# Patient Record
Sex: Female | Born: 1935 | Race: White | Hispanic: No | Marital: Married | Smoking: Never smoker
Health system: Southern US, Community
[De-identification: ages and names within clinical notes are randomized; demographics above are authoritative.]

## PROBLEM LIST (undated history)

## (undated) DIAGNOSIS — D561 Beta thalassemia: Secondary | ICD-10-CM

## (undated) DIAGNOSIS — C189 Malignant neoplasm of colon, unspecified: Secondary | ICD-10-CM

## (undated) DIAGNOSIS — I00 Rheumatic fever without heart involvement: Secondary | ICD-10-CM

## (undated) DIAGNOSIS — C4490 Unspecified malignant neoplasm of skin, unspecified: Secondary | ICD-10-CM

## (undated) DIAGNOSIS — J189 Pneumonia, unspecified organism: Secondary | ICD-10-CM

## (undated) DIAGNOSIS — F419 Anxiety disorder, unspecified: Secondary | ICD-10-CM

## (undated) DIAGNOSIS — I219 Acute myocardial infarction, unspecified: Secondary | ICD-10-CM

## (undated) DIAGNOSIS — I700 Atherosclerosis of aorta: Secondary | ICD-10-CM

## (undated) DIAGNOSIS — E785 Hyperlipidemia, unspecified: Secondary | ICD-10-CM

## (undated) DIAGNOSIS — R001 Bradycardia, unspecified: Secondary | ICD-10-CM

## (undated) DIAGNOSIS — Z87442 Personal history of urinary calculi: Secondary | ICD-10-CM

## (undated) DIAGNOSIS — M1990 Unspecified osteoarthritis, unspecified site: Secondary | ICD-10-CM

## (undated) DIAGNOSIS — I10 Essential (primary) hypertension: Secondary | ICD-10-CM

## (undated) DIAGNOSIS — N189 Chronic kidney disease, unspecified: Secondary | ICD-10-CM

## (undated) DIAGNOSIS — C801 Malignant (primary) neoplasm, unspecified: Secondary | ICD-10-CM

## (undated) DIAGNOSIS — N7092 Oophoritis, unspecified: Secondary | ICD-10-CM

## (undated) DIAGNOSIS — M810 Age-related osteoporosis without current pathological fracture: Secondary | ICD-10-CM

## (undated) DIAGNOSIS — D649 Anemia, unspecified: Secondary | ICD-10-CM

## (undated) DIAGNOSIS — I2510 Atherosclerotic heart disease of native coronary artery without angina pectoris: Secondary | ICD-10-CM

## (undated) HISTORY — DX: Age-related osteoporosis without current pathological fracture: M81.0

## (undated) HISTORY — PX: CORONARY ARTERY BYPASS GRAFT: SHX141

## (undated) HISTORY — PX: VAGINAL HYSTERECTOMY: SUR661

## (undated) HISTORY — DX: Anxiety disorder, unspecified: F41.9

## (undated) HISTORY — DX: Hyperlipidemia, unspecified: E78.5

## (undated) HISTORY — DX: Essential (primary) hypertension: I10

## (undated) HISTORY — PX: FRACTURE SURGERY: SHX138

## (undated) HISTORY — DX: Malignant neoplasm of colon, unspecified: C18.9

---

## 2003-04-05 DIAGNOSIS — C569 Malignant neoplasm of unspecified ovary: Secondary | ICD-10-CM

## 2003-04-05 HISTORY — DX: Malignant neoplasm of unspecified ovary: C56.9

## 2004-01-03 ENCOUNTER — Ambulatory Visit: Payer: Self-pay | Admitting: Oncology

## 2004-02-03 ENCOUNTER — Ambulatory Visit: Payer: Self-pay | Admitting: Oncology

## 2004-03-04 ENCOUNTER — Ambulatory Visit: Payer: Self-pay | Admitting: Oncology

## 2004-04-04 ENCOUNTER — Ambulatory Visit: Payer: Self-pay | Admitting: Oncology

## 2004-05-05 ENCOUNTER — Ambulatory Visit: Payer: Self-pay | Admitting: Oncology

## 2004-06-02 ENCOUNTER — Ambulatory Visit: Payer: Self-pay | Admitting: Oncology

## 2004-07-28 ENCOUNTER — Ambulatory Visit: Payer: Self-pay | Admitting: Oncology

## 2004-09-01 ENCOUNTER — Ambulatory Visit: Payer: Self-pay | Admitting: Unknown Physician Specialty

## 2004-09-20 ENCOUNTER — Ambulatory Visit: Payer: Self-pay | Admitting: Oncology

## 2004-11-17 ENCOUNTER — Ambulatory Visit: Payer: Self-pay | Admitting: Oncology

## 2004-12-03 ENCOUNTER — Ambulatory Visit: Payer: Self-pay | Admitting: Oncology

## 2005-01-06 ENCOUNTER — Ambulatory Visit: Payer: Self-pay | Admitting: Family Medicine

## 2005-01-17 ENCOUNTER — Ambulatory Visit: Payer: Self-pay | Admitting: Oncology

## 2005-02-02 ENCOUNTER — Ambulatory Visit: Payer: Self-pay | Admitting: Oncology

## 2005-03-04 ENCOUNTER — Ambulatory Visit: Payer: Self-pay | Admitting: Oncology

## 2005-05-11 ENCOUNTER — Ambulatory Visit: Payer: Self-pay | Admitting: Oncology

## 2005-06-02 ENCOUNTER — Ambulatory Visit: Payer: Self-pay | Admitting: Oncology

## 2005-08-08 ENCOUNTER — Ambulatory Visit: Payer: Self-pay | Admitting: Oncology

## 2005-08-10 ENCOUNTER — Ambulatory Visit: Payer: Self-pay | Admitting: Oncology

## 2005-09-02 ENCOUNTER — Ambulatory Visit: Payer: Self-pay | Admitting: Oncology

## 2005-11-07 ENCOUNTER — Ambulatory Visit: Payer: Self-pay | Admitting: Oncology

## 2005-12-03 ENCOUNTER — Ambulatory Visit: Payer: Self-pay | Admitting: Oncology

## 2006-01-26 ENCOUNTER — Ambulatory Visit: Payer: Self-pay | Admitting: Family Medicine

## 2006-02-06 ENCOUNTER — Ambulatory Visit: Payer: Self-pay | Admitting: Oncology

## 2006-02-08 ENCOUNTER — Ambulatory Visit: Payer: Self-pay | Admitting: Oncology

## 2006-03-04 ENCOUNTER — Ambulatory Visit: Payer: Self-pay | Admitting: Oncology

## 2006-04-04 DIAGNOSIS — Z951 Presence of aortocoronary bypass graft: Secondary | ICD-10-CM

## 2006-04-04 DIAGNOSIS — I219 Acute myocardial infarction, unspecified: Secondary | ICD-10-CM

## 2006-04-04 HISTORY — DX: Presence of aortocoronary bypass graft: Z95.1

## 2006-04-04 HISTORY — DX: Acute myocardial infarction, unspecified: I21.9

## 2006-04-04 HISTORY — PX: CORONARY ARTERY BYPASS GRAFT: SHX141

## 2006-04-19 ENCOUNTER — Ambulatory Visit: Payer: Self-pay | Admitting: Oncology

## 2006-05-13 ENCOUNTER — Other Ambulatory Visit: Payer: Self-pay

## 2006-05-13 ENCOUNTER — Emergency Department: Payer: Self-pay | Admitting: Emergency Medicine

## 2006-06-12 ENCOUNTER — Ambulatory Visit: Payer: Self-pay | Admitting: Oncology

## 2006-07-04 ENCOUNTER — Ambulatory Visit: Payer: Self-pay | Admitting: Oncology

## 2006-08-14 ENCOUNTER — Other Ambulatory Visit: Payer: Self-pay

## 2006-08-14 ENCOUNTER — Inpatient Hospital Stay: Payer: Self-pay | Admitting: Internal Medicine

## 2006-08-14 DIAGNOSIS — I2510 Atherosclerotic heart disease of native coronary artery without angina pectoris: Secondary | ICD-10-CM

## 2006-08-14 DIAGNOSIS — I213 ST elevation (STEMI) myocardial infarction of unspecified site: Secondary | ICD-10-CM

## 2006-08-14 HISTORY — PX: LEFT HEART CATH AND CORONARY ANGIOGRAPHY: CATH118249

## 2006-08-14 HISTORY — PX: CORONARY ARTERY BYPASS GRAFT: SHX141

## 2006-08-14 HISTORY — DX: Atherosclerotic heart disease of native coronary artery without angina pectoris: I25.10

## 2006-08-14 HISTORY — DX: ST elevation (STEMI) myocardial infarction of unspecified site: I21.3

## 2006-09-20 ENCOUNTER — Encounter: Payer: Self-pay | Admitting: Internal Medicine

## 2006-10-03 ENCOUNTER — Encounter: Payer: Self-pay | Admitting: Internal Medicine

## 2006-10-17 ENCOUNTER — Ambulatory Visit: Payer: Self-pay | Admitting: Oncology

## 2006-11-03 ENCOUNTER — Encounter: Payer: Self-pay | Admitting: Internal Medicine

## 2006-12-04 ENCOUNTER — Encounter: Payer: Self-pay | Admitting: Internal Medicine

## 2006-12-04 ENCOUNTER — Ambulatory Visit: Payer: Self-pay | Admitting: Oncology

## 2006-12-15 ENCOUNTER — Ambulatory Visit: Payer: Self-pay | Admitting: Oncology

## 2007-01-03 ENCOUNTER — Encounter: Payer: Self-pay | Admitting: Internal Medicine

## 2007-01-03 ENCOUNTER — Ambulatory Visit: Payer: Self-pay | Admitting: Oncology

## 2007-01-29 ENCOUNTER — Ambulatory Visit: Payer: Self-pay | Admitting: Oncology

## 2007-01-30 ENCOUNTER — Ambulatory Visit: Payer: Self-pay | Admitting: Oncology

## 2007-02-03 ENCOUNTER — Ambulatory Visit: Payer: Self-pay | Admitting: Oncology

## 2007-04-17 ENCOUNTER — Ambulatory Visit: Payer: Self-pay | Admitting: Oncology

## 2007-05-06 ENCOUNTER — Ambulatory Visit: Payer: Self-pay | Admitting: Oncology

## 2007-06-03 ENCOUNTER — Ambulatory Visit: Payer: Self-pay | Admitting: Oncology

## 2007-06-04 ENCOUNTER — Ambulatory Visit: Payer: Self-pay | Admitting: Oncology

## 2007-07-04 ENCOUNTER — Ambulatory Visit: Payer: Self-pay | Admitting: Oncology

## 2007-08-03 ENCOUNTER — Ambulatory Visit: Payer: Self-pay | Admitting: Oncology

## 2007-08-07 ENCOUNTER — Ambulatory Visit: Payer: Self-pay | Admitting: Nurse Practitioner

## 2007-09-03 ENCOUNTER — Ambulatory Visit: Payer: Self-pay | Admitting: Oncology

## 2007-10-03 ENCOUNTER — Ambulatory Visit: Payer: Self-pay | Admitting: Oncology

## 2007-10-09 ENCOUNTER — Ambulatory Visit: Payer: Self-pay | Admitting: Oncology

## 2007-11-03 ENCOUNTER — Ambulatory Visit: Payer: Self-pay | Admitting: Oncology

## 2007-12-31 ENCOUNTER — Ambulatory Visit: Payer: Self-pay | Admitting: Oncology

## 2008-01-03 ENCOUNTER — Ambulatory Visit: Payer: Self-pay | Admitting: Oncology

## 2008-02-03 ENCOUNTER — Ambulatory Visit: Payer: Self-pay | Admitting: Oncology

## 2008-04-08 ENCOUNTER — Ambulatory Visit: Payer: Self-pay | Admitting: Oncology

## 2008-05-05 ENCOUNTER — Ambulatory Visit: Payer: Self-pay | Admitting: Oncology

## 2008-05-05 ENCOUNTER — Ambulatory Visit: Payer: Self-pay | Admitting: Gynecologic Oncology

## 2008-09-02 ENCOUNTER — Ambulatory Visit: Payer: Self-pay | Admitting: Oncology

## 2008-09-23 ENCOUNTER — Ambulatory Visit: Payer: Self-pay | Admitting: Oncology

## 2008-10-02 ENCOUNTER — Ambulatory Visit: Payer: Self-pay | Admitting: Oncology

## 2008-12-03 ENCOUNTER — Ambulatory Visit: Payer: Self-pay | Admitting: Gynecologic Oncology

## 2008-12-22 ENCOUNTER — Ambulatory Visit: Payer: Self-pay | Admitting: Gynecologic Oncology

## 2009-01-02 ENCOUNTER — Ambulatory Visit: Payer: Self-pay | Admitting: Gynecologic Oncology

## 2009-02-05 ENCOUNTER — Ambulatory Visit: Payer: Self-pay | Admitting: Family Medicine

## 2009-02-05 ENCOUNTER — Ambulatory Visit: Payer: Self-pay | Admitting: Oncology

## 2009-03-04 ENCOUNTER — Ambulatory Visit: Payer: Self-pay | Admitting: Oncology

## 2009-04-04 ENCOUNTER — Ambulatory Visit: Payer: Self-pay | Admitting: Oncology

## 2009-04-04 HISTORY — PX: COLONOSCOPY: SHX174

## 2009-05-20 ENCOUNTER — Ambulatory Visit: Payer: Self-pay | Admitting: Ophthalmology

## 2009-06-02 ENCOUNTER — Ambulatory Visit: Payer: Self-pay | Admitting: Ophthalmology

## 2009-07-22 ENCOUNTER — Ambulatory Visit: Payer: Self-pay | Admitting: Ophthalmology

## 2009-08-04 ENCOUNTER — Ambulatory Visit: Payer: Self-pay | Admitting: Ophthalmology

## 2009-09-08 ENCOUNTER — Ambulatory Visit: Payer: Self-pay | Admitting: Oncology

## 2009-10-02 ENCOUNTER — Ambulatory Visit: Payer: Self-pay | Admitting: Oncology

## 2009-12-03 ENCOUNTER — Ambulatory Visit: Payer: Self-pay | Admitting: Gynecologic Oncology

## 2009-12-14 ENCOUNTER — Emergency Department: Payer: Self-pay | Admitting: Unknown Physician Specialty

## 2009-12-22 ENCOUNTER — Ambulatory Visit: Payer: Self-pay | Admitting: Gynecologic Oncology

## 2010-01-02 ENCOUNTER — Ambulatory Visit: Payer: Self-pay | Admitting: Gynecologic Oncology

## 2010-03-22 ENCOUNTER — Ambulatory Visit: Payer: Self-pay | Admitting: Oncology

## 2010-03-23 ENCOUNTER — Ambulatory Visit: Payer: Self-pay | Admitting: Gynecologic Oncology

## 2010-04-04 ENCOUNTER — Ambulatory Visit: Payer: Self-pay | Admitting: Gynecologic Oncology

## 2010-09-08 ENCOUNTER — Ambulatory Visit: Payer: Self-pay | Admitting: Oncology

## 2010-09-09 LAB — CA 125: CA 125: 27.7 U/mL (ref 0.0–34.0)

## 2010-10-03 ENCOUNTER — Ambulatory Visit: Payer: Self-pay | Admitting: Oncology

## 2010-12-14 ENCOUNTER — Ambulatory Visit: Payer: Self-pay | Admitting: Family Medicine

## 2011-03-22 ENCOUNTER — Ambulatory Visit: Payer: Self-pay | Admitting: Oncology

## 2011-03-24 LAB — CA 125: CA 125: 25.2 U/mL (ref 0.0–34.0)

## 2011-04-05 ENCOUNTER — Ambulatory Visit: Payer: Self-pay | Admitting: Oncology

## 2011-09-16 ENCOUNTER — Ambulatory Visit: Payer: Self-pay | Admitting: Oncology

## 2011-09-23 LAB — COMPREHENSIVE METABOLIC PANEL
Albumin: 3.6 g/dL (ref 3.4–5.0)
Anion Gap: 7 (ref 7–16)
Calcium, Total: 9.4 mg/dL (ref 8.5–10.1)
Chloride: 106 mmol/L (ref 98–107)
Co2: 28 mmol/L (ref 21–32)
Creatinine: 1.04 mg/dL (ref 0.60–1.30)
EGFR (African American): >60
EGFR (Non-African Amer.): 53 — ABNORMAL LOW
Glucose: 101 mg/dL — ABNORMAL HIGH (ref 65–99)
Osmolality: 284 (ref 275–301)
Potassium: 4.5 mmol/L (ref 3.5–5.1)
SGOT(AST): 17 U/L (ref 15–37)
SGPT (ALT): 24 U/L
Sodium: 141 mmol/L (ref 136–145)
Total Protein: 7.6 g/dL (ref 6.4–8.2)

## 2011-09-23 LAB — CBC CANCER CENTER
Basophil %: 0.8 %
Eosinophil #: 0.2 x10 3/mm (ref 0.0–0.7)
Eosinophil %: 2.5 %
HGB: 11.5 g/dL — ABNORMAL LOW (ref 12.0–16.0)
Lymphocyte %: 25.2 %
MCHC: 30.7 g/dL — ABNORMAL LOW (ref 32.0–36.0)
Monocyte #: 1 x10 3/mm — ABNORMAL HIGH (ref 0.2–0.9)
Neutrophil #: 5.9 x10 3/mm (ref 1.4–6.5)
Neutrophil %: 61.6 %
Platelet: 220 x10 3/mm (ref 150–440)
RBC: 5.53 10*6/uL — ABNORMAL HIGH (ref 3.80–5.20)
WBC: 9.6 x10 3/mm (ref 3.6–11.0)

## 2011-09-24 LAB — CA 125: CA 125: 24.6 U/mL (ref 0.0–34.0)

## 2011-10-03 ENCOUNTER — Ambulatory Visit: Payer: Self-pay | Admitting: Oncology

## 2012-01-12 ENCOUNTER — Ambulatory Visit: Payer: Self-pay | Admitting: Family Medicine

## 2012-03-20 ENCOUNTER — Ambulatory Visit: Payer: Self-pay | Admitting: Oncology

## 2012-03-20 LAB — CBC CANCER CENTER
Basophil #: 0.1 x10 3/mm (ref 0.0–0.1)
Eosinophil #: 0.2 x10 3/mm (ref 0.0–0.7)
HCT: 37.1 % (ref 35.0–47.0)
HGB: 11.7 g/dL — ABNORMAL LOW (ref 12.0–16.0)
Lymphocyte #: 2.5 x10 3/mm (ref 1.0–3.6)
Lymphocyte %: 26.4 %
MCH: 21.5 pg — ABNORMAL LOW (ref 26.0–34.0)
MCHC: 31.6 g/dL — ABNORMAL LOW (ref 32.0–36.0)
MCV: 68 fL — ABNORMAL LOW (ref 80–100)
Monocyte %: 10.6 %
Platelet: 223 x10 3/mm (ref 150–440)
RDW: 16.3 % — ABNORMAL HIGH (ref 11.5–14.5)

## 2012-03-20 LAB — COMPREHENSIVE METABOLIC PANEL
Anion Gap: 9 (ref 7–16)
Bilirubin,Total: 0.6 mg/dL (ref 0.2–1.0)
Calcium, Total: 9 mg/dL (ref 8.5–10.1)
Chloride: 106 mmol/L (ref 98–107)
Co2: 29 mmol/L (ref 21–32)
Creatinine: 1.01 mg/dL (ref 0.60–1.30)
EGFR (African American): >60
EGFR (Non-African Amer.): 55 — ABNORMAL LOW
SGPT (ALT): 30 U/L (ref 12–78)
Sodium: 144 mmol/L (ref 136–145)

## 2012-03-22 LAB — CA 125: CA 125: 24.8 U/mL (ref 0.0–34.0)

## 2012-04-04 ENCOUNTER — Ambulatory Visit: Payer: Self-pay | Admitting: Oncology

## 2012-06-20 ENCOUNTER — Ambulatory Visit: Payer: Self-pay

## 2012-06-22 LAB — CBC CANCER CENTER
Basophil %: 0.8 %
HCT: 38.2 % (ref 35.0–47.0)
HGB: 11.5 g/dL — ABNORMAL LOW (ref 12.0–16.0)
Lymphocyte %: 27.7 %
MCHC: 30.2 g/dL — ABNORMAL LOW (ref 32.0–36.0)
MCV: 66 fL — ABNORMAL LOW (ref 80–100)
Monocyte #: 0.6 x10 3/mm (ref 0.2–0.9)
Monocyte %: 6.6 %
RBC: 5.79 10*6/uL — ABNORMAL HIGH (ref 3.80–5.20)
RDW: 16.3 % — ABNORMAL HIGH (ref 11.5–14.5)

## 2012-06-22 LAB — COMPREHENSIVE METABOLIC PANEL
Albumin: 3.4 g/dL (ref 3.4–5.0)
Alkaline Phosphatase: 98 U/L (ref 50–136)
Anion Gap: 10 (ref 7–16)
BUN: 16 mg/dL (ref 7–18)
Calcium, Total: 9.2 mg/dL (ref 8.5–10.1)
Chloride: 103 mmol/L (ref 98–107)
Co2: 30 mmol/L (ref 21–32)
Creatinine: 1.19 mg/dL (ref 0.60–1.30)
EGFR (Non-African Amer.): 45 — ABNORMAL LOW
Potassium: 4.4 mmol/L (ref 3.5–5.1)
Sodium: 143 mmol/L (ref 136–145)
Total Protein: 7.4 g/dL (ref 6.4–8.2)

## 2012-06-23 LAB — CA 125: CA 125: 28.5 U/mL (ref 0.0–34.0)

## 2012-07-03 ENCOUNTER — Ambulatory Visit: Payer: Self-pay | Admitting: Oncology

## 2012-07-03 ENCOUNTER — Ambulatory Visit: Payer: Self-pay

## 2012-11-01 ENCOUNTER — Ambulatory Visit: Payer: Self-pay | Admitting: Family Medicine

## 2013-03-26 ENCOUNTER — Ambulatory Visit: Payer: Self-pay | Admitting: Oncology

## 2013-04-04 ENCOUNTER — Ambulatory Visit: Payer: Self-pay | Admitting: Oncology

## 2014-02-19 ENCOUNTER — Ambulatory Visit: Payer: Self-pay | Admitting: Family Medicine

## 2014-05-07 ENCOUNTER — Ambulatory Visit: Payer: Self-pay | Admitting: Oncology

## 2014-06-03 ENCOUNTER — Ambulatory Visit
Admit: 2014-06-03 | Disposition: A | Discharge status: Home / Self Care | Payer: Self-pay | Attending: Oncology | Admitting: Oncology

## 2014-08-11 ENCOUNTER — Encounter: Payer: Self-pay | Admitting: Emergency Medicine

## 2014-08-11 ENCOUNTER — Emergency Department: Payer: Medicare Other

## 2014-08-11 ENCOUNTER — Observation Stay
Admission: EM | Admit: 2014-08-11 | Discharge: 2014-08-12 | Disposition: A | Discharge status: Home / Self Care | Payer: Medicare Other | Attending: Specialist | Admitting: Specialist

## 2014-08-11 DIAGNOSIS — Z951 Presence of aortocoronary bypass graft: Secondary | ICD-10-CM | POA: Diagnosis not present

## 2014-08-11 DIAGNOSIS — I25810 Atherosclerosis of coronary artery bypass graft(s) without angina pectoris: Secondary | ICD-10-CM | POA: Diagnosis not present

## 2014-08-11 DIAGNOSIS — R9431 Abnormal electrocardiogram [ECG] [EKG]: Secondary | ICD-10-CM | POA: Diagnosis present

## 2014-08-11 DIAGNOSIS — Z8543 Personal history of malignant neoplasm of ovary: Secondary | ICD-10-CM | POA: Diagnosis not present

## 2014-08-11 DIAGNOSIS — Z79899 Other long term (current) drug therapy: Secondary | ICD-10-CM | POA: Diagnosis not present

## 2014-08-11 DIAGNOSIS — I10 Essential (primary) hypertension: Secondary | ICD-10-CM | POA: Diagnosis not present

## 2014-08-11 DIAGNOSIS — E860 Dehydration: Secondary | ICD-10-CM | POA: Diagnosis not present

## 2014-08-11 DIAGNOSIS — R079 Chest pain, unspecified: Secondary | ICD-10-CM | POA: Diagnosis present

## 2014-08-11 DIAGNOSIS — I252 Old myocardial infarction: Secondary | ICD-10-CM | POA: Insufficient documentation

## 2014-08-11 DIAGNOSIS — Z7982 Long term (current) use of aspirin: Secondary | ICD-10-CM | POA: Diagnosis not present

## 2014-08-11 DIAGNOSIS — Z66 Do not resuscitate: Secondary | ICD-10-CM | POA: Diagnosis not present

## 2014-08-11 HISTORY — DX: Acute myocardial infarction, unspecified: I21.9

## 2014-08-11 HISTORY — DX: Rheumatic fever without heart involvement: I00

## 2014-08-11 HISTORY — DX: Malignant (primary) neoplasm, unspecified: C80.1

## 2014-08-11 LAB — CBC
HCT: 38.5 % (ref 35.0–47.0)
HEMOGLOBIN: 11.9 g/dL — AB (ref 12.0–16.0)
MCH: 21.3 pg — ABNORMAL LOW (ref 26.0–34.0)
MCHC: 30.9 g/dL — AB (ref 32.0–36.0)
MCV: 68.7 fL — ABNORMAL LOW (ref 80.0–100.0)
PLATELETS: 253 10*3/uL (ref 150–440)
RBC: 5.6 MIL/uL — ABNORMAL HIGH (ref 3.80–5.20)
RDW: 16.1 % — ABNORMAL HIGH (ref 11.5–14.5)
WBC: 11.4 10*3/uL — ABNORMAL HIGH (ref 3.6–11.0)

## 2014-08-11 LAB — BASIC METABOLIC PANEL
ANION GAP: 9 (ref 5–15)
BUN: 21 mg/dL — AB (ref 6–20)
CO2: 28 mmol/L (ref 22–32)
CREATININE: 0.97 mg/dL (ref 0.44–1.00)
Calcium: 9.7 mg/dL (ref 8.9–10.3)
Chloride: 103 mmol/L (ref 101–111)
GFR, EST NON AFRICAN AMERICAN: 55 mL/min — AB (ref >60)
Glucose, Bld: 165 mg/dL — ABNORMAL HIGH (ref 65–99)
POTASSIUM: 4.5 mmol/L (ref 3.5–5.1)
Sodium: 140 mmol/L (ref 135–145)

## 2014-08-11 LAB — CREATININE, SERUM
Creatinine, Ser: 0.87 mg/dL (ref 0.44–1.00)
GFR calc Af Amer: >60 mL/min (ref >60)
GFR calc non Af Amer: >60 mL/min (ref >60)

## 2014-08-11 LAB — TROPONIN I
Troponin I: <0.03 ng/mL (ref <0.031)
Troponin I: <0.03 ng/mL (ref <0.031)

## 2014-08-11 MED ORDER — ACETAMINOPHEN 650 MG RE SUPP: 650.0000 mg | Freq: Four times a day (QID) | RECTAL | Status: DC | PRN

## 2014-08-11 MED ORDER — METOPROLOL TARTRATE 25 MG PO TABS
ORAL_TABLET | ORAL | Status: AC
  Filled 2014-08-11: qty 1

## 2014-08-11 MED ORDER — LISINOPRIL 10 MG PO TABS
10.0000 mg | ORAL_TABLET | Freq: Every day | ORAL | Status: DC
  Administered 2014-08-11: 10 mg via ORAL

## 2014-08-11 MED ORDER — ONDANSETRON HCL 4 MG PO TABS: 4.0000 mg | ORAL_TABLET | Freq: Four times a day (QID) | ORAL | Status: DC | PRN

## 2014-08-11 MED ORDER — ATORVASTATIN CALCIUM 20 MG PO TABS
80.0000 mg | ORAL_TABLET | Freq: Every day | ORAL | Status: DC
  Administered 2014-08-12: 80 mg via ORAL
  Filled 2014-08-11: qty 4

## 2014-08-11 MED ORDER — METOPROLOL TARTRATE 25 MG PO TABS
25.0000 mg | ORAL_TABLET | Freq: Two times a day (BID) | ORAL | Status: DC
  Administered 2014-08-11: 25 mg via ORAL

## 2014-08-11 MED ORDER — ASPIRIN 81 MG PO CHEW
324.0000 mg | CHEWABLE_TABLET | Freq: Once | ORAL | Status: AC
  Administered 2014-08-11: 324 mg via ORAL

## 2014-08-11 MED ORDER — ONDANSETRON HCL 4 MG/2ML IJ SOLN: 4.0000 mg | Freq: Four times a day (QID) | INTRAMUSCULAR | Status: DC | PRN

## 2014-08-11 MED ORDER — LISINOPRIL 20 MG PO TABS
10.0000 mg | ORAL_TABLET | Freq: Once | ORAL | Status: AC
  Administered 2014-08-11: 10 mg via ORAL

## 2014-08-11 MED ORDER — LISINOPRIL 20 MG PO TABS
ORAL_TABLET | ORAL | Status: AC
  Administered 2014-08-11: 10 mg via ORAL
  Filled 2014-08-11: qty 1

## 2014-08-11 MED ORDER — ASPIRIN 81 MG PO CHEW
CHEWABLE_TABLET | ORAL | Status: AC
  Administered 2014-08-11: 324 mg via ORAL
  Filled 2014-08-11: qty 4

## 2014-08-11 MED ORDER — ASPIRIN 325 MG PO TABS
325.0000 mg | ORAL_TABLET | Freq: Every day | ORAL | Status: DC
  Filled 2014-08-11: qty 1

## 2014-08-11 MED ORDER — ALBUTEROL SULFATE (2.5 MG/3ML) 0.083% IN NEBU: 2.5000 mg | INHALATION_SOLUTION | RESPIRATORY_TRACT | Status: DC | PRN

## 2014-08-11 MED ORDER — ASPIRIN EC 81 MG PO TBEC: 81.0000 mg | DELAYED_RELEASE_TABLET | Freq: Every day | ORAL | Status: DC

## 2014-08-11 MED ORDER — HEPARIN SODIUM (PORCINE) 5000 UNIT/ML IJ SOLN
5000.0000 [IU] | Freq: Three times a day (TID) | INTRAMUSCULAR | Status: DC
  Administered 2014-08-11 – 2014-08-12 (×2): 5000 [IU] via SUBCUTANEOUS
  Filled 2014-08-11 (×2): qty 1

## 2014-08-11 MED ORDER — ACETAMINOPHEN 325 MG PO TABS: 650.0000 mg | ORAL_TABLET | Freq: Four times a day (QID) | ORAL | Status: DC | PRN

## 2014-08-11 MED ORDER — HYDRALAZINE HCL 20 MG/ML IJ SOLN
10.0000 mg | INTRAMUSCULAR | Status: DC | PRN
  Filled 2014-08-11: qty 1

## 2014-08-11 MED ORDER — LISINOPRIL 10 MG PO TABS
10.0000 mg | ORAL_TABLET | Freq: Every day | ORAL | Status: DC
  Administered 2014-08-12: 10 mg via ORAL
  Filled 2014-08-11: qty 1

## 2014-08-11 MED ORDER — LISINOPRIL 20 MG PO TABS
ORAL_TABLET | ORAL | Status: AC
  Filled 2014-08-11: qty 1

## 2014-08-11 MED ORDER — SIMVASTATIN 40 MG PO TABS: 40.0000 mg | ORAL_TABLET | Freq: Every day | ORAL | Status: DC

## 2014-08-11 NOTE — H&P (Signed)
[facility] [place] NAME: Phyllis Ross    MR#:  [id number]  DATE OF BIRTH:  October 18, 1935  DATE OF ADMISSION:  08/11/2014  PRIMARY CARE PHYSICIAN: Otilio Miu, MD   REQUESTING/REFERRING PHYSICIAN: Dr. Jacqualine Code.  CHIEF COMPLAINT:   Chief Complaint  Patient presents with  . Chest Pain    chest pain began 11am while playing pickle ball, continues since    HISTORY OF PRESENT ILLNESS:  Phyllis Ross  is a 79 y.o. female with a known history of hypertension, CAD status post CABG, ovarian cancer. Patient was sent to the ED due to chest pain today. Patient started to have chest pain will playing picckle ball this morning. The chest pain was aching without radiation and lasted about 20 minutes. The patient denies any headache, nausea, vomiting or diaphoresis. The patient has high blood pressure more than 200, was treated with the aspirin 324 mg 1 dose and lisinopril 10 mg 1 dose in the ED. The patient denies any other symptoms.  PAST MEDICAL HISTORY:   Past Medical History  Diagnosis Date  . Cancer   . MI (myocardial infarction)   . Rheumatic fever   ovarian cancer.   PAST SURGICAL HISTORY:   Past Surgical History  Procedure Laterality Date  . Coronary artery bypass graft     ovarian cancer surgery.  SOCIAL HISTORY:   History  Substance Use Topics  . Smoking status: Never Smoker   . Smokeless tobacco: Not on file  . Alcohol Use: No    FAMILY HISTORY:  No family history on file.  The patient and mother and sister has a heart disease, her mother had a heart attack.  DRUG ALLERGIES:  No Known Allergies  REVIEW OF SYSTEMS:  CONSTITUTIONAL: No fever, fatigue or weakness.  EYES: No blurred or double vision.  EARS, NOSE, AND THROAT: No tinnitus or ear pain.  RESPIRATORY: No cough, shortness of breath, wheezing or hemoptysis.  CARDIOVASCULAR:  positive for chest pain, orthopnea, edema.  GASTROINTESTINAL: No nausea,  vomiting, diarrhea or abdominal pain.  GENITOURINARY: No dysuria, hematuria.  ENDOCRINE: No polyuria, nocturia,  HEMATOLOGY: No anemia, easy bruising or bleeding SKIN: No rash or lesion. MUSCULOSKELETAL: No joint pain or arthritis.   NEUROLOGIC: No tingling, numbness, weakness.  PSYCHIATRY: No anxiety or depression.   MEDICATIONS AT HOME:   Prior to Admission medications   Medication Sig Start Date End Date Taking? Authorizing Provider  aspirin EC 81 MG tablet Take 81 mg by mouth daily.   Yes Historical Provider, MD  atorvastatin (LIPITOR) 80 MG tablet Take 80 mg by mouth daily.   Yes Historical Provider, MD  lisinopril (PRINIVIL,ZESTRIL) 10 MG tablet Take 10 mg by mouth daily.   Yes Historical Provider, MD  metoprolol tartrate (LOPRESSOR) 25 MG tablet Take 25 mg by mouth 2 (two) times daily.   Yes Historical Provider, MD      VITAL SIGNS:  Blood pressure 214/82, pulse 56, temperature 97.4 F (36.3 C), resp. rate 18, height 5\' 6"  (1.676 m), weight 61.236 kg (135 lb), SpO2 95 %.  PHYSICAL EXAMINATION:  GENERAL:  79 y.o.-year-old patient lying in the bed with no acute distress.  EYES: Pupils equal, round, reactive to light and accommodation. No scleral icterus. Extraocular muscles intact.  HEENT: Head atraumatic, normocephalic. Oropharynx and nasopharynx clear.  NECK:  Supple, no jugular venous distention. No thyroid enlargement, no tenderness.  LUNGS: Normal breath sounds bilaterally, no wheezing, rales,rhonchi or crepitation. No use of  accessory muscles of respiration.  CARDIOVASCULAR: S1, S2 normal. No murmurs, rubs, or gallops.  ABDOMEN: Soft, nontender, nondistended. Bowel sounds present. No organomegaly or mass.  EXTREMITIES: No pedal edema, cyanosis, or clubbing.  NEUROLOGIC: Cranial nerves II through XII are intact. Muscle strength 5/5 in all extremities. Sensation intact. Gait not checked.  PSYCHIATRIC: The patient is alert and oriented x 3.  SKIN: No obvious rash, lesion,  or ulcer.   LABORATORY PANEL:   CBC No results for input(s): WBC, HGB, HCT, PLT in the last 168 hours. ------------------------------------------------------------------------------------------------------------------  Chemistries   Recent Labs Lab 08/11/14 1430  NA 140  K 4.5  CL 103  CO2 28  GLUCOSE 165*  BUN 21*  CREATININE 0.97  CALCIUM 9.7   ------------------------------------------------------------------------------------------------------------------  Cardiac Enzymes  Recent Labs Lab 08/11/14 1430  TROPONINI <0.03   ------------------------------------------------------------------------------------------------------------------  RADIOLOGY:  No results found.  EKG:   Orders placed or performed during the hospital encounter of 08/11/14  . ED EKG (<29mins upon arrival to the ED)  . ED EKG (<28mins upon arrival to the ED)    IMPRESSION AND PLAN:   Malignant hypertension. The patient will be placed for observation. I will start IV hydralazine in when necessary, continue lisinopril and Lopressor, and adjust the dose depending on patient's blood pressure.  Chest pain and a history of CAD. The chest pain is possibly due to malignant hypertension. Patient first troponin level is negative, I will check a second troponin level and continue aspirin and atorvastatin. Mild dehydration. Encouraged patient to take oral fluids intake.     All the records are reviewed and case discussed with ED provider. Management plans discussed with the patient, family and they are in agreement.  CODE STATUS: Full code.  TOTAL TIME TAKING CARE OF THIS PATIENT: 56 minutes.    Demetrios Loll M.D on 08/11/2014 at 7:18 PM  Between 7am to 6pm - Pager - [phone]  After 6pm go to [email or web address] - password [email or web address] [email or web address]  [facility]  Office  [phone]  CC: Primary care physician; Otilio Miu, MD

## 2014-08-11 NOTE — ED Notes (Signed)
Pt states she was playing pickle ball earlier today and began to feel pain in left chest that radiated toward her arm and back. States she has hx of MI in 2008 and this pain is different. Pt states she is not experiencing pain at this time and that pain is intermittent.

## 2014-08-11 NOTE — ED Notes (Signed)
Chest pain began at 11am while playing pickle ball, continued to play 2 games and when pain did not subside stopped by fire department, they evaluated her and sent her by private car

## 2014-08-11 NOTE — ED Provider Notes (Signed)
[facility] Emergency Department Provider Note  ____________________________________________  Time seen: Approximately 5:43 PM  I have reviewed the triage vital signs and the nursing notes.   HISTORY  Chief Complaint Chest Pain    HPI Phyllis Ross is a 79 y.o. female who developed chest pain after playing one hour of pickle ball today. She developed an achy, pressure in her left chest. It was moderate in intensity and nonradiating, it improved after approximately 20 minutes of rest. She currently states she has a very mild sensation of pressure in the left chest, but it is much improved. Sudden onset. No recent surgeries. Patient has had a history of coronary bypass several years ago.  There is no associated nausea, vomiting, diaphoresis, or shortness of breath.  She has not taken her lisinopril yet today, and has not taken any aspirin.  Past Medical History  Diagnosis Date  . Cancer   . MI (myocardial infarction)   . Rheumatic fever     There are no active problems to display for this patient.   Past Surgical History  Procedure Laterality Date  . Coronary artery bypass graft      No current outpatient prescriptions on file.  Allergies Review of patient's allergies indicates no known allergies.  No family history on file.  Social History History  Substance Use Topics  . Smoking status: Never Smoker   . Smokeless tobacco: Not on file  . Alcohol Use: No    Review of Systems Constitutional: No fever/chills Eyes: No visual changes. ENT: No sore throat. Cardiovascular: See history of present illness Respiratory: Denies shortness of breath. Gastrointestinal: No abdominal pain.  No nausea, no vomiting.  No diarrhea.  No constipation. Genitourinary: Negative for dysuria. Musculoskeletal: Negative for back pain. Skin: Negative for rash. Neurological: Negative for headaches, focal weakness or numbness.  10-point ROS otherwise  negative.  ____________________________________________   PHYSICAL EXAM:  VITAL SIGNS: ED Triage Vitals  Enc Vitals Group     BP 08/11/14 1425 206/73 mmHg     Pulse Rate 08/11/14 1425 56     Resp 08/11/14 1425 18     Temp 08/11/14 1425 97.4 F (36.3 C)     Temp src --      SpO2 08/11/14 1425 95 %     Weight 08/11/14 1425 135 lb (61.236 kg)     Height 08/11/14 1425 5\' 6"  (1.676 m)     Head Cir --      Peak Flow --      Pain Score 08/11/14 1426 5     Pain Loc --      Pain Edu? --      Excl. in [place]? --     Constitutional: Alert and oriented. Well appearing and in no acute distress. Eyes: Conjunctivae are normal. PERRL. EOMI. Head: Atraumatic. Nose: No congestion/rhinnorhea. Mouth/Throat: Mucous membranes are moist.  Oropharynx non-erythematous. Neck: No stridor.   Cardiovascular: Normal rate, regular rhythm. Grossly normal heart sounds.  Good peripheral circulation. Respiratory: Normal respiratory effort.  No retractions. Lungs CTAB. Gastrointestinal: Soft and nontender. No distention. No abdominal bruits. No CVA tenderness. Musculoskeletal: No lower extremity tenderness nor edema.  No joint effusions. Neurologic:  Normal speech and language. No gross focal neurologic deficits are appreciated. Speech is normal. No gait instability. Skin:  Skin is warm, dry and intact. No rash noted. There are multiple "sun spots", I discussed this with the patient and she sees dermatology for regular follow-up. Psychiatric: Mood and affect are normal.  Speech and behavior are normal.  ____________________________________________   LABS (all labs ordered are listed, but only abnormal results are displayed)  Labs Reviewed  BASIC METABOLIC PANEL - Abnormal; Notable for the following:    Glucose, Bld 165 (*)    BUN 21 (*)    GFR calc non Af Amer 55 (*)    All other components within normal limits  TROPONIN I   ____________________________________________  EKG  Sinus bradycardia rate  of 54 there is T-wave depression notably an inferolateral distribution. This is concerning for ischemia, but does not indicate ST elevation MI.  There is no old EKG for comparison. PR interval 180 QRS 88 QTc 440.  Abnormal EKG. ____________________________________________  RADIOLOGY  Pending ____________________________________________   PROCEDURES  Procedure(s) performed: None  Critical Care performed: No  ____________________________________________   INITIAL IMPRESSION / ASSESSMENT AND PLAN / ED COURSE  Pertinent labs & imaging results that were available during my care of the patient were reviewed by me and considered in my medical decision making (see chart for details).  Chest pain, concerning for potential acute coronary etiology. EKG is abnormal, first troponin is normal, patient's pain is much improved. I discussed with Dr. Josefa Half who recommends admission, no recommendation for anticoagulation at present. Will give aspirin, antihypertensive, and admitted to the hospital for further evaluation of chest pain and provocative testing. ____________________________________________   FINAL CLINICAL IMPRESSION(S) / ED DIAGNOSES  Final diagnoses:  None   Chest pain, initial, acute with known coronary artery disease and risk factors.   Delman Kitten, MD 08/11/14 [phone]

## 2014-08-11 NOTE — ED Notes (Signed)
Family at bedside. 

## 2014-08-12 LAB — LIPID PANEL
Cholesterol: 177 mg/dL (ref 0–200)
HDL: 38 mg/dL — ABNORMAL LOW (ref >40)
LDL Cholesterol: 111 mg/dL — ABNORMAL HIGH (ref 0–99)
Total CHOL/HDL Ratio: 4.7 RATIO
Triglycerides: 138 mg/dL (ref <150)
VLDL: 28 mg/dL (ref 0–40)

## 2014-08-12 MED ORDER — SODIUM CHLORIDE 0.9 % IJ SOLN
3.0000 mL | Freq: Two times a day (BID) | INTRAMUSCULAR | Status: DC
  Administered 2014-08-12 (×2): 3 mL via INTRAVENOUS

## 2014-08-12 MED ORDER — SODIUM CHLORIDE 0.9 % IJ SOLN: 3.0000 mL | INTRAMUSCULAR | Status: DC | PRN

## 2014-08-12 NOTE — Discharge Instructions (Signed)
°  DIET:  °Cardiac diet ° °DISCHARGE CONDITION:  °Good ° °ACTIVITY:  °Activity as tolerated ° °OXYGEN:  °Home Oxygen: No. °  °Oxygen Delivery: room air ° °DISCHARGE LOCATION:  °home  ° °If you experience worsening of your admission symptoms, develop shortness of breath, life threatening emergency, suicidal or homicidal thoughts you must seek medical attention immediately by calling 911 or calling your MD immediately  if symptoms less severe. ° °You Must read complete instructions/literature along with all the possible adverse reactions/side effects for all the Medicines you take and that have been prescribed to you. Take any new Medicines after you have completely understood and accpet all the possible adverse reactions/side effects.  ° °Please note ° °You were cared for by a hospitalist during your hospital stay. If you have any questions about your discharge medications or the care you received while you were in the hospital after you are discharged, you can call the unit and asked to speak with the hospitalist on call if the hospitalist that took care of you is not available. Once you are discharged, your primary care physician will handle any further medical issues. Please note that NO REFILLS for any discharge medications will be authorized once you are discharged, as it is imperative that you return to your primary care physician (or establish a relationship with a primary care physician if you do not have one) for your aftercare needs so that they can reassess your need for medications and monitor your lab values. ° ° ° °

## 2014-08-12 NOTE — Progress Notes (Signed)
Pt discharged to home with husband and daughter.  No verbal complaints of pain or discomfort.  Discharge instructions given to the family and verbalized understanding.  Return appt made and given to the pt.  IV d/c, tele monitor d/c.

## 2014-08-12 NOTE — Discharge Summary (Signed)
[place] [facility] [place] NAME: Phyllis Ross    MR#:  [id number]  DATE OF BIRTH:  1936-02-14  DATE OF ADMISSION:  08/11/2014 ADMITTING PHYSICIAN: Demetrios Loll, MD  DATE OF DISCHARGE: 08/12/2014 12:45 PM  PRIMARY CARE PHYSICIAN: Otilio Miu, MD    ADMISSION DIAGNOSIS:  EKG abnormalities [R94.31] Chest pain on exertion [R07.9]  DISCHARGE DIAGNOSIS:  Active Problems:   HTN (hypertension), malignant   Chest pain   CAD (coronary artery disease) of artery bypass graft   SECONDARY DIAGNOSIS:   Past Medical History  Diagnosis Date  . Cancer   . MI (myocardial infarction)   . Rheumatic fever     HOSPITAL COURSE:   79 -year-old female with past medical history of hypertension coronary disease status post bypass ovarian cancer who presented to the hospital with chest pain  #1 chest pain patient was observed overnight on telemetry had 3 sets of cardiac markers checked which were negative pain was more in the shoulder side that was likely musculoskeletal in nature she is currently stable therefore she is being discharged home  #2 bradycardia this was sinus bradycardia likely secondary to beta blockers patient's heart rate was noted to be as low in the 40s although she remained hemodynamically stable she was on metoprolol twice daily which was discontinued she will continue follow-up with a cardiologist Dr. Nehemiah Massed as an outpatient  #3 hypertension patient remained hemostatic stable with no evidence of orthostasis she will continue her lisinopril  #4 ovarian cancer this is currently in remission  DISCHARGE CONDITIONS:   Stable  CONSULTS OBTAINED:     DRUG ALLERGIES:  No Known Allergies  DISCHARGE MEDICATIONS:   Discharge Medication List as of 08/12/2014 12:00 PM    CONTINUE these medications which have NOT CHANGED   Details  aspirin EC 81 MG tablet Take 81 mg by mouth daily., Until Discontinued, Historical Med     atorvastatin (LIPITOR) 80 MG tablet Take 80 mg by mouth daily., Until Discontinued, Historical Med    lisinopril (PRINIVIL,ZESTRIL) 10 MG tablet Take 10 mg by mouth daily., Until Discontinued, Historical Med      STOP taking these medications     metoprolol tartrate (LOPRESSOR) 25 MG tablet          DISCHARGE INSTRUCTIONS:   DIET:  Cardiac diet  DISCHARGE CONDITION:  Good  ACTIVITY:  Activity as tolerated  OXYGEN:  Home Oxygen: No.   Oxygen Delivery: room air  DISCHARGE LOCATION:  home   If you experience worsening of your admission symptoms, develop shortness of breath, life threatening emergency, suicidal or homicidal thoughts you must seek medical attention immediately by calling 911 or calling your MD immediately  if symptoms less severe.  You Must read complete instructions/literature along with all the possible adverse reactions/side effects for all the Medicines you take and that have been prescribed to you. Take any new Medicines after you have completely understood and accpet all the possible adverse reactions/side effects.   Please note  You were cared for by a hospitalist during your hospital stay. If you have any questions about your discharge medications or the care you received while you were in the hospital after you are discharged, you can call the unit and asked to speak with the hospitalist on call if the hospitalist that took care of you is not available. Once you are discharged, your primary care physician will handle any further medical issues. Please note that NO REFILLS for any  discharge medications will be authorized once you are discharged, as it is imperative that you return to your primary care physician (or establish a relationship with a primary care physician if you do not have one) for your aftercare needs so that they can reassess your need for medications and monitor your lab values.     Today    VITAL SIGNS:  Blood pressure 161/63,  pulse 50, temperature 98 F (36.7 C), temperature source Oral, resp. rate 22, height 5\' 6"  (1.676 m), weight 62.143 kg (137 lb), SpO2 99 %.  I/O:   Intake/Output Summary (Last 24 hours) at 08/12/14 1557 Last data filed at 08/12/14 1059  Gross per 24 hour  Intake    120 ml  Output    750 ml  Net   -630 ml    PHYSICAL EXAMINATION:  GENERAL:  79 y.o.-year-old patient lying in the bed with no acute distress.  EYES: Pupils equal, round, reactive to light and accommodation. No scleral icterus. Extraocular muscles intact.  HEENT: Head atraumatic, normocephalic. Oropharynx and nasopharynx clear.  NECK:  Supple, no jugular venous distention. No thyroid enlargement, no tenderness.  LUNGS: Normal breath sounds bilaterally, no wheezing, rales,rhonchi or crepitation. No use of accessory muscles of respiration.  CARDIOVASCULAR: S1, S2 normal. No murmurs, rubs, or gallops.  ABDOMEN: Soft, non-tender, non-distended. Bowel sounds present. No organomegaly or mass.  EXTREMITIES: No pedal edema, cyanosis, or clubbing.  NEUROLOGIC: Cranial nerves II through XII are intact. No focal motor or sensory defecits b/l.  PSYCHIATRIC: The patient is alert and oriented x 3.  SKIN: No obvious rash, lesion, or ulcer.   DATA REVIEW:   CBC  Recent Labs Lab 08/11/14 1949  WBC 11.4*  HGB 11.9*  HCT 38.5  PLT 253    Chemistries   Recent Labs Lab 08/11/14 1430 08/11/14 1949  NA 140  --   K 4.5  --   CL 103  --   CO2 28  --   GLUCOSE 165*  --   BUN 21*  --   CREATININE 0.97 0.87  CALCIUM 9.7  --     Cardiac Enzymes  Recent Labs Lab 08/11/14 2037  TROPONINI <0.03    Microbiology Results  No results found for this or any previous visit.  RADIOLOGY:  Dg Chest 2 View  08/11/2014   CLINICAL DATA:  Chest pain  EXAM: CHEST  2 VIEW  COMPARISON:  01/12/2012  FINDINGS: Previous median sternotomy and CABG procedure. The heart size appears normal. No pleural effusion or edema. There is no airspace  consolidation. Lungs are hyperinflated and there are coarsened interstitial markings noted bilaterally. Extensive biapical scarring is similar to previous exam.  IMPRESSION: 1. Hyperinflation and biapical scarring. 2. No acute findings.   Electronically Signed   By: Kerby Moors M.D.   On: 08/11/2014 19:18      Management plans discussed with the patient, family and they are in agreement.  CODE STATUS:     Code Status Orders        Start     Ordered   08/11/14 1859  Full code   Continuous     08/11/14 1900    Advance Directive Documentation        Most Recent Value   Type of Advance Directive  Living will   Pre-existing out of facility DNR order (yellow form or pink MOST form)     "MOST" Form in Place?        TOTAL TIME TAKING CARE  OF THIS PATIENT: 35 minutes.    Henreitta Leber M.D on 08/12/2014 at 3:57 PM  Between 7am to 6pm - Pager - [phone]  After 6pm go to [email or web address] - password [email or web address] [facility]  [place] [facility]  Office  [phone]  CC: Primary care physician; Otilio Miu, MD

## 2014-08-13 DIAGNOSIS — E782 Mixed hyperlipidemia: Secondary | ICD-10-CM | POA: Insufficient documentation

## 2014-08-27 DIAGNOSIS — I10 Essential (primary) hypertension: Secondary | ICD-10-CM | POA: Insufficient documentation

## 2014-08-27 DIAGNOSIS — I25118 Atherosclerotic heart disease of native coronary artery with other forms of angina pectoris: Secondary | ICD-10-CM | POA: Insufficient documentation

## 2014-08-27 DIAGNOSIS — I071 Rheumatic tricuspid insufficiency: Secondary | ICD-10-CM | POA: Insufficient documentation

## 2014-08-27 DIAGNOSIS — R001 Bradycardia, unspecified: Secondary | ICD-10-CM | POA: Insufficient documentation

## 2014-08-27 DIAGNOSIS — I340 Nonrheumatic mitral (valve) insufficiency: Secondary | ICD-10-CM | POA: Insufficient documentation

## 2014-09-09 ENCOUNTER — Other Ambulatory Visit: Payer: Self-pay | Admitting: Family Medicine

## 2014-09-09 DIAGNOSIS — E785 Hyperlipidemia, unspecified: Secondary | ICD-10-CM

## 2014-10-13 ENCOUNTER — Ambulatory Visit
Admission: RE | Admit: 2014-10-13 | Discharge: 2014-10-13 | Disposition: A | Discharge status: Home / Self Care | Payer: Medicare Other | Source: Ambulatory Visit | Attending: Family Medicine | Admitting: Family Medicine

## 2014-10-13 ENCOUNTER — Other Ambulatory Visit: Payer: Self-pay | Admitting: Family Medicine

## 2014-10-13 ENCOUNTER — Ambulatory Visit (INDEPENDENT_AMBULATORY_CARE_PROVIDER_SITE_OTHER): Payer: Medicare Other | Admitting: Family Medicine

## 2014-10-13 ENCOUNTER — Encounter: Payer: Self-pay | Admitting: Family Medicine

## 2014-10-13 VITALS — BP 120/70 | HR 64 | Ht 66.0 in | Wt 140.0 lb

## 2014-10-13 DIAGNOSIS — M25551 Pain in right hip: Secondary | ICD-10-CM

## 2014-10-13 DIAGNOSIS — E7849 Other hyperlipidemia: Secondary | ICD-10-CM | POA: Insufficient documentation

## 2014-10-13 DIAGNOSIS — M1611 Unilateral primary osteoarthritis, right hip: Secondary | ICD-10-CM | POA: Insufficient documentation

## 2014-10-13 DIAGNOSIS — Z0000 Encounter for general adult medical examination without abnormal findings: Secondary | ICD-10-CM | POA: Insufficient documentation

## 2014-10-13 DIAGNOSIS — Z79899 Other long term (current) drug therapy: Secondary | ICD-10-CM | POA: Insufficient documentation

## 2014-10-13 DIAGNOSIS — M4850XA Collapsed vertebra, not elsewhere classified, site unspecified, initial encounter for fracture: Secondary | ICD-10-CM | POA: Insufficient documentation

## 2014-10-13 DIAGNOSIS — F419 Anxiety disorder, unspecified: Secondary | ICD-10-CM | POA: Insufficient documentation

## 2014-10-13 NOTE — Progress Notes (Signed)
Name: Phyllis Ross   MRN: [id number]    DOB: 1935/05/09   Date:10/13/2014       Progress Note  Subjective  Chief Complaint  Chief Complaint  Patient presents with  . Hip Pain    had R) hip pain while playing pickle ball that radiated down R) leg    Hip Pain  The incident occurred 3 to 5 days ago. The incident occurred at the gym. The injury mechanism was a twisting injury. The pain is present in the right hip. The quality of the pain is described as aching. The pain is at a severity of 6/10. The pain is moderate. The pain has been improving since onset. Pertinent negatives include no inability to bear weight, loss of motion, loss of sensation, muscle weakness, numbness or tingling. The symptoms are aggravated by movement. She has tried heat for the symptoms. The treatment provided moderate relief.    No problem-specific assessment & plan notes found for this encounter.   Past Medical History  Diagnosis Date  . Cancer   . MI (myocardial infarction)   . Rheumatic fever     Past Surgical History  Procedure Laterality Date  . Coronary artery bypass graft    . Colonoscopy  2011    normal- Dr Vira Agar    No family history on file.  History   Social History  . Marital Status: Married    Spouse Name: N/A  . Number of Children: N/A  . Years of Education: N/A   Occupational History  . Not on file.   Social History Main Topics  . Smoking status: Never Smoker   . Smokeless tobacco: Not on file  . Alcohol Use: No  . Drug Use: Not on file  . Sexual Activity: Not on file   Other Topics Concern  . Not on file   Social History Narrative    No Known Allergies   Review of Systems  Neurological: Negative for tingling and numbness.     Objective  Filed Vitals:   10/13/14 1403  BP: 120/70  Pulse: 64  Height: 5\' 6"  (1.676 m)  Weight: 140 lb (63.504 kg)    Physical Exam    Assessment & Plan  Problem List Items Addressed This Visit    None    Visit  Diagnoses    Hip pain, acute, right    -  Primary    Relevant Orders    DG HIP UNILAT WITH PELVIS 1V RIGHT         Dr. Deanna Jones [facility]  10/13/2014

## 2014-11-07 ENCOUNTER — Other Ambulatory Visit: Payer: Self-pay | Admitting: Family Medicine

## 2014-11-07 DIAGNOSIS — E785 Hyperlipidemia, unspecified: Secondary | ICD-10-CM

## 2014-12-12 ENCOUNTER — Other Ambulatory Visit: Payer: Self-pay

## 2014-12-12 ENCOUNTER — Other Ambulatory Visit: Payer: Self-pay | Admitting: Family Medicine

## 2014-12-12 DIAGNOSIS — I10 Essential (primary) hypertension: Secondary | ICD-10-CM

## 2015-01-09 ENCOUNTER — Other Ambulatory Visit: Payer: Self-pay

## 2015-01-12 ENCOUNTER — Encounter: Payer: Self-pay | Admitting: Family Medicine

## 2015-01-12 ENCOUNTER — Ambulatory Visit (INDEPENDENT_AMBULATORY_CARE_PROVIDER_SITE_OTHER): Payer: Medicare Other | Admitting: Family Medicine

## 2015-01-12 VITALS — BP 124/80 | HR 80 | Ht 66.0 in | Wt 141.0 lb

## 2015-01-12 DIAGNOSIS — E784 Other hyperlipidemia: Secondary | ICD-10-CM | POA: Diagnosis not present

## 2015-01-12 DIAGNOSIS — F419 Anxiety disorder, unspecified: Secondary | ICD-10-CM

## 2015-01-12 DIAGNOSIS — E785 Hyperlipidemia, unspecified: Secondary | ICD-10-CM

## 2015-01-12 DIAGNOSIS — I2510 Atherosclerotic heart disease of native coronary artery without angina pectoris: Secondary | ICD-10-CM | POA: Diagnosis not present

## 2015-01-12 DIAGNOSIS — I10 Essential (primary) hypertension: Secondary | ICD-10-CM

## 2015-01-12 DIAGNOSIS — M8080XD Other osteoporosis with current pathological fracture, unspecified site, subsequent encounter for fracture with routine healing: Secondary | ICD-10-CM | POA: Diagnosis not present

## 2015-01-12 DIAGNOSIS — E7849 Other hyperlipidemia: Secondary | ICD-10-CM

## 2015-01-12 MED ORDER — ATORVASTATIN CALCIUM 80 MG PO TABS: ORAL_TABLET | ORAL | Status: DC

## 2015-01-12 MED ORDER — IBANDRONATE SODIUM 150 MG PO TABS
150.0000 mg | ORAL_TABLET | ORAL | Status: DC
Start: 2015-01-12 — End: 2015-09-24

## 2015-01-12 MED ORDER — ALPRAZOLAM 0.25 MG PO TABS: 0.2500 mg | ORAL_TABLET | ORAL | Status: DC | PRN

## 2015-01-12 MED ORDER — LISINOPRIL 10 MG PO TABS: ORAL_TABLET | ORAL | Status: DC

## 2015-01-12 MED ORDER — ASPIRIN EC 81 MG PO TBEC: 81.0000 mg | DELAYED_RELEASE_TABLET | Freq: Every day | ORAL | Status: DC

## 2015-01-12 NOTE — Progress Notes (Signed)
Name: Phyllis Ross   MRN: [id number]    DOB: June 15, 1935   Date:01/12/2015       Progress Note  Subjective  Chief Complaint  Chief Complaint  Patient presents with  . Hypertension  . Hyperlipidemia  . Osteoporosis    HPI Comments: Recheck for osteoporosis and currently taking Boniva.  Hypertension This is a chronic problem. The current episode started more than 1 year ago. The problem has been gradually improving since onset. The problem is controlled. Pertinent negatives include no anxiety, blurred vision, chest pain, headaches, malaise/fatigue, neck pain, orthopnea, palpitations, peripheral edema, PND, shortness of breath or sweats. There are no associated agents to hypertension. Risk factors for coronary artery disease include dyslipidemia and post-menopausal state. Past treatments include ACE inhibitors. The current treatment provides moderate improvement. There are no compliance problems.  There is no history of angina, kidney disease, CAD/MI, CVA, heart failure, left ventricular hypertrophy, PVD or renovascular disease. There is no history of chronic renal disease.  Hyperlipidemia This is a chronic problem. The current episode started more than 1 year ago. The problem is controlled. Recent lipid tests were reviewed and are normal. She has no history of chronic renal disease, diabetes, hypothyroidism, liver disease, obesity or nephrotic syndrome. Pertinent negatives include no chest pain, focal sensory loss, focal weakness, leg pain, myalgias or shortness of breath. Current antihyperlipidemic treatment includes statins. The current treatment provides moderate improvement of lipids. There are no compliance problems.  Risk factors for coronary artery disease include hypertension, dyslipidemia and post-menopausal.  Anxiety Presents for follow-up visit. Patient reports no chest pain, compulsions, confusion, decreased concentration, depressed mood, dizziness, dry mouth, excessive worry,  feeling of choking, hyperventilation, impotence, insomnia, irritability, malaise, muscle tension, nausea, nervous/anxious behavior, obsessions, palpitations, panic, restlessness, shortness of breath or suicidal ideas. The severity of symptoms is mild. Nothing aggravates the symptoms.      No problem-specific assessment & plan notes found for this encounter.   Past Medical History  Diagnosis Date  . Cancer ([place])   . MI (myocardial infarction) ([place])   . Rheumatic fever   . Hyperlipidemia   . Hypertension   . Anxiety   . Osteoporosis     Past Surgical History  Procedure Laterality Date  . Coronary artery bypass graft    . Colonoscopy  2011    normal- Dr Vira Agar    History reviewed. No pertinent family history.  Social History   Social History  . Marital Status: Married    Spouse Name: N/A  . Number of Children: N/A  . Years of Education: N/A   Occupational History  . Not on file.   Social History Main Topics  . Smoking status: Never Smoker   . Smokeless tobacco: Not on file  . Alcohol Use: No  . Drug Use: Not on file  . Sexual Activity: Yes   Other Topics Concern  . Not on file   Social History Narrative    No Known Allergies   Review of Systems  Constitutional: Negative for fever, chills, weight loss, malaise/fatigue and irritability.  HENT: Negative for ear discharge, ear pain and sore throat.   Eyes: Negative for blurred vision.  Respiratory: Negative for cough, sputum production, shortness of breath and wheezing.   Cardiovascular: Negative for chest pain, palpitations, orthopnea, leg swelling and PND.  Gastrointestinal: Negative for heartburn, nausea, abdominal pain, diarrhea, constipation, blood in stool and melena.  Genitourinary: Negative for dysuria, urgency, frequency, hematuria and impotence.  Musculoskeletal: Negative for myalgias, back pain,  joint pain and neck pain.  Skin: Negative for rash.  Neurological: Negative for dizziness, tingling,  sensory change, focal weakness and headaches.  Endo/Heme/Allergies: Negative for environmental allergies and polydipsia. Does not bruise/bleed easily.  Psychiatric/Behavioral: Negative for depression, suicidal ideas, confusion and decreased concentration. The patient is not nervous/anxious and does not have insomnia.      Objective  Filed Vitals:   01/12/15 1342  BP: 124/80  Pulse: 80  Height: 5\' 6"  (1.676 m)  Weight: 141 lb (63.957 kg)    Physical Exam  Constitutional: She is well-developed, well-nourished, and in no distress. No distress.  HENT:  Head: Normocephalic and atraumatic.  Right Ear: External ear normal.  Left Ear: External ear normal.  Nose: Nose normal.  Mouth/Throat: Oropharynx is clear and moist.  Eyes: Conjunctivae and EOM are normal. Pupils are equal, round, and reactive to light. Right eye exhibits no discharge. Left eye exhibits no discharge.  Neck: Normal range of motion. Neck supple. No JVD present. No thyromegaly present.  Cardiovascular: Normal rate, regular rhythm, normal heart sounds and intact distal pulses.  Exam reveals no gallop and no friction rub.   No murmur heard. Pulmonary/Chest: Effort normal and breath sounds normal.  Abdominal: Soft. Bowel sounds are normal. She exhibits no mass. There is no tenderness. There is no guarding.  Musculoskeletal: Normal range of motion. She exhibits no edema.  Lymphadenopathy:    She has no cervical adenopathy.  Neurological: She is alert. She has normal reflexes.  Skin: Skin is warm and dry. She is not diaphoretic.  Psychiatric: Mood and affect normal.      Assessment & Plan  Problem List Items Addressed This Visit      Cardiovascular and Mediastinum   Benign essential HTN - Primary   Relevant Medications   aspirin EC 81 MG tablet   atorvastatin (LIPITOR) 80 MG tablet   lisinopril (PRINIVIL,ZESTRIL) 10 MG tablet   Other Relevant Orders   Renal Function Panel   Arteriosclerosis of coronary artery    Relevant Medications   aspirin EC 81 MG tablet   atorvastatin (LIPITOR) 80 MG tablet   lisinopril (PRINIVIL,ZESTRIL) 10 MG tablet     Other   Familial multiple lipoprotein-type hyperlipidemia   Relevant Medications   aspirin EC 81 MG tablet   atorvastatin (LIPITOR) 80 MG tablet   lisinopril (PRINIVIL,ZESTRIL) 10 MG tablet    Other Visit Diagnoses    Osteoporosis with fracture, with routine healing, subsequent encounter        Relevant Medications    ibandronate (BONIVA) 150 MG tablet    Acute anxiety        Relevant Medications    ALPRAZolam (XANAX) 0.25 MG tablet    Hyperlipidemia        Relevant Medications    aspirin EC 81 MG tablet    atorvastatin (LIPITOR) 80 MG tablet    lisinopril (PRINIVIL,ZESTRIL) 10 MG tablet    Other Relevant Orders    Lipid Profile    Essential hypertension        Relevant Medications    aspirin EC 81 MG tablet    atorvastatin (LIPITOR) 80 MG tablet    lisinopril (PRINIVIL,ZESTRIL) 10 MG tablet         Dr. [facility]  01/12/2015

## 2015-01-13 LAB — RENAL FUNCTION PANEL
Albumin: 4.2 g/dL (ref 3.5–4.8)
BUN/Creatinine Ratio: 21 (ref 11–26)
BUN: 19 mg/dL (ref 8–27)
CHLORIDE: 100 mmol/L (ref 97–108)
CO2: 27 mmol/L (ref 18–29)
Calcium: 9.8 mg/dL (ref 8.7–10.3)
Creatinine, Ser: 0.89 mg/dL (ref 0.57–1.00)
GFR calc non Af Amer: 63 mL/min/{1.73_m2} (ref >59)
GFR, EST AFRICAN AMERICAN: 73 mL/min/{1.73_m2} (ref >59)
Glucose: 89 mg/dL (ref 65–99)
Phosphorus: 3.7 mg/dL (ref 2.5–4.5)
Potassium: 4.8 mmol/L (ref 3.5–5.2)
Sodium: 142 mmol/L (ref 134–144)

## 2015-01-13 LAB — LIPID PANEL
CHOLESTEROL TOTAL: 165 mg/dL (ref 100–199)
Chol/HDL Ratio: 3.8 ratio units (ref 0.0–4.4)
HDL: 43 mg/dL (ref >39)
LDL Calculated: 77 mg/dL (ref 0–99)
Triglycerides: 223 mg/dL — ABNORMAL HIGH (ref 0–149)
VLDL CHOLESTEROL CAL: 45 mg/dL — AB (ref 5–40)

## 2015-05-01 ENCOUNTER — Encounter: Payer: Self-pay | Admitting: Family Medicine

## 2015-05-01 ENCOUNTER — Ambulatory Visit (INDEPENDENT_AMBULATORY_CARE_PROVIDER_SITE_OTHER): Payer: Medicare Other | Admitting: Family Medicine

## 2015-05-01 VITALS — BP 112/62 | HR 70 | Ht 66.0 in | Wt 139.0 lb

## 2015-05-01 DIAGNOSIS — Z0000 Encounter for general adult medical examination without abnormal findings: Secondary | ICD-10-CM | POA: Diagnosis not present

## 2015-05-01 NOTE — Progress Notes (Signed)
Patient: Phyllis Ross, Female    DOB: 1935-04-17, 80 y.o.   MRN: [id number] Visit Date: 05/01/2015  Today's Provider: Otilio Miu, MD   Chief Complaint  Patient presents with  . Medicare Wellness   Subjective:   Initial preventative physical exam Phyllis Ross is a 80 y.o. female who presents today for her Initial Preventative Physical Exam. She feels well. She reports exercising to extreme(pickleball). She reports she is sleeping well.  HPI Comments: Patient presents for annual medicare wellness.   Review of Systems  Constitutional: Negative for fever, chills, fatigue and unexpected weight change.  HENT: Negative for ear pain, hearing loss, nosebleeds, sneezing, sore throat and trouble swallowing.   Eyes: Negative for photophobia, pain, redness, itching and visual disturbance.  Respiratory: Negative for cough, chest tightness, shortness of breath and wheezing.   Cardiovascular: Negative for chest pain, palpitations and leg swelling.  Gastrointestinal: Negative for nausea, vomiting, abdominal pain, diarrhea, constipation, blood in stool and rectal pain.  Endocrine: Negative for cold intolerance, heat intolerance, polydipsia, polyphagia and polyuria.  Genitourinary: Negative for dysuria, hematuria, flank pain, vaginal bleeding, vaginal discharge, difficulty urinating, menstrual problem and pelvic pain.  Musculoskeletal: Negative for back pain, joint swelling, neck pain and neck stiffness.  Skin: Negative for color change and rash.  Allergic/Immunologic: Negative for immunocompromised state.  Neurological: Negative for dizziness, tremors, seizures, syncope, facial asymmetry, speech difficulty, weakness, light-headedness, numbness and headaches.  Hematological: Does not bruise/bleed easily.  Psychiatric/Behavioral: Negative for suicidal ideas, hallucinations, behavioral problems, confusion, sleep disturbance, self-injury, decreased concentration and agitation. The patient is not  nervous/anxious.     Social History   Social History  . Marital Status: Married    Spouse Name: N/A  . Number of Children: N/A  . Years of Education: N/A   Occupational History  . Not on file.   Social History Main Topics  . Smoking status: Never Smoker   . Smokeless tobacco: Not on file  . Alcohol Use: No  . Drug Use: Not on file  . Sexual Activity: Yes   Other Topics Concern  . Not on file   Social History Narrative    Patient Active Problem List   Diagnosis Date Noted  . 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  . Wedging of vertebra ([place]) 10/13/2014  . Polypharmacy 10/13/2014  . Encounter for general adult medical examination without abnormal findings 10/13/2014  . Anxiety 10/13/2014  . Benign essential HTN 08/27/2014  . Bradycardia 08/27/2014  . Arteriosclerosis of coronary artery 08/27/2014  . MI (mitral incompetence) 08/27/2014  . TI (tricuspid incompetence) 08/27/2014  . Combined fat and carbohydrate induced hyperlipemia 08/13/2014  . HTN (hypertension), malignant 08/11/2014  . Chest pain 08/11/2014  . CAD (coronary artery disease) of artery bypass graft 08/11/2014    Past Surgical History  Procedure Laterality Date  . Coronary artery bypass graft    . Colonoscopy  2011    normal- Dr Vira Agar    Her family history is not on file.    Previous Medications   ALPRAZOLAM (XANAX) 0.25 MG TABLET    Take 1 tablet (0.25 mg total) by mouth as needed.   ASPIRIN EC 81 MG TABLET    Take 1 tablet (81 mg total) by mouth daily.   ATORVASTATIN (LIPITOR) 80 MG TABLET    TAKE ONE TABLET AT BEDTIME.   IBANDRONATE (BONIVA) 150 MG TABLET    Take 1 tablet (150 mg total) by mouth every 30 (thirty) days.   LISINOPRIL (PRINIVIL,ZESTRIL) 10 MG TABLET  TAKE TWO TABLETS DAILY AS DIRECTED BY PHYSICIAN   NITROGLYCERIN (NITROSTAT) 0.4 MG SL TABLET    Place 1 tablet under the tongue as needed.    Patient Care Team: Juline Patch, MD as PCP - General  (Family Medicine)     Objective:   Vitals: BP 112/62 mmHg  Pulse 70  Ht 5\' 6"  (1.676 m)  Wt 139 lb (63.05 kg)  BMI 22.45 kg/m2  Physical Exam  Constitutional: She is oriented to person, place, and time. She appears well-developed and well-nourished.  HENT:  Head: Normocephalic.  Right Ear: External ear normal.  Left Ear: External ear normal.  Nose: Nose normal.  Eyes: Conjunctivae and EOM are normal. Pupils are equal, round, and reactive to light.  Neck: Normal range of motion. Neck supple.  Cardiovascular: Normal rate, regular rhythm, normal heart sounds and intact distal pulses.   Pulmonary/Chest: Effort normal and breath sounds normal.  Abdominal: Soft. Bowel sounds are normal.  Genitourinary: Vagina normal and uterus normal.  Musculoskeletal: Normal range of motion.  Neurological: She is alert and oriented to person, place, and time. She has normal reflexes.  Skin: Skin is warm and dry.  Psychiatric: She has a normal mood and affect. Her behavior is normal. Judgment and thought content normal.     No exam data present  Activities of Daily Living In your present state of health, do you have any difficulty performing the following activities: 05/01/2015 10/13/2014  Hearing? N N  Vision? N N  Difficulty concentrating or making decisions? N N  Walking or climbing stairs? N N  Dressing or bathing? N N  Doing errands, shopping? N N    Fall Risk Assessment Fall Risk  05/01/2015 10/13/2014  Falls in the past year? No No     Patient reports there are safety devices in place in shower at home. Shower rail  Depression Screen PHQ 2/9 Scores 05/01/2015 10/13/2014  PHQ - 2 Score 0 0    Cognitive Testing - 6-CIT   Correct? Score   What year is it? yes 0 Yes = 0    No = 4  What month is it? yes 0 Yes = 0    No = 3  Remember:     Pia [address]au, [place], [place]     What time is it? yes 0 Yes = 0    No = 3  Count backwards from 20 to 1 yes 0 Correct = 0    1 error = 2    More than 1 error = 4  Say the months of the year in reverse. yes 0 Correct = 0    1 error = 2   More than 1 error = 4  What address did I ask you to remember? yes 0 Correct = 0  1 error = 2    2 error = 4    3 error = 6    4 error = 8    All wrong = 10       TOTAL SCORE  0/28   Interpretation:  Normal  Normal (0-7) Abnormal (8-28)     Assessment & Plan:     Initial Preventative Physical Exam  Reviewed patient's Family Medical History Reviewed and updated list of patient's medical providers Assessment of cognitive impairment was done Assessed patient's functional ability Established a written schedule for health screening [place] Completed and Reviewed  Exercise Activities and Dietary recommendations Goals    None  There is no immunization history on file for this patient.  Health Maintenance  Topic Date Due  . TETANUS/TDAP  02/11/1957  . ZOSTAVAX  02/11/1998  . DEXA SCAN  02/12/2003  . PNA vac Low Risk Adult (1 of 2 - PCV13) 02/12/2003  . INFLUENZA VACCINE  11/03/2014      Discussed health benefits of physical activity, and encouraged her to engage in regular exercise appropriate for her age and condition.    ------------------------------------------------------------------------------------------------------------   Problem List Items Addressed This Visit    None    Visit Diagnoses    Medicare annual wellness visit, subsequent    -  Primary      Pt was asked about getting a mammogram- refused stating, "I might not ever get another one, they hurt me so bad."    Otilio Miu, MD [facility]  05/01/2015

## 2015-05-01 NOTE — Patient Instructions (Signed)

## 2015-05-13 ENCOUNTER — Ambulatory Visit: Payer: Self-pay

## 2015-05-20 ENCOUNTER — Inpatient Hospital Stay: Payer: Medicare Other

## 2015-05-20 ENCOUNTER — Encounter: Payer: Self-pay | Admitting: Obstetrics and Gynecology

## 2015-05-20 ENCOUNTER — Inpatient Hospital Stay: Payer: Medicare Other | Attending: Obstetrics and Gynecology | Admitting: Obstetrics and Gynecology

## 2015-05-20 VITALS — BP 176/78 | HR 69 | Temp 97.7°F | Ht 66.0 in | Wt 138.7 lb

## 2015-05-20 DIAGNOSIS — I10 Essential (primary) hypertension: Secondary | ICD-10-CM | POA: Insufficient documentation

## 2015-05-20 DIAGNOSIS — Z79899 Other long term (current) drug therapy: Secondary | ICD-10-CM | POA: Diagnosis not present

## 2015-05-20 DIAGNOSIS — Z8543 Personal history of malignant neoplasm of ovary: Secondary | ICD-10-CM | POA: Diagnosis present

## 2015-05-20 DIAGNOSIS — R001 Bradycardia, unspecified: Secondary | ICD-10-CM | POA: Insufficient documentation

## 2015-05-20 DIAGNOSIS — Z9221 Personal history of antineoplastic chemotherapy: Secondary | ICD-10-CM | POA: Insufficient documentation

## 2015-05-20 DIAGNOSIS — E782 Mixed hyperlipidemia: Secondary | ICD-10-CM | POA: Diagnosis not present

## 2015-05-20 DIAGNOSIS — Z7982 Long term (current) use of aspirin: Secondary | ICD-10-CM | POA: Insufficient documentation

## 2015-05-20 DIAGNOSIS — I340 Nonrheumatic mitral (valve) insufficiency: Secondary | ICD-10-CM | POA: Diagnosis not present

## 2015-05-20 DIAGNOSIS — I2510 Atherosclerotic heart disease of native coronary artery without angina pectoris: Secondary | ICD-10-CM | POA: Diagnosis not present

## 2015-05-20 NOTE — Progress Notes (Signed)
Gynecologic Oncology Visit    Chief Concern: Ovarian cancer surveillance  Subjective:  Phyllis Ross is a 80 y.o. female who has a history of stage IIIb ovarian adenocarcinoma s/p surgical cytoreduction in 10/2003 followed by paclitaxel and carboplatin chemotherapy completed 04/2004.     CA125 not drawn last year.   March 2014 28.06 Feb 2014 BI-RADS CATEGORY  1: Negative. Per her report she also had a mammogram last year that was negative.   Problem List: Patient Active Problem List   Diagnosis Date Noted  . History of ovarian cancer 05/20/2015  . 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  . Wedging of vertebra ([place]) 10/13/2014  . Polypharmacy 10/13/2014  . Encounter for general adult medical examination without abnormal findings 10/13/2014  . Anxiety 10/13/2014  . Benign essential HTN 08/27/2014  . Bradycardia 08/27/2014  . Arteriosclerosis of coronary artery 08/27/2014  . MI (mitral incompetence) 08/27/2014  . TI (tricuspid incompetence) 08/27/2014  . Combined fat and carbohydrate induced hyperlipemia 08/13/2014  . HTN (hypertension), malignant 08/11/2014  . Chest pain 08/11/2014  . CAD (coronary artery disease) of artery bypass graft 08/11/2014    Past Medical History: Past Medical History  Diagnosis Date  . Cancer ([place])   . MI (myocardial infarction) ([place])   . Rheumatic fever   . Hyperlipidemia   . Hypertension   . Anxiety   . Osteoporosis     Past Surgical History: Past Surgical History  Procedure Laterality Date  . Coronary artery bypass graft    . Colonoscopy  2011    normal- Dr Vira Agar  . Vaginal hysterectomy      DUB    Past Gynecologic History:  As per Oncology history  OB History:  OB History  No data available    Family History: No family history on file.  Social History: Social History   Social History  . Marital Status: Married    Spouse Name: N/A  . Number of Children: N/A  . Years of Education: N/A    Occupational History  . Not on file.   Social History Main Topics  . Smoking status: Never Smoker   . Smokeless tobacco: Not on file  . Alcohol Use: No  . Drug Use: Not on file  . Sexual Activity: Yes   Other Topics Concern  . Not on file   Social History Narrative    Allergies: No Known Allergies  Current Medications: Current Outpatient Prescriptions  Medication Sig Dispense Refill  . ALPRAZolam (XANAX) 0.25 MG tablet Take 1 tablet (0.25 mg total) by mouth as needed. 30 tablet 1  . aspirin EC 81 MG tablet Take 1 tablet (81 mg total) by mouth daily. 30 tablet 11  . atorvastatin (LIPITOR) 80 MG tablet TAKE ONE TABLET AT BEDTIME. 30 tablet 5  . ibandronate (BONIVA) 150 MG tablet Take 1 tablet (150 mg total) by mouth every 30 (thirty) days. 1 tablet 11  . lisinopril (PRINIVIL,ZESTRIL) 10 MG tablet TAKE TWO TABLETS DAILY AS DIRECTED BY PHYSICIAN 60 tablet 5  . nitroGLYCERIN (NITROSTAT) 0.4 MG SL tablet Place 1 tablet under the tongue as needed.     No current facility-administered medications for this visit.    Review of Systems General: no complaints  HEENT: no complaints  Lungs: no complaints  Cardiac: no complaints  GI: no complaints  GU: no complaints  Musculoskeletal: no complaints  Extremities: no complaints  Skin: no complaints  Neuro: no complaints  Endocrine: no complaints  Psych: no complaints  Objective:  Physical Examination:  BP 176/78 mmHg  Pulse 69  Temp(Src) 97.7 F (36.5 C) (Oral)  Ht 5\' 6"  (1.676 m)  Wt 138 lb 10.7 oz (62.9 kg)  BMI 22.39 kg/m2   ECOG Performance Status: 0 - Asymptomatic  General appearance: alert, cooperative and appears stated age HEENT:PERRLA, extra ocular movement intact and sclera clear, anicteric Lymph node survey: non-palpable, axillary, inguinal, supraclavicular Cardiovascular: regular rate and rhythm Respiratory: normal air entry, lungs clear to auscultation Abdomen: soft, non-tender, without masses or  organomegaly, no hernias and well healed incision Back: inspection of back is normal Extremities: extremities normal, atraumatic, no cyanosis or edema Skin exam - numerous actinic skin lesions present Neurological exam reveals alert, oriented, normal speech, no focal findings or movement disorder noted.  Pelvic: exam chaperoned by nurse;  Vulva: normal appearing vulva with no masses, tenderness or lesions; Vagina: normal vagina; Adnexa: no masses, surgically absent bilateral; Uterus: surgically absent, vaginal cuff well healed; Cervix: absent; Rectal: normal rectal, no masses    Lab Review Labs on site today: CA125 pending  Radiologic Imaging: None    Assessment:  Phyllis Ross is a 80 y.o. female diagnosed with history of stage IIIB   ovarian cancer, clinically NED.  Plan:   Problem List Items Addressed This Visit      Genitourinary   History of ovarian cancer - Primary     I recommended CA125 today. I discussed that I typically get this test for surveillance and she was okay with ordering the lab. If negative I suggested return to clinic in  1 year.    I recommended that she discuss screening mammograms and colonoscopy with her PCP to review current guidelines.   Gillis Ends, MD    CC:  Juline Patch, MD [address] [place], [place] [address] [phone]

## 2015-05-20 NOTE — Progress Notes (Signed)
  Oncology Nurse Navigator Documentation  Navigator Location: [facility] (05/20/15 1400) Navigator Encounter Type: Follow-up Appt (gyn onc) (05/20/15 1400)           Patient Visit Type: Follow-up (gyn onc) (05/20/15 1400)   Barriers/Navigation Needs: No barriers at this time;No Questions;No Needs (05/20/15 1400)                Acuity: Level 1 (05/20/15 1400)         Time Spent with Patient: 15 (05/20/15 1400)   Assisted with pelvic exam

## 2015-05-21 LAB — CA 125: CA 125: 22.8 U/mL (ref 0.0–38.1)

## 2015-05-27 ENCOUNTER — Ambulatory Visit: Payer: Medicare Other

## 2015-05-29 ENCOUNTER — Encounter: Payer: Self-pay | Admitting: Obstetrics and Gynecology

## 2015-08-14 ENCOUNTER — Other Ambulatory Visit: Payer: Self-pay

## 2015-09-24 ENCOUNTER — Encounter: Payer: Self-pay | Admitting: Family Medicine

## 2015-09-24 ENCOUNTER — Ambulatory Visit (INDEPENDENT_AMBULATORY_CARE_PROVIDER_SITE_OTHER): Payer: Medicare Other | Admitting: Family Medicine

## 2015-09-24 VITALS — BP 130/80 | HR 78 | Ht 66.0 in | Wt 135.0 lb

## 2015-09-24 DIAGNOSIS — M8080XD Other osteoporosis with current pathological fracture, unspecified site, subsequent encounter for fracture with routine healing: Secondary | ICD-10-CM | POA: Diagnosis not present

## 2015-09-24 DIAGNOSIS — E784 Other hyperlipidemia: Secondary | ICD-10-CM | POA: Diagnosis not present

## 2015-09-24 DIAGNOSIS — I25810 Atherosclerosis of coronary artery bypass graft(s) without angina pectoris: Secondary | ICD-10-CM

## 2015-09-24 DIAGNOSIS — I10 Essential (primary) hypertension: Secondary | ICD-10-CM

## 2015-09-24 DIAGNOSIS — F419 Anxiety disorder, unspecified: Secondary | ICD-10-CM | POA: Diagnosis not present

## 2015-09-24 DIAGNOSIS — E785 Hyperlipidemia, unspecified: Secondary | ICD-10-CM | POA: Diagnosis not present

## 2015-09-24 DIAGNOSIS — E7849 Other hyperlipidemia: Secondary | ICD-10-CM

## 2015-09-24 MED ORDER — ALPRAZOLAM 0.25 MG PO TABS: 0.2500 mg | ORAL_TABLET | ORAL | Status: DC | PRN

## 2015-09-24 MED ORDER — LISINOPRIL 10 MG PO TABS
ORAL_TABLET | ORAL | Status: DC
Start: 2015-09-24 — End: 2016-06-16

## 2015-09-24 MED ORDER — ATORVASTATIN CALCIUM 80 MG PO TABS: ORAL_TABLET | ORAL | Status: DC

## 2015-09-24 MED ORDER — IBANDRONATE SODIUM 150 MG PO TABS: 150.0000 mg | ORAL_TABLET | ORAL | Status: DC

## 2015-09-24 NOTE — Progress Notes (Signed)
Name: Phyllis Ross   MRN: [id number]    DOB: 30-Oct-1935   Date:09/24/2015       Progress Note  Subjective  Chief Complaint  Chief Complaint  Patient presents with  . Hypertension  . Hyperlipidemia  . Osteoporosis  . Anxiety    Hypertension This is a chronic problem. The current episode started more than 1 year ago. The problem has been gradually improving since onset. The problem is controlled. Associated symptoms include anxiety. Pertinent negatives include no blurred vision, chest pain, headaches, malaise/fatigue, neck pain, orthopnea, palpitations, peripheral edema, PND, shortness of breath or sweats. There are no associated agents to hypertension. There are no known risk factors for coronary artery disease. Past treatments include ACE inhibitors. The current treatment provides mild improvement. There are no compliance problems.  There is no history of angina, kidney disease, CAD/MI, CVA, heart failure, left ventricular hypertrophy, PVD, renovascular disease or retinopathy. There is no history of chronic renal disease or a hypertension causing med.  Hyperlipidemia This is a chronic problem. The current episode started more than 1 year ago. The problem is controlled. Recent lipid tests were reviewed and are normal. She has no history of chronic renal disease, diabetes, hypothyroidism, liver disease, obesity or nephrotic syndrome. There are no known factors aggravating her hyperlipidemia. Pertinent negatives include no chest pain, focal sensory loss, focal weakness, leg pain, myalgias or shortness of breath. Current antihyperlipidemic treatment includes statins. The current treatment provides mild improvement of lipids. There are no compliance problems.  Risk factors for coronary artery disease include hypertension and dyslipidemia.  Anxiety Presents for follow-up visit. Symptoms include nervous/anxious behavior. Patient reports no chest pain, depressed mood, dizziness, dry mouth, excessive  worry, insomnia, irritability, nausea, palpitations, panic, shortness of breath or suicidal ideas. Symptoms occur occasionally. The severity of symptoms is mild.      No problem-specific assessment & plan notes found for this encounter.   Past Medical History  Diagnosis Date  . Cancer ([place])   . MI (myocardial infarction) ([place])   . Rheumatic fever   . Hyperlipidemia   . Hypertension   . Anxiety   . Osteoporosis     Past Surgical History  Procedure Laterality Date  . Coronary artery bypass graft    . Colonoscopy  2011    normal- Dr Vira Agar  . Vaginal hysterectomy      DUB    History reviewed. No pertinent family history.  Social History   Social History  . Marital Status: Married    Spouse Name: N/A  . Number of Children: N/A  . Years of Education: N/A   Occupational History  . Not on file.   Social History Main Topics  . Smoking status: Never Smoker   . Smokeless tobacco: Not on file  . Alcohol Use: No  . Drug Use: Not on file  . Sexual Activity: Yes   Other Topics Concern  . Not on file   Social History Narrative    No Known Allergies   Review of Systems  Constitutional: Negative for fever, chills, weight loss, malaise/fatigue and irritability.  HENT: Negative for ear discharge, ear pain and sore throat.   Eyes: Negative for blurred vision.  Respiratory: Negative for cough, sputum production, shortness of breath and wheezing.   Cardiovascular: Negative for chest pain, palpitations, orthopnea, leg swelling and PND.  Gastrointestinal: Negative for heartburn, nausea, abdominal pain, diarrhea, constipation, blood in stool and melena.  Genitourinary: Negative for dysuria, urgency, frequency and hematuria.  Musculoskeletal: Negative  for myalgias, back pain, joint pain and neck pain.  Skin: Negative for rash.  Neurological: Negative for dizziness, tingling, sensory change, focal weakness and headaches.  Endo/Heme/Allergies: Negative for environmental  allergies and polydipsia. Does not bruise/bleed easily.  Psychiatric/Behavioral: Negative for depression and suicidal ideas. The patient is nervous/anxious. The patient does not have insomnia.      Objective  Filed Vitals:   09/24/15 1005  BP: 130/80  Pulse: 78  Height: 5\' 6"  (1.676 m)  Weight: 135 lb (61.236 kg)    Physical Exam  Constitutional: She is well-developed, well-nourished, and in no distress. No distress.  HENT:  Head: Normocephalic and atraumatic.  Right Ear: External ear normal.  Left Ear: External ear normal.  Nose: Nose normal.  Mouth/Throat: Oropharynx is clear and moist.  Eyes: Conjunctivae and EOM are normal. Pupils are equal, round, and reactive to light. Right eye exhibits no discharge. Left eye exhibits no discharge.  Neck: Normal range of motion. Neck supple. No JVD present. No thyromegaly present.  Cardiovascular: Normal rate, regular rhythm, normal heart sounds and intact distal pulses.  Exam reveals no gallop and no friction rub.   No murmur heard. Pulmonary/Chest: Effort normal and breath sounds normal.  Abdominal: Soft. Bowel sounds are normal. She exhibits no mass. There is no tenderness. There is no guarding.  Musculoskeletal: Normal range of motion. She exhibits no edema.  Lymphadenopathy:    She has no cervical adenopathy.  Neurological: She is alert. She has normal reflexes.  Skin: Skin is warm and dry. She is not diaphoretic.  Psychiatric: Mood and affect normal.  Nursing note and vitals reviewed.     Assessment & Plan  Problem List Items Addressed This Visit      Cardiovascular and Mediastinum   CAD (coronary artery disease) of artery bypass graft   Relevant Medications   lisinopril (PRINIVIL,ZESTRIL) 10 MG tablet   atorvastatin (LIPITOR) 80 MG tablet   Benign essential HTN - Primary   Relevant Medications   lisinopril (PRINIVIL,ZESTRIL) 10 MG tablet   atorvastatin (LIPITOR) 80 MG tablet   Other Relevant Orders   Renal Function  Panel     Other   Familial multiple lipoprotein-type hyperlipidemia   Relevant Medications   lisinopril (PRINIVIL,ZESTRIL) 10 MG tablet   atorvastatin (LIPITOR) 80 MG tablet   Other Relevant Orders   Lipid Profile   Anxiety   Relevant Medications   ALPRAZolam (XANAX) 0.25 MG tablet    Other Visit Diagnoses    Acute anxiety        Relevant Medications    ALPRAZolam (XANAX) 0.25 MG tablet    Essential hypertension        Relevant Medications    lisinopril (PRINIVIL,ZESTRIL) 10 MG tablet    atorvastatin (LIPITOR) 80 MG tablet    Hyperlipidemia        Relevant Medications    lisinopril (PRINIVIL,ZESTRIL) 10 MG tablet    atorvastatin (LIPITOR) 80 MG tablet    Osteoporosis with fracture, with routine healing, subsequent encounter        Relevant Medications    ibandronate (BONIVA) 150 MG tablet         Dr. Deanna Jones [facility]  09/24/2015

## 2015-09-25 LAB — RENAL FUNCTION PANEL
Albumin: 4.3 g/dL (ref 3.5–4.8)
BUN/Creatinine Ratio: 19 (ref 12–28)
BUN: 18 mg/dL (ref 8–27)
CO2: 25 mmol/L (ref 18–29)
Calcium: 9.5 mg/dL (ref 8.7–10.3)
Chloride: 104 mmol/L (ref 96–106)
Creatinine, Ser: 0.93 mg/dL (ref 0.57–1.00)
GFR calc Af Amer: 69 mL/min/{1.73_m2} (ref >59)
GFR calc non Af Amer: 59 mL/min/{1.73_m2} — ABNORMAL LOW (ref >59)
Glucose: 91 mg/dL (ref 65–99)
Phosphorus: 3.7 mg/dL (ref 2.5–4.5)
Potassium: 4.8 mmol/L (ref 3.5–5.2)
Sodium: 144 mmol/L (ref 134–144)

## 2015-09-25 LAB — LIPID PANEL
CHOL/HDL RATIO: 3.4 ratio (ref 0.0–4.4)
Cholesterol, Total: 162 mg/dL (ref 100–199)
HDL: 48 mg/dL (ref >39)
LDL CALC: 91 mg/dL (ref 0–99)
TRIGLYCERIDES: 116 mg/dL (ref 0–149)
VLDL Cholesterol Cal: 23 mg/dL (ref 5–40)

## 2016-03-14 ENCOUNTER — Ambulatory Visit (INDEPENDENT_AMBULATORY_CARE_PROVIDER_SITE_OTHER): Payer: Medicare Other | Admitting: Family Medicine

## 2016-03-14 ENCOUNTER — Encounter: Payer: Self-pay | Admitting: Family Medicine

## 2016-03-14 VITALS — BP 130/62 | HR 64 | Ht 66.0 in | Wt 135.0 lb

## 2016-03-14 DIAGNOSIS — J40 Bronchitis, not specified as acute or chronic: Secondary | ICD-10-CM

## 2016-03-14 DIAGNOSIS — J029 Acute pharyngitis, unspecified: Secondary | ICD-10-CM | POA: Diagnosis not present

## 2016-03-14 MED ORDER — GUAIFENESIN-CODEINE 100-10 MG/5ML PO SYRP: 5.0000 mL | ORAL_SOLUTION | Freq: Three times a day (TID) | ORAL | 0 refills | Status: DC | PRN

## 2016-03-14 MED ORDER — AMOXICILLIN 500 MG PO CAPS: 500.0000 mg | ORAL_CAPSULE | Freq: Three times a day (TID) | ORAL | 0 refills | Status: DC

## 2016-03-14 NOTE — Progress Notes (Signed)
Name: Phyllis Ross   MRN: [id number]    DOB: 04-16-1935   Date:03/14/2016       Progress Note  Subjective  Chief Complaint  Chief Complaint  Patient presents with  . Sinusitis    cough, cong and sore throat with drainage    Sinusitis  This is a new problem. The current episode started in the past 7 days. The problem has been waxing and waning since onset. There has been no fever. She is experiencing no pain. Associated symptoms include chills, coughing, diaphoresis and a sore throat. Pertinent negatives include no congestion, ear pain, headaches, hoarse voice, neck pain, shortness of breath, sinus pressure, sneezing or swollen glands. Past treatments include nothing.    No problem-specific Assessment & Plan notes found for this encounter.   Past Medical History:  Diagnosis Date  . Anxiety   . Cancer ([place])   . Hyperlipidemia   . Hypertension   . MI (myocardial infarction)   . Osteoporosis   . Rheumatic fever     Past Surgical History:  Procedure Laterality Date  . COLONOSCOPY  2011   normal- Dr Vira Agar  . CORONARY ARTERY BYPASS GRAFT    . VAGINAL HYSTERECTOMY     DUB    History reviewed. No pertinent family history.  Social History   Social History  . Marital status: Married    Spouse name: N/A  . Number of children: N/A  . Years of education: N/A   Occupational History  . Not on file.   Social History Main Topics  . Smoking status: Never Smoker  . Smokeless tobacco: Not on file  . Alcohol use No  . Drug use: Unknown  . Sexual activity: Yes   Other Topics Concern  . Not on file   Social History Narrative  . No narrative on file    No Known Allergies   Review of Systems  Constitutional: Positive for chills and diaphoresis. Negative for fever, malaise/fatigue and weight loss.  HENT: Positive for sore throat. Negative for congestion, ear discharge, ear pain, hoarse voice, sinus pressure and sneezing.   Eyes: Negative for blurred vision.   Respiratory: Positive for cough. Negative for sputum production, shortness of breath and wheezing.   Cardiovascular: Negative for chest pain, palpitations and leg swelling.  Gastrointestinal: Negative for abdominal pain, blood in stool, constipation, diarrhea, heartburn, melena and nausea.  Genitourinary: Negative for dysuria, frequency, hematuria and urgency.  Musculoskeletal: Negative for back pain, joint pain, myalgias and neck pain.  Skin: Negative for rash.  Neurological: Negative for dizziness, tingling, sensory change, focal weakness and headaches.  Endo/Heme/Allergies: Negative for environmental allergies and polydipsia. Does not bruise/bleed easily.  Psychiatric/Behavioral: Negative for depression and suicidal ideas. The patient is not nervous/anxious and does not have insomnia.      Objective  Vitals:   03/14/16 1514  BP: 130/62  Pulse: 64  Weight: 135 lb (61.2 kg)  Height: 5\' 6"  (1.676 m)    Physical Exam  Constitutional: She is well-developed, well-nourished, and in no distress. No distress.  HENT:  Head: Normocephalic and atraumatic.  Right Ear: External ear normal.  Left Ear: External ear normal.  Nose: Nose normal.  Mouth/Throat: Oropharynx is clear and moist.  Eyes: Conjunctivae and EOM are normal. Pupils are equal, round, and reactive to light. Right eye exhibits no discharge. Left eye exhibits no discharge.  Neck: Normal range of motion. Neck supple. No JVD present. No thyromegaly present.  Cardiovascular: Normal rate, regular rhythm, normal heart sounds  and intact distal pulses.  Exam reveals no gallop and no friction rub.   No murmur heard. Pulmonary/Chest: Effort normal and breath sounds normal. She has no wheezes. She has no rales.  Abdominal: Soft. Bowel sounds are normal. She exhibits no mass. There is no tenderness. There is no guarding.  Musculoskeletal: Normal range of motion. She exhibits no edema.  Lymphadenopathy:    She has no cervical  adenopathy.  Neurological: She is alert. She has normal reflexes.  Skin: Skin is warm and dry. She is not diaphoretic.  Psychiatric: Mood and affect normal.  Nursing note and vitals reviewed.     Assessment & Plan  Problem List Items Addressed This Visit    None    Visit Diagnoses    Pharyngitis, unspecified etiology    -  Primary   Relevant Medications   amoxicillin (AMOXIL) 500 MG capsule   Bronchitis       Relevant Medications   amoxicillin (AMOXIL) 500 MG capsule   guaiFENesin-codeine (ROBITUSSIN AC) 100-10 MG/5ML syrup        Dr. Macon [facility]  03/14/16

## 2016-03-22 ENCOUNTER — Encounter: Payer: Self-pay | Admitting: Family Medicine

## 2016-03-22 ENCOUNTER — Ambulatory Visit (INDEPENDENT_AMBULATORY_CARE_PROVIDER_SITE_OTHER): Payer: Medicare Other | Admitting: Family Medicine

## 2016-03-22 VITALS — BP 120/70 | HR 64 | Ht 66.0 in | Wt 136.0 lb

## 2016-03-22 DIAGNOSIS — J0101 Acute recurrent maxillary sinusitis: Secondary | ICD-10-CM

## 2016-03-22 MED ORDER — LEVOFLOXACIN 500 MG PO TABS: 500.0000 mg | ORAL_TABLET | Freq: Every day | ORAL | 0 refills | Status: DC

## 2016-03-22 NOTE — Progress Notes (Signed)
Name: Phyllis Ross   MRN: [id number]    DOB: 1935/07/01   Date:03/22/2016       Progress Note  Subjective  Chief Complaint  Chief Complaint  Patient presents with  . Sinusitis    follow up to sinusitis- is on day 8 of Amoxil- feels some better, but still has alot of "head congestion"    Sinusitis  This is a new problem. The current episode started 1 to 4 weeks ago. The problem has been waxing and waning since onset. The maximum temperature recorded prior to her arrival was 100.4 - 100.9 F (sunday). The pain is moderate. Associated symptoms include congestion, headaches, sinus pressure and sneezing. Pertinent negatives include no chills, coughing, diaphoresis, ear pain, hoarse voice, neck pain, shortness of breath, sore throat or swollen glands. Past treatments include antibiotics (expectorant). The treatment provided mild relief.    No problem-specific Assessment & Plan notes found for this encounter.   Past Medical History:  Diagnosis Date  . Anxiety   . Cancer ([place])   . Hyperlipidemia   . Hypertension   . MI (myocardial infarction)   . Osteoporosis   . Rheumatic fever     Past Surgical History:  Procedure Laterality Date  . COLONOSCOPY  2011   normal- Dr Vira Agar  . CORONARY ARTERY BYPASS GRAFT    . VAGINAL HYSTERECTOMY     DUB    No family history on file.  Social History   Social History  . Marital status: Married    Spouse name: N/A  . Number of children: N/A  . Years of education: N/A   Occupational History  . Not on file.   Social History Main Topics  . Smoking status: Never Smoker  . Smokeless tobacco: Not on file  . Alcohol use No  . Drug use: Unknown  . Sexual activity: Yes   Other Topics Concern  . Not on file   Social History Narrative  . No narrative on file    No Known Allergies   Review of Systems  Constitutional: Negative for chills, diaphoresis, fever, malaise/fatigue and weight loss.  HENT: Positive for congestion, sinus  pressure and sneezing. Negative for ear discharge, ear pain, hoarse voice and sore throat.   Eyes: Negative for blurred vision.  Respiratory: Negative for cough, sputum production, shortness of breath and wheezing.   Cardiovascular: Negative for chest pain, palpitations and leg swelling.  Gastrointestinal: Negative for abdominal pain, blood in stool, constipation, diarrhea, heartburn, melena and nausea.  Genitourinary: Negative for dysuria, frequency, hematuria and urgency.  Musculoskeletal: Negative for back pain, joint pain, myalgias and neck pain.  Skin: Negative for rash.  Neurological: Positive for headaches. Negative for dizziness, tingling, sensory change and focal weakness.  Endo/Heme/Allergies: Negative for environmental allergies and polydipsia. Does not bruise/bleed easily.  Psychiatric/Behavioral: Negative for depression and suicidal ideas. The patient is not nervous/anxious and does not have insomnia.      Objective  Vitals:   03/22/16 1435  BP: 120/70  Pulse: 64  Weight: 136 lb (61.7 kg)  Height: 5\' 6"  (1.676 m)    Physical Exam  Constitutional: She is well-developed, well-nourished, and in no distress. No distress.  HENT:  Head: Normocephalic and atraumatic.  Right Ear: Tympanic membrane, external ear and ear canal normal.  Left Ear: Tympanic membrane, external ear and ear canal normal.  Nose: Nose normal. Right sinus exhibits no maxillary sinus tenderness and no frontal sinus tenderness. Left sinus exhibits no maxillary sinus tenderness and no frontal  sinus tenderness.  Mouth/Throat: Oropharynx is clear and moist.  Eyes: Conjunctivae and EOM are normal. Pupils are equal, round, and reactive to light. Right eye exhibits no discharge. Left eye exhibits no discharge.  Neck: Normal range of motion. Neck supple. No JVD present. No thyromegaly present.  Cardiovascular: Normal rate, regular rhythm, normal heart sounds and intact distal pulses.  Exam reveals no gallop and no  friction rub.   No murmur heard. Pulmonary/Chest: Effort normal and breath sounds normal. No respiratory distress. She has no wheezes. She has no rales.  Abdominal: Soft. Bowel sounds are normal. She exhibits no mass. There is no tenderness. There is no guarding.  Musculoskeletal: Normal range of motion. She exhibits no edema.  Lymphadenopathy:    She has no cervical adenopathy.  Neurological: She is alert. She has normal reflexes.  Skin: Skin is warm and dry. She is not diaphoretic.  Psychiatric: Mood and affect normal.  Nursing note and vitals reviewed.     Assessment & Plan  Problem List Items Addressed This Visit    None    Visit Diagnoses    Acute recurrent maxillary sinusitis    -  Primary   Relevant Medications   levofloxacin (LEVAQUIN) 500 MG tablet        Dr. [facility]  03/22/16

## 2016-04-26 ENCOUNTER — Other Ambulatory Visit: Payer: Self-pay

## 2016-05-18 ENCOUNTER — Other Ambulatory Visit: Payer: Self-pay

## 2016-05-18 ENCOUNTER — Inpatient Hospital Stay: Payer: Medicare Other | Attending: Obstetrics and Gynecology | Admitting: Obstetrics and Gynecology

## 2016-05-18 ENCOUNTER — Inpatient Hospital Stay: Payer: Medicare Other

## 2016-05-18 VITALS — BP 109/68 | HR 63 | Temp 97.2°F | Resp 18 | Ht 66.0 in | Wt 137.3 lb

## 2016-05-18 DIAGNOSIS — I10 Essential (primary) hypertension: Secondary | ICD-10-CM

## 2016-05-18 DIAGNOSIS — E784 Other hyperlipidemia: Secondary | ICD-10-CM | POA: Diagnosis not present

## 2016-05-18 DIAGNOSIS — Z9221 Personal history of antineoplastic chemotherapy: Secondary | ICD-10-CM | POA: Diagnosis not present

## 2016-05-18 DIAGNOSIS — Z90710 Acquired absence of both cervix and uterus: Secondary | ICD-10-CM | POA: Diagnosis not present

## 2016-05-18 DIAGNOSIS — Z79899 Other long term (current) drug therapy: Secondary | ICD-10-CM

## 2016-05-18 DIAGNOSIS — I071 Rheumatic tricuspid insufficiency: Secondary | ICD-10-CM | POA: Diagnosis not present

## 2016-05-18 DIAGNOSIS — Z7982 Long term (current) use of aspirin: Secondary | ICD-10-CM

## 2016-05-18 DIAGNOSIS — I2510 Atherosclerotic heart disease of native coronary artery without angina pectoris: Secondary | ICD-10-CM

## 2016-05-18 DIAGNOSIS — Z951 Presence of aortocoronary bypass graft: Secondary | ICD-10-CM

## 2016-05-18 DIAGNOSIS — Z8543 Personal history of malignant neoplasm of ovary: Secondary | ICD-10-CM | POA: Diagnosis not present

## 2016-05-18 DIAGNOSIS — R001 Bradycardia, unspecified: Secondary | ICD-10-CM | POA: Diagnosis not present

## 2016-05-18 DIAGNOSIS — I340 Nonrheumatic mitral (valve) insufficiency: Secondary | ICD-10-CM | POA: Diagnosis not present

## 2016-05-18 DIAGNOSIS — F419 Anxiety disorder, unspecified: Secondary | ICD-10-CM | POA: Diagnosis not present

## 2016-05-18 DIAGNOSIS — E782 Mixed hyperlipidemia: Secondary | ICD-10-CM

## 2016-05-18 NOTE — Progress Notes (Signed)
Gynecologic Oncology Visit    Chief Concern: Ovarian cancer surveillance  Subjective:  Phyllis Ross is a 81 y.o. female who has a history of stage IIIb ovarian adenocarcinoma s/p surgical cytoreduction in 10/2003 followed by paclitaxel and carboplatin chemotherapy completed 04/2004.     Last CA125 05/2015 22.8. No concerning symptoms today. She wants to learn more about her side effects from chemotherapy.   Gynecologic Oncology History Phyllis Ross is a pleasant female who has a history of stage IIIb ovarian adenocarcinoma s/p surgical cytoreduction in 10/2003 followed by paclitaxel and carboplatin chemotherapy completed 04/2004. Serial CA125 negative.   March 2014 28.06 Feb 2014 BI-RADS CATEGORY  1: Negative. Per her report she also had a mammogram last year that was negative. Ordered by Dr. Ronnald Ramp. She did not have a mammogram since b/c of discomfort with the exam.   Problem List: Patient Active Problem List   Diagnosis Date Noted  . History of ovarian cancer 05/20/2015  . 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  . Wedging of vertebra ([place]) 10/13/2014  . Polypharmacy 10/13/2014  . Encounter for general adult medical examination without abnormal findings 10/13/2014  . Anxiety 10/13/2014  . Benign essential HTN 08/27/2014  . Bradycardia 08/27/2014  . Arteriosclerosis of coronary artery 08/27/2014  . MI (mitral incompetence) 08/27/2014  . TI (tricuspid incompetence) 08/27/2014  . Combined fat and carbohydrate induced hyperlipemia 08/13/2014  . HTN (hypertension), malignant 08/11/2014  . Chest pain 08/11/2014  . CAD (coronary artery disease) of artery bypass graft 08/11/2014    Past Medical History: Past Medical History:  Diagnosis Date  . Anxiety   . Cancer ([place])   . Hyperlipidemia   . Hypertension   . MI (myocardial infarction)   . Osteoporosis   . Rheumatic fever     Past Surgical History: Past Surgical History:  Procedure Laterality Date   . COLONOSCOPY  2011   normal- Dr Vira Agar  . CORONARY ARTERY BYPASS GRAFT    . VAGINAL HYSTERECTOMY     DUB    Past Gynecologic History:  As per Oncology history  OB History:  OB History  No data available    Family History: Family History  Problem Relation Age of Onset  . Heart attack Mother   . Heart attack Maternal Uncle   . Heart attack Maternal Uncle     Social History: Social History   Social History  . Marital status: Married    Spouse name: N/A  . Number of children: N/A  . Years of education: N/A   Occupational History  . Not on file.   Social History Main Topics  . Smoking status: Never Smoker  . Smokeless tobacco: Not on file  . Alcohol use No  . Drug use: Unknown  . Sexual activity: Yes   Other Topics Concern  . Not on file   Social History Narrative  . No narrative on file    Allergies: No Known Allergies  Current Medications: Current Outpatient Prescriptions  Medication Sig Dispense Refill  . aspirin EC 81 MG tablet Take 1 tablet (81 mg total) by mouth daily. 30 tablet 11  . atorvastatin (LIPITOR) 80 MG tablet TAKE ONE TABLET AT BEDTIME. 30 tablet 5  . ibandronate (BONIVA) 150 MG tablet Take 1 tablet (150 mg total) by mouth every 30 (thirty) days. 1 tablet 11  . lisinopril (PRINIVIL,ZESTRIL) 10 MG tablet TAKE TWO TABLETS DAILY AS DIRECTED BY PHYSICIAN 60 tablet 5  . nitroGLYCERIN (NITROSTAT) 0.4 MG SL tablet Place 1  tablet under the tongue as needed.     No current facility-administered medications for this visit.     Review of Systems General: no complaints  HEENT: no complaints  Lungs: no complaints  Cardiac: no complaints  GI: no complaints  GU: no complaints  Musculoskeletal: no complaints  Extremities: no complaints  Skin: no complaints  Neuro: no complaints  Endocrine: no complaints  Psych: no complaints      Objective:  Physical Examination:  BP 109/68   Pulse 63   Temp 97.2 F (36.2 C) (Oral)   Resp 18   Ht  5\' 6"  (1.676 m)   Wt 137 lb 5.6 oz (62.3 kg)   BMI 22.17 kg/m    ECOG Performance Status: 0 - Asymptomatic  General appearance: alert, cooperative and appears stated age HEENT:PERRLA, extra ocular movement intact and sclera clear, anicteric Lymph node survey: non-palpable, axillary, inguinal, supraclavicular Cardiovascular: regular rate and rhythm Respiratory: normal air entry, lungs clear to auscultation Abdomen: soft, non-tender, without masses or organomegaly, no hernias and well healed incision Back: inspection of back is normal Extremities: extremities normal, atraumatic, no cyanosis or edema Skin exam - numerous actinic skin lesions present Neurological exam reveals alert, oriented, normal speech, no focal findings or movement disorder noted.  Pelvic: exam chaperoned by nurse;  Vulva: normal appearing vulva with no masses, tenderness or lesions; Vagina: normal vagina; Adnexa: no masses, surgically absent bilateral; Uterus: surgically absent, vaginal cuff well healed; Cervix: absent; Rectal: normal rectal, no masses    Lab Review Labs on site today: CA125 pending  Radiologic Imaging: None    Assessment:  Phyllis Ross is a 81 y.o. female diagnosed with history of stage IIIB  ovarian cancer, clinically NED.  Plan:   Problem List Items Addressed This Visit      Genitourinary   History of ovarian cancer - Primary   Relevant Orders   CA 125     I recommended CA125 today. If negative I suggested return to clinic in  1 year.    I provided her with written information about paclitaxel and carboplatin side effects.   She will discuss screening mammograms and colonoscopy with her PCP to review current guidelines.   Gillis Ends, MD    CC:  Juline Patch, MD [address] [place], [place] [address] [phone]

## 2016-05-18 NOTE — Progress Notes (Signed)
Pt  Wants to know what chemo she took a long time ago so that we can tell her any poss. Long term side effects to look for

## 2016-05-18 NOTE — Patient Instructions (Signed)

## 2016-05-25 ENCOUNTER — Inpatient Hospital Stay: Payer: Medicare Other

## 2016-05-25 ENCOUNTER — Ambulatory Visit: Payer: Medicare Other

## 2016-05-25 DIAGNOSIS — Z8543 Personal history of malignant neoplasm of ovary: Secondary | ICD-10-CM

## 2016-05-26 ENCOUNTER — Telehealth: Payer: Self-pay

## 2016-05-26 LAB — CA 125: CA 125: 20.5 U/mL (ref 0.0–38.1)

## 2016-05-26 NOTE — Telephone Encounter (Signed)
  Oncology Nurse Navigator Documentation Notified of CA 125 results  CA 125 0.0 - 38.1 U/mL 20.5     Navigator Location: [facility] (05/26/16 1500)   )Navigator Encounter Type: Diagnostic Results (05/26/16 1500)                                                    Time Spent with Patient: 15 (05/26/16 1500)

## 2016-06-16 ENCOUNTER — Encounter: Payer: Self-pay | Admitting: Family Medicine

## 2016-06-16 ENCOUNTER — Ambulatory Visit (INDEPENDENT_AMBULATORY_CARE_PROVIDER_SITE_OTHER): Payer: Medicare Other | Admitting: Family Medicine

## 2016-06-16 VITALS — BP 130/80 | HR 70 | Ht 66.0 in | Wt 136.0 lb

## 2016-06-16 DIAGNOSIS — E784 Other hyperlipidemia: Secondary | ICD-10-CM

## 2016-06-16 DIAGNOSIS — Z23 Encounter for immunization: Secondary | ICD-10-CM | POA: Diagnosis not present

## 2016-06-16 DIAGNOSIS — I25118 Atherosclerotic heart disease of native coronary artery with other forms of angina pectoris: Secondary | ICD-10-CM | POA: Diagnosis not present

## 2016-06-16 DIAGNOSIS — E7849 Other hyperlipidemia: Secondary | ICD-10-CM

## 2016-06-16 DIAGNOSIS — I10 Essential (primary) hypertension: Secondary | ICD-10-CM | POA: Diagnosis not present

## 2016-06-16 DIAGNOSIS — M810 Age-related osteoporosis without current pathological fracture: Secondary | ICD-10-CM | POA: Diagnosis not present

## 2016-06-16 MED ORDER — ATORVASTATIN CALCIUM 80 MG PO TABS: ORAL_TABLET | ORAL | 3 refills | Status: DC

## 2016-06-16 MED ORDER — LISINOPRIL 10 MG PO TABS: ORAL_TABLET | ORAL | 3 refills | Status: DC

## 2016-06-16 MED ORDER — IBANDRONATE SODIUM 150 MG PO TABS: 150.0000 mg | ORAL_TABLET | ORAL | 3 refills | Status: DC

## 2016-06-16 NOTE — Progress Notes (Signed)
Name: Phyllis Ross   MRN: [id number]    DOB: 18-Jul-1935   Date:06/16/2016       Progress Note  Subjective  Chief Complaint  Chief Complaint  Patient presents with  . Hyperlipidemia  . Hypertension  . Osteoporosis    Hyperlipidemia  This is a chronic problem. The current episode started more than 1 year ago. The problem is controlled. Recent lipid tests were reviewed and are normal. She has no history of chronic renal disease, diabetes, hypothyroidism, liver disease, obesity or nephrotic syndrome. There are no known factors aggravating her hyperlipidemia. Pertinent negatives include no chest pain, focal sensory loss, focal weakness, leg pain, myalgias or shortness of breath. Current antihyperlipidemic treatment includes statins. The current treatment provides moderate improvement of lipids. There are no compliance problems.  Risk factors for coronary artery disease include dyslipidemia and hypertension.  Hypertension  This is a chronic problem. The current episode started more than 1 year ago. The problem has been waxing and waning since onset. The problem is controlled. Pertinent negatives include no anxiety, blurred vision, chest pain, headaches, malaise/fatigue, neck pain, orthopnea, palpitations, peripheral edema, PND, shortness of breath or sweats. There are no associated agents to hypertension. Risk factors for coronary artery disease include dyslipidemia. Past treatments include ACE inhibitors. The current treatment provides moderate improvement. There are no compliance problems.  There is no history of angina, kidney disease, CAD/MI, CVA, heart failure, left ventricular hypertrophy, PVD or retinopathy. There is no history of chronic renal disease, a hypertension causing med or renovascular disease.  Heart Problem  This is a recurrent (hx of CAD) problem. The current episode started more than 1 year ago. The problem has been unchanged. Pertinent negatives include no abdominal pain, chest  pain, chills, congestion, coughing, fever, headaches, myalgias, nausea, neck pain, rash or sore throat. Treatments tried: prn NTG. The treatment provided mild relief.  Other  Chronicity: osteoporosis. The current episode started more than 1 year ago. The problem has been unchanged. Pertinent negatives include no abdominal pain, chest pain, chills, congestion, coughing, fever, headaches, myalgias, nausea, neck pain, rash or sore throat. Nothing aggravates the symptoms. The treatment provided mild relief.    No problem-specific Assessment & Plan notes found for this encounter.   Past Medical History:  Diagnosis Date  . Anxiety   . Cancer ([place])   . Hyperlipidemia   . Hypertension   . MI (myocardial infarction)   . Osteoporosis   . Rheumatic fever     Past Surgical History:  Procedure Laterality Date  . COLONOSCOPY  2011   normal- Dr Vira Agar  . CORONARY ARTERY BYPASS GRAFT    . VAGINAL HYSTERECTOMY     DUB    Family History  Problem Relation Age of Onset  . Heart attack Mother   . Heart attack Maternal Uncle   . Heart attack Maternal Uncle     Social History   Social History  . Marital status: Married    Spouse name: N/A  . Number of children: N/A  . Years of education: N/A   Occupational History  . Not on file.   Social History Main Topics  . Smoking status: Never Smoker  . Smokeless tobacco: Never Used  . Alcohol use No  . Drug use: Unknown  . Sexual activity: Yes   Other Topics Concern  . Not on file   Social History Narrative  . No narrative on file    No Known Allergies  Outpatient Medications Prior to Visit  Medication Sig Dispense Refill  . aspirin EC 81 MG tablet Take 1 tablet (81 mg total) by mouth daily. 30 tablet 11  . nitroGLYCERIN (NITROSTAT) 0.4 MG SL tablet Place 1 tablet under the tongue as needed.    Marland Kitchen atorvastatin (LIPITOR) 80 MG tablet TAKE ONE TABLET AT BEDTIME. 30 tablet 5  . ibandronate (BONIVA) 150 MG tablet Take 1 tablet (150 mg  total) by mouth every 30 (thirty) days. 1 tablet 11  . lisinopril (PRINIVIL,ZESTRIL) 10 MG tablet TAKE TWO TABLETS DAILY AS DIRECTED BY PHYSICIAN 60 tablet 5   No facility-administered medications prior to visit.     Review of Systems  Constitutional: Negative for chills, fever, malaise/fatigue and weight loss.  HENT: Negative for congestion, ear discharge, ear pain and sore throat.   Eyes: Negative for blurred vision.  Respiratory: Negative for cough, sputum production, shortness of breath and wheezing.   Cardiovascular: Negative for chest pain, palpitations, orthopnea, leg swelling and PND.  Gastrointestinal: Negative for abdominal pain, blood in stool, constipation, diarrhea, heartburn, melena and nausea.  Genitourinary: Negative for dysuria, frequency, hematuria and urgency.  Musculoskeletal: Negative for back pain, joint pain, myalgias and neck pain.  Skin: Negative for rash.  Neurological: Negative for dizziness, tingling, sensory change, focal weakness and headaches.  Endo/Heme/Allergies: Negative for environmental allergies and polydipsia. Does not bruise/bleed easily.  Psychiatric/Behavioral: Negative for depression and suicidal ideas. The patient is not nervous/anxious and does not have insomnia.      Objective  Vitals:   06/16/16 0853  BP: 130/80  Pulse: 70  Weight: 136 lb (61.7 kg)  Height: 5\' 6"  (1.676 m)    Physical Exam  Constitutional: She is well-developed, well-nourished, and in no distress. No distress.  HENT:  Head: Normocephalic and atraumatic.  Right Ear: External ear normal.  Left Ear: External ear normal.  Nose: Nose normal.  Mouth/Throat: Oropharynx is clear and moist.  Eyes: Conjunctivae and EOM are normal. Pupils are equal, round, and reactive to light. Right eye exhibits no discharge. Left eye exhibits no discharge.  Neck: Normal range of motion. Neck supple. No JVD present. No thyromegaly present.  Cardiovascular: Normal rate, regular rhythm,  normal heart sounds and intact distal pulses.  Exam reveals no gallop and no friction rub.   No murmur heard. Pulmonary/Chest: Effort normal and breath sounds normal. She has no wheezes. She has no rales.  Abdominal: Soft. Bowel sounds are normal. She exhibits no mass. There is no tenderness. There is no guarding.  Musculoskeletal: Normal range of motion. She exhibits no edema.  Lymphadenopathy:    She has no cervical adenopathy.  Neurological: She is alert.  Skin: Skin is warm and dry. She is not diaphoretic.  Psychiatric: Mood and affect normal.  Nursing note and vitals reviewed.     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) - Primary   Relevant Medications   atorvastatin (LIPITOR) 80 MG tablet   lisinopril (PRINIVIL,ZESTRIL) 10 MG tablet     Musculoskeletal and Integument   Age-related osteoporosis without current pathological fracture   Relevant Medications   ibandronate (BONIVA) 150 MG tablet     Other   Familial multiple lipoprotein-type hyperlipidemia  Relevant Medications   atorvastatin (LIPITOR) 80 MG tablet   lisinopril (PRINIVIL,ZESTRIL) 10 MG tablet   Other Relevant Orders   Lipid Profile    Other Visit Diagnoses    Need for diphtheria-tetanus-pertussis (Tdap) vaccine       Relevant Orders   Tdap vaccine greater than or equal to 7yo IM (Completed)      Meds ordered this encounter  Medications  . ibandronate (BONIVA) 150 MG tablet    Sig: Take 1 tablet (150 mg total) by mouth every 30 (thirty) days.    Dispense:  3 tablet    Refill:  3  . atorvastatin (LIPITOR) 80 MG tablet    Sig: TAKE ONE TABLET AT BEDTIME.    Dispense:  90 tablet    Refill:   3  . lisinopril (PRINIVIL,ZESTRIL) 10 MG tablet    Sig: TAKE TWO TABLETS DAILY AS DIRECTED BY PHYSICIAN    Dispense:  180 tablet    Refill:  3      Dr. Deanna Jones [facility]  06/16/16

## 2016-06-17 LAB — LIPID PANEL
CHOLESTEROL TOTAL: 175 mg/dL (ref 100–199)
Chol/HDL Ratio: 3.6 ratio units (ref 0.0–4.4)
HDL: 48 mg/dL (ref >39)
LDL CALC: 98 mg/dL (ref 0–99)
TRIGLYCERIDES: 143 mg/dL (ref 0–149)
VLDL CHOLESTEROL CAL: 29 mg/dL (ref 5–40)

## 2016-06-17 LAB — RENAL FUNCTION PANEL
ALBUMIN: 4.3 g/dL (ref 3.5–4.8)
BUN/Creatinine Ratio: 22 (ref 12–28)
BUN: 21 mg/dL (ref 8–27)
CALCIUM: 9.4 mg/dL (ref 8.7–10.3)
CO2: 29 mmol/L (ref 18–29)
Chloride: 101 mmol/L (ref 96–106)
Creatinine, Ser: 0.97 mg/dL (ref 0.57–1.00)
GFR calc non Af Amer: 56 mL/min/{1.73_m2} — ABNORMAL LOW (ref >59)
GFR, EST AFRICAN AMERICAN: 65 mL/min/{1.73_m2} (ref >59)
GLUCOSE: 88 mg/dL (ref 65–99)
POTASSIUM: 5.3 mmol/L — AB (ref 3.5–5.2)
Phosphorus: 3.7 mg/dL (ref 2.5–4.5)
Sodium: 143 mmol/L (ref 134–144)

## 2016-06-17 NOTE — Addendum Note (Signed)
Addended by: Fredderick Severance on: 06/17/2016 09:17 AM   Modules accepted: Orders

## 2016-06-24 ENCOUNTER — Ambulatory Visit (INDEPENDENT_AMBULATORY_CARE_PROVIDER_SITE_OTHER): Payer: Medicare Other | Admitting: Family Medicine

## 2016-06-24 VITALS — BP 120/60 | HR 72 | Ht 66.0 in | Wt 136.0 lb

## 2016-06-24 DIAGNOSIS — M791 Myalgia, unspecified site: Secondary | ICD-10-CM

## 2016-06-24 MED ORDER — ETODOLAC 500 MG PO TABS: 500.0000 mg | ORAL_TABLET | Freq: Two times a day (BID) | ORAL | 1 refills | Status: DC

## 2016-06-24 NOTE — Progress Notes (Signed)
Name: Phyllis Ross   MRN: [id number]    DOB: 08-18-1935   Date:06/24/2016       Progress Note  Subjective  Chief Complaint  Chief Complaint  Patient presents with  . body pain    had a tetanus shot on 06/16/16- L) side of body including side, head, leg    Muscle Pain  This is a new problem. The current episode started in the past 7 days. The problem occurs daily. The problem is unchanged. Pain location: left sided. The pain is mild. Pertinent negatives include no abdominal pain, chest pain, constipation, diarrhea, dysuria, fatigue, fever, headaches, joint swelling, nausea, rash, stiffness, sensory change, shortness of breath, urinary symptoms, weakness or wheezing. Treatments tried: aspirin. There is no swelling present. She has been behaving normally (able to play pickle ball). There is no history of chronic back pain, rheumatic disease or sickle cell disease.    No problem-specific Assessment & Plan notes found for this encounter.   Past Medical History:  Diagnosis Date  . Anxiety   . Cancer ([place])   . Hyperlipidemia   . Hypertension   . MI (myocardial infarction)   . Osteoporosis   . Rheumatic fever     Past Surgical History:  Procedure Laterality Date  . COLONOSCOPY  2011   normal- Dr Vira Agar  . CORONARY ARTERY BYPASS GRAFT    . VAGINAL HYSTERECTOMY     DUB    Family History  Problem Relation Age of Onset  . Heart attack Mother   . Heart attack Maternal Uncle   . Heart attack Maternal Uncle     Social History   Social History  . Marital status: Married    Spouse name: N/A  . Number of children: N/A  . Years of education: N/A   Occupational History  . Not on file.   Social History Main Topics  . Smoking status: Never Smoker  . Smokeless tobacco: Never Used  . Alcohol use No  . Drug use: Unknown  . Sexual activity: Yes   Other Topics Concern  . Not on file   Social History Narrative  . No narrative on file    No Known Allergies  Outpatient  Medications Prior to Visit  Medication Sig Dispense Refill  . aspirin EC 81 MG tablet Take 1 tablet (81 mg total) by mouth daily. 30 tablet 11  . atorvastatin (LIPITOR) 80 MG tablet TAKE ONE TABLET AT BEDTIME. 90 tablet 3  . lisinopril (PRINIVIL,ZESTRIL) 10 MG tablet TAKE TWO TABLETS DAILY AS DIRECTED BY PHYSICIAN 180 tablet 3  . nitroGLYCERIN (NITROSTAT) 0.4 MG SL tablet Place 1 tablet under the tongue as needed.     No facility-administered medications prior to visit.     Review of Systems  Constitutional: Negative for chills, fatigue, fever, malaise/fatigue and weight loss.  HENT: Negative for ear discharge, ear pain and sore throat.   Eyes: Negative for blurred vision.  Respiratory: Negative for cough, sputum production, shortness of breath and wheezing.   Cardiovascular: Negative for chest pain, palpitations and leg swelling.  Gastrointestinal: Negative for abdominal pain, blood in stool, constipation, diarrhea, heartburn, melena and nausea.  Genitourinary: Negative for dysuria, frequency, hematuria and urgency.  Musculoskeletal: Negative for back pain, joint pain, joint swelling, myalgias, neck pain and stiffness.  Skin: Negative for rash.  Neurological: Negative for dizziness, tingling, sensory change, focal weakness, weakness and headaches.  Endo/Heme/Allergies: Negative for environmental allergies and polydipsia. Does not bruise/bleed easily.  Psychiatric/Behavioral: Negative for depression and  suicidal ideas. The patient is not nervous/anxious and does not have insomnia.      Objective  Vitals:   06/24/16 0937  BP: 120/60  Pulse: 72  Weight: 136 lb (61.7 kg)  Height: 5\' 6"  (1.676 m)    Physical Exam  Constitutional: She is well-developed, well-nourished, and in no distress. No distress.  HENT:  Head: Normocephalic and atraumatic.  Right Ear: External ear normal.  Left Ear: External ear normal.  Nose: Nose normal.  Mouth/Throat: Oropharynx is clear and moist.   Eyes: Conjunctivae and EOM are normal. Pupils are equal, round, and reactive to light. Right eye exhibits no discharge. Left eye exhibits no discharge.  Neck: Normal range of motion. Neck supple. No JVD present. No thyromegaly present.  Cardiovascular: Normal rate, regular rhythm, normal heart sounds and intact distal pulses.  Exam reveals no gallop and no friction rub.   No murmur heard. Pulmonary/Chest: Effort normal and breath sounds normal. She has no wheezes. She has no rales.  Abdominal: Soft. Bowel sounds are normal. She exhibits no mass. There is no tenderness. There is no guarding.  Musculoskeletal: Normal range of motion. She exhibits no edema.  Lymphadenopathy:    She has no cervical adenopathy.  Neurological: She is alert. She has normal reflexes.  Skin: Skin is warm and dry. She is not diaphoretic.  Psychiatric: Mood and affect normal.  Nursing note and vitals reviewed.     Assessment & Plan  Problem List Items Addressed This Visit    None    Visit Diagnoses    Myalgia    -  Primary   Relevant Medications   etodolac (LODINE) 500 MG tablet      Meds ordered this encounter  Medications  . etodolac (LODINE) 500 MG tablet    Sig: Take 1 tablet (500 mg total) by mouth 2 (two) times daily.    Dispense:  60 tablet    Refill:  1      Dr. Otilio Miu [facility]  06/24/16

## 2016-06-29 ENCOUNTER — Ambulatory Visit: Payer: Medicare Other | Admitting: Family Medicine

## 2016-07-28 ENCOUNTER — Ambulatory Visit (INDEPENDENT_AMBULATORY_CARE_PROVIDER_SITE_OTHER): Payer: Medicare Other | Admitting: Family Medicine

## 2016-07-28 ENCOUNTER — Encounter: Payer: Self-pay | Admitting: Family Medicine

## 2016-07-28 VITALS — BP 120/80 | HR 78 | Ht 66.0 in | Wt 135.0 lb

## 2016-07-28 DIAGNOSIS — J0100 Acute maxillary sinusitis, unspecified: Secondary | ICD-10-CM | POA: Diagnosis not present

## 2016-07-28 DIAGNOSIS — J029 Acute pharyngitis, unspecified: Secondary | ICD-10-CM

## 2016-07-28 MED ORDER — AZITHROMYCIN 250 MG PO TABS: ORAL_TABLET | ORAL | 0 refills | Status: DC

## 2016-07-28 NOTE — Progress Notes (Signed)
Name: Phyllis Ross   MRN: [id number]    DOB: 1936-02-24   Date:07/28/2016       Progress Note  Subjective  Chief Complaint  Chief Complaint  Patient presents with  . Sore Throat    cough- no production    Sore Throat   This is a new problem. The current episode started in the past 7 days (day before yesterday). The problem has been gradually worsening. Neither side of throat is experiencing more pain than the other. There has been no fever. The pain is mild. Pertinent negatives include no abdominal pain, congestion, coughing, diarrhea, drooling, ear discharge, ear pain, headaches, hoarse voice, plugged ear sensation, neck pain, shortness of breath, stridor, swollen glands, trouble swallowing or vomiting. The treatment provided mild relief.    No problem-specific Assessment & Plan notes found for this encounter.   Past Medical History:  Diagnosis Date  . Anxiety   . Cancer ([place])   . Hyperlipidemia   . Hypertension   . MI (myocardial infarction) ([place])   . Osteoporosis   . Rheumatic fever     Past Surgical History:  Procedure Laterality Date  . COLONOSCOPY  2011   normal- Dr Vira Agar  . CORONARY ARTERY BYPASS GRAFT    . VAGINAL HYSTERECTOMY     DUB    Family History  Problem Relation Age of Onset  . Heart attack Mother   . Heart attack Maternal Uncle   . Heart attack Maternal Uncle     Social History   Social History  . Marital status: Married    Spouse name: N/A  . Number of children: N/A  . Years of education: N/A   Occupational History  . Not on file.   Social History Main Topics  . Smoking status: Never Smoker  . Smokeless tobacco: Never Used  . Alcohol use No  . Drug use: Unknown  . Sexual activity: Yes   Other Topics Concern  . Not on file   Social History Narrative  . No narrative on file    No Known Allergies  Outpatient Medications Prior to Visit  Medication Sig Dispense Refill  . aspirin EC 81 MG tablet Take 1 tablet (81 mg total)  by mouth daily. 30 tablet 11  . atorvastatin (LIPITOR) 80 MG tablet TAKE ONE TABLET AT BEDTIME. 90 tablet 3  . lisinopril (PRINIVIL,ZESTRIL) 10 MG tablet TAKE TWO TABLETS DAILY AS DIRECTED BY PHYSICIAN 180 tablet 3  . nitroGLYCERIN (NITROSTAT) 0.4 MG SL tablet Place 1 tablet under the tongue as needed.    . etodolac (LODINE) 500 MG tablet Take 1 tablet (500 mg total) by mouth 2 (two) times daily. (Patient not taking: Reported on 07/28/2016) 60 tablet 1   No facility-administered medications prior to visit.     Review of Systems  Constitutional: Negative for chills, fever, malaise/fatigue and weight loss.  HENT: Negative for congestion, drooling, ear discharge, ear pain, hoarse voice, sore throat and trouble swallowing.   Eyes: Negative for blurred vision.  Respiratory: Negative for cough, sputum production, shortness of breath, wheezing and stridor.   Cardiovascular: Negative for chest pain, palpitations and leg swelling.  Gastrointestinal: Negative for abdominal pain, blood in stool, constipation, diarrhea, heartburn, melena, nausea and vomiting.  Genitourinary: Negative for dysuria, frequency, hematuria and urgency.  Musculoskeletal: Negative for back pain, joint pain, myalgias and neck pain.  Skin: Negative for rash.  Neurological: Negative for dizziness, tingling, sensory change, focal weakness and headaches.  Endo/Heme/Allergies: Negative for environmental allergies and  polydipsia. Does not bruise/bleed easily.  Psychiatric/Behavioral: Negative for depression and suicidal ideas. The patient is not nervous/anxious and does not have insomnia.      Objective  Vitals:   07/28/16 1342  BP: 120/80  Pulse: 78  Weight: 135 lb (61.2 kg)  Height: 5\' 6"  (1.676 m)    Physical Exam  Constitutional: She is well-developed, well-nourished, and in no distress. No distress.  HENT:  Head: Normocephalic and atraumatic.  Right Ear: External ear normal.  Left Ear: External ear normal.  Nose:  Nose normal.  Mouth/Throat: Oropharynx is clear and moist.  Eyes: Conjunctivae and EOM are normal. Pupils are equal, round, and reactive to light. Right eye exhibits no discharge. Left eye exhibits no discharge.  Neck: Normal range of motion. Neck supple. No JVD present. No thyromegaly present.  Cardiovascular: Normal rate, regular rhythm, normal heart sounds and intact distal pulses.  Exam reveals no gallop and no friction rub.   No murmur heard. Pulmonary/Chest: Effort normal and breath sounds normal. She has no wheezes. She has no rales.  Abdominal: Soft. Bowel sounds are normal. She exhibits no mass. There is no tenderness. There is no guarding.  Musculoskeletal: Normal range of motion. She exhibits no edema.  Lymphadenopathy:    She has no cervical adenopathy.  Neurological: She is alert.  Skin: Skin is warm and dry. She is not diaphoretic.  Psychiatric: Mood and affect normal.  Nursing note and vitals reviewed.     Assessment & Plan  Problem List Items Addressed This Visit    None    Visit Diagnoses    Acute maxillary sinusitis, recurrence not specified    -  Primary   Relevant Medications   azithromycin (ZITHROMAX) 250 MG tablet   Pharyngitis, unspecified etiology       Relevant Medications   azithromycin (ZITHROMAX) 250 MG tablet      Meds ordered this encounter  Medications  . azithromycin (ZITHROMAX) 250 MG tablet    Sig: 2 today then 1 a day for 4 days    Dispense:  6 tablet    Refill:  0      Dr. [facility]  07/28/16

## 2016-07-29 ENCOUNTER — Telehealth: Payer: Self-pay

## 2016-07-29 NOTE — Telephone Encounter (Signed)
Pt called in wanting Rob [facility] sent into pharmacy- called in Robitussin AC 1 teaspoon q 6-8 hrs PRN cough. 4 ounces

## 2016-07-29 NOTE — Telephone Encounter (Signed)
Called in to [facility]- notified pt

## 2016-08-01 ENCOUNTER — Other Ambulatory Visit: Payer: Self-pay

## 2016-08-02 ENCOUNTER — Other Ambulatory Visit: Payer: Self-pay

## 2016-11-07 ENCOUNTER — Ambulatory Visit: Payer: Medicare Other

## 2016-11-11 ENCOUNTER — Other Ambulatory Visit: Payer: Self-pay | Admitting: Family Medicine

## 2016-11-11 ENCOUNTER — Other Ambulatory Visit: Payer: Self-pay

## 2016-12-22 ENCOUNTER — Other Ambulatory Visit: Payer: Self-pay

## 2016-12-22 ENCOUNTER — Encounter: Payer: Self-pay | Admitting: Family Medicine

## 2016-12-22 ENCOUNTER — Ambulatory Visit (INDEPENDENT_AMBULATORY_CARE_PROVIDER_SITE_OTHER): Payer: Medicare Other | Admitting: Family Medicine

## 2016-12-22 VITALS — BP 120/70 | HR 72 | Ht 66.0 in | Wt 131.0 lb

## 2016-12-22 DIAGNOSIS — F4320 Adjustment disorder, unspecified: Secondary | ICD-10-CM

## 2016-12-22 DIAGNOSIS — F4321 Adjustment disorder with depressed mood: Secondary | ICD-10-CM

## 2016-12-22 DIAGNOSIS — F419 Anxiety disorder, unspecified: Secondary | ICD-10-CM | POA: Diagnosis not present

## 2016-12-22 MED ORDER — ALPRAZOLAM 0.25 MG PO TABS: 0.2500 mg | ORAL_TABLET | Freq: Two times a day (BID) | ORAL | 1 refills | Status: DC | PRN

## 2016-12-22 NOTE — Progress Notes (Signed)
Name: Phyllis Ross   MRN: [id number]    DOB: 1935/05/08   Date:12/22/2016       Progress Note  Subjective  Chief Complaint  Chief Complaint  Patient presents with  . Anxiety    close friend just passed- wants refill on alprazolam    Anxiety  Presents for follow-up visit. Symptoms include nervous/anxious behavior. Patient reports no chest pain, compulsions, confusion, decreased concentration, depressed mood, dizziness, dry mouth, excessive worry, feeling of choking, hyperventilation, impotence, insomnia, irritability, malaise, muscle tension, nausea, obsessions, palpitations, panic, restlessness, shortness of breath or suicidal ideas. Symptoms occur constantly. The severity of symptoms is moderate. The quality of sleep is fair.      No problem-specific Assessment & Plan notes found for this encounter.   Past Medical History:  Diagnosis Date  . Anxiety   . Cancer ([place])   . Hyperlipidemia   . Hypertension   . MI (myocardial infarction) ([place])   . Osteoporosis   . Rheumatic fever     Past Surgical History:  Procedure Laterality Date  . COLONOSCOPY  2011   normal- Dr Vira Agar  . CORONARY ARTERY BYPASS GRAFT    . VAGINAL HYSTERECTOMY     DUB    Family History  Problem Relation Age of Onset  . Heart attack Mother   . Heart attack Maternal Uncle   . Heart attack Maternal Uncle     Social History   Social History  . Marital status: Married    Spouse name: N/A  . Number of children: N/A  . Years of education: N/A   Occupational History  . Not on file.   Social History Main Topics  . Smoking status: Never Smoker  . Smokeless tobacco: Never Used  . Alcohol use No  . Drug use: Unknown  . Sexual activity: Yes   Other Topics Concern  . Not on file   Social History Narrative  . No narrative on file    No Known Allergies  Outpatient Medications Prior to Visit  Medication Sig Dispense Refill  . aspirin EC 81 MG tablet Take 1 tablet (81 mg total) by mouth  daily. 30 tablet 11  . atorvastatin (LIPITOR) 80 MG tablet TAKE ONE TABLET AT BEDTIME. 90 tablet 3  . lisinopril (PRINIVIL,ZESTRIL) 10 MG tablet TAKE TWO TABLETS DAILY AS DIRECTED BY PHYSICIAN 180 tablet 3  . NITROSTAT 0.4 MG SL tablet 1 TABLET UNDER TONGUE EVERY 5 MINUTES AS NEEDED FOR CHEST PAIN. MAY REPEAT UP TO 3 DOSES, IF NO RELIEF GO TO HOSP.. 25 tablet 3  . etodolac (LODINE) 500 MG tablet Take 1 tablet (500 mg total) by mouth 2 (two) times daily. (Patient not taking: Reported on 07/28/2016) 60 tablet 1  . azithromycin (ZITHROMAX) 250 MG tablet 2 today then 1 a day for 4 days 6 tablet 0   No facility-administered medications prior to visit.     Review of Systems  Constitutional: Negative for chills, fever, irritability, malaise/fatigue and weight loss.  HENT: Negative for ear discharge, ear pain and sore throat.   Eyes: Negative for blurred vision.  Respiratory: Negative for cough, sputum production, shortness of breath and wheezing.   Cardiovascular: Negative for chest pain, palpitations and leg swelling.  Gastrointestinal: Negative for abdominal pain, blood in stool, constipation, diarrhea, heartburn, melena and nausea.  Genitourinary: Negative for dysuria, frequency, hematuria, impotence and urgency.  Musculoskeletal: Negative for back pain, joint pain, myalgias and neck pain.  Skin: Negative for rash.  Neurological: Negative for dizziness, tingling,  sensory change, focal weakness and headaches.  Endo/Heme/Allergies: Negative for environmental allergies and polydipsia. Does not bruise/bleed easily.  Psychiatric/Behavioral: Negative for confusion, decreased concentration, depression and suicidal ideas. The patient is nervous/anxious. The patient does not have insomnia.      Objective  Vitals:   12/22/16 1022  BP: 120/70  Pulse: 72  Weight: 131 lb (59.4 kg)  Height: 5\' 6"  (1.676 m)    Physical Exam  Constitutional: She is well-developed, well-nourished, and in no distress.  No distress.  HENT:  Head: Normocephalic and atraumatic.  Right Ear: External ear normal.  Left Ear: External ear normal.  Nose: Nose normal.  Mouth/Throat: Oropharynx is clear and moist.  Eyes: Pupils are equal, round, and reactive to light. Conjunctivae and EOM are normal. Right eye exhibits no discharge. Left eye exhibits no discharge.  Neck: Normal range of motion. Neck supple. No JVD present. No thyromegaly present.  Cardiovascular: Normal rate, regular rhythm, normal heart sounds and intact distal pulses.  Exam reveals no gallop and no friction rub.   No murmur heard. Pulmonary/Chest: Effort normal and breath sounds normal. She has no wheezes. She has no rales.  Abdominal: Soft. Bowel sounds are normal. She exhibits no mass. There is no tenderness. There is no rebound and no guarding.  Musculoskeletal: Normal range of motion. She exhibits no edema.  Lymphadenopathy:    She has no cervical adenopathy.  Neurological: She is alert. She has normal reflexes.  Skin: Skin is warm and dry. No rash noted. She is not diaphoretic.  Psychiatric: Mood and affect normal.  Nursing note and vitals reviewed.     Assessment & Plan  Problem List Items Addressed This Visit      Other   Anxiety - Primary   Relevant Medications   ALPRAZolam (XANAX) 0.25 MG tablet    Other Visit Diagnoses    Grief reaction       Relevant Medications   ALPRAZolam (XANAX) 0.25 MG tablet      Meds ordered this encounter  Medications  . ALPRAZolam (XANAX) 0.25 MG tablet    Sig: Take 1 tablet (0.25 mg total) by mouth 2 (two) times daily as needed for anxiety.    Dispense:  30 tablet    Refill:  1      Dr. Deanna Jones [facility]  12/22/16

## 2017-01-09 ENCOUNTER — Other Ambulatory Visit: Payer: Self-pay

## 2017-01-09 DIAGNOSIS — B379 Candidiasis, unspecified: Secondary | ICD-10-CM

## 2017-01-09 MED ORDER — CLOTRIMAZOLE-BETAMETHASONE 1-0.05 % EX CREA: 1.0000 "application " | TOPICAL_CREAM | Freq: Two times a day (BID) | CUTANEOUS | 0 refills | Status: DC

## 2017-02-13 ENCOUNTER — Ambulatory Visit (INDEPENDENT_AMBULATORY_CARE_PROVIDER_SITE_OTHER): Payer: Medicare Other

## 2017-02-13 VITALS — BP 120/60 | HR 70 | Temp 97.7°F | Resp 16 | Ht 66.0 in | Wt 132.4 lb

## 2017-02-13 DIAGNOSIS — Z0000 Encounter for general adult medical examination without abnormal findings: Secondary | ICD-10-CM | POA: Diagnosis not present

## 2017-02-13 NOTE — Progress Notes (Signed)
Subjective:   Phyllis Ross is a 81 y.o. female who presents for Medicare Annual (Subsequent) preventive examination.  Review of Systems:  N/A Cardiac Risk Factors include: advanced age (>1men, >30 women);dyslipidemia;hypertension     Objective:     Vitals: BP 120/60 (BP Location: Right Arm, Patient Position: Sitting, Cuff Size: Normal)   Pulse 70   Temp 97.7 F (36.5 C) (Oral)   Resp 16   Ht 5\' 6"  (1.676 m)   Wt 132 lb 6.4 oz (60.1 kg)   BMI 21.37 kg/m   Body mass index is 21.37 kg/m.   Tobacco Social History   Tobacco Use  Smoking Status Never Smoker  Smokeless Tobacco Never Used     Counseling given: Not Answered   Past Medical History:  Diagnosis Date  . Anxiety   . Cancer ([place])   . Hyperlipidemia   . Hypertension   . MI (myocardial infarction) ([place])   . Osteoporosis   . Rheumatic fever    Past Surgical History:  Procedure Laterality Date  . COLONOSCOPY  2011   normal- Dr Vira Agar  . CORONARY ARTERY BYPASS GRAFT    . VAGINAL HYSTERECTOMY     DUB   Family History  Problem Relation Age of Onset  . Heart attack Mother   . Heart attack Maternal Uncle   . Heart attack Maternal Uncle    Social History   Substance and Sexual Activity  Sexual Activity Yes    Outpatient Encounter Medications as of 02/13/2017  Medication Sig  . ALPRAZolam (XANAX) 0.25 MG tablet Take 1 tablet (0.25 mg total) by mouth 2 (two) times daily as needed for anxiety.  Marland Kitchen aspirin EC 81 MG tablet Take 1 tablet (81 mg total) by mouth daily.  Marland Kitchen atorvastatin (LIPITOR) 80 MG tablet TAKE ONE TABLET AT BEDTIME.  Marland Kitchen lisinopril (PRINIVIL,ZESTRIL) 10 MG tablet TAKE TWO TABLETS DAILY AS DIRECTED BY PHYSICIAN  . NITROSTAT 0.4 MG SL tablet 1 TABLET UNDER TONGUE EVERY 5 MINUTES AS NEEDED FOR CHEST PAIN. MAY REPEAT UP TO 3 DOSES, IF NO RELIEF GO TO HOSP..  . clotrimazole-betamethasone (LOTRISONE) cream Apply 1 application topically 2 (two) times daily. (Patient not taking: Reported on  02/13/2017)  . etodolac (LODINE) 500 MG tablet Take 1 tablet (500 mg total) by mouth 2 (two) times daily. (Patient not taking: Reported on 07/28/2016)   No facility-administered encounter medications on file as of 02/13/2017.     Activities of Daily Living In your present state of health, do you have any difficulty performing the following activities: 02/13/2017  Hearing? N  Vision? N  Difficulty concentrating or making decisions? N  Walking or climbing stairs? N  Dressing or bathing? N  Doing errands, shopping? N  Preparing Food and eating ? N  Using the Toilet? N  In the past six months, have you accidently leaked urine? N  Do you have problems with loss of bowel control? N  Managing your Medications? N  Managing your Finances? N  Housekeeping or managing your Housekeeping? N  Some recent data might be hidden    Patient Care Team: Juline Patch, MD as PCP - General (Family Medicine) Corey Skains, MD as Consulting Physician (Cardiology)    Assessment:     Exercise Activities and Dietary recommendations Current Exercise Habits: Structured exercise class, Type of exercise: Other - see comments, Time (Minutes): 40, Frequency (Times/Week): 7, Weekly Exercise (Minutes/Week): 280, Intensity: Mild, Exercise limited by: None identified  Goals  None     Fall Risk Fall Risk  02/13/2017 12/22/2016 09/24/2015 05/01/2015 10/13/2014  Falls in the past year? No No No No No   Depression Screen PHQ 2/9 Scores 02/13/2017 12/22/2016 09/24/2015 05/01/2015  PHQ - 2 Score 0 2 0 0  PHQ- 9 Score 0 2 - -     Cognitive Function     6CIT Screen 02/13/2017  What Year? 0 points  What month? 0 points  What time? 0 points  Count back from 20 0 points  Months in reverse 0 points  Repeat phrase 2 points  Total Score 2    Immunization History  Administered Date(s) Administered  . Tdap 06/16/2016   Screening Tests Health Maintenance  Topic Date Due  . PNA vac Low Risk Adult (1 of 2  - PCV13) 02/12/2003  . INFLUENZA VACCINE  11/02/2016  . TETANUS/TDAP  06/17/2026  . DEXA SCAN  Discontinued      Plan:    I have personally reviewed and addressed the Medicare Annual Wellness questionnaire and have noted the following in the patient's chart:  A. Medical and social history B. Use of alcohol, tobacco or illicit drugs  C. Current medications and supplements D. Functional ability and status E.  Nutritional status F.  Physical activity G. Advance directives H. List of other physicians I.  Hospitalizations, surgeries, and ER visits in previous 12 months J.  [place] such as hearing and vision if needed, cognitive and depression L. Referrals and appointments - none  In addition, I have reviewed and discussed with patient certain preventive protocols, quality metrics, and best practice recommendations. A written personalized care plan for preventive services as well as general preventive health recommendations were provided to patient.  See attached scanned questionnaire for additional information.   Signed,  Aleatha Borer, LPN Nurse Health Advisor  Nurse Notes: Due for Pneumococcal vaccines but declined to  Receive PCV 13 today.  Flu vaccine - pt advised to provide a copy of her vaccination record  Shingles vaccine - Due for Shingles vaccine. Declined. Pt advised to call her insurance company to determine her out of pocket expense.  Colon Cancer screenings - Advised pt to provide a copy of her colonoscopy report.  Mammogram - Pt states she no longer wishes to have breast cancer screenings performed due to a bad past experience.

## 2017-02-13 NOTE — Patient Instructions (Addendum)
Phyllis Ross , Thank you for taking time to come for your Medicare Wellness Visit. I appreciate your ongoing commitment to your health goals. Please review the following plan we discussed and let me know if I can assist you in the future.   Screening recommendations/referrals: Colonoscopy: Please provide a copy of your colonoscopy report to determine the frequency of your colon cancer screenings. Mammogram: Declined. Bone Density: Declined Recommended yearly ophthalmology/optometry visit for glaucoma screening and checkup Recommended yearly dental visit for hygiene and checkup  Vaccinations: Influenza vaccine: Please provide a copy of your vaccination record from [facility] Pneumococcal vaccine: Declined Tdap vaccine: Up to date Shingles vaccine: Declined    Advanced directives: Please bring a copy of your POA (Power of Attorney) and/or Living Will to your next appointment.   Conditions/risks identified: Pt states she will reduce portion sizes and eat more vegetables  Next appointment: Please schedule a follow up appt with Dr. Ronnald Ramp.  Please schedule your annual wellness exam in one year.   Preventive Care 79 Years and Older, Female Preventive care refers to lifestyle choices and visits with your health care provider that can promote health and wellness. What does preventive care include?  A yearly physical exam. This is also called an annual well check.  Dental exams once or twice a year.  Routine eye exams. Ask your health care provider how often you should have your eyes checked.  Personal lifestyle choices, including:  Daily care of your teeth and gums.  Regular physical activity.  Eating a healthy diet.  Avoiding tobacco and drug use.  Limiting alcohol use.  Practicing safe sex.  Taking low-dose aspirin every day.  Taking vitamin and mineral supplements as recommended by your health care provider. What happens during an annual well check? The services and  screenings done by your health care provider during your annual well check will depend on your age, overall health, lifestyle risk factors, and family history of disease. Counseling  Your health care provider may ask you questions about your:  Alcohol use.  Tobacco use.  Drug use.  Emotional well-being.  Home and relationship well-being.  Sexual activity.  Eating habits.  History of falls.  Memory and ability to understand (cognition).  Work and work Statistician.  Reproductive health. Screening  You may have the following tests or measurements:  Height, weight, and BMI.  Blood pressure.  Lipid and cholesterol levels. These may be checked every 5 years, or more frequently if you are over 4 years old.  Skin check.  Lung cancer screening. You may have this screening every year starting at age 46 if you have a 30-pack-year history of smoking and currently smoke or have quit within the past 15 years.  Fecal occult blood test (FOBT) of the stool. You may have this test every year starting at age 61.  Flexible sigmoidoscopy or colonoscopy. You may have a sigmoidoscopy every 5 years or a colonoscopy every 10 years starting at age 21.  Hepatitis C blood test.  Hepatitis B blood test.  Sexually transmitted disease (STD) testing.  Diabetes screening. This is done by checking your blood sugar (glucose) after you have not eaten for a while (fasting). You may have this done every 1-3 years.  Bone density scan. This is done to screen for osteoporosis. You may have this done starting at age 68.  Mammogram. This may be done every 1-2 years. Talk to your health care provider about how often you should have regular mammograms. Talk with your  health care provider about your test results, treatment options, and if necessary, the need for more tests. Vaccines  Your health care provider may recommend certain vaccines, such as:  Influenza vaccine. This is recommended every  year.  Tetanus, diphtheria, and acellular pertussis (Tdap, Td) vaccine. You may need a Td booster every 10 years.  Zoster vaccine. You may need this after age 62.  Pneumococcal 13-valent conjugate (PCV13) vaccine. One dose is recommended after age 55.  Pneumococcal polysaccharide (PPSV23) vaccine. One dose is recommended after age 76. Talk to your health care provider about which screenings and vaccines you need and how often you need them. This information is not intended to replace advice given to you by your health care provider. Make sure you discuss any questions you have with your health care provider. Document Released: 04/17/2015 Document Revised: 12/09/2015 Document Reviewed: 01/20/2015 Elsevier Interactive Patient Education  2017 [place] Prevention in the Home Falls can cause injuries. They can happen to people of all ages. There are many things you can do to make your home safe and to help prevent falls. What can I do on the outside of my home?  Regularly fix the edges of walkways and driveways and fix any cracks.  Remove anything that might make you trip as you walk through a door, such as a raised step or threshold.  Trim any bushes or trees on the path to your home.  Use bright outdoor lighting.  Clear any walking paths of anything that might make someone trip, such as rocks or tools.  Regularly check to see if handrails are loose or broken. Make sure that both sides of any steps have handrails.  Any raised decks and porches should have guardrails on the edges.  Have any leaves, snow, or ice cleared regularly.  Use sand or salt on walking paths during winter.  Clean up any spills in your garage right away. This includes oil or grease spills. What can I do in the bathroom?  Use night lights.  Install grab bars by the toilet and in the tub and shower. Do not use towel bars as grab bars.  Use non-skid mats or decals in the tub or shower.  If you  need to sit down in the shower, use a plastic, non-slip stool.  Keep the floor dry. Clean up any water that spills on the floor as soon as it happens.  Remove soap buildup in the tub or shower regularly.  Attach bath mats securely with double-sided non-slip rug tape.  Do not have throw rugs and other things on the floor that can make you trip. What can I do in the bedroom?  Use night lights.  Make sure that you have a light by your bed that is easy to reach.  Do not use any sheets or blankets that are too big for your bed. They should not hang down onto the floor.  Have a firm chair that has side arms. You can use this for support while you get dressed.  Do not have throw rugs and other things on the floor that can make you trip. What can I do in the kitchen?  Clean up any spills right away.  Avoid walking on wet floors.  Keep items that you use a lot in easy-to-reach places.  If you need to reach something above you, use a strong step stool that has a grab bar.  Keep electrical cords out of the way.  Do not use  floor polish or wax that makes floors slippery. If you must use wax, use non-skid floor wax.  Do not have throw rugs and other things on the floor that can make you trip. What can I do with my stairs?  Do not leave any items on the stairs.  Make sure that there are handrails on both sides of the stairs and use them. Fix handrails that are broken or loose. Make sure that handrails are as long as the stairways.  Check any carpeting to make sure that it is firmly attached to the stairs. Fix any carpet that is loose or worn.  Avoid having throw rugs at the top or bottom of the stairs. If you do have throw rugs, attach them to the floor with carpet tape.  Make sure that you have a light switch at the top of the stairs and the bottom of the stairs. If you do not have them, ask someone to add them for you. What else can I do to help prevent falls?  Wear shoes  that:  Do not have high heels.  Have rubber bottoms.  Are comfortable and fit you well.  Are closed at the toe. Do not wear sandals.  If you use a stepladder:  Make sure that it is fully opened. Do not climb a closed stepladder.  Make sure that both sides of the stepladder are locked into place.  Ask someone to hold it for you, if possible.  Clearly mark and make sure that you can see:  Any grab bars or handrails.  First and last steps.  Where the edge of each step is.  Use tools that help you move around (mobility aids) if they are needed. These include:  Canes.  Walkers.  Scooters.  Crutches.  Turn on the lights when you go into a dark area. Replace any light bulbs as soon as they burn out.  Set up your furniture so you have a clear path. Avoid moving your furniture around.  If any of your floors are uneven, fix them.  If there are any pets around you, be aware of where they are.  Review your medicines with your doctor. Some medicines can make you feel dizzy. This can increase your chance of falling. Ask your doctor what other things that you can do to help prevent falls. This information is not intended to replace advice given to you by your health care provider. Make sure you discuss any questions you have with your health care provider. Document Released: 01/15/2009 Document Revised: 08/27/2015 Document Reviewed: 04/25/2014 Elsevier Interactive Patient Education  2017 [employer].

## 2017-02-21 ENCOUNTER — Ambulatory Visit: Payer: Medicare Other | Admitting: Family Medicine

## 2017-02-21 ENCOUNTER — Encounter: Payer: Self-pay | Admitting: Family Medicine

## 2017-02-21 VITALS — BP 120/80 | HR 60 | Ht 66.0 in | Wt 130.0 lb

## 2017-02-21 DIAGNOSIS — J0100 Acute maxillary sinusitis, unspecified: Secondary | ICD-10-CM

## 2017-02-21 DIAGNOSIS — J029 Acute pharyngitis, unspecified: Secondary | ICD-10-CM

## 2017-02-21 MED ORDER — AZITHROMYCIN 250 MG PO TABS: ORAL_TABLET | ORAL | 0 refills | Status: DC

## 2017-02-21 NOTE — Progress Notes (Signed)
Name: Phyllis Ross   MRN: [id number]    DOB: November 03, 1935   Date:02/21/2017       Progress Note  Subjective  Chief Complaint  Chief Complaint  Patient presents with  . Sore Throat    sitting in an air conditioned restaurant yesterday- woke up with sore throat this am    Sore Throat   This is a new problem. The current episode started yesterday. The problem has been gradually worsening. Neither side of throat is experiencing more pain than the other. There has been no fever. Associated symptoms include congestion, coughing and a hoarse voice. Pertinent negatives include no abdominal pain, diarrhea, drooling, ear discharge, ear pain, headaches, plugged ear sensation, neck pain, shortness of breath, swollen glands or trouble swallowing. She has had no exposure to strep or mono. She has tried nothing for the symptoms.  Sinusitis  This is a new problem. The current episode started yesterday. The problem has been gradually worsening since onset. There has been no fever. Associated symptoms include congestion, coughing, a hoarse voice, sinus pressure and a sore throat. Pertinent negatives include no chills, ear pain, headaches, neck pain, shortness of breath or swollen glands.    No problem-specific Assessment & Plan notes found for this encounter.   Past Medical History:  Diagnosis Date  . Anxiety   . Cancer ([place])   . Hyperlipidemia   . Hypertension   . MI (myocardial infarction) ([place])   . Osteoporosis   . Rheumatic fever     Past Surgical History:  Procedure Laterality Date  . COLONOSCOPY  2011   normal- Dr Vira Agar  . CORONARY ARTERY BYPASS GRAFT    . VAGINAL HYSTERECTOMY     DUB    Family History  Problem Relation Age of Onset  . Heart attack Mother   . Heart attack Maternal Uncle   . Heart attack Maternal Uncle     Social History   Socioeconomic History  . Marital status: Married    Spouse name: Not on file  . Number of children: Not on file  . Years of  education: Not on file  . Highest education level: Not on file  Social Needs  . Financial resource strain: Not hard at all  . Food insecurity - worry: Never true  . Food insecurity - inability: Never true  . Transportation needs - medical: No  . Transportation needs - non-medical: No  Occupational History    Employer: RETIRED  Tobacco Use  . Smoking status: Never Smoker  . Smokeless tobacco: Never Used  Substance and Sexual Activity  . Alcohol use: No  . Drug use: No  . Sexual activity: Yes  Other Topics Concern  . Not on file  Social History Narrative  . Not on file    No Known Allergies  Outpatient Medications Prior to Visit  Medication Sig Dispense Refill  . ALPRAZolam (XANAX) 0.25 MG tablet Take 1 tablet (0.25 mg total) by mouth 2 (two) times daily as needed for anxiety. 30 tablet 1  . aspirin EC 81 MG tablet Take 1 tablet (81 mg total) by mouth daily. 30 tablet 11  . atorvastatin (LIPITOR) 80 MG tablet TAKE ONE TABLET AT BEDTIME. 90 tablet 3  . lisinopril (PRINIVIL,ZESTRIL) 10 MG tablet TAKE TWO TABLETS DAILY AS DIRECTED BY PHYSICIAN 180 tablet 3  . NITROSTAT 0.4 MG SL tablet 1 TABLET UNDER TONGUE EVERY 5 MINUTES AS NEEDED FOR CHEST PAIN. MAY REPEAT UP TO 3 DOSES, IF NO RELIEF GO TO  HOSP.. 25 tablet 3  . clotrimazole-betamethasone (LOTRISONE) cream Apply 1 application topically 2 (two) times daily. (Patient not taking: Reported on 02/21/2017) 30 g 0  . etodolac (LODINE) 500 MG tablet Take 1 tablet (500 mg total) by mouth 2 (two) times daily. (Patient not taking: Reported on 07/28/2016) 60 tablet 1   No facility-administered medications prior to visit.     Review of Systems  Constitutional: Negative for chills, fever, malaise/fatigue and weight loss.  HENT: Positive for congestion, hoarse voice, sinus pressure and sore throat. Negative for drooling, ear discharge, ear pain and trouble swallowing.   Eyes: Negative for blurred vision.  Respiratory: Positive for cough.  Negative for sputum production, shortness of breath and wheezing.   Cardiovascular: Negative for chest pain, palpitations and leg swelling.  Gastrointestinal: Negative for abdominal pain, blood in stool, constipation, diarrhea, heartburn, melena and nausea.  Genitourinary: Negative for dysuria, frequency, hematuria and urgency.  Musculoskeletal: Negative for back pain, joint pain, myalgias and neck pain.  Skin: Negative for rash.  Neurological: Negative for dizziness, tingling, sensory change, focal weakness and headaches.  Endo/Heme/Allergies: Negative for environmental allergies and polydipsia. Does not bruise/bleed easily.  Psychiatric/Behavioral: Negative for depression and suicidal ideas. The patient is not nervous/anxious and does not have insomnia.      Objective  Vitals:   02/21/17 0931  BP: 120/80  Pulse: 60  Weight: 130 lb (59 kg)  Height: 5\' 6"  (1.676 m)    Physical Exam  Constitutional: She is well-developed, well-nourished, and in no distress. No distress.  HENT:  Head: Normocephalic and atraumatic.  Right Ear: External ear normal.  Left Ear: External ear normal.  Nose: Nose normal.  Mouth/Throat: Posterior oropharyngeal erythema present. No oropharyngeal exudate or posterior oropharyngeal edema.  Eyes: Conjunctivae and EOM are normal. Pupils are equal, round, and reactive to light. Right eye exhibits no discharge. Left eye exhibits no discharge.  Neck: Normal range of motion. Neck supple. No JVD present. No thyromegaly present.  Cardiovascular: Normal rate, regular rhythm, normal heart sounds and intact distal pulses. Exam reveals no gallop and no friction rub.  No murmur heard. Pulmonary/Chest: Effort normal and breath sounds normal. She has no wheezes. She has no rales.  Abdominal: Soft. Bowel sounds are normal. She exhibits no mass. There is no tenderness. There is no guarding.  Musculoskeletal: Normal range of motion. She exhibits no edema.  Lymphadenopathy:     She has no cervical adenopathy.  Neurological: She is alert. She has normal reflexes.  Skin: Skin is warm and dry. She is not diaphoretic.  Psychiatric: Mood and affect normal.  Nursing note and vitals reviewed.     Assessment & Plan  Problem List Items Addressed This Visit    None    Visit Diagnoses    Acute maxillary sinusitis, recurrence not specified    -  Primary   Relevant Medications   azithromycin (ZITHROMAX) 250 MG tablet   Pharyngitis, unspecified etiology          Meds ordered this encounter  Medications  . azithromycin (ZITHROMAX) 250 MG tablet    Sig: 2 today then 1 a day for 4 days    Dispense:  6 tablet    Refill:  0      Dr. [facility]  02/21/17

## 2017-03-24 ENCOUNTER — Other Ambulatory Visit: Payer: Self-pay | Admitting: Family Medicine

## 2017-03-24 DIAGNOSIS — I10 Essential (primary) hypertension: Secondary | ICD-10-CM

## 2017-03-24 DIAGNOSIS — E7849 Other hyperlipidemia: Secondary | ICD-10-CM

## 2017-05-15 ENCOUNTER — Inpatient Hospital Stay: Payer: Medicare Other | Attending: Obstetrics and Gynecology

## 2017-05-15 ENCOUNTER — Other Ambulatory Visit: Payer: Medicare Other

## 2017-05-15 DIAGNOSIS — Z9221 Personal history of antineoplastic chemotherapy: Secondary | ICD-10-CM | POA: Insufficient documentation

## 2017-05-15 DIAGNOSIS — Z8543 Personal history of malignant neoplasm of ovary: Secondary | ICD-10-CM

## 2017-05-16 LAB — CA 125: CANCER ANTIGEN (CA) 125: 25 U/mL (ref 0.0–38.1)

## 2017-05-24 ENCOUNTER — Inpatient Hospital Stay: Payer: Medicare Other

## 2017-05-30 NOTE — Progress Notes (Signed)
Gynecologic Oncology Visit    Chief Concern: Ovarian cancer surveillance  Subjective:  Phyllis Ross is a 82 y.o. female who has a history of stage IIIb ovarian adenocarcinoma s/p surgical cytoreduction in 10/2003 followed by paclitaxel and carboplatin chemotherapy completed 04/2004.    Last CA125 05/2016 negative exam and CA125 was 25 on 05/15/2017. No concerning symptoms today.   Gynecologic Oncology History Phyllis Ross is a pleasant female who has a history of stage IIIb ovarian adenocarcinoma s/p surgical cytoreduction in 10/2003 followed by paclitaxel and carboplatin chemotherapy completed 04/2004. Serial CA125 negative.  Clinically NED.   Nov 2015 BI-RADS CATEGORY  1: Negative. Per her report she also had a mammogram last year that was negative. Ordered by Dr. Ronnald Ramp. She did not have a mammogram since b/c of discomfort with the exam.   Problem List: Patient Active Problem List   Diagnosis Date Noted  . Age-related osteoporosis without current pathological fracture 06/16/2016  . History of ovarian cancer 05/20/2015  . 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  . Wedging of vertebra ([place]) 10/13/2014  . Polypharmacy 10/13/2014  . Encounter for general adult medical examination without abnormal findings 10/13/2014  . Anxiety 10/13/2014  . Essential hypertension 08/27/2014  . Bradycardia 08/27/2014  . Coronary artery disease of native heart with stable angina pectoris ([place]) 08/27/2014  . MI (mitral incompetence) 08/27/2014  . TI (tricuspid incompetence) 08/27/2014  . Combined fat and carbohydrate induced hyperlipemia 08/13/2014  . HTN (hypertension), malignant 08/11/2014  . Chest pain 08/11/2014  . CAD (coronary artery disease) of artery bypass graft 08/11/2014    Past Medical History: Past Medical History:  Diagnosis Date  . Anxiety   . Cancer ([place])   . Hyperlipidemia   . Hypertension   . MI (myocardial infarction) ([place])   . Osteoporosis   .  Rheumatic fever     Past Surgical History: Past Surgical History:  Procedure Laterality Date  . COLONOSCOPY  2011   normal- Dr Vira Agar  . CORONARY ARTERY BYPASS GRAFT    . VAGINAL HYSTERECTOMY     DUB    Past Gynecologic History:  As per Oncology history  OB History:  OB History  No data available    Family History: Family History  Problem Relation Age of Onset  . Heart attack Mother   . Heart attack Maternal Uncle   . Heart attack Maternal Uncle     Social History: Social History   Socioeconomic History  . Marital status: Married    Spouse name: Not on file  . Number of children: Not on file  . Years of education: Not on file  . Highest education level: Not on file  Social Needs  . Financial resource strain: Not hard at all  . Food insecurity - worry: Never true  . Food insecurity - inability: Never true  . Transportation needs - medical: No  . Transportation needs - non-medical: No  Occupational History    Employer: RETIRED  Tobacco Use  . Smoking status: Never Smoker  . Smokeless tobacco: Never Used  Substance and Sexual Activity  . Alcohol use: No  . Drug use: No  . Sexual activity: Yes  Other Topics Concern  . Not on file  Social History Narrative  . Not on file    Allergies: No Known Allergies  Current Medications: Current Outpatient Medications  Medication Sig Dispense Refill  . aspirin EC 81 MG tablet Take 1 tablet (81 mg total) by mouth daily. 30 tablet 11  .  atorvastatin (LIPITOR) 80 MG tablet 1 TABLET BY MOUTH AT BEDTIME. 90 tablet 0  . lisinopril (PRINIVIL,ZESTRIL) 10 MG tablet 2TPODAILY AS DIRECTED BY PHYSICIAN 180 tablet 0  . ALPRAZolam (XANAX) 0.25 MG tablet Take 1 tablet (0.25 mg total) by mouth 2 (two) times daily as needed for anxiety. (Patient not taking: Reported on 05/31/2017) 30 tablet 1  . azithromycin (ZITHROMAX) 250 MG tablet 2 today then 1 a day for 4 days (Patient not taking: Reported on 05/31/2017) 6 tablet 0  .  clotrimazole-betamethasone (LOTRISONE) cream Apply 1 application topically 2 (two) times daily. (Patient not taking: Reported on 02/21/2017) 30 g 0  . etodolac (LODINE) 500 MG tablet Take 1 tablet (500 mg total) by mouth 2 (two) times daily. (Patient not taking: Reported on 07/28/2016) 60 tablet 1  . NITROSTAT 0.4 MG SL tablet 1 TABLET UNDER TONGUE EVERY 5 MINUTES AS NEEDED FOR CHEST PAIN. MAY REPEAT UP TO 3 DOSES, IF NO RELIEF GO TO HOSP.. (Patient not taking: Reported on 05/31/2017) 25 tablet 3   No current facility-administered medications for this visit.     Review of Systems General: no complaints  HEENT: no complaints  Lungs: no complaints  Cardiac: no complaints  GI: no complaints  GU: no complaints  Musculoskeletal: no complaints  Extremities: no complaints  Skin: basal cell carcinomas removed recently by dermatology  Neuro: no complaints  Endocrine: no complaints  Psych: no complaints      Objective:  Physical Examination:  BP (!) 149/78 (Patient Position: Sitting)   Pulse (!) 57   Temp 97.8 F (36.6 C) (Tympanic)   Resp 18   Ht 5\' 6"  (1.676 m)   Wt 132 lb 3.2 oz (60 kg)   SpO2 99%   BMI 21.34 kg/m    ECOG Performance Status: 0 - Asymptomatic  General appearance: alert, cooperative and appears stated age HEENT:PERRLA, extra ocular movement intact and sclera clear, anicteric Lymph node survey: non-palpable, axillary, inguinal, supraclavicular Cardiovascular: regular rate and rhythm Respiratory: normal air entry, lungs clear to auscultation Abdomen: soft, non-tender, without masses or organomegaly, no hernias and well healed incision Back: inspection of back is normal Extremities: extremities normal, atraumatic, no cyanosis or edema Skin exam - numerous actinic skin lesions present; dressings over recent excisions Neurological exam reveals alert, oriented, normal speech, no focal findings or movement disorder noted.  Pelvic: exam chaperoned by nurse practitioner;   Vulva: normal appearing vulva with no masses, tenderness or lesions; Vagina: normal vagina; Adnexa: no masses, surgically absent bilateral; Uterus: surgically absent, vaginal cuff well healed; Cervix: absent; Rectal: normal rectal, no masses  Lab Review  Lab Results  Component Value Date   CA125 20.5 05/25/2016    Radiologic Imaging: None    Assessment:  Phyllis Ross is a 82 y.o. female diagnosed with history of stage IIIB  ovarian cancer, clinically NED. Normal CA125. Plan:   Problem List Items Addressed This Visit      Other   History of ovarian cancer - Primary   Relevant Orders   CA 125     I recommended return to clinic in  1 year for continued surveillance and repeat CA125.      Verlon Au, NP  I personally reviewed the patient's history, completed key elements of her exam, and was involved in decision making in conjunction with Ms. Allen.   Gillis Ends, MD  CC:  Juline Patch, MD [address] [place], [place] [address] [phone]

## 2017-05-31 ENCOUNTER — Encounter: Payer: Self-pay | Admitting: Obstetrics and Gynecology

## 2017-05-31 ENCOUNTER — Inpatient Hospital Stay (HOSPITAL_BASED_OUTPATIENT_CLINIC_OR_DEPARTMENT_OTHER): Payer: Medicare Other | Admitting: Obstetrics and Gynecology

## 2017-05-31 VITALS — BP 149/78 | HR 57 | Temp 97.8°F | Resp 18 | Ht 66.0 in | Wt 132.2 lb

## 2017-05-31 DIAGNOSIS — Z8543 Personal history of malignant neoplasm of ovary: Secondary | ICD-10-CM

## 2017-05-31 DIAGNOSIS — Z9221 Personal history of antineoplastic chemotherapy: Secondary | ICD-10-CM | POA: Diagnosis not present

## 2017-05-31 NOTE — Progress Notes (Signed)
No new Gyn changes noted today 

## 2017-07-12 ENCOUNTER — Other Ambulatory Visit: Payer: Self-pay | Admitting: Family Medicine

## 2017-07-12 DIAGNOSIS — E7849 Other hyperlipidemia: Secondary | ICD-10-CM

## 2017-10-03 ENCOUNTER — Other Ambulatory Visit: Payer: Self-pay | Admitting: Family Medicine

## 2017-10-03 DIAGNOSIS — I10 Essential (primary) hypertension: Secondary | ICD-10-CM

## 2017-12-02 ENCOUNTER — Other Ambulatory Visit: Payer: Self-pay | Admitting: Family Medicine

## 2017-12-02 DIAGNOSIS — I10 Essential (primary) hypertension: Secondary | ICD-10-CM

## 2017-12-08 ENCOUNTER — Encounter: Payer: Self-pay | Admitting: Family Medicine

## 2017-12-08 ENCOUNTER — Ambulatory Visit (INDEPENDENT_AMBULATORY_CARE_PROVIDER_SITE_OTHER): Payer: Medicare Other | Admitting: Family Medicine

## 2017-12-08 VITALS — BP 130/70 | HR 76 | Ht 66.0 in | Wt 128.0 lb

## 2017-12-08 DIAGNOSIS — E7849 Other hyperlipidemia: Secondary | ICD-10-CM | POA: Diagnosis not present

## 2017-12-08 DIAGNOSIS — F4321 Adjustment disorder with depressed mood: Secondary | ICD-10-CM | POA: Diagnosis not present

## 2017-12-08 DIAGNOSIS — F419 Anxiety disorder, unspecified: Secondary | ICD-10-CM

## 2017-12-08 DIAGNOSIS — J0100 Acute maxillary sinusitis, unspecified: Secondary | ICD-10-CM

## 2017-12-08 DIAGNOSIS — I10 Essential (primary) hypertension: Secondary | ICD-10-CM

## 2017-12-08 MED ORDER — ALPRAZOLAM 0.25 MG PO TABS: 0.2500 mg | ORAL_TABLET | Freq: Two times a day (BID) | ORAL | 1 refills | Status: DC | PRN

## 2017-12-08 MED ORDER — LISINOPRIL 10 MG PO TABS: ORAL_TABLET | ORAL | 1 refills | Status: DC

## 2017-12-08 MED ORDER — ATORVASTATIN CALCIUM 80 MG PO TABS: ORAL_TABLET | ORAL | 1 refills | Status: DC

## 2017-12-08 MED ORDER — AZITHROMYCIN 250 MG PO TABS: ORAL_TABLET | ORAL | 0 refills | Status: DC

## 2017-12-08 NOTE — Progress Notes (Signed)
Name: Phyllis Ross   MRN: [id number]    DOB: 1935-07-07   Date:12/08/2017       Progress Note  Subjective  Chief Complaint  Chief Complaint  Patient presents with  . Hyperlipidemia  . Hypertension  . Anxiety  . Sore Throat    hurts to swallow on R) side of throat    Hyperlipidemia  This is a chronic problem. The current episode started more than 1 year ago. The problem is controlled. Recent lipid tests were reviewed and are normal. She has no history of chronic renal disease, diabetes, hypothyroidism, liver disease, obesity or nephrotic syndrome. There are no known factors aggravating her hyperlipidemia. Pertinent negatives include no chest pain, focal sensory loss, focal weakness, leg pain, myalgias or shortness of breath. Current antihyperlipidemic treatment includes statins. The current treatment provides moderate improvement of lipids. There are no compliance problems.   Hypertension  This is a chronic problem. The current episode started more than 1 year ago. The problem is unchanged. The problem is controlled. Associated symptoms include anxiety. Pertinent negatives include no blurred vision, chest pain, headaches, malaise/fatigue, neck pain, orthopnea, palpitations or shortness of breath. There are no associated agents to hypertension. Past treatments include ACE inhibitors. The current treatment provides moderate improvement. There are no compliance problems.  There is no history of angina, kidney disease, CAD/MI, CVA, heart failure, left ventricular hypertrophy, PVD or retinopathy. There is no history of chronic renal disease.  Anxiety  Presents for follow-up visit. Symptoms include depressed mood, excessive worry and nervous/anxious behavior. Patient reports no chest pain, dizziness, insomnia, nausea, palpitations, shortness of breath or suicidal ideas. Symptoms occur occasionally. The severity of symptoms is moderate.    Sore Throat   This is a new problem. The current episode  started in the past 7 days. The problem has been waxing and waning. The pain is moderate. Pertinent negatives include no abdominal pain, coughing, diarrhea, ear discharge, ear pain, headaches, neck pain or shortness of breath.    Essential hypertension Stable on med-  Continue lisinopril and draw renal panel  Familial multiple lipoprotein-type hyperlipidemia Stable on med- continue atorvastatin and draw lipids   Past Medical History:  Diagnosis Date  . Anxiety   . Cancer ([place])   . Hyperlipidemia   . Hypertension   . MI (myocardial infarction) ([place])   . Osteoporosis   . Rheumatic fever     Past Surgical History:  Procedure Laterality Date  . COLONOSCOPY  2011   normal- Dr Vira Agar  . CORONARY ARTERY BYPASS GRAFT    . VAGINAL HYSTERECTOMY     DUB    Family History  Problem Relation Age of Onset  . Heart attack Mother   . Heart attack Maternal Uncle   . Heart attack Maternal Uncle     Social History   Socioeconomic History  . Marital status: Married    Spouse name: Not on file  . Number of children: Not on file  . Years of education: Not on file  . Highest education level: Not on file  Occupational History    Employer: RETIRED  Social Needs  . Financial resource strain: Not hard at all  . Food insecurity:    Worry: Never true    Inability: Never true  . Transportation needs:    Medical: No    Non-medical: No  Tobacco Use  . Smoking status: Never Smoker  . Smokeless tobacco: Never Used  Substance and Sexual Activity  . Alcohol use: No  .  Drug use: No  . Sexual activity: Yes  Lifestyle  . Physical activity:    Days per week: 6 days    Minutes per session: 40 min  . Stress: Not at all  Relationships  . Social connections:    Talks on phone: More than three times a week    Gets together: More than three times a week    Attends religious service: 1 to 4 times per year    Active member of club or organization: Yes    Attends meetings of clubs or  organizations: More than 4 times per year    Relationship status: Married  . Intimate partner violence:    Fear of current or ex partner: No    Emotionally abused: No    Physically abused: No    Forced sexual activity: No  Other Topics Concern  . Not on file  Social History Narrative  . Not on file    No Known Allergies  Outpatient Medications Prior to Visit  Medication Sig Dispense Refill  . aspirin EC 81 MG tablet Take 1 tablet (81 mg total) by mouth daily. 30 tablet 11  . atorvastatin (LIPITOR) 80 MG tablet 1 TABLET BY MOUTH AT BEDTIME. 90 tablet 0  . lisinopril (PRINIVIL,ZESTRIL) 10 MG tablet TAKE (2) TABLETS BY MOUTH EVERY DAY 60 tablet 0  . NITROSTAT 0.4 MG SL tablet 1 TABLET UNDER TONGUE EVERY 5 MINUTES AS NEEDED FOR CHEST PAIN. MAY REPEAT UP TO 3 DOSES, IF NO RELIEF GO TO HOSP.. (Patient not taking: Reported on 05/31/2017) 25 tablet 3  . ALPRAZolam (XANAX) 0.25 MG tablet Take 1 tablet (0.25 mg total) by mouth 2 (two) times daily as needed for anxiety. (Patient not taking: Reported on 05/31/2017) 30 tablet 1  . azithromycin (ZITHROMAX) 250 MG tablet 2 today then 1 a day for 4 days (Patient not taking: Reported on 05/31/2017) 6 tablet 0  . clotrimazole-betamethasone (LOTRISONE) cream Apply 1 application topically 2 (two) times daily. (Patient not taking: Reported on 02/21/2017) 30 g 0  . etodolac (LODINE) 500 MG tablet Take 1 tablet (500 mg total) by mouth 2 (two) times daily. (Patient not taking: Reported on 07/28/2016) 60 tablet 1   No facility-administered medications prior to visit.     Review of Systems  Constitutional: Negative for chills, fever, malaise/fatigue and weight loss.  HENT: Negative for ear discharge, ear pain and sore throat.   Eyes: Negative for blurred vision.  Respiratory: Negative for cough, sputum production, shortness of breath and wheezing.   Cardiovascular: Negative for chest pain, palpitations, orthopnea and leg swelling.  Gastrointestinal: Negative  for abdominal pain, blood in stool, constipation, diarrhea, heartburn, melena and nausea.  Genitourinary: Negative for dysuria, frequency, hematuria and urgency.  Musculoskeletal: Negative for back pain, joint pain, myalgias and neck pain.  Skin: Negative for rash.  Neurological: Negative for dizziness, tingling, sensory change, focal weakness and headaches.  Endo/Heme/Allergies: Negative for environmental allergies and polydipsia. Does not bruise/bleed easily.  Psychiatric/Behavioral: Negative for depression and suicidal ideas. The patient is nervous/anxious. The patient does not have insomnia.      Objective  Vitals:   12/08/17 0911  BP: 130/70  Pulse: 76  Weight: 128 lb (58.1 kg)  Height: 5\' 6"  (1.676 m)    Physical Exam  Constitutional: No distress.  HENT:  Head: Normocephalic and atraumatic.  Right Ear: Hearing, tympanic membrane, external ear and ear canal normal.  Left Ear: Hearing, tympanic membrane, external ear and ear canal normal.  Nose:  Nose normal.  Mouth/Throat: Posterior oropharyngeal erythema present. No oropharyngeal exudate or posterior oropharyngeal edema.  Eyes: Pupils are equal, round, and reactive to light. Conjunctivae and EOM are normal. Right eye exhibits no discharge. Left eye exhibits no discharge.  Neck: Normal range of motion. Neck supple. No JVD present. No thyromegaly present.  Cardiovascular: Normal rate, regular rhythm, normal heart sounds and intact distal pulses. Exam reveals no gallop and no friction rub.  No murmur heard. Pulmonary/Chest: Effort normal and breath sounds normal.  Abdominal: Soft. Bowel sounds are normal. She exhibits no mass. There is no tenderness. There is no guarding.  Musculoskeletal: Normal range of motion. She exhibits no edema.  Lymphadenopathy:    She has cervical adenopathy.  Neurological: She is alert. She has normal reflexes.  Skin: Skin is warm and dry. She is not diaphoretic.      Assessment &  Plan  Problem List Items Addressed This Visit      Cardiovascular and Mediastinum   Essential hypertension - Primary    Stable on med-  Continue lisinopril and draw renal panel      Relevant Medications   lisinopril (PRINIVIL,ZESTRIL) 10 MG tablet   atorvastatin (LIPITOR) 80 MG tablet   Other Relevant Orders   Renal Function Panel     Other   Anxiety   Relevant Medications   ALPRAZolam (XANAX) 0.25 MG tablet   Familial multiple lipoprotein-type hyperlipidemia    Stable on med- continue atorvastatin and draw lipids      Relevant Medications   lisinopril (PRINIVIL,ZESTRIL) 10 MG tablet   atorvastatin (LIPITOR) 80 MG tablet   Other Relevant Orders   Lipid panel    Other Visit Diagnoses    Grief reaction       refill alprazolam   Relevant Medications   ALPRAZolam (XANAX) 0.25 MG tablet   Acute maxillary sinusitis, recurrence not specified       start ZPack use as directed   Relevant Medications   azithromycin (ZITHROMAX) 250 MG tablet      Meds ordered this encounter  Medications  . lisinopril (PRINIVIL,ZESTRIL) 10 MG tablet    Sig: One bid    Dispense:  180 tablet    Refill:  1    sched MED REFILL appt  . atorvastatin (LIPITOR) 80 MG tablet    Sig: One a day    Dispense:  90 tablet    Refill:  1  . ALPRAZolam (XANAX) 0.25 MG tablet    Sig: Take 1 tablet (0.25 mg total) by mouth 2 (two) times daily as needed for anxiety.    Dispense:  30 tablet    Refill:  1  . azithromycin (ZITHROMAX) 250 MG tablet    Sig: 2 today then 1 a day for 4 days    Dispense:  6 tablet    Refill:  0      Dr. [facility]  12/08/17

## 2017-12-08 NOTE — Assessment & Plan Note (Signed)
Stable on med-  Continue lisinopril and draw renal panel

## 2017-12-08 NOTE — Assessment & Plan Note (Signed)
Stable on med- continue atorvastatin and draw lipids

## 2017-12-13 LAB — LIPID PANEL
Chol/HDL Ratio: 2.9 ratio (ref 0.0–4.4)
Cholesterol, Total: 148 mg/dL (ref 100–199)
HDL: 51 mg/dL (ref >39)
LDL Calculated: 78 mg/dL (ref 0–99)
Triglycerides: 95 mg/dL (ref 0–149)
VLDL CHOLESTEROL CAL: 19 mg/dL (ref 5–40)

## 2017-12-13 LAB — RENAL FUNCTION PANEL
ALBUMIN: 3.9 g/dL (ref 3.5–4.8)
BUN/Creatinine Ratio: 16 (ref 12–28)
BUN: 15 mg/dL (ref 8–27)
CHLORIDE: 101 mmol/L (ref 96–106)
CO2: 25 mmol/L (ref 20–29)
Calcium: 9.2 mg/dL (ref 8.7–10.3)
Creatinine, Ser: 0.92 mg/dL (ref 0.57–1.00)
GFR, EST AFRICAN AMERICAN: 68 mL/min/{1.73_m2} (ref >59)
GFR, EST NON AFRICAN AMERICAN: 59 mL/min/{1.73_m2} — AB (ref >59)
GLUCOSE: 82 mg/dL (ref 65–99)
PHOSPHORUS: 3.8 mg/dL (ref 2.5–4.5)
POTASSIUM: 4.9 mmol/L (ref 3.5–5.2)
Sodium: 142 mmol/L (ref 134–144)

## 2017-12-26 DIAGNOSIS — I700 Atherosclerosis of aorta: Secondary | ICD-10-CM | POA: Insufficient documentation

## 2018-01-11 ENCOUNTER — Ambulatory Visit (INDEPENDENT_AMBULATORY_CARE_PROVIDER_SITE_OTHER): Payer: Medicare Other | Admitting: Family Medicine

## 2018-01-11 ENCOUNTER — Ambulatory Visit
Admission: RE | Admit: 2018-01-11 | Discharge: 2018-01-11 | Disposition: A | Discharge status: Home / Self Care | Payer: Medicare Other | Source: Ambulatory Visit | Attending: Family Medicine | Admitting: Family Medicine

## 2018-01-11 ENCOUNTER — Encounter: Payer: Self-pay | Admitting: Family Medicine

## 2018-01-11 VITALS — BP 120/68 | HR 60 | Ht 66.0 in | Wt 129.0 lb

## 2018-01-11 DIAGNOSIS — X58XXXA Exposure to other specified factors, initial encounter: Secondary | ICD-10-CM | POA: Diagnosis not present

## 2018-01-11 DIAGNOSIS — S6721XA Crushing injury of right hand, initial encounter: Secondary | ICD-10-CM | POA: Insufficient documentation

## 2018-01-11 MED ORDER — TRAMADOL HCL 50 MG PO TABS: 50.0000 mg | ORAL_TABLET | Freq: Three times a day (TID) | ORAL | 0 refills | Status: DC | PRN

## 2018-01-11 NOTE — Progress Notes (Signed)
Date:  01/11/2018   Name:  Phyllis Ross   DOB:  Oct 08, 1935   MRN:  [id number]   Chief Complaint: Hand Pain (lady fell, pinning her hand between her and the armrest inside the car. Was able to play ball afterwards, but bruising and pain has not gotten better. Has a competition next week) Hand Pain   The incident occurred 5 to 7 days ago (Sunday). [place] park. Injury mechanism: hand trapped by pitiful player. The pain is present in the right hand. The quality of the pain is described as aching. The pain does not radiate. The pain is moderate. Pertinent negatives include no chest pain, muscle weakness, numbness or tingling. The treatment provided no relief.     Review of Systems  Constitutional: Negative.  Negative for chills, fatigue, fever and unexpected weight change.  HENT: Negative for congestion, ear discharge, ear pain, rhinorrhea, sinus pressure, sneezing and sore throat.   Eyes: Negative for photophobia, pain, discharge, redness and itching.  Respiratory: Negative for cough, shortness of breath, wheezing and stridor.   Cardiovascular: Negative for chest pain.  Gastrointestinal: Negative for abdominal pain, blood in stool, constipation, diarrhea, nausea and vomiting.  Endocrine: Negative for cold intolerance, heat intolerance, polydipsia, polyphagia and polyuria.  Genitourinary: Negative for dysuria, flank pain, frequency, hematuria, menstrual problem, pelvic pain, urgency, vaginal bleeding and vaginal discharge.  Musculoskeletal: Negative for arthralgias, back pain and myalgias.  Skin: Negative for rash.  Allergic/Immunologic: Negative for environmental allergies and food allergies.  Neurological: Negative for dizziness, tingling, weakness, light-headedness, numbness and headaches.  Hematological: Negative for adenopathy. Does not bruise/bleed easily.  Psychiatric/Behavioral: Negative for dysphoric mood. The patient is not nervous/anxious.     Patient  Active Problem List   Diagnosis Date Noted  . Age-related osteoporosis without current pathological fracture 06/16/2016  . History of ovarian cancer 05/20/2015  . 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  . Wedging of vertebra ([place]) 10/13/2014  . Polypharmacy 10/13/2014  . Encounter for general adult medical examination without abnormal findings 10/13/2014  . Anxiety 10/13/2014  . Essential hypertension 08/27/2014  . Bradycardia 08/27/2014  . Coronary artery disease of native heart with stable angina pectoris ([place]) 08/27/2014  . MI (mitral incompetence) 08/27/2014  . TI (tricuspid incompetence) 08/27/2014  . Combined fat and carbohydrate induced hyperlipemia 08/13/2014  . HTN (hypertension), malignant 08/11/2014  . Chest pain 08/11/2014  . CAD (coronary artery disease) of artery bypass graft 08/11/2014    No Known Allergies  Past Surgical History:  Procedure Laterality Date  . COLONOSCOPY  2011   normal- Dr Vira Agar  . CORONARY ARTERY BYPASS GRAFT    . VAGINAL HYSTERECTOMY     DUB    Social History   Tobacco Use  . Smoking status: Never Smoker  . Smokeless tobacco: Never Used  Substance Use Topics  . Alcohol use: No  . Drug use: No     Medication list has been reviewed and updated.  Current Meds  Medication Sig  . ALPRAZolam (XANAX) 0.25 MG tablet Take 1 tablet (0.25 mg total) by mouth 2 (two) times daily as needed for anxiety.  Marland Kitchen aspirin EC 81 MG tablet Take 1 tablet (81 mg total) by mouth daily.  Marland Kitchen atorvastatin (LIPITOR) 80 MG tablet One a day  . lisinopril (PRINIVIL,ZESTRIL) 10 MG tablet One bid  . NITROSTAT 0.4 MG SL tablet 1 TABLET UNDER TONGUE EVERY 5 MINUTES AS NEEDED FOR CHEST PAIN. MAY REPEAT UP TO 3 DOSES, IF NO  RELIEF GO TO HOSP.Marland Kitchen    PHQ 2/9 Scores 12/08/2017 12/08/2017 02/13/2017 12/22/2016  PHQ - 2 Score 0 0 0 2  PHQ- 9 Score 0 - 0 2    Physical Exam  Constitutional: She is oriented to person, place, and time. She appears  well-developed and well-nourished.  HENT:  Head: Normocephalic.  Right Ear: External ear normal.  Left Ear: External ear normal.  Mouth/Throat: Oropharynx is clear and moist.  Eyes: Pupils are equal, round, and reactive to light. Conjunctivae and EOM are normal. Lids are everted and swept, no foreign bodies found. Left eye exhibits no hordeolum. No foreign body present in the left eye. Right conjunctiva is not injected. Left conjunctiva is not injected. No scleral icterus.  Neck: Normal range of motion. Neck supple. No JVD present. No tracheal deviation present. No thyromegaly present.  Cardiovascular: Normal rate, regular rhythm, normal heart sounds and intact distal pulses. Exam reveals no gallop and no friction rub.  No murmur heard. Pulmonary/Chest: Effort normal and breath sounds normal. No respiratory distress. She has no wheezes. She has no rales.  Abdominal: Soft. Bowel sounds are normal. She exhibits no mass. There is no hepatosplenomegaly. There is no tenderness. There is no rebound and no guarding.  Musculoskeletal: Normal range of motion. She exhibits no edema or tenderness.  Lymphadenopathy:    She has no cervical adenopathy.  Neurological: She is alert and oriented to person, place, and time. She has normal strength. She displays normal reflexes. No cranial nerve deficit.  Skin: Skin is warm. No rash noted.  Psychiatric: She has a normal mood and affect. Her mood appears not anxious. She does not exhibit a depressed mood.  Nursing note and vitals reviewed.   BP 120/68   Pulse 60   Ht 5\' 6"  (1.676 m)   Wt 129 lb (58.5 kg)   BMI 20.82 kg/m   Assessment and Plan:  1. Crush injury of hand, right, initial encounter Wounded pitcher falls on right hand Team has to settle for silver. Will xray hand and prescribe tramadol 50 mg tid prn. No PICKLEBALL. - DG Hand Complete Right; Future - traMADol (ULTRAM) 50 MG tablet; Take 1 tablet (50 mg total) by mouth every 8 (eight) hours as  needed.  Dispense: 15 tablet; Refill: 0   Dr. Macon [facility]  01/11/2018

## 2018-01-11 NOTE — Patient Instructions (Signed)
Contusion A contusion is a deep bruise. Contusions are the result of a blunt injury to tissues and muscle fibers under the skin. The injury causes bleeding under the skin. The skin overlying the contusion may turn blue, purple, or yellow. Minor injuries will give you a painless contusion, but more severe contusions may stay painful and swollen for a few weeks. What are the causes? This condition is usually caused by a blow, trauma, or direct force to an area of the body. What are the signs or symptoms? Symptoms of this condition include:  Swelling of the injured area.  Pain and tenderness in the injured area.  Discoloration. The area may have redness and then turn blue, purple, or yellow.  How is this diagnosed? This condition is diagnosed based on a physical exam and medical history. An X-ray, CT scan, or MRI may be needed to determine if there are any associated injuries, such as broken bones (fractures). How is this treated? Specific treatment for this condition depends on what area of the body was injured. In general, the best treatment for a contusion is resting, icing, applying pressure to (compression), and elevating the injured area. This is often called the RICE strategy. Over-the-counter anti-inflammatory medicines may also be recommended for pain control. Follow these instructions at home:  Rest the injured area.  If directed, apply ice to the injured area: ? Put ice in a plastic bag. ? Place a towel between your skin and the bag. ? Leave the ice on for 20 minutes, 2-3 times per day.  If directed, apply light compression to the injured area using an elastic bandage. Make sure the bandage is not wrapped too tightly. Remove and reapply the bandage as directed by your health care provider.  If possible, raise (elevate) the injured area above the level of your heart while you are sitting or lying down.  Take over-the-counter and prescription medicines only as told by your health  care provider. Contact a health care provider if:  Your symptoms do not improve after several days of treatment.  Your symptoms get worse.  You have difficulty moving the injured area. Get help right away if:  You have severe pain.  You have numbness in a hand or foot.  Your hand or foot turns pale or cold. This information is not intended to replace advice given to you by your health care provider. Make sure you discuss any questions you have with your health care provider. Document Released: 12/29/2004 Document Revised: 07/30/2015 Document Reviewed: 08/06/2014 Elsevier Interactive Patient Education  2018 [employer]  

## 2018-01-19 ENCOUNTER — Other Ambulatory Visit: Payer: Self-pay | Admitting: Family Medicine

## 2018-02-14 ENCOUNTER — Ambulatory Visit: Payer: Self-pay

## 2018-03-12 ENCOUNTER — Telehealth: Payer: Self-pay | Admitting: Family Medicine

## 2018-03-12 NOTE — Telephone Encounter (Signed)
Tried to call patient back to schedule AWV-S.  No answer.  L/M for patient to call Phyllis Ross at [phone].  Need to schedule AWV-S.  Last awv 02/13/17.lec

## 2018-03-12 NOTE — Telephone Encounter (Signed)
Called patient at home phone #.  Husband states she is not there at this time. He request a call back to speak with patient because he may not remember message.

## 2018-03-13 NOTE — Telephone Encounter (Signed)
Scheduled AWV-S for 05/14/18 at 3:20 PM. lec

## 2018-05-03 ENCOUNTER — Encounter: Payer: Self-pay | Admitting: Family Medicine

## 2018-05-03 ENCOUNTER — Ambulatory Visit (INDEPENDENT_AMBULATORY_CARE_PROVIDER_SITE_OTHER): Payer: Medicare Other | Admitting: Family Medicine

## 2018-05-03 ENCOUNTER — Other Ambulatory Visit: Payer: Self-pay | Admitting: Family Medicine

## 2018-05-03 VITALS — BP 120/70 | HR 60 | Temp 97.5°F | Ht 66.0 in | Wt 135.0 lb

## 2018-05-03 DIAGNOSIS — F419 Anxiety disorder, unspecified: Secondary | ICD-10-CM

## 2018-05-03 DIAGNOSIS — J0100 Acute maxillary sinusitis, unspecified: Secondary | ICD-10-CM | POA: Diagnosis not present

## 2018-05-03 DIAGNOSIS — F4321 Adjustment disorder with depressed mood: Secondary | ICD-10-CM

## 2018-05-03 MED ORDER — AMOXICILLIN 500 MG PO CAPS: 500.0000 mg | ORAL_CAPSULE | Freq: Three times a day (TID) | ORAL | 0 refills | Status: DC

## 2018-05-03 NOTE — Progress Notes (Signed)
Date:  05/03/2018   Name:  Phyllis Ross   DOB:  31-Mar-1936   MRN:  [id number]   Chief Complaint: Allergic Rhinitis  (clear drainage from nose)  Sinusitis  This is a new problem. The current episode started yesterday. The problem has been gradually worsening since onset. There has been no fever. The fever has been present for 1 to 2 days. The pain is mild. Pertinent negatives include no chills, congestion, coughing, diaphoresis, ear pain, headaches, hoarse voice, neck pain, shortness of breath, sinus pressure, sneezing, sore throat or swollen glands. (Prod nasal discharge) Past treatments include nothing. The treatment provided mild relief.    Review of Systems  Constitutional: Negative.  Negative for chills, diaphoresis, fatigue, fever and unexpected weight change.  HENT: Negative for congestion, ear discharge, ear pain, hoarse voice, rhinorrhea, sinus pressure, sneezing and sore throat.   Eyes: Negative for photophobia, pain, discharge, redness and itching.  Respiratory: Negative for cough, shortness of breath, wheezing and stridor.   Gastrointestinal: Negative for abdominal pain, blood in stool, constipation, diarrhea, nausea and vomiting.  Endocrine: Negative for cold intolerance, heat intolerance, polydipsia, polyphagia and polyuria.  Genitourinary: Negative for dysuria, flank pain, frequency, hematuria, menstrual problem, pelvic pain, urgency, vaginal bleeding and vaginal discharge.  Musculoskeletal: Negative for arthralgias, back pain, myalgias and neck pain.  Skin: Negative for rash.  Allergic/Immunologic: Negative for environmental allergies and food allergies.  Neurological: Negative for dizziness, weakness, light-headedness, numbness and headaches.  Hematological: Negative for adenopathy. Does not bruise/bleed easily.  Psychiatric/Behavioral: Negative for dysphoric mood. The patient is not nervous/anxious.     Patient Active Problem List   Diagnosis Date Noted  .  Age-related osteoporosis without current pathological fracture 06/16/2016  . History of ovarian cancer 05/20/2015  . 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  . Wedging of vertebra ([place]) 10/13/2014  . Polypharmacy 10/13/2014  . Encounter for general adult medical examination without abnormal findings 10/13/2014  . Anxiety 10/13/2014  . Essential hypertension 08/27/2014  . Bradycardia 08/27/2014  . Coronary artery disease of native heart with stable angina pectoris ([place]) 08/27/2014  . MI (mitral incompetence) 08/27/2014  . TI (tricuspid incompetence) 08/27/2014  . Combined fat and carbohydrate induced hyperlipemia 08/13/2014  . HTN (hypertension), malignant 08/11/2014  . Chest pain 08/11/2014  . CAD (coronary artery disease) of artery bypass graft 08/11/2014    No Known Allergies  Past Surgical History:  Procedure Laterality Date  . COLONOSCOPY  2011   normal- Dr Vira Agar  . CORONARY ARTERY BYPASS GRAFT    . VAGINAL HYSTERECTOMY     DUB    Social History   Tobacco Use  . Smoking status: Never Smoker  . Smokeless tobacco: Never Used  Substance Use Topics  . Alcohol use: No  . Drug use: No     Medication list has been reviewed and updated.  Current Meds  Medication Sig  . ALPRAZolam (XANAX) 0.25 MG tablet Take 1 tablet (0.25 mg total) by mouth 2 (two) times daily as needed for anxiety.  Marland Kitchen aspirin EC 81 MG tablet Take 1 tablet (81 mg total) by mouth daily.  Marland Kitchen atorvastatin (LIPITOR) 80 MG tablet One a day  . lisinopril (PRINIVIL,ZESTRIL) 10 MG tablet One bid  . NITROSTAT 0.4 MG SL tablet 1 TABLET UNDER TONGUE EVERY 5 MINUTES AS NEEDED FOR CHEST PAIN. MAY REPEAT UP TO 3 DOSES, IF NO RELIEF GO TO HOSP..  . [DISCONTINUED] traMADol (ULTRAM) 50 MG tablet Take 1 tablet (50 mg total) by mouth  every 8 (eight) hours as needed.    PHQ 2/9 Scores 12/08/2017 12/08/2017 02/13/2017 12/22/2016  PHQ - 2 Score 0 0 0 2  PHQ- 9 Score 0 - 0 2    Physical Exam Vitals  signs and nursing note reviewed.  Constitutional:      General: She is not in acute distress.    Appearance: She is not diaphoretic.  HENT:     Head: Normocephalic and atraumatic.     Right Ear: Tympanic membrane, ear canal and external ear normal.     Left Ear: Tympanic membrane, ear canal and external ear normal.     Nose: Nose normal.  Eyes:     General:        Right eye: No discharge.        Left eye: No discharge.     Conjunctiva/sclera: Conjunctivae normal.     Pupils: Pupils are equal, round, and reactive to light.  Neck:     Musculoskeletal: Normal range of motion and neck supple.     Thyroid: No thyromegaly.     Vascular: No JVD.  Cardiovascular:     Rate and Rhythm: Normal rate and regular rhythm.     Heart sounds: Normal heart sounds. No murmur. No friction rub. No gallop.   Pulmonary:     Effort: Pulmonary effort is normal.     Breath sounds: Normal breath sounds. No wheezing or rhonchi.  Abdominal:     General: Bowel sounds are normal.     Palpations: Abdomen is soft. There is no mass.     Tenderness: There is no abdominal tenderness. There is no guarding.  Musculoskeletal: Normal range of motion.  Lymphadenopathy:     Cervical: No cervical adenopathy.  Skin:    General: Skin is warm and dry.  Neurological:     Mental Status: She is alert.     Deep Tendon Reflexes: Reflexes are normal and symmetric.     BP 120/70   Pulse 60   Temp (!) 97.5 F (36.4 C) (Oral)   Ht 5\' 6"  (1.676 m)   Wt 135 lb (61.2 kg)   BMI 21.79 kg/m   Assessment and Plan: 1. Acute non-recurrent maxillary sinusitis Acute.  Gradually worsening.  Patient was initiated on amoxicillin after exam was noted to have tenderness of both maxillary sinuses. - amoxicillin (AMOXIL) 500 MG capsule; Take 1 capsule (500 mg total) by mouth 3 (three) times daily.  Dispense: 30 capsule; Refill: 0

## 2018-05-14 ENCOUNTER — Ambulatory Visit (INDEPENDENT_AMBULATORY_CARE_PROVIDER_SITE_OTHER): Payer: Medicare Other

## 2018-05-14 VITALS — BP 136/84 | HR 64 | Temp 98.3°F | Resp 16 | Ht 66.0 in | Wt 127.6 lb

## 2018-05-14 DIAGNOSIS — Z780 Asymptomatic menopausal state: Secondary | ICD-10-CM

## 2018-05-14 DIAGNOSIS — Z0000 Encounter for general adult medical examination without abnormal findings: Secondary | ICD-10-CM

## 2018-05-14 NOTE — Progress Notes (Signed)
Subjective:   Phyllis Ross is a 83 y.o. female who presents for Medicare Annual (Subsequent) preventive examination.  Review of Systems:   Cardiac Risk Factors include: advanced age (>65men, >70 women);hypertension;dyslipidemia     Objective:     Vitals: BP 136/84 (BP Location: Left Arm, Patient Position: Sitting, Cuff Size: Normal)   Pulse 64   Temp 98.3 F (36.8 C) (Oral)   Resp 16   Ht 5\' 6"  (1.676 m)   Wt 127 lb 9.6 oz (57.9 kg)   SpO2 98%   BMI 20.60 kg/m   Body mass index is 20.6 kg/m.  Advanced Directives 05/14/2018 05/31/2017 02/13/2017 05/18/2016 05/20/2015 10/13/2014 08/11/2014  Does Patient Have a Medical Advance Directive? Yes Yes Yes Yes Yes Yes Yes  Type of Paramedic of [place];Living will [place];Living will [place];Living will [place];Living will [place];Living will Living will Living will  Does patient want to make changes to medical advance directive? - No - Patient declined - - - - No - Patient declined  Copy of [place] in Chart? Yes - validated most recent copy scanned in chart (See row information) Yes No - copy requested Yes No - copy requested No - copy requested No - copy requested    Tobacco Social History   Tobacco Use  Smoking Status Never Smoker  Smokeless Tobacco Never Used     Counseling given: Not Answered   Clinical Intake:  Pre-visit preparation completed: Yes  Pain : No/denies pain     BMI - recorded: 20.6 Nutritional Status: BMI of 19-24  Normal Nutritional Risks: Nausea/ vomitting/ diarrhea(diarrhea yesterday) Diabetes: No  How often do you need to have someone help you when you read instructions, pamphlets, or other written materials from your doctor or pharmacy?: 1 - Never What is the last grade level you completed in school?: some college  Interpreter Needed?: No  Information entered by ::  Clemetine Marker LPN  Past Medical History:  Diagnosis Date  . Anxiety   . Cancer ([place])   . Hyperlipidemia   . Hypertension   . MI (myocardial infarction) ([place])   . Osteoporosis   . Rheumatic fever    Past Surgical History:  Procedure Laterality Date  . COLONOSCOPY  2011   normal- Dr Vira Agar  . CORONARY ARTERY BYPASS GRAFT    . VAGINAL HYSTERECTOMY     DUB   Family History  Problem Relation Age of Onset  . Heart attack Mother   . Heart attack Maternal Uncle   . Heart attack Maternal Uncle    Social History   Socioeconomic History  . Marital status: Married    Spouse name: Not on file  . Number of children: 2  . Years of education: Not on file  . Highest education level: Some college, no degree  Occupational History    Employer: RETIRED  Social Needs  . Financial resource strain: Not hard at all  . Food insecurity:    Worry: Never true    Inability: Never true  . Transportation needs:    Medical: No    Non-medical: No  Tobacco Use  . Smoking status: Never Smoker  . Smokeless tobacco: Never Used  Substance and Sexual Activity  . Alcohol use: No  . Drug use: No  . Sexual activity: Yes  Lifestyle  . Physical activity:    Days per week: 6 days    Minutes per  session: 40 min  . Stress: To some extent  Relationships  . Social connections:    Talks on phone: More than three times a week    Gets together: More than three times a week    Attends religious service: More than 4 times per year    Active member of club or organization: Yes    Attends meetings of clubs or organizations: More than 4 times per year    Relationship status: Married  Other Topics Concern  . Not on file  Social History Narrative  . Not on file    Outpatient Encounter Medications as of 05/14/2018  Medication Sig  . ALPRAZolam (XANAX) 0.25 MG tablet Take 1 tablet (0.25 mg total) by mouth 2 (two) times daily as needed for anxiety.  Marland Kitchen aspirin EC 81 MG tablet Take 1 tablet (81 mg total) by  mouth daily.  Marland Kitchen atorvastatin (LIPITOR) 80 MG tablet One a day  . lisinopril (PRINIVIL,ZESTRIL) 10 MG tablet One bid  . NITROSTAT 0.4 MG SL tablet 1 TABLET UNDER TONGUE EVERY 5 MINUTES AS NEEDED FOR CHEST PAIN. MAY REPEAT UP TO 3 DOSES, IF NO RELIEF GO TO HOSP..  . [DISCONTINUED] amoxicillin (AMOXIL) 500 MG capsule Take 1 capsule (500 mg total) by mouth 3 (three) times daily.   No facility-administered encounter medications on file as of 05/14/2018.     Activities of Daily Living In your present state of health, do you have any difficulty performing the following activities: 05/14/2018  Hearing? N  Comment declines hearing aids  Vision? N  Comment reading glasses  Difficulty concentrating or making decisions? N  Walking or climbing stairs? N  Dressing or bathing? N  Doing errands, shopping? N  Preparing Food and eating ? N  Using the Toilet? N  In the past six months, have you accidently leaked urine? N  Do you have problems with loss of bowel control? N  Managing your Medications? N  Managing your Finances? N  Housekeeping or managing your Housekeeping? N  Some recent data might be hidden    Patient Care Team: Juline Patch, MD as PCP - General (Family Medicine) Corey Skains, MD as Consulting Physician (Cardiology)    Assessment:   This is a routine wellness examination for [place].  Exercise Activities and Dietary recommendations Current Exercise Habits: Structured exercise class;Home exercise routine, Type of exercise: calisthenics;walking;Other - see comments(pickle ball, table tennis), Time (Minutes): 40, Frequency (Times/Week): 6, Weekly Exercise (Minutes/Week): 240, Intensity: Moderate, Exercise limited by: None identified  Goals    . Reduce portion size (pt-stated)     Pt states she will reduce portion sizes and eat more vegetables       Fall Risk Fall Risk  05/14/2018 02/13/2017 12/22/2016 09/24/2015 05/01/2015  Falls in the past year? 0 No No No No  Number  falls in past yr: 0 - - - -  Injury with Fall? 0 - - - -  Follow up Falls prevention discussed - - - -   FALL RISK PREVENTION PERTAINING TO THE HOME:  Any stairs in or around the home? No  If so, are they are without handrails? No   Home free of loose throw rugs in walkways, pet beds, electrical cords, etc? Yes  Adequate lighting in your home to reduce risk of falls? Yes   ASSISTIVE DEVICES UTILIZED TO PREVENT FALLS:  Life alert? No  Use of a cane, walker or w/c? No  Grab bars in the bathroom? Yes  Shower  chair or bench in shower? No  Elevated toilet seat or a handicapped toilet? No   DME ORDERS:  DME order needed?  No   TIMED UP AND GO:  Was the test performed? Yes .  Length of time to ambulate 10 feet: 6 sec.   GAIT:  Appearance of gait: Gait stead-fast and without the use of an assistive device.  Education: Fall risk prevention has been discussed.  Intervention(s) required? No    Depression Screen PHQ 2/9 Scores 05/14/2018 12/08/2017 12/08/2017 02/13/2017  PHQ - 2 Score 0 0 0 0  PHQ- 9 Score - 0 - 0     Cognitive Function     6CIT Screen 05/14/2018 02/13/2017  What Year? 0 points 0 points  What month? 0 points 0 points  What time? 0 points 0 points  Count back from 20 0 points 0 points  Months in reverse 0 points 0 points  Repeat phrase 0 points 2 points  Total Score 0 2    Immunization History  Administered Date(s) Administered  . Tdap 06/16/2016    Qualifies for Shingles Vaccine? Yes . Due for Shingrix. Education has been provided regarding the importance of this vaccine. Pt has been advised to call insurance company to determine out of pocket expense. Advised may also receive vaccine at local pharmacy or Health Dept. Verbalized acceptance and understanding.  Tdap: Up to date  Flu Vaccine: Up to date. Done at [facility] patient. Will call to verify.  Pneumococcal Vaccine: Due for Pneumococcal vaccine. Does the patient want to receive this  vaccine today?  No . Education has been provided regarding the importance of this vaccine but still declined. Advised may receive this vaccine at local pharmacy or Health Dept. Aware to provide a copy of the vaccination record if obtained from local pharmacy or Health Dept. Verbalized acceptance and understanding.   Screening Tests Health Maintenance  Topic Date Due  . INFLUENZA VACCINE  07/28/2018 (Originally 11/02/2017)  . PNA vac Low Risk Adult (1 of 2 - PCV13) 12/09/2018 (Originally 02/12/2003)  . TETANUS/TDAP  06/17/2026  . DEXA SCAN  Discontinued    Cancer Screenings:  Colorectal Screening: No longer required.   Mammogram: No longer required.    Bone Density: Completed 2004. Results unavailable. Repeat every 2 years. Ordered today. Pt provided with contact information and advised to call to schedule appt.   Lung Cancer Screening: (Low Dose CT Chest recommended if Age 27-80 years, 30 pack-year currently smoking OR have quit w/in 15years.) does not qualify.     Additional Screening:  Hepatitis C Screening: no longer required  Vision Screening: Recommended annual ophthalmology exams for early detection of glaucoma and other disorders of the eye. Is the patient up to date with their annual eye exam?  Yes  Who is the provider or what is the name of the office in which the pt attends annual eye exams? Dr. Michelene Heady  Dental Screening: Recommended annual dental exams for proper oral hygiene  Community Resource Referral:  CRR required this visit?  No       Plan:      I have personally reviewed and addressed the Medicare Annual Wellness questionnaire and have noted the following in the patient's chart:  A. Medical and social history B. Use of alcohol, tobacco or illicit drugs  C. Current medications and supplements D. Functional ability and status E.  Nutritional status F.  Physical activity G. Advance directives H. List of other physicians I.  Hospitalizations,  surgeries, and ER visits in previous 12 months J.  [place] such as hearing and vision if needed, cognitive and depression L. Referrals and appointments   In addition, I have reviewed and discussed with patient certain preventive protocols, quality metrics, and best practice recommendations. A written personalized care plan for preventive services as well as general preventive health recommendations were provided to patient.   Signed,  Clemetine Marker, LPN Nurse Health Advisor   Nurse Notes: pt doing well and appreciative of visit today. She has been under stress due to taking care of her 65 year old mother in law and her husband but overall feeling good and continuing to be active and spend time with family.

## 2018-05-14 NOTE — Patient Instructions (Signed)
Ms. Phyllis Ross , Thank you for taking time to come for your Medicare Wellness Visit. I appreciate your ongoing commitment to your health goals. Please review the following plan we discussed and let me know if I can assist you in the future.   Screening recommendations/referrals: Colonoscopy: no longer required Mammogram: no longer required Bone Density: Please call [phone] to schedule your bone density exam.  Recommended yearly ophthalmology/optometry visit for glaucoma screening and checkup Recommended yearly dental visit for hygiene and checkup  Vaccinations: Influenza vaccine: done at [facility] Pneumococcal vaccine: postponed Tdap vaccine: done 06/16/16 Shingles vaccine: Shingrix discussed. Please contact your pharmacy for coverage information.     Conditions/risks identified: Keep up the great work!  Next appointment: Please follow up in one year for your Medicare Annual Wellness visit.     Preventive Care 81 Years and Older, Female Preventive care refers to lifestyle choices and visits with your health care provider that can promote health and wellness. What does preventive care include?  A yearly physical exam. This is also called an annual well check.  Dental exams once or twice a year.  Routine eye exams. Ask your health care provider how often you should have your eyes checked.  Personal lifestyle choices, including:  Daily care of your teeth and gums.  Regular physical activity.  Eating a healthy diet.  Avoiding tobacco and drug use.  Limiting alcohol use.  Practicing safe sex.  Taking low-dose aspirin every day.  Taking vitamin and mineral supplements as recommended by your health care provider. What happens during an annual well check? The services and screenings done by your health care provider during your annual well check will depend on your age, overall health, lifestyle risk factors, and family history of disease. Counseling  Your health  care provider may ask you questions about your:  Alcohol use.  Tobacco use.  Drug use.  Emotional well-being.  Home and relationship well-being.  Sexual activity.  Eating habits.  History of falls.  Memory and ability to understand (cognition).  Work and work Statistician.  Reproductive health. Screening  You may have the following tests or measurements:  Height, weight, and BMI.  Blood pressure.  Lipid and cholesterol levels. These may be checked every 5 years, or more frequently if you are over 16 years old.  Skin check.  Lung cancer screening. You may have this screening every year starting at age 79 if you have a 30-pack-year history of smoking and currently smoke or have quit within the past 15 years.  Fecal occult blood test (FOBT) of the stool. You may have this test every year starting at age 48.  Flexible sigmoidoscopy or colonoscopy. You may have a sigmoidoscopy every 5 years or a colonoscopy every 10 years starting at age 28.  Hepatitis C blood test.  Hepatitis B blood test.  Sexually transmitted disease (STD) testing.  Diabetes screening. This is done by checking your blood sugar (glucose) after you have not eaten for a while (fasting). You may have this done every 1-3 years.  Bone density scan. This is done to screen for osteoporosis. You may have this done starting at age 27.  Mammogram. This may be done every 1-2 years. Talk to your health care provider about how often you should have regular mammograms. Talk with your health care provider about your test results, treatment options, and if necessary, the need for more tests. Vaccines  Your health care provider may recommend certain vaccines, such as:  Influenza vaccine.  This is recommended every year.  Tetanus, diphtheria, and acellular pertussis (Tdap, Td) vaccine. You may need a Td booster every 10 years.  Zoster vaccine. You may need this after age 33.  Pneumococcal 13-valent conjugate  (PCV13) vaccine. One dose is recommended after age 35.  Pneumococcal polysaccharide (PPSV23) vaccine. One dose is recommended after age 33. Talk to your health care provider about which screenings and vaccines you need and how often you need them. This information is not intended to replace advice given to you by your health care provider. Make sure you discuss any questions you have with your health care provider. Document Released: 04/17/2015 Document Revised: 12/09/2015 Document Reviewed: 01/20/2015 Elsevier Interactive Patient Education  2017 [employer] Prevention in the Home Falls can cause injuries. They can happen to people of all ages. There are many things you can do to make your home safe and to help prevent falls. What can I do on the outside of my home?  Regularly fix the edges of walkways and driveways and fix any cracks.  Remove anything that might make you trip as you walk through a door, such as a raised step or threshold.  Trim any bushes or trees on the path to your home.  Use bright outdoor lighting.  Clear any walking paths of anything that might make someone trip, such as rocks or tools.  Regularly check to see if handrails are loose or broken. Make sure that both sides of any steps have handrails.  Any raised decks and porches should have guardrails on the edges.  Have any leaves, snow, or ice cleared regularly.  Use sand or salt on walking paths during winter.  Clean up any spills in your garage right away. This includes oil or grease spills. What can I do in the bathroom?  Use night lights.  Install grab bars by the toilet and in the tub and shower. Do not use towel bars as grab bars.  Use non-skid mats or decals in the tub or shower.  If you need to sit down in the shower, use a plastic, non-slip stool.  Keep the floor dry. Clean up any water that spills on the floor as soon as it happens.  Remove soap buildup in the tub or shower  regularly.  Attach bath mats securely with double-sided non-slip rug tape.  Do not have throw rugs and other things on the floor that can make you trip. What can I do in the bedroom?  Use night lights.  Make sure that you have a light by your bed that is easy to reach.  Do not use any sheets or blankets that are too big for your bed. They should not hang down onto the floor.  Have a firm chair that has side arms. You can use this for support while you get dressed.  Do not have throw rugs and other things on the floor that can make you trip. What can I do in the kitchen?  Clean up any spills right away.  Avoid walking on wet floors.  Keep items that you use a lot in easy-to-reach places.  If you need to reach something above you, use a strong step stool that has a grab bar.  Keep electrical cords out of the way.  Do not use floor polish or wax that makes floors slippery. If you must use wax, use non-skid floor wax.  Do not have throw rugs and other things on the floor that can make  you trip. What can I do with my stairs?  Do not leave any items on the stairs.  Make sure that there are handrails on both sides of the stairs and use them. Fix handrails that are broken or loose. Make sure that handrails are as long as the stairways.  Check any carpeting to make sure that it is firmly attached to the stairs. Fix any carpet that is loose or worn.  Avoid having throw rugs at the top or bottom of the stairs. If you do have throw rugs, attach them to the floor with carpet tape.  Make sure that you have a light switch at the top of the stairs and the bottom of the stairs. If you do not have them, ask someone to add them for you. What else can I do to help prevent falls?  Wear shoes that:  Do not have high heels.  Have rubber bottoms.  Are comfortable and fit you well.  Are closed at the toe. Do not wear sandals.  If you use a stepladder:  Make sure that it is fully  opened. Do not climb a closed stepladder.  Make sure that both sides of the stepladder are locked into place.  Ask someone to hold it for you, if possible.  Clearly mark and make sure that you can see:  Any grab bars or handrails.  First and last steps.  Where the edge of each step is.  Use tools that help you move around (mobility aids) if they are needed. These include:  Canes.  Walkers.  Scooters.  Crutches.  Turn on the lights when you go into a dark area. Replace any light bulbs as soon as they burn out.  Set up your furniture so you have a clear path. Avoid moving your furniture around.  If any of your floors are uneven, fix them.  If there are any pets around you, be aware of where they are.  Review your medicines with your doctor. Some medicines can make you feel dizzy. This can increase your chance of falling. Ask your doctor what other things that you can do to help prevent falls. This information is not intended to replace advice given to you by your health care provider. Make sure you discuss any questions you have with your health care provider. Document Released: 01/15/2009 Document Revised: 08/27/2015 Document Reviewed: 04/25/2014 Elsevier Interactive Patient Education  2017 [employer].

## 2018-05-16 ENCOUNTER — Inpatient Hospital Stay: Payer: Medicare Other | Attending: Obstetrics and Gynecology

## 2018-05-28 ENCOUNTER — Encounter: Payer: Self-pay | Admitting: Family Medicine

## 2018-05-28 ENCOUNTER — Ambulatory Visit (INDEPENDENT_AMBULATORY_CARE_PROVIDER_SITE_OTHER): Payer: Medicare Other | Admitting: Family Medicine

## 2018-05-28 VITALS — BP 112/70 | HR 72 | Ht 66.0 in | Wt 131.0 lb

## 2018-05-28 DIAGNOSIS — B3789 Other sites of candidiasis: Secondary | ICD-10-CM | POA: Diagnosis not present

## 2018-05-28 MED ORDER — NYSTATIN-TRIAMCINOLONE 100000-0.1 UNIT/GM-% EX OINT: 1.0000 "application " | TOPICAL_OINTMENT | Freq: Two times a day (BID) | CUTANEOUS | 1 refills | Status: DC

## 2018-05-28 NOTE — Progress Notes (Signed)
Date:  05/28/2018   Name:  Phyllis Ross   DOB:  1935-12-24   MRN:  [id number]   Chief Complaint: skin itching (R) side itching- started yesterday)  Anxiety  Presents for follow-up visit. Patient reports no chest pain, compulsions, confusion, decreased concentration, depressed mood, dizziness, dry mouth, excessive worry, feeling of choking, hyperventilation, impotence, insomnia, irritability, malaise, muscle tension, nausea, nervous/anxious behavior, obsessions, palpitations, panic, restlessness, shortness of breath or suicidal ideas.    Rash  This is a new problem. The current episode started yesterday. The problem has been waxing and waning since onset. The affected locations include the torso and chest (right infra-breast). She was exposed to nothing. Pertinent negatives include no congestion, cough, diarrhea, eye pain, fatigue, fever, rhinorrhea, shortness of breath, sore throat or vomiting.    Review of Systems  Constitutional: Negative.  Negative for chills, fatigue, fever, irritability and unexpected weight change.  HENT: Negative for congestion, ear discharge, ear pain, rhinorrhea, sinus pressure, sneezing and sore throat.   Eyes: Negative for photophobia, pain, discharge, redness and itching.  Respiratory: Negative for cough, shortness of breath, wheezing and stridor.   Cardiovascular: Negative for chest pain and palpitations.  Gastrointestinal: Negative for abdominal pain, blood in stool, constipation, diarrhea, nausea and vomiting.  Endocrine: Negative for cold intolerance, heat intolerance, polydipsia, polyphagia and polyuria.  Genitourinary: Negative for dysuria, flank pain, frequency, hematuria, impotence, menstrual problem, pelvic pain, urgency, vaginal bleeding and vaginal discharge.  Musculoskeletal: Negative for arthralgias, back pain and myalgias.  Skin: Positive for rash.  Allergic/Immunologic: Negative for environmental allergies and food allergies.   Neurological: Negative for dizziness, weakness, light-headedness, numbness and headaches.  Hematological: Negative for adenopathy. Does not bruise/bleed easily.  Psychiatric/Behavioral: Negative for confusion, decreased concentration, dysphoric mood and suicidal ideas. The patient is not nervous/anxious and does not have insomnia.     Patient Active Problem List   Diagnosis Date Noted  . Atherosclerosis of abdominal aorta ([place]) 12/26/2017  . Age-related osteoporosis without current pathological fracture 06/16/2016  . History of ovarian cancer 05/20/2015  . 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  . Wedging of vertebra ([place]) 10/13/2014  . Polypharmacy 10/13/2014  . Encounter for general adult medical examination without abnormal findings 10/13/2014  . Anxiety 10/13/2014  . Essential hypertension 08/27/2014  . Bradycardia 08/27/2014  . Coronary artery disease of native heart with stable angina pectoris ([place]) 08/27/2014  . MI (mitral incompetence) 08/27/2014  . TI (tricuspid incompetence) 08/27/2014  . Combined fat and carbohydrate induced hyperlipemia 08/13/2014  . HTN (hypertension), malignant 08/11/2014  . Chest pain 08/11/2014  . CAD (coronary artery disease) of artery bypass graft 08/11/2014    No Known Allergies  Past Surgical History:  Procedure Laterality Date  . COLONOSCOPY  2011   normal- Dr Vira Agar  . CORONARY ARTERY BYPASS GRAFT    . VAGINAL HYSTERECTOMY     DUB    Social History   Tobacco Use  . Smoking status: Never Smoker  . Smokeless tobacco: Never Used  Substance Use Topics  . Alcohol use: No  . Drug use: No     Medication list has been reviewed and updated.  Current Meds  Medication Sig  . ALPRAZolam (XANAX) 0.25 MG tablet Take 1 tablet (0.25 mg total) by mouth 2 (two) times daily as needed for anxiety.  Marland Kitchen aspirin EC 81 MG tablet Take 1 tablet (81 mg total) by mouth daily.  Marland Kitchen atorvastatin (LIPITOR) 80 MG tablet One a day  .  lisinopril (PRINIVIL,ZESTRIL) 10  MG tablet One bid  . NITROSTAT 0.4 MG SL tablet 1 TABLET UNDER TONGUE EVERY 5 MINUTES AS NEEDED FOR CHEST PAIN. MAY REPEAT UP TO 3 DOSES, IF NO RELIEF GO TO HOSP.Marland Kitchen    PHQ 2/9 Scores 05/14/2018 12/08/2017 12/08/2017 02/13/2017  PHQ - 2 Score 0 0 0 0  PHQ- 9 Score - 0 - 0    Physical Exam Vitals signs and nursing note reviewed.  Constitutional:      General: She is not in acute distress.    Appearance: She is not diaphoretic.  HENT:     Head: Normocephalic and atraumatic.     Right Ear: External ear normal.     Left Ear: External ear normal.     Nose: Nose normal.  Eyes:     General:        Right eye: No discharge.        Left eye: No discharge.     Conjunctiva/sclera: Conjunctivae normal.     Pupils: Pupils are equal, round, and reactive to light.  Neck:     Musculoskeletal: Normal range of motion and neck supple.     Thyroid: No thyromegaly.     Vascular: No JVD.  Cardiovascular:     Rate and Rhythm: Normal rate and regular rhythm.     Heart sounds: Normal heart sounds. No murmur. No friction rub. No gallop.   Pulmonary:     Effort: Pulmonary effort is normal.     Breath sounds: Normal breath sounds.  Abdominal:     General: Bowel sounds are normal.     Palpations: Abdomen is soft. There is no mass.     Tenderness: There is no abdominal tenderness. There is no guarding.  Musculoskeletal: Normal range of motion.  Lymphadenopathy:     Cervical: No cervical adenopathy.  Skin:    General: Skin is warm and dry.     Findings: Erythema present.  Neurological:     Mental Status: She is alert.     Deep Tendon Reflexes: Reflexes are normal and symmetric.     BP 112/70   Pulse 72   Ht 5\' 6"  (1.676 m)   Wt 131 lb (59.4 kg)   BMI 21.14 kg/m   Assessment and Plan:  1. Candidiasis of breast Patient has erythematous rash beneath each breast without pain with some irritation.  This is consistent with a candidiasis infection and will treat with  nystatin triamcinolone ointment apply twice daily. - nystatin-triamcinolone ointment (MYCOLOG); Apply 1 application topically 2 (two) times daily.  Dispense: 30 g; Refill: 1

## 2018-05-30 ENCOUNTER — Inpatient Hospital Stay: Payer: Medicare Other

## 2018-05-30 NOTE — Progress Notes (Deleted)
Gynecologic Oncology Interval Visit     Chief Concern: Ovarian Cancer Surveillance  Subjective:  Phyllis Ross is a 83 y.o. female who has a history of stage IIIb ovarian adenocarcinoma s/p surgical cytoreduction in 10/2003 followed by paclitaxel and carboplatin chemotherapy completed 04/2004.    Last CA 125 on 05/15/2017 was normal at 25.0. NED on exam.      Gynecologic Oncology History Phyllis Ross is a pleasant female who has a history of stage IIIb ovarian adenocarcinoma s/p surgical cytoreduction in 10/2003 followed by paclitaxel and carboplatin chemotherapy completed 04/2004. Serial CA125 negative.  Clinically NED.   Nov 2015 BI-RADS CATEGORY  1: Negative. Per her report she also had a mammogram last year that was negative. Ordered by Dr. Ronnald Ramp. She did not have a mammogram since b/c of discomfort with the exam.   05/2016 negative exam.   CA 125 26.0 12/22/2009 <0.7 03/23/2010 27.7 09/08/2010 25.2 03/22/2011 24.6 09/23/2011 24.8 03/20/2012 28.5 06/22/2012 22.8 05/20/2015 20.5 05/25/2016 25.0 05/15/2017  Genetic Testing:    Problem List: Patient Active Problem List   Diagnosis Date Noted  . Atherosclerosis of abdominal aorta ([place]) 12/26/2017  . Age-related osteoporosis without current pathological fracture 06/16/2016  . History of ovarian cancer 05/20/2015  . 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  . Wedging of vertebra ([place]) 10/13/2014  . Polypharmacy 10/13/2014  . Encounter for general adult medical examination without abnormal findings 10/13/2014  . Anxiety 10/13/2014  . Essential hypertension 08/27/2014  . Bradycardia 08/27/2014  . Coronary artery disease of native heart with stable angina pectoris ([place]) 08/27/2014  . MI (mitral incompetence) 08/27/2014  . TI (tricuspid incompetence) 08/27/2014  . Combined fat and carbohydrate induced hyperlipemia 08/13/2014  . HTN (hypertension), malignant 08/11/2014  . Chest pain 08/11/2014  . CAD  (coronary artery disease) of artery bypass graft 08/11/2014    Past Medical History: Past Medical History:  Diagnosis Date  . Anxiety   . Cancer ([place])   . Hyperlipidemia   . Hypertension   . MI (myocardial infarction) ([place])   . Osteoporosis   . Rheumatic fever     Past Surgical History: Past Surgical History:  Procedure Laterality Date  . COLONOSCOPY  2011   normal- Dr Vira Agar  . CORONARY ARTERY BYPASS GRAFT    . VAGINAL HYSTERECTOMY     DUB    Past Gynecologic History:  As per Oncology history  OB History:  OB History  No obstetric history on file.    Family History: Family History  Problem Relation Age of Onset  . Heart attack Mother   . Heart attack Maternal Uncle   . Heart attack Maternal Uncle     Social History: Social History   Socioeconomic History  . Marital status: Married    Spouse name: Not on file  . Number of children: 2  . Years of education: Not on file  . Highest education level: Some college, no degree  Occupational History    Employer: RETIRED  Social Needs  . Financial resource strain: Not hard at all  . Food insecurity:    Worry: Never true    Inability: Never true  . Transportation needs:    Medical: No    Non-medical: No  Tobacco Use  . Smoking status: Never Smoker  . Smokeless tobacco: Never Used  Substance and Sexual Activity  . Alcohol use: No  . Drug use: No  . Sexual activity: Yes  Lifestyle  . Physical activity:    Days per week: 6  days    Minutes per session: 40 min  . Stress: To some extent  Relationships  . Social connections:    Talks on phone: More than three times a week    Gets together: More than three times a week    Attends religious service: More than 4 times per year    Active member of club or organization: Yes    Attends meetings of clubs or organizations: More than 4 times per year    Relationship status: Married  . Intimate partner violence:    Fear of current or ex partner: No     Emotionally abused: No    Physically abused: No    Forced sexual activity: No  Other Topics Concern  . Not on file  Social History Narrative  . Not on file    Allergies: No Known Allergies  Current Medications: Current Outpatient Medications  Medication Sig Dispense Refill  . ALPRAZolam (XANAX) 0.25 MG tablet Take 1 tablet (0.25 mg total) by mouth 2 (two) times daily as needed for anxiety. 30 tablet 1  . aspirin EC 81 MG tablet Take 1 tablet (81 mg total) by mouth daily. 30 tablet 11  . atorvastatin (LIPITOR) 80 MG tablet One a day 90 tablet 1  . lisinopril (PRINIVIL,ZESTRIL) 10 MG tablet One bid 180 tablet 1  . NITROSTAT 0.4 MG SL tablet 1 TABLET UNDER TONGUE EVERY 5 MINUTES AS NEEDED FOR CHEST PAIN. MAY REPEAT UP TO 3 DOSES, IF NO RELIEF GO TO HOSP.. 25 tablet 3  . nystatin-triamcinolone ointment (MYCOLOG) Apply 1 application topically 2 (two) times daily. 30 g 1   No current facility-administered medications for this visit.     Review of Systems General: no complaints  HEENT: no complaints  Lungs: no complaints  Cardiac: no complaints  GI: no complaints  GU: no complaints  Musculoskeletal: no complaints  Extremities: no complaints  Skin: basal cell carcinomas removed recently by dermatology  Neuro: no complaints  Endocrine: no complaints  Psych: no complaints      Objective:  Physical Examination:  There were no vitals taken for this visit.   ECOG Performance Status: 0 - Asymptomatic  General appearance: alert, cooperative and appears stated age HEENT:PERRLA, extra ocular movement intact and sclera clear, anicteric Lymph node survey: non-palpable, axillary, inguinal, supraclavicular Cardiovascular: regular rate and rhythm Respiratory: normal air entry, lungs clear to auscultation Abdomen: soft, non-tender, without masses or organomegaly, no hernias and well healed incision Back: inspection of back is normal Extremities: extremities normal, atraumatic, no  cyanosis or edema Skin exam - numerous actinic skin lesions present; dressings over recent excisions Neurological exam reveals alert, oriented, normal speech, no focal findings or movement disorder noted.  Pelvic: exam chaperoned by nurse practitioner;  Vulva: normal appearing vulva with no masses, tenderness or lesions; Vagina: normal vagina; Adnexa: no masses, surgically absent bilateral; Uterus: surgically absent, vaginal cuff well healed; Cervix: absent; Rectal: normal rectal, no masses  Lab Review  Lab Results  Component Value Date   CA125 20.5 05/25/2016    Radiologic Imaging: None    Assessment:  Phyllis Ross is a 83 y.o. female diagnosed with history of stage IIIB  ovarian cancer, clinically NED. Normal CA125.   Plan:   Problem List Items Addressed This Visit      Other   History of ovarian cancer - Primary     I recommended return to clinic in  1 year for continued surveillance and repeat CA125.  Phyllis Au, NP  I personally reviewed the patient's history, completed key elements of her exam, and was involved in decision making in conjunction with Ms. Laury Huizar.   Gillis Ends, MD  CC:  Juline Patch, MD [address] [place], [place] [address] [phone]

## 2018-06-11 ENCOUNTER — Ambulatory Visit
Admission: RE | Admit: 2018-06-11 | Discharge: 2018-06-11 | Disposition: A | Discharge status: Home / Self Care | Payer: Medicare Other | Source: Ambulatory Visit | Attending: Family Medicine | Admitting: Family Medicine

## 2018-06-11 ENCOUNTER — Other Ambulatory Visit: Payer: Self-pay

## 2018-06-11 DIAGNOSIS — Z780 Asymptomatic menopausal state: Secondary | ICD-10-CM | POA: Insufficient documentation

## 2018-06-20 ENCOUNTER — Inpatient Hospital Stay: Payer: Medicare Other

## 2018-07-12 ENCOUNTER — Other Ambulatory Visit: Payer: Self-pay | Admitting: Family Medicine

## 2018-07-12 DIAGNOSIS — I10 Essential (primary) hypertension: Secondary | ICD-10-CM

## 2018-07-12 DIAGNOSIS — E7849 Other hyperlipidemia: Secondary | ICD-10-CM

## 2018-07-18 ENCOUNTER — Encounter: Payer: Self-pay | Admitting: Family Medicine

## 2018-07-18 ENCOUNTER — Other Ambulatory Visit: Payer: Self-pay

## 2018-07-18 ENCOUNTER — Ambulatory Visit (INDEPENDENT_AMBULATORY_CARE_PROVIDER_SITE_OTHER): Payer: Medicare Other | Admitting: Family Medicine

## 2018-07-18 VITALS — BP 130/70 | HR 60 | Ht 66.0 in | Wt 127.0 lb

## 2018-07-18 DIAGNOSIS — R69 Illness, unspecified: Secondary | ICD-10-CM | POA: Diagnosis not present

## 2018-07-18 DIAGNOSIS — I700 Atherosclerosis of aorta: Secondary | ICD-10-CM | POA: Diagnosis not present

## 2018-07-18 DIAGNOSIS — E7849 Other hyperlipidemia: Secondary | ICD-10-CM | POA: Diagnosis not present

## 2018-07-18 DIAGNOSIS — I10 Essential (primary) hypertension: Secondary | ICD-10-CM | POA: Diagnosis not present

## 2018-07-18 DIAGNOSIS — S29011A Strain of muscle and tendon of front wall of thorax, initial encounter: Secondary | ICD-10-CM

## 2018-07-18 MED ORDER — LISINOPRIL 10 MG PO TABS: ORAL_TABLET | ORAL | 1 refills | Status: DC

## 2018-07-18 MED ORDER — ATORVASTATIN CALCIUM 80 MG PO TABS: ORAL_TABLET | ORAL | 1 refills | Status: DC

## 2018-07-18 NOTE — Progress Notes (Signed)
Date:  07/18/2018   Name:  Phyllis Ross   DOB:  June 30, 1935   MRN:  [id number]   Chief Complaint: Hypertension and Hyperlipidemia  Hypertension  This is a chronic problem. The current episode started more than 1 year ago. The problem is unchanged. The problem is controlled. Pertinent negatives include no anxiety, blurred vision, chest pain, headaches, malaise/fatigue, neck pain, orthopnea, palpitations, peripheral edema, PND, shortness of breath or sweats. There are no associated agents to hypertension. There are no known risk factors for coronary artery disease. Past treatments include ACE inhibitors. The current treatment provides moderate improvement. There are no compliance problems.  There is no history of angina, kidney disease, CAD/MI, CVA, heart failure, left ventricular hypertrophy, PVD or retinopathy. There is no history of chronic renal disease, a hypertension causing med or renovascular disease.  Hyperlipidemia  This is a chronic problem. The current episode started more than 1 year ago. The problem is controlled. Recent lipid tests were reviewed and are normal. She has no history of chronic renal disease, diabetes, hypothyroidism, liver disease, obesity or nephrotic syndrome. Pertinent negatives include no chest pain, focal sensory loss, focal weakness, leg pain, myalgias or shortness of breath. Current antihyperlipidemic treatment includes statins. The current treatment provides moderate improvement of lipids. There are no compliance problems.  Risk factors for coronary artery disease include dyslipidemia and hypertension.  Chest Pain   This is a new problem. The current episode started more than 1 month ago (76mos). The onset quality is sudden. The problem occurs daily. The pain is present in the lateral region (left). The pain is moderate. The quality of the pain is described as sharp. Pertinent negatives include no abdominal pain, back pain, cough, diaphoresis, dizziness,  exertional chest pressure, fever, headaches, leg pain, malaise/fatigue, nausea, numbness, orthopnea, palpitations, PND, shortness of breath, vomiting or weakness. The pain is aggravated by nothing. She has tried nothing for the symptoms.  Her past medical history is significant for hyperlipidemia and hypertension.  Pertinent negatives for past medical history include no diabetes and no PVD.    Review of Systems  Constitutional: Negative.  Negative for chills, diaphoresis, fatigue, fever, malaise/fatigue and unexpected weight change.  HENT: Negative for congestion, ear discharge, ear pain, rhinorrhea, sinus pressure, sneezing and sore throat.   Eyes: Negative for blurred vision, photophobia, pain, discharge, redness and itching.  Respiratory: Negative for cough, shortness of breath, wheezing and stridor.   Cardiovascular: Negative for chest pain, palpitations, orthopnea and PND.  Gastrointestinal: Negative for abdominal pain, blood in stool, constipation, diarrhea, nausea and vomiting.  Endocrine: Negative for cold intolerance, heat intolerance, polydipsia, polyphagia and polyuria.  Genitourinary: Negative for dysuria, flank pain, frequency, hematuria, menstrual problem, pelvic pain, urgency, vaginal bleeding and vaginal discharge.  Musculoskeletal: Negative for arthralgias, back pain, myalgias and neck pain.  Skin: Negative for rash.  Allergic/Immunologic: Negative for environmental allergies and food allergies.  Neurological: Negative for dizziness, focal weakness, weakness, light-headedness, numbness and headaches.  Hematological: Negative for adenopathy. Does not bruise/bleed easily.  Psychiatric/Behavioral: Negative for dysphoric mood. The patient is not nervous/anxious.     Patient Active Problem List   Diagnosis Date Noted  . Atherosclerosis of abdominal aorta ([place]) 12/26/2017  . Age-related osteoporosis without current pathological fracture 06/16/2016  . History of ovarian cancer  05/20/2015  . 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  . Wedging of vertebra ([place]) 10/13/2014  . Polypharmacy 10/13/2014  . Encounter for general adult medical examination without abnormal findings 10/13/2014  . Anxiety  10/13/2014  . Essential hypertension 08/27/2014  . Bradycardia 08/27/2014  . Coronary artery disease of native heart with stable angina pectoris ([place]) 08/27/2014  . MI (mitral incompetence) 08/27/2014  . TI (tricuspid incompetence) 08/27/2014  . Combined fat and carbohydrate induced hyperlipemia 08/13/2014  . HTN (hypertension), malignant 08/11/2014  . Chest pain 08/11/2014  . CAD (coronary artery disease) of artery bypass graft 08/11/2014    No Known Allergies  Past Surgical History:  Procedure Laterality Date  . COLONOSCOPY  2011   normal- Dr Vira Agar  . CORONARY ARTERY BYPASS GRAFT    . VAGINAL HYSTERECTOMY     DUB    Social History   Tobacco Use  . Smoking status: Never Smoker  . Smokeless tobacco: Never Used  Substance Use Topics  . Alcohol use: No  . Drug use: No     Medication list has been reviewed and updated.  Current Meds  Medication Sig  . ALPRAZolam (XANAX) 0.25 MG tablet Take 1 tablet (0.25 mg total) by mouth 2 (two) times daily as needed for anxiety.  Marland Kitchen aspirin EC 81 MG tablet Take 1 tablet (81 mg total) by mouth daily.  Marland Kitchen atorvastatin (LIPITOR) 80 MG tablet TAKE (1) TABLET BY MOUTH EVERY DAY  . lisinopril (PRINIVIL,ZESTRIL) 10 MG tablet TAKE ONE TABLET BY MOUTH TWICE DAILY  . NITROSTAT 0.4 MG SL tablet 1 TABLET UNDER TONGUE EVERY 5 MINUTES AS NEEDED FOR CHEST PAIN. MAY REPEAT UP TO 3 DOSES, IF NO RELIEF GO TO HOSP.Marland Kitchen    PHQ 2/9 Scores 07/18/2018 05/14/2018 12/08/2017 12/08/2017  PHQ - 2 Score 0 0 0 0  PHQ- 9 Score 0 - 0 -    BP Readings from Last 3 Encounters:  07/18/18 130/70  05/28/18 112/70  05/14/18 136/84    Physical Exam Vitals signs and nursing note reviewed.  Constitutional:      General: She is not  in acute distress.    Appearance: She is not diaphoretic.  HENT:     Head: Normocephalic and atraumatic.     Right Ear: Tympanic membrane, ear canal and external ear normal.     Left Ear: Tympanic membrane, ear canal and external ear normal.     Nose: Nose normal. No congestion or rhinorrhea.  Eyes:     General:        Right eye: No discharge.        Left eye: No discharge.     Conjunctiva/sclera: Conjunctivae normal.     Pupils: Pupils are equal, round, and reactive to light.  Neck:     Musculoskeletal: Normal range of motion and neck supple.     Thyroid: No thyromegaly.     Vascular: No JVD.  Cardiovascular:     Rate and Rhythm: Normal rate and regular rhythm.     Heart sounds: Normal heart sounds. No murmur. No friction rub. No gallop.   Pulmonary:     Effort: Pulmonary effort is normal. No respiratory distress.     Breath sounds: Normal breath sounds. No stridor. No wheezing, rhonchi or rales.  Chest:     Chest wall: Tenderness present.    Abdominal:     General: Bowel sounds are normal.     Palpations: Abdomen is soft. There is no mass.     Tenderness: There is no abdominal tenderness. There is no guarding.  Musculoskeletal: Normal range of motion.  Lymphadenopathy:     Cervical: No cervical adenopathy.  Skin:    General: Skin is warm and dry.  Capillary Refill: Capillary refill takes less than 2 seconds.  Neurological:     Mental Status: She is alert.     Deep Tendon Reflexes: Reflexes are normal and symmetric.     Wt Readings from Last 3 Encounters:  07/18/18 127 lb (57.6 kg)  05/28/18 131 lb (59.4 kg)  05/14/18 127 lb 9.6 oz (57.9 kg)    BP 130/70   Pulse 60   Ht 5\' 6"  (1.676 m)   Wt 127 lb (57.6 kg)   BMI 20.50 kg/m   Assessment and Plan: 1. Essential hypertension Chronic.  Controlled.  Continue lisinopril 10 mg once a day and will check renal function panel. - Renal Function Panel - lisinopril (PRINIVIL,ZESTRIL) 10 MG tablet; TAKE ONE TABLET BY  MOUTH TWICE DAILY  Dispense: 180 tablet; Refill: 1  2. Familial multiple lipoprotein-type hyperlipidemia Chronic.  Controlled.  Continue atorvastatin 80 mg once a day - atorvastatin (LIPITOR) 80 MG tablet; TAKE (1) TABLET BY MOUTH EVERY DAY  Dispense: 90 tablet; Refill: 1  3. Atherosclerosis of abdominal aorta [facility]) Patient will continue over-the-counter the 1 mg aspirin.  4. Taking medication for chronic disease Patient is taking statin agent for which we will check hepatic function panel to rule out hepatotoxicity. - Hepatic function panel  5. Intercostal muscle strain, initial encounter Patient has new onset of sharp discomfort in the left posterior lateral aspect of the chest wall in the rib area.  There is tenderness noted primarily in the intercostal spaces.  This is consistent with a chest wall issue probably a strain of the intercostal muscle.  Will take Tylenol as needed and if continues we will further evaluate.

## 2018-07-18 NOTE — Patient Instructions (Signed)
This information is directly available on the CDC website: RunningShows.co.za.html    Source:CDC Reference to specific commercial products, manufacturers, companies, or trademarks does not constitute its endorsement or recommendation by the [place], [place], or Centers for [employer] and [employer]. Chest Wall Pain Chest wall pain is pain in or around the bones and muscles of your chest. Sometimes, an injury causes this pain. Excessive coughing or overuse of arm and chest muscles may also cause chest wall pain. Sometimes, the cause may not be known. This pain may take several weeks or longer to get better. Follow these instructions at home: Managing pain, stiffness, and swelling   If directed, put ice on the painful area: ? Put ice in a plastic bag. ? Place a towel between your skin and the bag. ? Leave the ice on for 20 minutes, 2-3 times per day. Activity  Rest as told by your health care provider.  Avoid activities that cause pain. These include any activities that use your chest muscles or your abdominal and side muscles to lift heavy items. Ask your health care provider what activities are safe for you. General instructions   Take over-the-counter and prescription medicines only as told by your health care provider.  Do not use any products that contain nicotine or tobacco, such as cigarettes, e-cigarettes, and chewing tobacco. These can delay healing after injury. If you need help quitting, ask your health care provider.  Keep all follow-up visits as told by your health care provider. This is important. Contact a health care provider if:  You have a fever.  Your chest pain becomes worse.  You have new symptoms. Get help right away if:  You have nausea or vomiting.  You feel sweaty or light-headed.  You have a cough with mucus from your lungs (sputum) or you cough up  blood.  You develop shortness of breath. These symptoms may represent a serious problem that is an emergency. Do not wait to see if the symptoms will go away. Get medical help right away. Call your local emergency services (911 in the [place] Do not drive yourself to the hospital. Summary  Chest wall pain is pain in or around the bones and muscles of your chest.  Depending on the cause, it may be treated with ice, rest, medicines, and avoiding activities that cause pain.  Contact a health care provider if you have a fever, worsening chest pain, or new symptoms.  Get help right away if you feel light-headed or you develop shortness of breath. These symptoms may be an emergency. This information is not intended to replace advice given to you by your health care provider. Make sure you discuss any questions you have with your health care provider. Document Released: 03/21/2005 Document Revised: 09/21/2017 Document Reviewed: 09/21/2017 Elsevier Interactive Patient Education  2019 [employer].

## 2018-07-19 LAB — RENAL FUNCTION PANEL
Albumin: 4.1 g/dL (ref 3.7–4.7)
BUN/Creatinine Ratio: 17 (ref 12–28)
BUN: 17 mg/dL (ref 8–27)
CO2: 27 mmol/L (ref 20–29)
Calcium: 9.7 mg/dL (ref 8.7–10.3)
Chloride: 103 mmol/L (ref 96–106)
Creatinine, Ser: 1.02 mg/dL — ABNORMAL HIGH (ref 0.57–1.00)
GFR calc Af Amer: 60 mL/min/{1.73_m2} (ref >59)
GFR calc non Af Amer: 52 mL/min/{1.73_m2} — ABNORMAL LOW (ref >59)
Glucose: 91 mg/dL (ref 65–99)
Phosphorus: 3.8 mg/dL (ref 3.0–4.3)
Potassium: 5.7 mmol/L — ABNORMAL HIGH (ref 3.5–5.2)
Sodium: 143 mmol/L (ref 134–144)

## 2018-07-19 LAB — HEPATIC FUNCTION PANEL
ALT: 14 IU/L (ref 0–32)
AST: 15 IU/L (ref 0–40)
Alkaline Phosphatase: 81 IU/L (ref 39–117)
Bilirubin Total: 0.7 mg/dL (ref 0.0–1.2)
Bilirubin, Direct: 0.18 mg/dL (ref 0.00–0.40)
Total Protein: 6.7 g/dL (ref 6.0–8.5)

## 2018-07-25 ENCOUNTER — Encounter: Payer: Self-pay | Admitting: Obstetrics and Gynecology

## 2018-07-25 ENCOUNTER — Inpatient Hospital Stay: Payer: Medicare Other | Attending: Obstetrics and Gynecology | Admitting: Obstetrics and Gynecology

## 2018-07-25 ENCOUNTER — Other Ambulatory Visit: Payer: Self-pay

## 2018-07-25 ENCOUNTER — Telehealth: Payer: Self-pay

## 2018-07-25 DIAGNOSIS — Z08 Encounter for follow-up examination after completed treatment for malignant neoplasm: Secondary | ICD-10-CM | POA: Diagnosis not present

## 2018-07-25 DIAGNOSIS — Z8543 Personal history of malignant neoplasm of ovary: Secondary | ICD-10-CM | POA: Diagnosis not present

## 2018-07-25 DIAGNOSIS — Z9221 Personal history of antineoplastic chemotherapy: Secondary | ICD-10-CM

## 2018-07-25 NOTE — Telephone Encounter (Signed)
Called and spoke with Phyllis Ross. Due to COVID-19 we would like to alter her visit to virtual. Ms. Hinkle does not have access to perform a virtual visit. Visit changed to telephone.

## 2018-07-25 NOTE — Addendum Note (Signed)
Addended by: Verlon Au on: 07/25/2018 01:37 PM   Modules accepted: Orders

## 2018-07-25 NOTE — Progress Notes (Signed)
Gynecologic Oncology Visit  Virtual Visit via Telephone Note   I connected with [place] Phyllis Ross on 07/25/2018 at 1:00 PM EST by telephone visit and verified that I am speaking with the correct person using two identifiers.   I discussed the limitations, risks, security and privacy concerns of performing an evaluation and management service by telemedicine and the availability of in-person appointments. I also discussed with the patient that there may be a patient responsible charge related to this service. The patient expressed understanding and agreed to proceed.   Other persons participating in the visit and their role in the encounter: Santiago Glad, MD, Danella Sensing (patient), Beckey Rutter, NP, Mariea Clonts, RN  Patient's location: home  Provider's location: home office  Chief Complaint: Ovarian Cancer Surveillance  Patient agreed to evaluation by telephone/telemedicine to discuss annual surveillance of ovarian cancer.   Chief Concern: Ovarian cancer surveillance  Subjective:  Phyllis Ross is a 83 y.o. female who has a history of stage IIIb ovarian adenocarcinoma s/p surgical cytoreduction in 10/2003 followed by paclitaxel and carboplatin chemotherapy completed 04/2004.  She presents today for annual surveillance/follow-up.   Last CA 125 was 05/15/2017 and was 25.0. She was last seen by Dr. Theora Gianotti on 05/31/2017 with a negative exam.   Today she says she feels at her baseline and denies specific complaints.    Gynecologic Oncology History Phyllis Ross is a pleasant female who has a history of stage IIIb ovarian adenocarcinoma s/p surgical cytoreduction in 10/2003 followed by paclitaxel and carboplatin chemotherapy completed 04/2004. Serial CA125 negative.  Clinically NED.   Nov 2015 BI-RADS CATEGORY  1: Negative. Per her report she also had a mammogram last year that was negative. Ordered by Dr. Ronnald Ramp. She did not have a mammogram since b/c of discomfort with the exam.    Last CA125 05/2016 negative exam and CA125 was 25 on 05/15/2017.   Problem List: Patient Active Problem List   Diagnosis Date Noted  . Atherosclerosis of abdominal aorta ([place]) 12/26/2017  . Age-related osteoporosis without current pathological fracture 06/16/2016  . History of ovarian cancer 05/20/2015  . 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  . Wedging of vertebra ([place]) 10/13/2014  . Polypharmacy 10/13/2014  . Encounter for general adult medical examination without abnormal findings 10/13/2014  . Anxiety 10/13/2014  . Essential hypertension 08/27/2014  . Bradycardia 08/27/2014  . Coronary artery disease of native heart with stable angina pectoris ([place]) 08/27/2014  . MI (mitral incompetence) 08/27/2014  . TI (tricuspid incompetence) 08/27/2014  . Combined fat and carbohydrate induced hyperlipemia 08/13/2014  . HTN (hypertension), malignant 08/11/2014  . Chest pain 08/11/2014  . CAD (coronary artery disease) of artery bypass graft 08/11/2014    Past Medical History: Past Medical History:  Diagnosis Date  . Anxiety   . Cancer ([place])   . Hyperlipidemia   . Hypertension   . MI (myocardial infarction) ([place])   . Osteoporosis   . Rheumatic fever     Past Surgical History: Past Surgical History:  Procedure Laterality Date  . COLONOSCOPY  2011   normal- Dr Vira Agar  . CORONARY ARTERY BYPASS GRAFT    . VAGINAL HYSTERECTOMY     DUB    Past Gynecologic History:  As per Oncology history  OB History:  OB History  No obstetric history on file.    Family History: Family History  Problem Relation Age of Onset  . Heart attack Mother   . Heart attack Maternal Uncle   . Heart attack Maternal Uncle  Social History: Social History   Socioeconomic History  . Marital status: Married    Spouse name: Not on file  . Number of children: 2  . Years of education: Not on file  . Highest education level: Some college, no degree  Occupational History     Employer: RETIRED  Social Needs  . Financial resource strain: Not hard at all  . Food insecurity:    Worry: Never true    Inability: Never true  . Transportation needs:    Medical: No    Non-medical: No  Tobacco Use  . Smoking status: Never Smoker  . Smokeless tobacco: Never Used  Substance and Sexual Activity  . Alcohol use: No  . Drug use: No  . Sexual activity: Yes  Lifestyle  . Physical activity:    Days per week: 6 days    Minutes per session: 40 min  . Stress: To some extent  Relationships  . Social connections:    Talks on phone: More than three times a week    Gets together: More than three times a week    Attends religious service: More than 4 times per year    Active member of club or organization: Yes    Attends meetings of clubs or organizations: More than 4 times per year    Relationship status: Married  . Intimate partner violence:    Fear of current or ex partner: No    Emotionally abused: No    Physically abused: No    Forced sexual activity: No  Other Topics Concern  . Not on file  Social History Narrative  . Not on file    Allergies: No Known Allergies  Current Medications: Current Outpatient Medications  Medication Sig Dispense Refill  . ALPRAZolam (XANAX) 0.25 MG tablet Take 1 tablet (0.25 mg total) by mouth 2 (two) times daily as needed for anxiety. 30 tablet 1  . aspirin EC 81 MG tablet Take 1 tablet (81 mg total) by mouth daily. 30 tablet 11  . atorvastatin (LIPITOR) 80 MG tablet TAKE (1) TABLET BY MOUTH EVERY DAY 90 tablet 1  . lisinopril (PRINIVIL,ZESTRIL) 10 MG tablet TAKE ONE TABLET BY MOUTH TWICE DAILY 180 tablet 1  . NITROSTAT 0.4 MG SL tablet 1 TABLET UNDER TONGUE EVERY 5 MINUTES AS NEEDED FOR CHEST PAIN. MAY REPEAT UP TO 3 DOSES, IF NO RELIEF GO TO HOSP.. 25 tablet 3   No current facility-administered medications for this visit.     Review of Systems General:  no complaints Eyes: no complaints; right eye discomfort when she  touches her eye. She is trying to get an appt with [facility]: no complaints Pulmonary: no complaints Cardiac: no complaints Gastrointestinal: no complaints Genitourinary/Sexual: no complaints Ob/Gyn: no complaints Musculoskeletal: no complaints Hematology: no complaints Neurologic/Psych: no complaints    Objective:  Physical Examination:  There were no vitals taken for this visit.   ECOG Performance Status: 0 - Asymptomatic   Lab Review  Lab Results  Component Value Date   CA125 20.5 05/25/2016    Radiologic Imaging: None    Assessment:  CLIO GERHART is a 83 y.o. female diagnosed with history of stage IIIB  ovarian cancer, clinically NED. Normal CA125 at last visit. No concerning symptoms for recurrence.  Plan:   Problem List Items Addressed This Visit      Other   History of ovarian cancer - Primary     I recommended return to clinic in 3-4 months for  continued surveillance and repeat CA125 and exam.   I discussed the assessment and treatment plan with the patient. The patient was provided an opportunity to ask questions and all were answered. The patient agreed with the plan and demonstrated an understanding of the instructions.   The patient was advised to call back or seek an in-person evaluation if develops any concerning symptoms.   I provided 11 minutes of non face-to-face telephone visit time during this encounter, and > 50% was spent counseling as documented under my assessment & plan.   Beckey Rutter, DNP, AGNP-C [place] [facility] [phone] (work cell) [phone] (office)  I personally reviewed the patient's history, ROS, and was involved in decision making in conjunction with Ms. Allen.   Mannie [place] Gaetana Michaelis, MD   CC:  Juline Patch, MD [address] [place], [place] [address] [phone]

## 2018-10-31 ENCOUNTER — Inpatient Hospital Stay: Payer: Medicare Other

## 2018-11-06 ENCOUNTER — Other Ambulatory Visit: Payer: Self-pay

## 2018-11-07 ENCOUNTER — Encounter (INDEPENDENT_AMBULATORY_CARE_PROVIDER_SITE_OTHER): Payer: Self-pay

## 2018-11-07 ENCOUNTER — Inpatient Hospital Stay: Payer: Medicare Other

## 2018-11-07 ENCOUNTER — Other Ambulatory Visit: Payer: Self-pay

## 2018-11-07 ENCOUNTER — Inpatient Hospital Stay: Payer: Medicare Other | Attending: Obstetrics and Gynecology | Admitting: Obstetrics and Gynecology

## 2018-11-07 VITALS — BP 135/68 | HR 67 | Temp 97.7°F | Ht 66.0 in | Wt 128.1 lb

## 2018-11-07 DIAGNOSIS — I2510 Atherosclerotic heart disease of native coronary artery without angina pectoris: Secondary | ICD-10-CM | POA: Insufficient documentation

## 2018-11-07 DIAGNOSIS — M810 Age-related osteoporosis without current pathological fracture: Secondary | ICD-10-CM | POA: Insufficient documentation

## 2018-11-07 DIAGNOSIS — Z79899 Other long term (current) drug therapy: Secondary | ICD-10-CM | POA: Insufficient documentation

## 2018-11-07 DIAGNOSIS — Z951 Presence of aortocoronary bypass graft: Secondary | ICD-10-CM | POA: Diagnosis not present

## 2018-11-07 DIAGNOSIS — I10 Essential (primary) hypertension: Secondary | ICD-10-CM | POA: Insufficient documentation

## 2018-11-07 DIAGNOSIS — I252 Old myocardial infarction: Secondary | ICD-10-CM | POA: Diagnosis not present

## 2018-11-07 DIAGNOSIS — Z9221 Personal history of antineoplastic chemotherapy: Secondary | ICD-10-CM | POA: Diagnosis not present

## 2018-11-07 DIAGNOSIS — Z7982 Long term (current) use of aspirin: Secondary | ICD-10-CM | POA: Insufficient documentation

## 2018-11-07 DIAGNOSIS — Z8543 Personal history of malignant neoplasm of ovary: Secondary | ICD-10-CM | POA: Diagnosis present

## 2018-11-07 DIAGNOSIS — F419 Anxiety disorder, unspecified: Secondary | ICD-10-CM | POA: Diagnosis not present

## 2018-11-07 DIAGNOSIS — E7801 Familial hypercholesterolemia: Secondary | ICD-10-CM | POA: Diagnosis not present

## 2018-11-07 DIAGNOSIS — E785 Hyperlipidemia, unspecified: Secondary | ICD-10-CM | POA: Diagnosis not present

## 2018-11-07 DIAGNOSIS — R001 Bradycardia, unspecified: Secondary | ICD-10-CM | POA: Diagnosis not present

## 2018-11-07 DIAGNOSIS — I25810 Atherosclerosis of coronary artery bypass graft(s) without angina pectoris: Secondary | ICD-10-CM | POA: Insufficient documentation

## 2018-11-07 NOTE — Progress Notes (Signed)
Gynecologic Oncology Interval Visit   Chief Concern: Ovarian cancer surveillance  Subjective:  Phyllis Ross is a 83 y.o. female who has a history of stage IIIb ovarian adenocarcinoma s/p surgical cytoreduction in 10/2003 followed by paclitaxel and carboplatin chemotherapy completed 04/2004.    Last CA-125 pending  Last CA125 05/2016 negative exam and CA125 was 25 on 05/15/2017. We did a virtual visit in 07/2018 due to [place] and she was doing well.  No concerning symptoms today.   Gynecologic Oncology History Phyllis Ross is a pleasant female who has a history of stage IIIb ovarian adenocarcinoma s/p surgical cytoreduction in 10/2003 followed by paclitaxel and carboplatin chemotherapy completed 04/2004. Serial CA125 negative.  Clinically NED.   Nov 2015 BI-RADS CATEGORY  1: Negative. Per her report she also had a mammogram last year that was negative. Ordered by Dr. Ronnald Ramp. She did not have a mammogram since b/c of discomfort with the exam.   Problem List: Patient Active Problem List   Diagnosis Date Noted  . Atherosclerosis of abdominal aorta ([place]) 12/26/2017  . Age-related osteoporosis without current pathological fracture 06/16/2016  . History of ovarian cancer 05/20/2015  . 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  . Wedging of vertebra ([place]) 10/13/2014  . Polypharmacy 10/13/2014  . Encounter for general adult medical examination without abnormal findings 10/13/2014  . Anxiety 10/13/2014  . Essential hypertension 08/27/2014  . Bradycardia 08/27/2014  . Coronary artery disease of native heart with stable angina pectoris ([place]) 08/27/2014  . MI (mitral incompetence) 08/27/2014  . TI (tricuspid incompetence) 08/27/2014  . Combined fat and carbohydrate induced hyperlipemia 08/13/2014  . HTN (hypertension), malignant 08/11/2014  . Chest pain 08/11/2014  . CAD (coronary artery disease) of artery bypass graft 08/11/2014    Past Medical History: Past Medical  History:  Diagnosis Date  . Anxiety   . Cancer ([place])   . Hyperlipidemia   . Hypertension   . MI (myocardial infarction) ([place])   . Osteoporosis   . Rheumatic fever     Past Surgical History: Past Surgical History:  Procedure Laterality Date  . COLONOSCOPY  2011   normal- Dr Vira Agar  . CORONARY ARTERY BYPASS GRAFT    . VAGINAL HYSTERECTOMY     DUB    Past Gynecologic History:  As per Oncology history  OB History:  OB History  No obstetric history on file.    Family History: Family History  Problem Relation Age of Onset  . Heart attack Mother   . Heart attack Maternal Uncle   . Heart attack Maternal Uncle     Social History: Social History   Socioeconomic History  . Marital status: Married    Spouse name: Not on file  . Number of children: 2  . Years of education: Not on file  . Highest education level: Some college, no degree  Occupational History    Employer: RETIRED  Social Needs  . Financial resource strain: Not hard at all  . Food insecurity    Worry: Never true    Inability: Never true  . Transportation needs    Medical: No    Non-medical: No  Tobacco Use  . Smoking status: Never Smoker  . Smokeless tobacco: Never Used  Substance and Sexual Activity  . Alcohol use: No  . Drug use: No  . Sexual activity: Yes  Lifestyle  . Physical activity    Days per week: 6 days    Minutes per session: 40 min  . Stress: To some extent  Relationships  . Social connections    Talks on phone: More than three times a week    Gets together: More than three times a week    Attends religious service: More than 4 times per year    Active member of club or organization: Yes    Attends meetings of clubs or organizations: More than 4 times per year    Relationship status: Married  . Intimate partner violence    Fear of current or ex partner: No    Emotionally abused: No    Physically abused: No    Forced sexual activity: No  Other Topics Concern  . Not on  file  Social History Narrative  . Not on file    Allergies: No Known Allergies  Current Medications: Current Outpatient Medications  Medication Sig Dispense Refill  . ALPRAZolam (XANAX) 0.25 MG tablet Take 1 tablet (0.25 mg total) by mouth 2 (two) times daily as needed for anxiety. 30 tablet 1  . aspirin EC 81 MG tablet Take 1 tablet (81 mg total) by mouth daily. 30 tablet 11  . atorvastatin (LIPITOR) 80 MG tablet TAKE (1) TABLET BY MOUTH EVERY DAY 90 tablet 1  . lisinopril (PRINIVIL,ZESTRIL) 10 MG tablet TAKE ONE TABLET BY MOUTH TWICE DAILY 180 tablet 1  . NITROSTAT 0.4 MG SL tablet 1 TABLET UNDER TONGUE EVERY 5 MINUTES AS NEEDED FOR CHEST PAIN. MAY REPEAT UP TO 3 DOSES, IF NO RELIEF GO TO HOSP.. 25 tablet 3   No current facility-administered medications for this visit.    Review of Systems General: no complaints  HEENT: no complaints  Lungs: no complaints  Cardiac: no complaints  GI: no complaints  GU: no complaints  Musculoskeletal: no complaints  Extremities: no complaints  Skin: skin rash and she is seeing her dermatologist  Neuro: no complaints  Endocrine: no complaints  Psych: no complaints       Objective:  Physical Examination:  BP 135/68 (BP Location: Left Arm)   Pulse 67   Temp 97.7 F (36.5 C) (Tympanic)   Ht 5\' 6"  (1.676 m)   Wt 128 lb 1.6 oz (58.1 kg)   BMI 20.68 kg/m   ECOG Performance Status: 0 - Asymptomatic   General appearance: alert, cooperative and appears stated age 29: ATNC Lymph node survey: non-palpable, axillary, inguinal, supraclavicular Cardiovascular: RRR Respiratory: B CTA. Abdomen: SNDNT. Incision all well healed. No hernias, no masses, no ascites.  Extremities:No edema Skin exam - Well healed incisions.  Neurological exam reveals alert, oriented, normal speech, no focal findings or movement disorder noted  Pelvic:  exam chaperoned by nurse practitioner; Vulva: normal appearing vulva with no masses, tenderness or lesions;  Vagina: normal; Adnexa: negative for masses or nodularity; Uterus and Cervix: surgically absent; Rectal: confirms    Lab Review Lab Results  Component Value Date   CA125 20.5 05/25/2016    Radiologic Imaging: None    Assessment:  Phyllis Ross is a 83 y.o. female diagnosed with history of stage IIIB  ovarian cancer, clinically NED. Normal CA-125 at last in-person visit. Today's CA-125 pending.    Plan:   Problem List Items Addressed This Visit      Other   History of ovarian cancer - Primary   Relevant Orders   CA 125     I recommended return to clinic in  1 year for continued surveillance and repeat CA125.    I personally had a face to face interaction and evaluated the patient jointly with  the NP, Ms. Beckey Rutter.  I have reviewed her history and available records and have performed the key portions of the physical exam including general, lymph node survey, abdominal exam, pelvic exam with my findings confirming those documented above by the APP.  I have discussed the case with the APP and the patient.  I agree with the above documentation, assessment and plan which was fully formulated by me.  Counseling was completed by me.   I personally saw the patient and performed a substantive portion of this encounter in conjunction with the listed APP as documented above.  A total of 25 minutes were spent with the patient/family today; >50% was spent in education, counseling and coordination of care for ovarian cancer.   [place] Gaetana Michaelis, MD    CC:  Juline Patch, MD [address] [place], [place] [address] [phone]

## 2018-11-08 ENCOUNTER — Telehealth: Payer: Self-pay | Admitting: Nurse Practitioner

## 2018-11-08 LAB — CA 125: Cancer Antigen (CA) 125: 26.7 U/mL (ref 0.0–38.1)

## 2018-11-08 NOTE — Telephone Encounter (Signed)
Called with result of ca-125 which was normal. Spoke to patient's husband and asked that she return my call if any questions or concerns.

## 2019-01-21 DIAGNOSIS — Z85828 Personal history of other malignant neoplasm of skin: Secondary | ICD-10-CM | POA: Insufficient documentation

## 2019-02-18 ENCOUNTER — Other Ambulatory Visit: Payer: Self-pay | Admitting: Family Medicine

## 2019-02-18 DIAGNOSIS — I10 Essential (primary) hypertension: Secondary | ICD-10-CM

## 2019-02-18 DIAGNOSIS — E7849 Other hyperlipidemia: Secondary | ICD-10-CM

## 2019-02-19 ENCOUNTER — Other Ambulatory Visit: Payer: Self-pay | Admitting: Family Medicine

## 2019-02-19 ENCOUNTER — Other Ambulatory Visit: Payer: Self-pay

## 2019-02-19 DIAGNOSIS — I10 Essential (primary) hypertension: Secondary | ICD-10-CM

## 2019-02-19 DIAGNOSIS — E7849 Other hyperlipidemia: Secondary | ICD-10-CM

## 2019-02-19 MED ORDER — ATORVASTATIN CALCIUM 80 MG PO TABS: ORAL_TABLET | ORAL | 0 refills | Status: DC

## 2019-02-19 MED ORDER — LISINOPRIL 10 MG PO TABS: ORAL_TABLET | ORAL | 0 refills | Status: DC

## 2019-03-21 ENCOUNTER — Other Ambulatory Visit: Payer: Self-pay

## 2019-03-21 ENCOUNTER — Encounter: Payer: Self-pay | Admitting: Family Medicine

## 2019-03-21 ENCOUNTER — Ambulatory Visit (INDEPENDENT_AMBULATORY_CARE_PROVIDER_SITE_OTHER): Payer: Medicare Other | Admitting: Family Medicine

## 2019-03-21 VITALS — BP 138/70 | HR 72 | Ht 64.5 in | Wt 126.0 lb

## 2019-03-21 DIAGNOSIS — F419 Anxiety disorder, unspecified: Secondary | ICD-10-CM | POA: Diagnosis not present

## 2019-03-21 DIAGNOSIS — R1012 Left upper quadrant pain: Secondary | ICD-10-CM

## 2019-03-21 DIAGNOSIS — F4321 Adjustment disorder with depressed mood: Secondary | ICD-10-CM

## 2019-03-21 DIAGNOSIS — I10 Essential (primary) hypertension: Secondary | ICD-10-CM

## 2019-03-21 DIAGNOSIS — Z8543 Personal history of malignant neoplasm of ovary: Secondary | ICD-10-CM

## 2019-03-21 DIAGNOSIS — E7849 Other hyperlipidemia: Secondary | ICD-10-CM

## 2019-03-21 MED ORDER — ATORVASTATIN CALCIUM 80 MG PO TABS: ORAL_TABLET | ORAL | 1 refills | Status: DC

## 2019-03-21 MED ORDER — ALPRAZOLAM 0.25 MG PO TABS: 0.2500 mg | ORAL_TABLET | Freq: Two times a day (BID) | ORAL | 1 refills | Status: DC | PRN

## 2019-03-21 MED ORDER — LISINOPRIL 10 MG PO TABS: ORAL_TABLET | ORAL | 1 refills | Status: DC

## 2019-03-21 NOTE — Progress Notes (Signed)
Date:  03/21/2019   Name:  Phyllis Ross   DOB:  [birth year]   MRN:  [id number]   Chief Complaint: Hypertension, Hyperlipidemia, Anxiety (PHQ9=0 and GAD7=0), and Flank Pain (L) side off and on x 1 year, worse in past 6 months. hurts more when driving.)  Hypertension This is a chronic problem. The current episode started more than 1 year ago. The problem has been waxing and waning since onset. The problem is controlled. Associated symptoms include anxiety. Pertinent negatives include no blurred vision, chest pain, headaches, malaise/fatigue, neck pain, orthopnea, palpitations, peripheral edema, PND, shortness of breath or sweats. There are no associated agents to hypertension. There are no known risk factors for coronary artery disease. Past treatments include ACE inhibitors. The current treatment provides moderate improvement. There are no compliance problems.  There is no history of angina, kidney disease, CAD/MI, CVA, heart failure, left ventricular hypertrophy, PVD or retinopathy. There is no history of chronic renal disease, a hypertension causing med or renovascular disease.  Hyperlipidemia This is a chronic problem. The current episode started more than 1 year ago. Recent lipid tests were reviewed and are normal. She has no history of chronic renal disease, diabetes, hypothyroidism, liver disease, obesity or nephrotic syndrome. Pertinent negatives include no chest pain, focal sensory loss, focal weakness, leg pain, myalgias or shortness of breath. Current antihyperlipidemic treatment includes statins. The current treatment provides moderate improvement of lipids. There are no compliance problems.  Risk factors for coronary artery disease include hypertension and dyslipidemia.  Anxiety Presents for follow-up visit. Symptoms include excessive worry, nervous/anxious behavior and panic. Patient reports no chest pain, compulsions, confusion, decreased concentration, depressed mood, dizziness,  dry mouth, feeling of choking, hyperventilation, impotence, insomnia, irritability, malaise, muscle tension, nausea, obsessions, palpitations, restlessness, shortness of breath or suicidal ideas. Symptoms occur occasionally. The severity of symptoms is mild.    Flank Pain This is a new problem. The current episode started more than 1 month ago (6 months to a year). The problem has been gradually worsening since onset. The quality of the pain is described as aching. The pain is at a severity of 7/10. The pain is moderate. The symptoms are aggravated by sitting. Associated symptoms include abdominal pain. Pertinent negatives include no bladder incontinence, bowel incontinence, chest pain, dysuria, fever, headaches, leg pain, numbness, paresis, paresthesias, pelvic pain, perianal numbness, tingling, weakness or weight loss. Risk factors include history of cancer (ovarian). She has tried nothing for the symptoms. The treatment provided moderate relief.  Abdominal Pain This is a new problem. The current episode started more than 1 year ago. The onset quality is sudden. The problem occurs daily. The pain is located in the LUQ. The pain is moderate. Pertinent negatives include no constipation, diarrhea, dysuria, fever, flatus, frequency, headaches, hematochezia, hematuria, melena, myalgias, nausea or weight loss.    Lab Results  Component Value Date   CREATININE 1.02 (H) 07/18/2018   BUN 17 07/18/2018   NA 143 07/18/2018   K 5.7 (H) 07/18/2018   CL 103 07/18/2018   CO2 27 07/18/2018   Lab Results  Component Value Date   CHOL 148 12/12/2017   HDL 51 12/12/2017   LDLCALC 78 12/12/2017   TRIG 95 12/12/2017   CHOLHDL 2.9 12/12/2017   No results found for: TSH No results found for: HGBA1C   Review of Systems  Constitutional: Negative for chills, fever, irritability, malaise/fatigue and weight loss.  HENT: Negative for drooling, ear discharge, ear pain and sore throat.  Eyes: Negative for  blurred vision.  Respiratory: Negative for cough, shortness of breath and wheezing.   Cardiovascular: Negative for chest pain, palpitations, orthopnea, leg swelling and PND.  Gastrointestinal: Positive for abdominal pain. Negative for blood in stool, bowel incontinence, constipation, diarrhea, flatus, hematochezia, melena and nausea.  Endocrine: Negative for polydipsia and polyphagia.  Genitourinary: Positive for flank pain. Negative for bladder incontinence, dysuria, frequency, hematuria, impotence, pelvic pain and urgency.  Musculoskeletal: Negative for back pain, myalgias and neck pain.  Skin: Negative for rash.  Allergic/Immunologic: Negative for environmental allergies.  Neurological: Negative for dizziness, tingling, focal weakness, weakness, numbness, headaches and paresthesias.  Hematological: Does not bruise/bleed easily.  Psychiatric/Behavioral: Negative for confusion, decreased concentration and suicidal ideas. The patient is nervous/anxious. The patient does not have insomnia.     Patient Active Problem List   Diagnosis Date Noted  . Atherosclerosis of abdominal aorta ([place]) 12/26/2017  . Age-related osteoporosis without current pathological fracture 06/16/2016  . History of ovarian cancer 05/20/2015  . 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  . Wedging of vertebra ([place]) 10/13/2014  . Polypharmacy 10/13/2014  . Encounter for general adult medical examination without abnormal findings 10/13/2014  . Anxiety 10/13/2014  . Essential hypertension 08/27/2014  . Bradycardia 08/27/2014  . Coronary artery disease of native heart with stable angina pectoris ([place]) 08/27/2014  . MI (mitral incompetence) 08/27/2014  . TI (tricuspid incompetence) 08/27/2014  . Combined fat and carbohydrate induced hyperlipemia 08/13/2014  . HTN (hypertension), malignant 08/11/2014  . Chest pain 08/11/2014  . CAD (coronary artery disease) of artery bypass graft 08/11/2014    No  Known Allergies  Past Surgical History:  Procedure Laterality Date  . COLONOSCOPY  2011   normal- Dr Vira Agar  . CORONARY ARTERY BYPASS GRAFT    . VAGINAL HYSTERECTOMY     DUB    Social History   Tobacco Use  . Smoking status: Never Smoker  . Smokeless tobacco: Never Used  Substance Use Topics  . Alcohol use: No  . Drug use: No     Medication list has been reviewed and updated.  Current Meds  Medication Sig  . aspirin EC 81 MG tablet Take 1 tablet (81 mg total) by mouth daily.  Marland Kitchen atorvastatin (LIPITOR) 80 MG tablet TAKE (1) TABLET BY MOUTH EVERY DAY  . lisinopril (ZESTRIL) 10 MG tablet TAKE ONE TABLET BY MOUTH TWICE DAILY  . NITROSTAT 0.4 MG SL tablet 1 TABLET UNDER TONGUE EVERY 5 MINUTES AS NEEDED FOR CHEST PAIN. MAY REPEAT UP TO 3 DOSES, IF NO RELIEF GO TO HOSP.Marland Kitchen    PHQ 2/9 Scores 03/21/2019 07/18/2018 05/14/2018 12/08/2017  PHQ - 2 Score 0 0 0 0  PHQ- 9 Score 0 0 - 0    BP Readings from Last 3 Encounters:  03/21/19 138/70  11/07/18 135/68  07/18/18 130/70    Physical Exam Vitals and nursing note reviewed.  Constitutional:      General: She is not in acute distress.    Appearance: She is normal weight. She is not diaphoretic.  HENT:     Head: Normocephalic and atraumatic.     Right Ear: Tympanic membrane, ear canal and external ear normal.     Left Ear: Tympanic membrane, ear canal and external ear normal.     Nose: Nose normal.     Mouth/Throat:     Mouth: Mucous membranes are moist.  Eyes:     General:        Right eye: No discharge.  Left eye: No discharge.     Extraocular Movements: Extraocular movements intact.     Conjunctiva/sclera: Conjunctivae normal.     Pupils: Pupils are equal, round, and reactive to light.  Neck:     Thyroid: No thyromegaly.     Vascular: No JVD.  Cardiovascular:     Rate and Rhythm: Normal rate and regular rhythm.     Heart sounds: Normal heart sounds. No murmur. No friction rub. No gallop.   Pulmonary:     Effort:  Pulmonary effort is normal.     Breath sounds: Normal breath sounds. No wheezing, rhonchi or rales.  Abdominal:     General: Bowel sounds are normal.     Palpations: Abdomen is soft. There is no mass.     Tenderness: There is no abdominal tenderness. There is no guarding.  Musculoskeletal:        General: Normal range of motion.     Cervical back: Normal range of motion and neck supple.  Lymphadenopathy:     Cervical: No cervical adenopathy.  Skin:    General: Skin is warm and dry.     Capillary Refill: Capillary refill takes less than 2 seconds.     Coloration: Skin is pale. Skin is not jaundiced.  Neurological:     Mental Status: She is alert.     Deep Tendon Reflexes: Reflexes are normal and symmetric.     Wt Readings from Last 3 Encounters:  03/21/19 126 lb (57.2 kg)  11/07/18 128 lb 1.6 oz (58.1 kg)  07/18/18 127 lb (57.6 kg)    BP 138/70   Pulse 72   Ht 5' 4.5" (1.638 m)   Wt 126 lb (57.2 kg)   BMI 21.29 kg/m   Assessment and Plan:  1. Familial multiple lipoprotein-type hyperlipidemia Chronic.  Controlled.  Stable.  Continue atorvastatin 80 mg once a day.  Will check lipid panel for surveillance. - Lipid Panel With LDL/HDL Ratio - atorvastatin (LIPITOR) 80 MG tablet; TAKE (1) TABLET BY MOUTH EVERY DAY  Dispense: 90 tablet; Refill: 1  2. Essential hypertension Chronic.  Controlled.  Stable.  Will continue lisinopril 10 mg twice a day.  Will check renal function panel. - Renal Function Panel - lisinopril (ZESTRIL) 10 MG tablet; TAKE ONE TABLET BY MOUTH TWICE DAILY  Dispense: 180 tablet; Refill: 1  3. Anxiety Chronic.  Controlled.  Stable.  Patient only takes alprazolam on a as needed basis and has only used 30 tablets over 6 months. - ALPRAZolam (XANAX) 0.25 MG tablet; Take 1 tablet (0.25 mg total) by mouth 2 (two) times daily as needed for anxiety.  Dispense: 30 tablet; Refill: 1  4. History of ovarian cancer Patient with history of ovarian cancer doing well  but because of the new onset of upper quadrant pain we will do an ultrasound of the pelvis as well as the abdomen. - US Abdomen Complete; Future - US Pelvic Complete With Transvaginal; Future  5. Left upper quadrant abdominal pain New onset persistent over 6 months seemingly worsening over the last 6 months.  Patient has continued to have left upper quadrant and flank pain.  Patient does have a history of ovarian cancer and we will obtain ultrasound abdomen and pelvic areas to evaluate for any concern - US Abdomen Complete; Future - US Pelvic Complete With Transvaginal; Future  6. Grief reaction Chronic.  Controlled.  Stable.  Patient is doing well with referred circumstances.  We will continue alprazolam on a as needed basis seeming  that she does not take much over the last 6 months - ALPRAZolam (XANAX) 0.25 MG tablet; Take 1 tablet (0.25 mg total) by mouth 2 (two) times daily as needed for anxiety.  Dispense: 30 tablet; Refill: 1

## 2019-03-22 LAB — RENAL FUNCTION PANEL
Albumin: 4.1 g/dL (ref 3.6–4.6)
BUN/Creatinine Ratio: 19 (ref 12–28)
BUN: 18 mg/dL (ref 8–27)
CO2: 24 mmol/L (ref 20–29)
Calcium: 9.5 mg/dL (ref 8.7–10.3)
Chloride: 104 mmol/L (ref 96–106)
Creatinine, Ser: 0.94 mg/dL (ref 0.57–1.00)
GFR calc Af Amer: 66 mL/min/{1.73_m2} (ref >59)
GFR calc non Af Amer: 57 mL/min/{1.73_m2} — ABNORMAL LOW (ref >59)
Glucose: 93 mg/dL (ref 65–99)
Phosphorus: 4.1 mg/dL (ref 3.0–4.3)
Potassium: 5 mmol/L (ref 3.5–5.2)
Sodium: 143 mmol/L (ref 134–144)

## 2019-03-22 LAB — LIPID PANEL WITH LDL/HDL RATIO
Cholesterol, Total: 168 mg/dL (ref 100–199)
HDL: 54 mg/dL (ref >39)
LDL Chol Calc (NIH): 92 mg/dL (ref 0–99)
LDL/HDL Ratio: 1.7 ratio (ref 0.0–3.2)
Triglycerides: 126 mg/dL (ref 0–149)
VLDL Cholesterol Cal: 22 mg/dL (ref 5–40)

## 2019-03-27 ENCOUNTER — Ambulatory Visit
Admission: RE | Admit: 2019-03-27 | Discharge: 2019-03-27 | Disposition: A | Discharge status: Home / Self Care | Payer: Medicare Other | Source: Ambulatory Visit | Attending: Family Medicine | Admitting: Family Medicine

## 2019-03-27 ENCOUNTER — Other Ambulatory Visit: Payer: Self-pay

## 2019-03-27 ENCOUNTER — Other Ambulatory Visit: Payer: Self-pay | Admitting: Family Medicine

## 2019-03-27 DIAGNOSIS — R1012 Left upper quadrant pain: Secondary | ICD-10-CM

## 2019-03-27 DIAGNOSIS — Z8543 Personal history of malignant neoplasm of ovary: Secondary | ICD-10-CM

## 2019-04-25 DIAGNOSIS — I25810 Atherosclerosis of coronary artery bypass graft(s) without angina pectoris: Secondary | ICD-10-CM | POA: Diagnosis not present

## 2019-04-25 DIAGNOSIS — R9431 Abnormal electrocardiogram [ECG] [EKG]: Secondary | ICD-10-CM | POA: Diagnosis not present

## 2019-04-25 DIAGNOSIS — E782 Mixed hyperlipidemia: Secondary | ICD-10-CM | POA: Diagnosis not present

## 2019-04-25 DIAGNOSIS — I10 Essential (primary) hypertension: Secondary | ICD-10-CM | POA: Diagnosis not present

## 2019-04-25 DIAGNOSIS — I700 Atherosclerosis of aorta: Secondary | ICD-10-CM | POA: Diagnosis not present

## 2019-05-03 DIAGNOSIS — D0439 Carcinoma in situ of skin of other parts of face: Secondary | ICD-10-CM | POA: Diagnosis not present

## 2019-05-03 DIAGNOSIS — D044 Carcinoma in situ of skin of scalp and neck: Secondary | ICD-10-CM | POA: Diagnosis not present

## 2019-05-24 DIAGNOSIS — L570 Actinic keratosis: Secondary | ICD-10-CM | POA: Diagnosis not present

## 2019-05-24 DIAGNOSIS — Z08 Encounter for follow-up examination after completed treatment for malignant neoplasm: Secondary | ICD-10-CM | POA: Diagnosis not present

## 2019-05-24 DIAGNOSIS — D0439 Carcinoma in situ of skin of other parts of face: Secondary | ICD-10-CM | POA: Diagnosis not present

## 2019-05-24 DIAGNOSIS — Z85828 Personal history of other malignant neoplasm of skin: Secondary | ICD-10-CM | POA: Diagnosis not present

## 2019-05-24 DIAGNOSIS — C44612 Basal cell carcinoma of skin of right upper limb, including shoulder: Secondary | ICD-10-CM | POA: Diagnosis not present

## 2019-05-24 DIAGNOSIS — D044 Carcinoma in situ of skin of scalp and neck: Secondary | ICD-10-CM | POA: Diagnosis not present

## 2019-05-24 DIAGNOSIS — D1801 Hemangioma of skin and subcutaneous tissue: Secondary | ICD-10-CM | POA: Diagnosis not present

## 2019-05-24 DIAGNOSIS — Z7189 Other specified counseling: Secondary | ICD-10-CM | POA: Diagnosis not present

## 2019-05-24 DIAGNOSIS — L578 Other skin changes due to chronic exposure to nonionizing radiation: Secondary | ICD-10-CM | POA: Diagnosis not present

## 2019-05-24 DIAGNOSIS — D485 Neoplasm of uncertain behavior of skin: Secondary | ICD-10-CM | POA: Diagnosis not present

## 2019-05-24 DIAGNOSIS — C44712 Basal cell carcinoma of skin of right lower limb, including hip: Secondary | ICD-10-CM | POA: Diagnosis not present

## 2019-05-24 DIAGNOSIS — L814 Other melanin hyperpigmentation: Secondary | ICD-10-CM | POA: Diagnosis not present

## 2019-05-24 DIAGNOSIS — L821 Other seborrheic keratosis: Secondary | ICD-10-CM | POA: Diagnosis not present

## 2019-05-25 DIAGNOSIS — H43813 Vitreous degeneration, bilateral: Secondary | ICD-10-CM | POA: Diagnosis not present

## 2019-05-25 DIAGNOSIS — H10233 Serous conjunctivitis, except viral, bilateral: Secondary | ICD-10-CM | POA: Diagnosis not present

## 2019-05-25 DIAGNOSIS — Z961 Presence of intraocular lens: Secondary | ICD-10-CM | POA: Diagnosis not present

## 2019-06-07 DIAGNOSIS — D0439 Carcinoma in situ of skin of other parts of face: Secondary | ICD-10-CM | POA: Diagnosis not present

## 2019-06-07 DIAGNOSIS — D044 Carcinoma in situ of skin of scalp and neck: Secondary | ICD-10-CM | POA: Diagnosis not present

## 2019-06-21 ENCOUNTER — Other Ambulatory Visit: Payer: Self-pay | Admitting: Family Medicine

## 2019-06-21 DIAGNOSIS — D0439 Carcinoma in situ of skin of other parts of face: Secondary | ICD-10-CM | POA: Diagnosis not present

## 2019-06-21 DIAGNOSIS — E7849 Other hyperlipidemia: Secondary | ICD-10-CM

## 2019-06-21 DIAGNOSIS — D044 Carcinoma in situ of skin of scalp and neck: Secondary | ICD-10-CM | POA: Diagnosis not present

## 2019-06-24 ENCOUNTER — Telehealth: Payer: Self-pay | Admitting: Family Medicine

## 2019-06-24 DIAGNOSIS — Z961 Presence of intraocular lens: Secondary | ICD-10-CM | POA: Diagnosis not present

## 2019-06-24 DIAGNOSIS — H43813 Vitreous degeneration, bilateral: Secondary | ICD-10-CM | POA: Diagnosis not present

## 2019-06-24 NOTE — Telephone Encounter (Signed)
Left message for patient to call back and schedule Medicare Annual Wellness Visit (AWV) either virtually/audio only or in office. Whichever the patients preference is.  Last AWV 05/14/18; please schedule at anytime with [facility] Health Advisor.

## 2019-06-26 DIAGNOSIS — X32XXXA Exposure to sunlight, initial encounter: Secondary | ICD-10-CM | POA: Diagnosis not present

## 2019-06-26 DIAGNOSIS — C44712 Basal cell carcinoma of skin of right lower limb, including hip: Secondary | ICD-10-CM | POA: Diagnosis not present

## 2019-06-26 DIAGNOSIS — L570 Actinic keratosis: Secondary | ICD-10-CM | POA: Diagnosis not present

## 2019-06-26 DIAGNOSIS — Z79899 Other long term (current) drug therapy: Secondary | ICD-10-CM | POA: Diagnosis not present

## 2019-06-26 DIAGNOSIS — C44612 Basal cell carcinoma of skin of right upper limb, including shoulder: Secondary | ICD-10-CM | POA: Diagnosis not present

## 2019-08-01 DIAGNOSIS — H9122 Sudden idiopathic hearing loss, left ear: Secondary | ICD-10-CM | POA: Diagnosis not present

## 2019-08-01 DIAGNOSIS — H90A22 Sensorineural hearing loss, unilateral, left ear, with restricted hearing on the contralateral side: Secondary | ICD-10-CM | POA: Diagnosis not present

## 2019-08-22 DIAGNOSIS — H9120 Sudden idiopathic hearing loss, unspecified ear: Secondary | ICD-10-CM | POA: Diagnosis not present

## 2019-08-22 DIAGNOSIS — H903 Sensorineural hearing loss, bilateral: Secondary | ICD-10-CM | POA: Diagnosis not present

## 2019-08-28 ENCOUNTER — Telehealth: Payer: Self-pay | Admitting: Family Medicine

## 2019-08-28 NOTE — Telephone Encounter (Signed)
Has a couple of questions, requesting a call back

## 2019-08-29 NOTE — Telephone Encounter (Signed)
Called pt- went over husband's medicine from rehab center

## 2019-09-04 DIAGNOSIS — D0439 Carcinoma in situ of skin of other parts of face: Secondary | ICD-10-CM | POA: Diagnosis not present

## 2019-09-04 DIAGNOSIS — D044 Carcinoma in situ of skin of scalp and neck: Secondary | ICD-10-CM | POA: Diagnosis not present

## 2019-09-17 ENCOUNTER — Telehealth: Payer: Self-pay | Admitting: Family Medicine

## 2019-09-17 NOTE — Telephone Encounter (Signed)
Spoke to pt- sched appt for next week

## 2019-09-17 NOTE — Telephone Encounter (Signed)
Copied from [place] [id number]. Topic: General - Inquiry >> Sep 17, 2019 10:19 AM Scherrie Gerlach wrote: Reason for CRM: pt would like a call back from [place].  She states it is somewhat "personal" pertaining to her and her husband.  Pt declined to provide any other information.

## 2019-09-23 ENCOUNTER — Other Ambulatory Visit: Payer: Self-pay | Admitting: Family Medicine

## 2019-09-23 DIAGNOSIS — I10 Essential (primary) hypertension: Secondary | ICD-10-CM

## 2019-09-23 NOTE — Telephone Encounter (Signed)
Requested Prescriptions  Pending Prescriptions Disp Refills  . lisinopril (ZESTRIL) 10 MG tablet [Pharmacy Med Name: LISINOPRIL 10 MG TAB] 180 tablet 0    Sig: TAKE (1) TABLET BY MOUTH TWICE DAILY     Cardiovascular:  ACE Inhibitors Failed - 09/23/2019  9:47 AM      Failed - Cr in normal range and within 180 days    Creatinine  Date Value Ref Range Status  06/22/2012 1.19 0.60 - 1.30 mg/dL Final   Creatinine, Ser  Date Value Ref Range Status  03/21/2019 0.94 0.57 - 1.00 mg/dL Final         Failed - K in normal range and within 180 days    Potassium  Date Value Ref Range Status  03/21/2019 5.0 3.5 - 5.2 mmol/L Final  06/22/2012 4.4 3.5 - 5.1 mmol/L Final         Failed - Valid encounter within last 6 months    Recent Outpatient Visits          6 months ago [place], Deanna C, MD   1 year ago Essential hypertension   [place], Deanna C, MD   1 year ago Candidiasis of breast   [facility] Juline Patch, MD   1 year ago Acute non-recurrent maxillary sinusitis   [facility] Juline Patch, MD   1 year ago Crush injury of hand, right, initial encounter   [place], [place]      Future Appointments            Tomorrow Juline Patch, MD [facility] [place] - Patient is not pregnant      Passed - Last BP in normal range    BP Readings from Last 1 Encounters:  03/21/19 138/70          Pt. Has f/u appt. Scheduled for tomorrow, 09/24/19. Gave 90 day supply per protocol.

## 2019-09-24 ENCOUNTER — Ambulatory Visit (INDEPENDENT_AMBULATORY_CARE_PROVIDER_SITE_OTHER): Payer: Medicare PPO | Admitting: Family Medicine

## 2019-09-24 ENCOUNTER — Encounter: Payer: Self-pay | Admitting: Family Medicine

## 2019-09-24 ENCOUNTER — Other Ambulatory Visit: Payer: Self-pay

## 2019-09-24 VITALS — BP 124/80 | HR 74 | Ht 65.0 in | Wt 124.0 lb

## 2019-09-24 DIAGNOSIS — I10 Essential (primary) hypertension: Secondary | ICD-10-CM | POA: Diagnosis not present

## 2019-09-24 DIAGNOSIS — E7849 Other hyperlipidemia: Secondary | ICD-10-CM

## 2019-09-24 MED ORDER — ATORVASTATIN CALCIUM 80 MG PO TABS: ORAL_TABLET | ORAL | 1 refills | Status: DC

## 2019-09-24 MED ORDER — LISINOPRIL 10 MG PO TABS: ORAL_TABLET | ORAL | 1 refills | Status: DC

## 2019-09-24 NOTE — Progress Notes (Signed)
Date:  09/24/2019   Name:  Phyllis Ross   DOB:  May 16, 1935   MRN:  [id number]   Chief Complaint: Hypertension and Hyperlipidemia  Hypertension This is a chronic problem. The current episode started more than 1 year ago. The problem has been gradually improving since onset. The problem is controlled. Pertinent negatives include no anxiety, blurred vision, chest pain, headaches, malaise/fatigue, neck pain, orthopnea, palpitations, peripheral edema, PND, shortness of breath or sweats. There are no associated agents to hypertension. There are no known risk factors for coronary artery disease. Past treatments include ACE inhibitors. The current treatment provides moderate improvement. There are no compliance problems.  There is no history of angina, kidney disease, CAD/MI, CVA, heart failure, left ventricular hypertrophy, PVD or retinopathy. There is no history of chronic renal disease, a hypertension causing med or renovascular disease.  Hyperlipidemia This is a chronic problem. The current episode started more than 1 year ago. The problem is controlled. Recent lipid tests were reviewed and are normal. She has no history of chronic renal disease, diabetes, hypothyroidism, liver disease, obesity or nephrotic syndrome. There are no known factors aggravating her hyperlipidemia. Pertinent negatives include no chest pain, focal sensory loss, focal weakness, leg pain, myalgias or shortness of breath. Current antihyperlipidemic treatment includes statins. The current treatment provides moderate improvement of lipids. There are no compliance problems.  Risk factors for coronary artery disease include dyslipidemia, hypertension and post-menopausal.    Lab Results  Component Value Date   CREATININE 0.94 03/21/2019   BUN 18 03/21/2019   NA 143 03/21/2019   K 5.0 03/21/2019   CL 104 03/21/2019   CO2 24 03/21/2019   Lab Results  Component Value Date   CHOL 168 03/21/2019   HDL 54 03/21/2019    LDLCALC 92 03/21/2019   TRIG 126 03/21/2019   CHOLHDL 2.9 12/12/2017   No results found for: TSH No results found for: HGBA1C Lab Results  Component Value Date   WBC 11.4 (H) 08/11/2014   HGB 11.9 (L) 08/11/2014   HCT 38.5 08/11/2014   MCV 68.7 (L) 08/11/2014   PLT 253 08/11/2014   Lab Results  Component Value Date   ALT 14 07/18/2018   AST 15 07/18/2018   ALKPHOS 81 07/18/2018   BILITOT 0.7 07/18/2018     Review of Systems  Constitutional: Negative.  Negative for chills, fatigue, fever, malaise/fatigue and unexpected weight change.  HENT: Negative for congestion, ear discharge, ear pain, rhinorrhea, sinus pressure, sneezing and sore throat.   Eyes: Negative for blurred vision, photophobia, pain, discharge, redness and itching.  Respiratory: Negative for cough, shortness of breath, wheezing and stridor.   Cardiovascular: Negative for chest pain, palpitations, orthopnea and PND.  Gastrointestinal: Negative for abdominal pain, blood in stool, constipation, diarrhea, nausea and vomiting.  Endocrine: Negative for cold intolerance, heat intolerance, polydipsia, polyphagia and polyuria.  Genitourinary: Negative for dysuria, flank pain, frequency, hematuria, menstrual problem, pelvic pain, urgency, vaginal bleeding and vaginal discharge.  Musculoskeletal: Negative for arthralgias, back pain, myalgias and neck pain.  Skin: Negative for rash.  Allergic/Immunologic: Negative for environmental allergies and food allergies.  Neurological: Negative for dizziness, focal weakness, weakness, light-headedness, numbness and headaches.  Hematological: Negative for adenopathy. Does not bruise/bleed easily.  Psychiatric/Behavioral: Negative for dysphoric mood. The patient is not nervous/anxious.     Patient Active Problem List   Diagnosis Date Noted  . Atherosclerosis of abdominal aorta ([place]) 12/26/2017  . Age-related osteoporosis without current pathological fracture 06/16/2016  .  History of  ovarian cancer 05/20/2015  . 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  . Wedging of vertebra ([place]) 10/13/2014  . Polypharmacy 10/13/2014  . Encounter for general adult medical examination without abnormal findings 10/13/2014  . Anxiety 10/13/2014  . Essential hypertension 08/27/2014  . Bradycardia 08/27/2014  . Coronary artery disease of native heart with stable angina pectoris ([place]) 08/27/2014  . MI (mitral incompetence) 08/27/2014  . TI (tricuspid incompetence) 08/27/2014  . Combined fat and carbohydrate induced hyperlipemia 08/13/2014  . HTN (hypertension), malignant 08/11/2014  . Chest pain 08/11/2014  . CAD (coronary artery disease) of artery bypass graft 08/11/2014    No Known Allergies  Past Surgical History:  Procedure Laterality Date  . COLONOSCOPY  2011   normal- Dr Vira Agar  . CORONARY ARTERY BYPASS GRAFT    . VAGINAL HYSTERECTOMY     DUB    Social History   Tobacco Use  . Smoking status: Never Smoker  . Smokeless tobacco: Never Used  Vaping Use  . Vaping Use: Never used  Substance Use Topics  . Alcohol use: No  . Drug use: No     Medication list has been reviewed and updated.  Current Meds  Medication Sig  . aspirin EC 81 MG tablet Take 1 tablet (81 mg total) by mouth daily.  Marland Kitchen atorvastatin (LIPITOR) 80 MG tablet TAKE (1) TABLET BY MOUTH EVERY DAY  . lisinopril (ZESTRIL) 10 MG tablet TAKE (1) TABLET BY MOUTH TWICE DAILY  . NITROSTAT 0.4 MG SL tablet 1 TABLET UNDER TONGUE EVERY 5 MINUTES AS NEEDED FOR CHEST PAIN. MAY REPEAT UP TO 3 DOSES, IF NO RELIEF GO TO HOSP.Marland Kitchen    PHQ 2/9 Scores 09/24/2019 03/21/2019 07/18/2018 05/14/2018  PHQ - 2 Score 0 0 0 0  PHQ- 9 Score 0 0 0 -    GAD 7 : Generalized Anxiety Score 09/24/2019 03/21/2019  Nervous, Anxious, on Edge 0 0  Control/stop worrying 0 0  Worry too much - different things 0 0  Trouble relaxing 0 0  Restless 0 0  Easily annoyed or irritable 0 0  Afraid - awful might happen 0 0   Total GAD 7 Score 0 0    BP Readings from Last 3 Encounters:  09/24/19 124/80  03/21/19 138/70  11/07/18 135/68    Physical Exam Vitals and nursing note reviewed.  Constitutional:      General: She is not in acute distress.    Appearance: She is not diaphoretic.  HENT:     Head: Normocephalic and atraumatic.     Right Ear: Tympanic membrane, ear canal and external ear normal. There is no impacted cerumen.     Left Ear: Tympanic membrane, ear canal and external ear normal. There is no impacted cerumen.     Nose: Nose normal. No congestion or rhinorrhea.     Mouth/Throat:     Mouth: Mucous membranes are moist.     Pharynx: No oropharyngeal exudate or posterior oropharyngeal erythema.  Eyes:     General:        Right eye: No discharge.        Left eye: No discharge.     Conjunctiva/sclera: Conjunctivae normal.     Pupils: Pupils are equal, round, and reactive to light.  Neck:     Thyroid: No thyromegaly.     Vascular: No carotid bruit or JVD.  Cardiovascular:     Rate and Rhythm: Normal rate and regular rhythm.     Heart sounds: Normal heart sounds.  No murmur heard.  No friction rub. No gallop.   Pulmonary:     Effort: Pulmonary effort is normal.     Breath sounds: Normal breath sounds. No wheezing, rhonchi or rales.  Abdominal:     General: Bowel sounds are normal.     Palpations: Abdomen is soft. There is no mass.     Tenderness: There is no abdominal tenderness. There is no right CVA tenderness, left CVA tenderness, guarding or rebound.     Hernia: No hernia is present.  Musculoskeletal:        General: Normal range of motion.     Cervical back: Normal range of motion and neck supple. No rigidity or tenderness.  Lymphadenopathy:     Cervical: No cervical adenopathy.  Skin:    General: Skin is warm and dry.     Capillary Refill: Capillary refill takes less than 2 seconds.     Findings: No bruising or erythema.  Neurological:     General: No focal deficit present.      Mental Status: She is alert.     Deep Tendon Reflexes: Reflexes are normal and symmetric.     Wt Readings from Last 3 Encounters:  09/24/19 124 lb (56.2 kg)  03/21/19 126 lb (57.2 kg)  11/07/18 128 lb 1.6 oz (58.1 kg)    BP 124/80   Pulse 74   Ht 5\' 5"  (1.651 m)   Wt 124 lb (56.2 kg)   BMI 20.63 kg/m   Assessment and Plan: 1. Familial multiple lipoprotein-type hyperlipidemia Chronic.  Controlled.  Stable.  Continue atorvastatin 80 mg once a day. - atorvastatin (LIPITOR) 80 MG tablet; 1 tablet daily  Dispense: 90 tablet; Refill: 1  2. Essential hypertension .  Controlled.  Stable.  Blood pressure is in normal range at 124/80.  We will continue lisinopril 10 mg 1 twice a day.  Review of previous renal function panel was acceptable and will be rechecked in 6 months. - lisinopril (ZESTRIL) 10 MG tablet; TAKE (1) TABLET BY MOUTH TWICE DAILY  Dispense: 180 tablet; Refill: 1

## 2019-10-16 DIAGNOSIS — I071 Rheumatic tricuspid insufficiency: Secondary | ICD-10-CM | POA: Diagnosis not present

## 2019-10-16 DIAGNOSIS — I25810 Atherosclerosis of coronary artery bypass graft(s) without angina pectoris: Secondary | ICD-10-CM | POA: Diagnosis not present

## 2019-10-16 DIAGNOSIS — I340 Nonrheumatic mitral (valve) insufficiency: Secondary | ICD-10-CM | POA: Diagnosis not present

## 2019-10-16 DIAGNOSIS — I700 Atherosclerosis of aorta: Secondary | ICD-10-CM | POA: Diagnosis not present

## 2019-10-16 DIAGNOSIS — I10 Essential (primary) hypertension: Secondary | ICD-10-CM | POA: Diagnosis not present

## 2019-10-16 DIAGNOSIS — E782 Mixed hyperlipidemia: Secondary | ICD-10-CM | POA: Diagnosis not present

## 2019-11-06 ENCOUNTER — Inpatient Hospital Stay: Payer: Medicare PPO | Attending: Obstetrics and Gynecology | Admitting: Obstetrics and Gynecology

## 2019-11-06 ENCOUNTER — Inpatient Hospital Stay: Payer: Medicare PPO

## 2019-11-06 ENCOUNTER — Other Ambulatory Visit: Payer: Self-pay

## 2019-11-06 VITALS — BP 122/61 | HR 67 | Temp 97.3°F | Resp 16 | Wt 124.0 lb

## 2019-11-06 DIAGNOSIS — Z9221 Personal history of antineoplastic chemotherapy: Secondary | ICD-10-CM | POA: Diagnosis not present

## 2019-11-06 DIAGNOSIS — Z8543 Personal history of malignant neoplasm of ovary: Secondary | ICD-10-CM

## 2019-11-06 DIAGNOSIS — Z08 Encounter for follow-up examination after completed treatment for malignant neoplasm: Secondary | ICD-10-CM | POA: Diagnosis not present

## 2019-11-06 DIAGNOSIS — Z7982 Long term (current) use of aspirin: Secondary | ICD-10-CM | POA: Diagnosis not present

## 2019-11-06 DIAGNOSIS — Z79899 Other long term (current) drug therapy: Secondary | ICD-10-CM | POA: Diagnosis not present

## 2019-11-06 DIAGNOSIS — D044 Carcinoma in situ of skin of scalp and neck: Secondary | ICD-10-CM | POA: Diagnosis not present

## 2019-11-06 NOTE — Progress Notes (Signed)
Gynecologic Oncology Interval Visit   Chief Concern: Ovarian cancer surveillance  Subjective:  Phyllis Ross is a 84 y.o. female, diagnosed with stage IIIb ovarian adenocarcinoma s/p surgical cytoreduction in 10/2003 followed by paclitaxel and carboplatin chemotherapy completed 04/2004, who returns to clinic for continued surveillance.   CA 125  26.7 (11/07/2018)   25 (05/15/2017)  In the interim, she saw her PCP and complained of flank pain. Ultrasound of pelvis and abdomen were negative for acute finding. No mass or free fluid within midline pelvis or within either adnexal region. Non-obstructing urinary calculus observed.   She was last seen by gyn-onc 11/2018 with negative exam. Today, she says she feels well and denies specific complaints. Abdominal and flank pain in December has resolved with no other occurrences. 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      Gynecologic Oncology History Phyllis Ross is a pleasant female who has a history of stage IIIb ovarian adenocarcinoma s/p surgical cytoreduction in 10/2003 followed by paclitaxel and carboplatin chemotherapy completed 04/2004. Serial CA125 negative.  Clinically NED.   Nov 2015 BI-RADS CATEGORY  1: Negative. Per her report she also had a mammogram last year that was negative. Ordered by Dr. Ronnald Ramp. She did not have a mammogram since b/c of discomfort with the exam.   Problem List: Patient Active Problem List   Diagnosis Date Noted   Atherosclerosis of abdominal aorta ([place]) 12/26/2017   Age-related osteoporosis without current pathological fracture 06/16/2016   Encounter for follow-up surveillance of ovarian cancer 05/20/2015   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   Wedging of vertebra ([place]) 10/13/2014   Polypharmacy 10/13/2014   Encounter for general adult medical examination without abnormal findings 10/13/2014   Anxiety 10/13/2014    Essential hypertension 08/27/2014   Bradycardia 08/27/2014   Coronary artery disease of native heart with stable angina pectoris ([place]) 08/27/2014   MI (mitral incompetence) 08/27/2014   TI (tricuspid incompetence) 08/27/2014   Combined fat and carbohydrate induced hyperlipemia 08/13/2014   HTN (hypertension), malignant 08/11/2014   Chest pain 08/11/2014   CAD (coronary artery disease) of artery bypass graft 08/11/2014    Past Medical History: Past Medical History:  Diagnosis Date   Anxiety    Cancer ([place])    Hyperlipidemia    Hypertension    MI (myocardial infarction) ([place])    Osteoporosis    Rheumatic fever     Past Surgical History: Past Surgical History:  Procedure Laterality Date   COLONOSCOPY  2011   normal- Dr Vira Agar   CORONARY ARTERY BYPASS GRAFT     VAGINAL HYSTERECTOMY     DUB    Past Gynecologic History:  As per Oncology history  OB History:  OB History  No obstetric history on file.    Family History: Family History  Problem Relation Age of Onset   Heart attack Mother    Heart attack Maternal Uncle    Heart attack Maternal Uncle     Social History: Social History   Socioeconomic History   Marital status: Married    Spouse name: Not on file   Number of children: 2   Years of education: Not on file   Highest education level: Some college, no degree  Occupational History    Employer: RETIRED  Tobacco Use   Smoking status: Never Smoker   Smokeless tobacco: Never Used  Vaping Use   Vaping Use: Never used  Substance and Sexual Activity  Alcohol use: No   Drug use: No   Sexual activity: Yes  Other Topics Concern   Not on file  Social History Narrative   Not on file   Social Determinants of Health   Financial Resource Strain:    Difficulty of Paying Living Expenses:   Food Insecurity:    Worried About Charity fundraiser in the Last Year:    Arboriculturist in the Last Year:   Transportation  Needs:    Film/video editor (Medical):    Lack of Transportation (Non-Medical):   Physical Activity:    Days of Exercise per Week:    Minutes of Exercise per Session:   Stress:    Feeling of Stress :   Social Connections:    Frequency of Communication with Friends and Family:    Frequency of Social Gatherings with Friends and Family:    Attends Religious Services:    Active Member of Clubs or Organizations:    Attends Archivist Meetings:    Marital Status:   Intimate Partner Violence:    Fear of Current or Ex-Partner:    Emotionally Abused:    Physically Abused:    Sexually Abused:     Allergies: No Known Allergies  Current Medications: Current Outpatient Medications  Medication Sig Dispense Refill   ALPRAZolam (XANAX) 0.25 MG tablet Take 1 tablet (0.25 mg total) by mouth 2 (two) times daily as needed for anxiety. 30 tablet 1   aspirin EC 81 MG tablet Take 1 tablet (81 mg total) by mouth daily. 30 tablet 11   atorvastatin (LIPITOR) 80 MG tablet 1 tablet daily 90 tablet 1   lisinopril (ZESTRIL) 10 MG tablet TAKE (1) TABLET BY MOUTH TWICE DAILY 180 tablet 1   No current facility-administered medications for this visit.   Review of Systems General:  no complaints Skin: no complaints Eyes: no complaints HEENT: no complaints Breasts: no complaints Pulmonary: no complaints Cardiac: no complaints Gastrointestinal: no complaints Genitourinary/Sexual: no complaints Ob/Gyn: no complaints Musculoskeletal: no complaints Hematology: no complaints Neurologic/Psych: no complaints    Objective:  Physical Examination:  BP 122/61 (BP Location: Left Arm, Patient Position: Sitting)    Pulse 67    Temp (!) 97.3 F (36.3 C) (Tympanic)    Resp 16    Wt 124 lb (56.2 kg)    BMI 20.63 kg/m   ECOG Performance Status: 0 - Asymptomatic   GENERAL: Patient is a well appearing female in no acute distress HEENT:  Sclera clear. Anicteric NODES:  Negative  axillary, supraclavicular, inguinal lymph node survery LUNGS:  Clear to auscultation bilaterally.   HEART:  Regular rate and rhythm.  ABDOMEN:  Soft, nontender.  No hernias, incisions well healed. No masses or ascites EXTREMITIES:  No peripheral edema. Atraumatic. No cyanosis SKIN:  Clear with no obvious rashes or skin changes.  NEURO:  Nonfocal. Well oriented.  Appropriate affect.  Pelvic:  exam chaperoned by nurse practitioner; Vulva: normal appearing vulva with no masses, tenderness or lesions; Vagina: normal; Adnexa: negative for masses or nodularity; Uterus and Cervix: surgically absent; Rectal: not indicated   Lab Review Lab Results  Component Value Date   CA125 20.5 05/25/2016    Radiologic Imaging: None    Assessment:  Phyllis Ross is a 84 y.o. female diagnosed with history of stage IIIB  Ovarian adenocarcinoma s/p surgical cytoreduction 10/2003 followed by carbo-taxol chemotherapy x 6 completed 04/2004. NED since that time.   Clinically NED. Normal CA-125 at last  in-person visit. Today's CA-125 pending.    Plan:   Problem List Items Addressed This Visit      Other   Encounter for follow-up surveillance of ovarian cancer - Primary   Relevant Orders   CA 125     I recommended return to clinic in  1 year for continued surveillance and repeat CA125.    Beckey Rutter, DNP, AGNP-C [place] at [facility] [phone] (clinic)   I personally had a face to face interaction and evaluated the patient jointly with the NP, Ms. Beckey Rutter.  I have reviewed her history and available records and have performed the key portions of the physical exam including lymph node survey, abdominal exam, pelvic exam with my findings confirming those documented above by the APP.  I have discussed the case with the APP and the patient.  I agree with the above documentation, assessment and plan which was fully formulated by me.  Counseling was completed by me.   I personally saw  the patient and performed a substantive portion of this encounter in conjunction with the listed APP as documented above.  Zelena Bushong Gaetana Michaelis, MD

## 2019-11-06 NOTE — Progress Notes (Signed)
Pt in for follow up, denies any concerns today. 

## 2019-11-07 LAB — CA 125: Cancer Antigen (CA) 125: 35.4 U/mL (ref 0.0–38.1)

## 2019-11-12 ENCOUNTER — Telehealth: Payer: Self-pay | Admitting: Nurse Practitioner

## 2019-11-12 NOTE — Telephone Encounter (Signed)
Left message with results of ca-125 which was normal.

## 2019-12-18 DIAGNOSIS — L814 Other melanin hyperpigmentation: Secondary | ICD-10-CM | POA: Diagnosis not present

## 2019-12-18 DIAGNOSIS — Z7189 Other specified counseling: Secondary | ICD-10-CM | POA: Diagnosis not present

## 2019-12-18 DIAGNOSIS — L821 Other seborrheic keratosis: Secondary | ICD-10-CM | POA: Diagnosis not present

## 2019-12-18 DIAGNOSIS — L570 Actinic keratosis: Secondary | ICD-10-CM | POA: Diagnosis not present

## 2019-12-18 DIAGNOSIS — D1801 Hemangioma of skin and subcutaneous tissue: Secondary | ICD-10-CM | POA: Diagnosis not present

## 2019-12-18 DIAGNOSIS — X32XXXA Exposure to sunlight, initial encounter: Secondary | ICD-10-CM | POA: Diagnosis not present

## 2019-12-18 DIAGNOSIS — D044 Carcinoma in situ of skin of scalp and neck: Secondary | ICD-10-CM | POA: Diagnosis not present

## 2019-12-18 DIAGNOSIS — D485 Neoplasm of uncertain behavior of skin: Secondary | ICD-10-CM | POA: Diagnosis not present

## 2019-12-30 DIAGNOSIS — L821 Other seborrheic keratosis: Secondary | ICD-10-CM | POA: Diagnosis not present

## 2020-01-29 DIAGNOSIS — C4442 Squamous cell carcinoma of skin of scalp and neck: Secondary | ICD-10-CM | POA: Diagnosis not present

## 2020-01-29 DIAGNOSIS — L821 Other seborrheic keratosis: Secondary | ICD-10-CM | POA: Diagnosis not present

## 2020-01-29 DIAGNOSIS — Z79899 Other long term (current) drug therapy: Secondary | ICD-10-CM | POA: Diagnosis not present

## 2020-01-29 LAB — CBC AND DIFFERENTIAL
HCT: 32 — AB (ref 36–46)
Hemoglobin: 9.4 — AB (ref 12.0–16.0)
Platelets: 317 (ref 150–399)
WBC: 9.8

## 2020-01-29 LAB — LIPID PANEL
Cholesterol: 152 (ref 0–200)
HDL: 51 (ref 35–70)
LDL Cholesterol: 72
Triglycerides: 173 — AB (ref 40–160)

## 2020-01-29 LAB — HEPATIC FUNCTION PANEL
ALT: 10 (ref 7–35)
AST: 17 (ref 13–35)
Alkaline Phosphatase: 90 (ref 25–125)
Bilirubin, Total: 0.3

## 2020-01-29 LAB — BASIC METABOLIC PANEL
BUN: 16 (ref 4–21)
Creatinine: 0.8 (ref 0.5–1.1)
Glucose: 105
Potassium: 4.4 (ref 3.4–5.3)
Sodium: 140 (ref 137–147)

## 2020-01-29 LAB — HEMOGLOBIN A1C: Hemoglobin A1C: 5.9

## 2020-01-29 LAB — COMPREHENSIVE METABOLIC PANEL
Albumin: 3.8 (ref 3.5–5.0)
Calcium: 9 (ref 8.7–10.7)

## 2020-01-29 LAB — CBC: RBC: 5.08 (ref 3.87–5.11)

## 2020-02-03 ENCOUNTER — Ambulatory Visit (INDEPENDENT_AMBULATORY_CARE_PROVIDER_SITE_OTHER): Payer: Medicare PPO

## 2020-02-03 DIAGNOSIS — Z0000 Encounter for general adult medical examination without abnormal findings: Secondary | ICD-10-CM | POA: Diagnosis not present

## 2020-02-03 NOTE — Progress Notes (Signed)
Subjective:   Phyllis Ross is a 84 y.o. female who presents for Medicare Annual (Subsequent) preventive examination.  Virtual Visit via Telephone Note  I connected with  Phyllis Ross on 02/03/20 at  2:40 PM EDT by telephone and verified that I am speaking with the correct person using two identifiers.  Medicare Annual Wellness visit completed telephonically due to Covid-19 pandemic.   Location: Patient: home Provider: [facility]   I discussed the limitations, risks, security and privacy concerns of performing an evaluation and management service by telephone and the availability of in person appointments. The patient expressed understanding and agreed to proceed.  Unable to perform video visit due to video visit attempted and failed and/or patient does not have video capability.   Some vital signs may be absent or patient reported.   Clemetine Marker, LPN    Review of Systems     Cardiac Risk Factors include: advanced age (>66men, >34 women);hypertension;dyslipidemia     Objective:    There were no vitals filed for this visit. There is no height or weight on file to calculate BMI.  Advanced Directives 02/03/2020 11/06/2019 05/14/2018 05/31/2017 02/13/2017 05/18/2016 05/20/2015  Does Patient Have a Medical Advance Directive? Yes Yes Yes Yes Yes Yes Yes  Type of Paramedic of [place];Living will [place];Living will [place];Living will [place];Living will [place];Living will [place];Living will [place];Living will  Does patient want to make changes to medical advance directive? - - - No - Patient declined - - -  Copy of [place] in Chart? No - copy requested - Yes - validated most recent copy scanned in chart (See row information) Yes No - copy requested Yes No - copy requested    Current Medications  (verified) Outpatient Encounter Medications as of 02/03/2020  Medication Sig  . aspirin EC 81 MG tablet Take 1 tablet (81 mg total) by mouth daily.  Marland Kitchen atorvastatin (LIPITOR) 80 MG tablet 1 tablet daily  . lisinopril (ZESTRIL) 10 MG tablet TAKE (1) TABLET BY MOUTH TWICE DAILY  . [DISCONTINUED] ALPRAZolam (XANAX) 0.25 MG tablet Take 1 tablet (0.25 mg total) by mouth 2 (two) times daily as needed for anxiety.   No facility-administered encounter medications on file as of 02/03/2020.    Allergies (verified) Patient has no known allergies.   History: Past Medical History:  Diagnosis Date  . Anxiety   . Cancer ([place])   . Hyperlipidemia   . Hypertension   . MI (myocardial infarction) ([place])   . Osteoporosis   . Rheumatic fever    Past Surgical History:  Procedure Laterality Date  . COLONOSCOPY  2011   normal- Dr Vira Agar  . CORONARY ARTERY BYPASS GRAFT    . VAGINAL HYSTERECTOMY     DUB   Family History  Problem Relation Age of Onset  . Heart attack Mother   . Heart attack Maternal Uncle   . Heart attack Maternal Uncle    Social History   Socioeconomic History  . Marital status: Married    Spouse name: Not on file  . Number of children: 2  . Years of education: Not on file  . Highest education level: Some college, no degree  Occupational History    Employer: RETIRED  Tobacco Use  . Smoking status: Never Smoker  . Smokeless tobacco: Never Used  Vaping Use  . Vaping Use: Never used  Substance  and Sexual Activity  . Alcohol use: No  . Drug use: No  . Sexual activity: Yes  Other Topics Concern  . Not on file  Social History Narrative  . Not on file   Social Determinants of Health   Financial Resource Strain: Low Risk   . Difficulty of Paying Living Expenses: Not hard at all  Food Insecurity: No Food Insecurity  . Worried About Charity fundraiser in the Last Year: Never true  . Ran Out of Food in the Last Year: Never true  Transportation Needs: No Transportation  Needs  . Lack of Transportation (Medical): No  . Lack of Transportation (Non-Medical): No  Physical Activity: Sufficiently Active  . Days of Exercise per Week: 6 days  . Minutes of Exercise per Session: 40 min  Stress: No Stress Concern Present  . Feeling of Stress : Not at all  Social Connections: Socially Integrated  . Frequency of Communication with Friends and Family: More than three times a week  . Frequency of Social Gatherings with Friends and Family: More than three times a week  . Attends Religious Services: More than 4 times per year  . Active Member of Clubs or Organizations: Yes  . Attends Archivist Meetings: More than 4 times per year  . Marital Status: Married    Tobacco Counseling Counseling given: Not Answered   Clinical Intake:                          Activities of Daily Living In your present state of health, do you have any difficulty performing the following activities: 02/03/2020  Hearing? N  Comment declines hearing aids  Vision? N  Difficulty concentrating or making decisions? N  Walking or climbing stairs? N  Dressing or bathing? N  Doing errands, shopping? N  Preparing Food and eating ? N  Using the Toilet? N  In the past six months, have you accidently leaked urine? N  Do you have problems with loss of bowel control? N  Managing your Medications? N  Managing your Finances? N  Housekeeping or managing your Housekeeping? N  Some recent data might be hidden    Patient Care Team: Juline Patch, MD as PCP - General (Family Medicine) Corey Skains, MD as Consulting Physician (Cardiology)  Indicate any recent Medical Services you may have received from other than [facility] providers in the past year (date may be approximate).     Assessment:   This is a routine wellness examination for Phyllis Ross.  Hearing/Vision screen  Hearing Screening   125Hz  250Hz  500Hz  1000Hz  2000Hz  3000Hz  4000Hz  6000Hz  8000Hz   Right ear:            Left ear:           Comments: Pt denies hearing difficulty  Vision Screening Comments: Annual vision screenings done by Dr. Matilde Sprang  Dietary issues and exercise activities discussed: Current Exercise Habits: Home exercise routine, Type of exercise: walking;Other - see comments (table tennis), Time (Minutes): 40, Frequency (Times/Week): 6, Weekly Exercise (Minutes/Week): 240, Intensity: Moderate, Exercise limited by: None identified  Goals    .  Reduce portion size (pt-stated)      Pt states she will reduce portion sizes and eat more vegetables      Depression Screen PHQ 2/9 Scores 02/03/2020 09/24/2019 03/21/2019 07/18/2018 05/14/2018 12/08/2017 12/08/2017  PHQ - 2 Score 0 0 0 0 0 0 0  PHQ- 9 Score - 0 0  0 - 0 -    Fall Risk Fall Risk  02/03/2020 09/24/2019 07/18/2018 05/14/2018 02/13/2017  Falls in the past year? 0 0 0 0 No  Number falls in past yr: 0 - - 0 -  Injury with Fall? 0 - - 0 -  Risk for fall due to : No Fall Risks - - - -  Follow up Falls prevention discussed Falls evaluation completed Falls evaluation completed Falls prevention discussed -    Any stairs in or around the home? Yes  If so, are there any without handrails? Yes   - outside steps  Home free of loose throw rugs in walkways, pet beds, electrical cords, etc? Yes  Adequate lighting in your home to reduce risk of falls? Yes   ASSISTIVE DEVICES UTILIZED TO PREVENT FALLS:  Life alert? No  Use of a cane, walker or w/c? No  Grab bars in the bathroom? Yes  Shower chair or bench in shower? Yes  Elevated toilet seat or a handicapped toilet? No   TIMED UP AND GO:  Was the test performed? No . Telephonic visit   Cognitive Function:     6CIT Screen 02/03/2020 05/14/2018 02/13/2017  What Year? 0 points 0 points 0 points  What month? 0 points 0 points 0 points  What time? 0 points 0 points 0 points  Count back from 20 0 points 0 points 0 points  Months in reverse 0 points 0 points 0 points  Repeat phrase 0 points 0  points 2 points  Total Score 0 0 2    Immunizations Immunization History  Administered Date(s) Administered  . Influenza-Unspecified 01/03/2019  . PFIZER SARS-COV-2 Vaccination 05/29/2019, 06/19/2019  . Tdap 06/16/2016    TDAP status: Up to date   Flu vaccine status: due for 2021-2022 season; pt states she plans to get on Friday.    Pneumococcal vaccine status: Declined,  Education has been provided regarding the importance of this vaccine but patient still declined. Advised may receive this vaccine at local pharmacy or Health Dept. Aware to provide a copy of the vaccination record if obtained from local pharmacy or Health Dept. Verbalized acceptance and understanding.    Covid-19 vaccine status: Completed vaccines  Qualifies for Shingles Vaccine? Yes   Zostavax completed No   Shingrix Completed?: No.    Education has been provided regarding the importance of this vaccine. Patient has been advised to call insurance company to determine out of pocket expense if they have not yet received this vaccine. Advised may also receive vaccine at local pharmacy or Health Dept. Verbalized acceptance and understanding.  Screening Tests Health Maintenance  Topic Date Due  . INFLUENZA VACCINE  11/03/2019  . PNA vac Low Risk Adult (1 of 2 - PCV13) 03/20/2020 (Originally 02/12/2003)  . TETANUS/TDAP  06/17/2026  . COVID-19 Vaccine  Completed  . DEXA SCAN  Discontinued    Health Maintenance  Health Maintenance Due  Topic Date Due  . INFLUENZA VACCINE  11/03/2019    Colorectal cancer screening: No longer required.  Mammogram status: No longer required.  Bone Density status: Completed 06/11/18. Results reflect: Bone density results: OSTEOPENIA. Repeat every 2 years.  Lung Cancer Screening: (Low Dose CT Chest recommended if Age 27-80 years, 30 pack-year currently smoking OR have quit w/in 15years.) does not qualify.  Additional Screening:  Hepatitis C Screening: does not qualify;   Vision  Screening: Recommended annual ophthalmology exams for early detection of glaucoma and other disorders of the eye. Is the patient  up to date with their annual eye exam?  Yes  Who is the provider or what is the name of the office in which the patient attends annual eye exams? Dr. Matilde Sprang   Dental Screening: Recommended annual dental exams for proper oral hygiene  Community Resource Referral / Chronic Care Management: CRR required this visit?  No   CCM required this visit?  No      Plan:     I have personally reviewed and noted the following in the patient's chart:   . Medical and social history . Use of alcohol, tobacco or illicit drugs  . Current medications and supplements . Functional ability and status . Nutritional status . Physical activity . Advanced directives . List of other physicians . Hospitalizations, surgeries, and ER visits in previous 12 months . Vitals . Screenings to include cognitive, depression, and falls . Referrals and appointments  In addition, I have reviewed and discussed with patient certain preventive protocols, quality metrics, and best practice recommendations. A written personalized care plan for preventive services as well as general preventive health recommendations were provided to patient.     Clemetine Marker, LPN   [id number]   Nurse Notes: none

## 2020-02-03 NOTE — Patient Instructions (Signed)
Phyllis Ross , Thank you for taking time to come for your Medicare Wellness Visit. I appreciate your ongoing commitment to your health goals. Please review the following plan we discussed and let me know if I can assist you in the future.   Screening recommendations/referrals: Colonoscopy: no longer required Mammogram: no longer required Bone Density: done 06/11/18 Recommended yearly ophthalmology/optometry visit for glaucoma screening and checkup Recommended yearly dental visit for hygiene and checkup  Vaccinations: Influenza vaccine: due Pneumococcal vaccine: declined Tdap vaccine: done 06/16/16 Shingles vaccine: Shingrix discussed. Please contact your pharmacy for coverage information.  Covid-19: done 05/29/19 & 06/19/19  Advanced directives: Please bring a copy of your health care power of attorney and living will to the office at your convenience.  Conditions/risks identified: Keep up the great work!  Next appointment: Follow up in one year for your annual wellness visit    Preventive Care 65 Years and Older, Female Preventive care refers to lifestyle choices and visits with your health care provider that can promote health and wellness. What does preventive care include?  A yearly physical exam. This is also called an annual well check.  Dental exams once or twice a year.  Routine eye exams. Ask your health care provider how often you should have your eyes checked.  Personal lifestyle choices, including:  Daily care of your teeth and gums.  Regular physical activity.  Eating a healthy diet.  Avoiding tobacco and drug use.  Limiting alcohol use.  Practicing safe sex.  Taking low-dose aspirin every day.  Taking vitamin and mineral supplements as recommended by your health care provider. What happens during an annual well check? The services and screenings done by your health care provider during your annual well check will depend on your age, overall health, lifestyle  risk factors, and family history of disease. Counseling  Your health care provider may ask you questions about your:  Alcohol use.  Tobacco use.  Drug use.  Emotional well-being.  Home and relationship well-being.  Sexual activity.  Eating habits.  History of falls.  Memory and ability to understand (cognition).  Work and work Statistician.  Reproductive health. Screening  You may have the following tests or measurements:  Height, weight, and BMI.  Blood pressure.  Lipid and cholesterol levels. These may be checked every 5 years, or more frequently if you are over 73 years old.  Skin check.  Lung cancer screening. You may have this screening every year starting at age 86 if you have a 30-pack-year history of smoking and currently smoke or have quit within the past 15 years.  Fecal occult blood test (FOBT) of the stool. You may have this test every year starting at age 72.  Flexible sigmoidoscopy or colonoscopy. You may have a sigmoidoscopy every 5 years or a colonoscopy every 10 years starting at age 70.  Hepatitis C blood test.  Hepatitis B blood test.  Sexually transmitted disease (STD) testing.  Diabetes screening. This is done by checking your blood sugar (glucose) after you have not eaten for a while (fasting). You may have this done every 1-3 years.  Bone density scan. This is done to screen for osteoporosis. You may have this done starting at age 87.  Mammogram. This may be done every 1-2 years. Talk to your health care provider about how often you should have regular mammograms. Talk with your health care provider about your test results, treatment options, and if necessary, the need for more tests. Vaccines  Your health  care provider may recommend certain vaccines, such as:  Influenza vaccine. This is recommended every year.  Tetanus, diphtheria, and acellular pertussis (Tdap, Td) vaccine. You may need a Td booster every 10 years.  Zoster vaccine.  You may need this after age 85.  Pneumococcal 13-valent conjugate (PCV13) vaccine. One dose is recommended after age 28.  Pneumococcal polysaccharide (PPSV23) vaccine. One dose is recommended after age 76. Talk to your health care provider about which screenings and vaccines you need and how often you need them. This information is not intended to replace advice given to you by your health care provider. Make sure you discuss any questions you have with your health care provider. Document Released: 04/17/2015 Document Revised: 12/09/2015 Document Reviewed: 01/20/2015 Elsevier Interactive Patient Education  2017 [employer] Prevention in the Home Falls can cause injuries. They can happen to people of all ages. There are many things you can do to make your home safe and to help prevent falls. What can I do on the outside of my home?  Regularly fix the edges of walkways and driveways and fix any cracks.  Remove anything that might make you trip as you walk through a door, such as a raised step or threshold.  Trim any bushes or trees on the path to your home.  Use bright outdoor lighting.  Clear any walking paths of anything that might make someone trip, such as rocks or tools.  Regularly check to see if handrails are loose or broken. Make sure that both sides of any steps have handrails.  Any raised decks and porches should have guardrails on the edges.  Have any leaves, snow, or ice cleared regularly.  Use sand or salt on walking paths during winter.  Clean up any spills in your garage right away. This includes oil or grease spills. What can I do in the bathroom?  Use night lights.  Install grab bars by the toilet and in the tub and shower. Do not use towel bars as grab bars.  Use non-skid mats or decals in the tub or shower.  If you need to sit down in the shower, use a plastic, non-slip stool.  Keep the floor dry. Clean up any water that spills on the floor as soon  as it happens.  Remove soap buildup in the tub or shower regularly.  Attach bath mats securely with double-sided non-slip rug tape.  Do not have throw rugs and other things on the floor that can make you trip. What can I do in the bedroom?  Use night lights.  Make sure that you have a light by your bed that is easy to reach.  Do not use any sheets or blankets that are too big for your bed. They should not hang down onto the floor.  Have a firm chair that has side arms. You can use this for support while you get dressed.  Do not have throw rugs and other things on the floor that can make you trip. What can I do in the kitchen?  Clean up any spills right away.  Avoid walking on wet floors.  Keep items that you use a lot in easy-to-reach places.  If you need to reach something above you, use a strong step stool that has a grab bar.  Keep electrical cords out of the way.  Do not use floor polish or wax that makes floors slippery. If you must use wax, use non-skid floor wax.  Do not have  throw rugs and other things on the floor that can make you trip. What can I do with my stairs?  Do not leave any items on the stairs.  Make sure that there are handrails on both sides of the stairs and use them. Fix handrails that are broken or loose. Make sure that handrails are as long as the stairways.  Check any carpeting to make sure that it is firmly attached to the stairs. Fix any carpet that is loose or worn.  Avoid having throw rugs at the top or bottom of the stairs. If you do have throw rugs, attach them to the floor with carpet tape.  Make sure that you have a light switch at the top of the stairs and the bottom of the stairs. If you do not have them, ask someone to add them for you. What else can I do to help prevent falls?  Wear shoes that:  Do not have high heels.  Have rubber bottoms.  Are comfortable and fit you well.  Are closed at the toe. Do not wear sandals.  If  you use a stepladder:  Make sure that it is fully opened. Do not climb a closed stepladder.  Make sure that both sides of the stepladder are locked into place.  Ask someone to hold it for you, if possible.  Clearly mark and make sure that you can see:  Any grab bars or handrails.  First and last steps.  Where the edge of each step is.  Use tools that help you move around (mobility aids) if they are needed. These include:  Canes.  Walkers.  Scooters.  Crutches.  Turn on the lights when you go into a dark area. Replace any light bulbs as soon as they burn out.  Set up your furniture so you have a clear path. Avoid moving your furniture around.  If any of your floors are uneven, fix them.  If there are any pets around you, be aware of where they are.  Review your medicines with your doctor. Some medicines can make you feel dizzy. This can increase your chance of falling. Ask your doctor what other things that you can do to help prevent falls. This information is not intended to replace advice given to you by your health care provider. Make sure you discuss any questions you have with your health care provider. Document Released: 01/15/2009 Document Revised: 08/27/2015 Document Reviewed: 04/25/2014 Elsevier Interactive Patient Education  2017 [employer].

## 2020-02-11 ENCOUNTER — Telehealth: Payer: Self-pay

## 2020-02-11 NOTE — Telephone Encounter (Signed)
Sent over under wrong chart name

## 2020-02-20 ENCOUNTER — Telehealth: Payer: Self-pay

## 2020-02-20 NOTE — Telephone Encounter (Signed)
I guess be on the lookout for the dermatology note she is speaking of, once it comes in let me know

## 2020-02-20 NOTE — Telephone Encounter (Signed)
Copied from [place] [phone]. Topic: General - Other >> Feb 20, 2020 11:33 AM Celene Kras wrote: Reason for CRM: Pt called stating that there is supposed to be some info coming in from the dermatologist. She states that she would like to speak with Baxter Flattery, or PCP regarding it asap. Please advise.

## 2020-02-24 ENCOUNTER — Other Ambulatory Visit: Payer: Self-pay

## 2020-02-24 ENCOUNTER — Encounter: Payer: Self-pay | Admitting: Family Medicine

## 2020-02-24 ENCOUNTER — Ambulatory Visit (INDEPENDENT_AMBULATORY_CARE_PROVIDER_SITE_OTHER): Payer: Medicare PPO | Admitting: Family Medicine

## 2020-02-24 VITALS — BP 130/62 | HR 80 | Ht 65.0 in | Wt 123.0 lb

## 2020-02-24 DIAGNOSIS — D509 Iron deficiency anemia, unspecified: Secondary | ICD-10-CM

## 2020-02-24 DIAGNOSIS — R7303 Prediabetes: Secondary | ICD-10-CM | POA: Diagnosis not present

## 2020-02-24 DIAGNOSIS — E7849 Other hyperlipidemia: Secondary | ICD-10-CM

## 2020-02-24 NOTE — Patient Instructions (Addendum)
GUIDELINES FOR  LOW-CHOLESTEROL, LOW-TRIGLYCERIDE DIETS    FOODS TO USE   MEATS, FISH Choose lean meats (chicken, turkey, veal, and non-fatty cuts of beef with excess fat trimmed; one serving = 3 oz of cooked meat). Also, fresh or frozen fish, canned fish packed in water, and shellfish (lobster, crabs, shrimp, and oysters). Limit use to no more than one serving of one of these per week. Shellfish are high in cholesterol but low in saturated fat and should be used sparingly. Meats and fish should be broiled (pan or oven) or baked on a rack.  EGGS Egg substitutes and egg whites (use freely). Egg yolks (limit two per week).  FRUITS Eat three servings of fresh fruit per day (1 serving =  cup). Be sure to have at least one citrus fruit daily. Frozen and canned fruit with no sugar or syrup added may be used.  VEGETABLES Most vegetables are not limited (see next page). One dark-green (string beans, escarole) or one deep yellow (squash) vegetable is recommended daily. Cauliflower, broccoli, and celery, as well as potato skins, are recommended for their fiber content. (Fiber is associated with cholesterol reduction) It is preferable to steam vegetables, but they may be boiled, strained, or braised with polyunsaturated vegetable oil (see below).  BEANS Dried peas or beans (1 serving =  cup) may be used as a bread substitute.  NUTS Almonds, walnuts, and peanuts may be used sparingly  (1 serving = 1 Tablespoonful). Use pumpkin, sesame, or sunflower seeds.  BREADS, GRAINS One roll or one slice of whole grain or enriched bread may be used, or three soda crackers or four pieces of melba toast as a substitute. Spaghetti, rice or noodles ( cup) or  large ear of corn may be used as a bread substitute. In preparing these foods do not use butter or shortening, use soft margarine. Also use egg and sugar substitutes.  Choose high fiber grains, such as oats and whole wheat.  CEREALS Use  cup of hot cereal or  cup of  cold cereal per day. Add a sugar substitute if desired, with 99% fat free or skim milk.  MILK PRODUCTS Always use 99% fat free or skim milk, dairy products such as low fat cheeses (farmer's uncreamed diet cottage), low-fat yogurt, and powdered skim milk.  FATS, OILS Use soft (not stick) margarine; vegetable oils that are high in polyunsaturated fats (such as safflower, sunflower, soybean, corn, and cottonseed). Always refrigerate meat drippings to harden the fat and remove it before preparing gravies  DESSERTS, SNACKS Limit to two servings per day; substitute each serving for a bread/cereal serving: ice milk, water sherbet (1/4 cup); unflavored gelatin or gelatin flavored with sugar substitute (1/3 cup); pudding prepared with skim milk (1/2 cup); egg white souffls; unbuttered popcorn (1  cups). Substitute carob for chocolate.  BEVERAGES Fresh fruit juices (limit 4 oz per day); black coffee, plain or herbal teas; soft drinks with sugar substitutes; club soda, preferably salt-free; cocoa made with skim milk or nonfat dried milk and water (sugar substitute added if desired); clear broth. Alcohol: limit two servings per day (see second page).  MISCELLANEOUS  You may use the following freely: vinegar, spices, herbs, nonfat bouillon, mustard, Worcestershire sauce, soy sauce, flavoring essence.                  GUIDELINES FOR  LOW-CHOLESTEROL, LOW TRIGLYCERIDE DIETS    FOODS TO AVOID   MEATS, FISH Marbled beef, pork, bacon, sausage, and other pork products; fatty   fowl (duck, goose); skin and fat of turkey and chicken; processed meats; luncheon meats (salami, bologna); frankfurters and fast-food hamburgers (theyre loaded with fat); organ meats (kidneys, liver); canned fish packed in oil.  EGGS Limit egg yolks to two per week.   FRUITS Coconuts (rich in saturated fats).  VEGETABLES Avoid avocados. Starchy vegetables (potatoes, corn, lima beans, dried peas, beans) may be used only if  substitutes for a serving of bread or cereal. (Baked potato skin, however, is desirable for its fiber content.  BEANS Commercial baked beans with sugar and/or pork added.  NUTS Avoid nuts.  Limit peanuts and walnuts to one tablespoonful per day.  BREADS, GRAINS Any baked goods with shortening and/or sugar. Commercial mixes with dried eggs and whole milk. Avoid sweet rolls, doughnuts, breakfast pastries (Danish), and sweetened packaged cereals (the added sugar converts readily to triglycerides).  MILK PRODUCTS Whole milk and whole-milk packaged goods; cream; ice cream; whole-milk puddings, yogurt, or cheeses; nondairy cream substitutes.  FATS, OILS Butter, lard, animal fats, bacon drippings, gravies, cream sauces as well as palm and coconut oils. All these are high in saturated fats. Examine labels on cholesterol free products for hydrogenated fats. (These are oils that have been hardened into solids and in the process have become saturated.)  DESSERTS, SNACKS Fried snack foods like potato chips; chocolate; candies in general; jams, jellies, syrups; whole- milk puddings; ice cream and milk sherbets; hydrogenated peanut butter.  BEVERAGES Sugared fruit juices and soft drinks; cocoa made with whole milk and/or sugar. When using alcohol (1 oz liquor, 5 oz beer, or 2  oz dry table wine per serving), one serving must be substituted for one bread or cereal serving (limit, two servings of alcohol per day).   SPECIAL NOTES    1. Remember that even non-limited foods should be used in moderation. 2. While on a cholesterol-lowering diet, be sure to avoid animal fats and marbled meats. 3. 3. While on a triglyceride-lowering diet, be sure to avoid sweets and to control the amount of carbohydrates you eat (starchy foods such as flour, bread, potatoes).While on a tri-glyceride-lowering diet, be sure to avoid sweets 4. Buy a good low-fat cookbook, such as the one published by the American Heart Association. 5. Consult  your physician if you have any questions.               [facility] Low Glycemic Diet Plan   Low Glycemic Foods (20-49) Moderate Glycemic Foods (50-69) High Glycemic Foods (70-100)      Breakfast Creals Breakfast Cereals Breakfast Cereals  All Bran All-Bran Fruit'n Oats   Bran Buds Bran Chex   Cheerios Corn chex    Fiber One Oatmeal (not instant)   Just Right Mini-Wheats   Corn Flakes Cream of Wheat    Oat Bran Special K Swiss Muesli   Grape Nuts Grape Nut Flakes      Grits Nutri-Grain    Fruits and fruit juice: Fruits Puffed Rice Puffed Wheat    (Limit to 1-2 Servings per day) Banana (under-ride) Dates   Rice Chex Rice Krispies    Apples Apricots (fresh/dried)   Figs Grapes   Shredded Wheat Team    Blackberries Blueberries   Kiwi Mango   Total     Cherries Cranberries   Oranges Raisins     Peaches Pears    Fruits  Plums Prunes   Fruit Juices Pineapple Watermelon    Grapefruit Raspberries   Cranberry Juice Orange Juice   Banana (over-ripe)       Strawberries Tangerines      Apple Juice Grapefruit Juice   Beans and Legumes Beverages  Tomato Juice    [place]-type baked beans Sodas, sweet tea, pineapple juice   Canned pinto, kidney, or navy beans   Beans and Legumes (fresh-cooked) Green peas Vegetables  Black-eyed peas Butter Beans    Potato, baked, boiled, fried, mashed  Chick peas Lentils   Vegetables [place] fries  Green beans Lima beans   Beets Carrots   Canned or frozen corn  Kidney beans Navy beans   Sweet potato Yam   Parsnips  Pinto beans Snow peas   Corn on the cob Winter squash      Non-starchy vegetables Grains Breads  Asparagus, avocado, broccoli, cabbage Cornmeal Rice, brown   Most breads (white and whole grain)  cauliflower, celery, cucumber, greens Rice, white Couscous   Bagels Bread sticks    lettuce, mushrooms, peppers, tomatoes  Bread stuffing Kaiser roll    okra, onions, spinach, summer squash Pasta Dinner rolls    [employer], cheese     Grains Ravioli, meat filled Spaghetti, white   Grains  Barley Bulgur    Rice, instant Tapioca, with milk    Rye Wild rice   Nuts    Cashews Macadamia   Candy and most cookies  Nuts and oils    Almonds, peanuts, sunflower seeds Snacks Snacks  hazelnuts, pecans, walnuts Chocolate Ice cream, lowfat   Donuts Corn chips    Oils that are liquid at room temperature Muffin Popcorn   Jelly beans Pretzels      Pastries  Dairy, fish, meat, soy, and eggs    Milk, skim Lowfat cheese    Restaurant and ethnic foods  Yogurt, lowfat, fruit sugar sweetened  Most [place] food (sugar in stir fry    or wok sauce)  Lean red meat Fish    Teriyaki-style meats and vegetables  Skinless chicken and [place], shellfish        Egg whites (up to 3 daily), Soy Products    Egg yolks (up to 7 or _____ per week)      Diabetes Mellitus and Nutrition, Adult When you have diabetes (diabetes mellitus), it is very important to have healthy eating habits because your blood sugar (glucose) levels are greatly affected by what you eat and drink. Eating healthy foods in the appropriate amounts, at about the same times every day, can help you:  Control your blood glucose.  Lower your risk of heart disease.  Improve your blood pressure.  Reach or maintain a healthy weight. Every person with diabetes is different, and each person has different needs for a meal plan. Your health care provider may recommend that you work with a diet and nutrition specialist (dietitian) to make a meal plan that is best for you. Your meal plan may vary depending on factors such as:  The calories you need.  The medicines you take.  Your weight.  Your blood glucose, blood pressure, and cholesterol levels.  Your activity level.  Other health conditions you have, such as heart or kidney disease. How do carbohydrates affect me? Carbohydrates, also called carbs, affect your blood glucose level more than  any other type of food. Eating carbs naturally raises the amount of glucose in your blood. Carb counting is a method for keeping track of how many carbs you eat. Counting carbs is important to keep your blood glucose at a healthy level, especially if you use insulin or take certain oral diabetes medicines. It  is important to know how many carbs you can safely have in each meal. This is different for every person. Your dietitian can help you calculate how many carbs you should have at each meal and for each snack. Foods that contain carbs include:  Bread, cereal, rice, pasta, and crackers.  Potatoes and corn.  Peas, beans, and lentils.  Milk and yogurt.  Fruit and juice.  Desserts, such as cakes, cookies, ice cream, and candy. How does alcohol affect me? Alcohol can cause a sudden decrease in blood glucose (hypoglycemia), especially if you use insulin or take certain oral diabetes medicines. Hypoglycemia can be a life-threatening condition. Symptoms of hypoglycemia (sleepiness, dizziness, and confusion) are similar to symptoms of having too much alcohol. If your health care provider says that alcohol is safe for you, follow these guidelines:  Limit alcohol intake to no more than 1 drink per day for nonpregnant women and 2 drinks per day for men. One drink equals 12 oz of beer, 5 oz of wine, or 1 oz of hard liquor.  Do not drink on an empty stomach.  Keep yourself hydrated with water, diet soda, or unsweetened iced tea.  Keep in mind that regular soda, juice, and other mixers may contain a lot of sugar and must be counted as carbs. What are tips for following this plan?  Reading food labels  Start by checking the serving size on the "Nutrition Facts" label of packaged foods and drinks. The amount of calories, carbs, fats, and other nutrients listed on the label is based on one serving of the item. Many items contain more than one serving per package.  Check the total grams (g) of carbs  in one serving. You can calculate the number of servings of carbs in one serving by dividing the total carbs by 15. For example, if a food has 30 g of total carbs, it would be equal to 2 servings of carbs.  Check the number of grams (g) of saturated and trans fats in one serving. Choose foods that have low or no amount of these fats.  Check the number of milligrams (mg) of salt (sodium) in one serving. Most people should limit total sodium intake to less than 2,300 mg per day.  Always check the nutrition information of foods labeled as "low-fat" or "nonfat". These foods may be higher in added sugar or refined carbs and should be avoided.  Talk to your dietitian to identify your daily goals for nutrients listed on the label. Shopping  Avoid buying canned, premade, or processed foods. These foods tend to be high in fat, sodium, and added sugar.  Shop around the outside edge of the grocery store. This includes fresh fruits and vegetables, bulk grains, fresh meats, and fresh dairy. Cooking  Use low-heat cooking methods, such as baking, instead of high-heat cooking methods like deep frying.  Cook using healthy oils, such as olive, canola, or sunflower oil.  Avoid cooking with butter, cream, or high-fat meats. Meal planning  Eat meals and snacks regularly, preferably at the same times every day. Avoid going long periods of time without eating.  Eat foods high in fiber, such as fresh fruits, vegetables, beans, and whole grains. Talk to your dietitian about how many servings of carbs you can eat at each meal.  Eat 4-6 ounces (oz) of lean protein each day, such as lean meat, chicken, fish, eggs, or tofu. One oz of lean protein is equal to: ? 1 oz of meat, chicken, or fish. ?  1 egg. ?  cup of tofu.  Eat some foods each day that contain healthy fats, such as avocado, nuts, seeds, and fish. Lifestyle  Check your blood glucose regularly.  Exercise regularly as told by your health care  provider. This may include: ? 150 minutes of moderate-intensity or vigorous-intensity exercise each week. This could be brisk walking, biking, or water aerobics. ? Stretching and doing strength exercises, such as yoga or weightlifting, at least 2 times a week.  Take medicines as told by your health care provider.  Do not use any products that contain nicotine or tobacco, such as cigarettes and e-cigarettes. If you need help quitting, ask your health care provider.  Work with a Social worker or diabetes educator to identify strategies to manage stress and any emotional and social challenges. Questions to ask a health care provider  Do I need to meet with a diabetes educator?  Do I need to meet with a dietitian?  What number can I call if I have questions?  When are the best times to check my blood glucose? Where to find more information:  American Diabetes Association: diabetes.org  Academy of Nutrition and Dietetics: www.eatright.[employer] of Diabetes and Digestive and Kidney Diseases (NIH): [employer] Summary  A healthy meal plan will help you control your blood glucose and maintain a healthy lifestyle.  Working with a diet and nutrition specialist (dietitian) can help you make a meal plan that is best for you.  Keep in mind that carbohydrates (carbs) and alcohol have immediate effects on your blood glucose levels. It is important to count carbs and to use alcohol carefully. This information is not intended to replace advice given to you by your health care provider. Make sure you discuss any questions you have with your health care provider. Document Revised: 03/03/2017 Document Reviewed: 04/25/2016 Elsevier Patient Education  2020 [employer].

## 2020-02-24 NOTE — Progress Notes (Signed)
Date:  02/24/2020   Name:  Phyllis Ross   DOB:  04/22/1935   MRN:  [id number]   Chief Complaint: consultation labs (go over lab results from [facility])  Anemia Presents for initial visit. There has been no abdominal pain, bruising/bleeding easily, fever, malaise/fatigue, palpitations, paresthesias or weight loss. Signs of blood loss that are not present include hematemesis, hematochezia, melena, menorrhagia and vaginal bleeding. There is no history of cancer or hypothyroidism.  Diabetes Pertinent negatives for hypoglycemia include no dizziness, headaches or nervousness/anxiousness. Pertinent negatives for diabetes include no chest pain, no polydipsia and no weight loss.  Hyperlipidemia She has no history of hypothyroidism. Pertinent negatives include no chest pain, myalgias or shortness of breath.    Lab Results  Component Value Date   CREATININE 0.94 03/21/2019   BUN 18 03/21/2019   NA 143 03/21/2019   K 5.0 03/21/2019   CL 104 03/21/2019   CO2 24 03/21/2019   Lab Results  Component Value Date   CHOL 168 03/21/2019   HDL 54 03/21/2019   LDLCALC 92 03/21/2019   TRIG 126 03/21/2019   CHOLHDL 2.9 12/12/2017   No results found for: TSH No results found for: HGBA1C Lab Results  Component Value Date   WBC 11.4 (H) 08/11/2014   HGB 11.9 (L) 08/11/2014   HCT 38.5 08/11/2014   MCV 68.7 (L) 08/11/2014   PLT 253 08/11/2014   Lab Results  Component Value Date   ALT 14 07/18/2018   AST 15 07/18/2018   ALKPHOS 81 07/18/2018   BILITOT 0.7 07/18/2018     Review of Systems  Constitutional: Negative for chills, fever, malaise/fatigue and weight loss.  HENT: Negative for drooling, ear discharge, ear pain and sore throat.   Respiratory: Negative for cough, shortness of breath and wheezing.   Cardiovascular: Negative for chest pain, palpitations and leg swelling.  Gastrointestinal: Negative for abdominal pain, blood in stool, constipation, diarrhea, hematemesis, hematochezia,  melena and nausea.  Endocrine: Negative for polydipsia.  Genitourinary: Negative for dysuria, frequency, hematuria, menorrhagia, urgency and vaginal bleeding.  Musculoskeletal: Negative for back pain, myalgias and neck pain.  Skin: Negative for rash.  Allergic/Immunologic: Negative for environmental allergies.  Neurological: Negative for dizziness, headaches and paresthesias.  Hematological: Does not bruise/bleed easily.  Psychiatric/Behavioral: Negative for suicidal ideas. The patient is not nervous/anxious.     Patient Active Problem List   Diagnosis Date Noted   Personal history of other malignant neoplasm of skin 01/21/2019   Atherosclerosis of abdominal aorta ([place]) 12/26/2017   Age-related osteoporosis without current pathological fracture 06/16/2016   Encounter for follow-up surveillance of ovarian cancer 05/20/2015   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   Wedging of vertebra ([place]) 10/13/2014   Polypharmacy 10/13/2014   Encounter for general adult medical examination without abnormal findings 10/13/2014   Anxiety 10/13/2014   Essential hypertension 08/27/2014   Bradycardia 08/27/2014   Coronary artery disease of native heart with stable angina pectoris ([place]) 08/27/2014   MI (mitral incompetence) 08/27/2014   TI (tricuspid incompetence) 08/27/2014   Combined fat and carbohydrate induced hyperlipemia 08/13/2014   HTN (hypertension), malignant 08/11/2014   Chest pain 08/11/2014   CAD (coronary artery disease) of artery bypass graft 08/11/2014    No Known Allergies  Past Surgical History:  Procedure Laterality Date   COLONOSCOPY  2011   normal- Dr Vira Agar   CORONARY ARTERY BYPASS GRAFT     VAGINAL HYSTERECTOMY     DUB    Social History  Tobacco Use   Smoking status: Never Smoker   Smokeless tobacco: Never Used  Vaping Use   Vaping Use: Never used  Substance Use Topics   Alcohol use: No   Drug use: No      Medication list has been reviewed and updated.  Current Meds  Medication Sig   aspirin EC 81 MG tablet Take 1 tablet (81 mg total) by mouth daily.   atorvastatin (LIPITOR) 80 MG tablet 1 tablet daily   lisinopril (ZESTRIL) 10 MG tablet TAKE (1) TABLET BY MOUTH TWICE DAILY    PHQ 2/9 Scores 02/03/2020 09/24/2019 03/21/2019 07/18/2018  PHQ - 2 Score 0 0 0 0  PHQ- 9 Score - 0 0 0    GAD 7 : Generalized Anxiety Score 09/24/2019 03/21/2019  Nervous, Anxious, on Edge 0 0  Control/stop worrying 0 0  Worry too much - different things 0 0  Trouble relaxing 0 0  Restless 0 0  Easily annoyed or irritable 0 0  Afraid - awful might happen 0 0  Total GAD 7 Score 0 0    BP Readings from Last 3 Encounters:  02/24/20 130/62  11/06/19 122/61  09/24/19 124/80    Physical Exam Vitals and nursing note reviewed.  Constitutional:      General: She is not in acute distress.    Appearance: She is not diaphoretic.  HENT:     Head: Normocephalic and atraumatic.     Right Ear: External ear normal.     Left Ear: External ear normal.     Nose: Nose normal.  Eyes:     General:        Right eye: No discharge.        Left eye: No discharge.     Conjunctiva/sclera: Conjunctivae normal.     Pupils: Pupils are equal, round, and reactive to light.  Neck:     Thyroid: No thyromegaly.     Vascular: No JVD.  Cardiovascular:     Rate and Rhythm: Normal rate and regular rhythm.     Heart sounds: Normal heart sounds. No murmur heard.  No friction rub. No gallop.   Pulmonary:     Effort: Pulmonary effort is normal.     Breath sounds: Normal breath sounds.  Abdominal:     General: Bowel sounds are normal.     Palpations: Abdomen is soft. There is no mass.     Tenderness: There is no abdominal tenderness. There is no guarding.  Musculoskeletal:        General: Normal range of motion.     Cervical back: Normal range of motion and neck supple.  Lymphadenopathy:     Cervical: No cervical  adenopathy.  Skin:    General: Skin is warm and dry.  Neurological:     Mental Status: She is alert.     Deep Tendon Reflexes: Reflexes are normal and symmetric.     Wt Readings from Last 3 Encounters:  02/24/20 123 lb (55.8 kg)  11/06/19 124 lb (56.2 kg)  09/24/19 124 lb (56.2 kg)    BP 130/62    Pulse 80    Ht 5\' 5"  (1.651 m)    Wt 123 lb (55.8 kg)    BMI 20.47 kg/m   Assessment and Plan: 1. Microcytic anemia Patient is here to review lab work for dermatology consult for initiating new medication.  At which time it was noted that patient is anemic.  This appears to be microcytic so we will recheck  patient with ferritin and 6 weeks.  2. Prediabetes Chronic.  Patient has a very minimally elevated A1c consistent with prediabetes.  Patient has been given dietary information on diabetes for avoidance of concentrated sweets and awareness of carbohydrate intake.  3. Familial multiple lipoprotein-type hyperlipidemia Chronic.  Controlled.  Stable.  Patient has been given information on low triglycerides and has been instructed to return in 6 weeks at which time we will do a lipid panel.  I spent 32 minutes with this patient, More than 50% of that time was spent in face to face education, counseling and care coordination.  During this time-cared for the patient reviewing labs, reviewing records from another facility, seeing the patient, documenting in the record and arranging for labs to be done.

## 2020-02-25 DIAGNOSIS — H029 Unspecified disorder of eyelid: Secondary | ICD-10-CM | POA: Diagnosis not present

## 2020-03-02 ENCOUNTER — Other Ambulatory Visit (INDEPENDENT_AMBULATORY_CARE_PROVIDER_SITE_OTHER): Payer: Medicare PPO

## 2020-03-02 DIAGNOSIS — Z1211 Encounter for screening for malignant neoplasm of colon: Secondary | ICD-10-CM

## 2020-03-02 LAB — HEMOCCULT GUIAC POC 1CARD (OFFICE)
Card #2 Fecal Occult Blod, POC: POSITIVE
Card #3 Fecal Occult Blood, POC: POSITIVE
Fecal Occult Blood, POC: POSITIVE — AB

## 2020-03-03 ENCOUNTER — Telehealth: Payer: Self-pay

## 2020-03-03 NOTE — Telephone Encounter (Signed)
Please Advise. Pt dropped off stool cards 03/02/2020. All stool cards was positive.   KP

## 2020-03-09 ENCOUNTER — Other Ambulatory Visit: Payer: Self-pay

## 2020-03-09 DIAGNOSIS — R195 Other fecal abnormalities: Secondary | ICD-10-CM

## 2020-03-18 ENCOUNTER — Telehealth: Payer: Self-pay

## 2020-03-18 NOTE — Telephone Encounter (Signed)
Copied from [place] [id number]. Topic: General - Other >> Mar 18, 2020  1:45 PM Alanda Slim E wrote: Reason for CRM: Pt called to let office know that her and Rupard (Husband) received covid boosters on Sat 12.11.21 at [facility] / Pt also asked for a call from Baxter Flattery to discuss some questions she has about an appt

## 2020-04-07 ENCOUNTER — Encounter: Payer: Self-pay | Admitting: Family Medicine

## 2020-04-07 ENCOUNTER — Other Ambulatory Visit: Payer: Self-pay

## 2020-04-07 ENCOUNTER — Ambulatory Visit (INDEPENDENT_AMBULATORY_CARE_PROVIDER_SITE_OTHER): Payer: Medicare PPO | Admitting: Family Medicine

## 2020-04-07 VITALS — BP 120/62 | HR 76 | Ht 65.0 in | Wt 121.0 lb

## 2020-04-07 DIAGNOSIS — D509 Iron deficiency anemia, unspecified: Secondary | ICD-10-CM

## 2020-04-07 DIAGNOSIS — R195 Other fecal abnormalities: Secondary | ICD-10-CM | POA: Diagnosis not present

## 2020-04-07 DIAGNOSIS — R7303 Prediabetes: Secondary | ICD-10-CM

## 2020-04-07 DIAGNOSIS — E7849 Other hyperlipidemia: Secondary | ICD-10-CM

## 2020-04-07 DIAGNOSIS — I10 Essential (primary) hypertension: Secondary | ICD-10-CM | POA: Diagnosis not present

## 2020-04-07 MED ORDER — ATORVASTATIN CALCIUM 80 MG PO TABS: ORAL_TABLET | ORAL | 1 refills | Status: DC

## 2020-04-07 MED ORDER — LISINOPRIL 10 MG PO TABS: ORAL_TABLET | ORAL | 1 refills | Status: DC

## 2020-04-07 NOTE — Progress Notes (Signed)
Date:  04/07/2020   Name:  Phyllis Ross   DOB:  1935/06/04   MRN:  [id number]   Chief Complaint: Anemia (Started ferrous sulfate- recheck cbc), prediabetic (Dr Allen Norris drew A1C and it was 5.9), Hypertension, and Hyperlipidemia  Anemia Presents for follow-up visit. There has been no abdominal pain, anorexia, bruising/bleeding easily, confusion, fever, leg swelling, light-headedness, malaise/fatigue, pallor, palpitations, paresthesias, pica or weight loss. Signs of blood loss that are not present include hematemesis, hematochezia, melena and vaginal bleeding. There is no history of chronic renal disease, heart failure or hypothyroidism. There are no compliance problems.   Hypertension This is a chronic problem. The current episode started more than 1 year ago. The problem has been gradually improving since onset. The problem is controlled. Pertinent negatives include no anxiety, blurred vision, chest pain, headaches, malaise/fatigue, neck pain, orthopnea, palpitations, peripheral edema, PND, shortness of breath or sweats. There are no associated agents to hypertension. There are no known risk factors for coronary artery disease. Past treatments include ACE inhibitors. The current treatment provides moderate improvement. There are no compliance problems.  There is no history of angina, kidney disease, CAD/MI, CVA, heart failure, left ventricular hypertrophy, PVD or retinopathy. There is no history of chronic renal disease, a hypertension causing med or renovascular disease.  Hyperlipidemia This is a chronic problem. The current episode started more than 1 year ago. The problem is controlled. Recent lipid tests were reviewed and are normal. She has no history of chronic renal disease, diabetes, hypothyroidism, liver disease, obesity or nephrotic syndrome. Pertinent negatives include no chest pain, focal sensory loss, focal weakness, leg pain, myalgias or shortness of breath. She is currently on no  antihyperlipidemic treatment. The current treatment provides moderate improvement of lipids. There are no compliance problems.  There are no known risk factors for coronary artery disease.  Diabetes She presents for her follow-up diabetic visit. Diabetes type: prediabetes. Her disease course has been stable. Pertinent negatives for hypoglycemia include no confusion, dizziness, headaches, nervousness/anxiousness, pallor or sweats. Pertinent negatives for diabetes include no blurred vision, no chest pain, no fatigue, no polydipsia, no polyphagia, no polyuria, no weakness and no weight loss. Pertinent negatives for diabetic complications include no CVA, PVD or retinopathy.    Lab Results  Component Value Date   CREATININE 0.8 01/29/2020   BUN 16 01/29/2020   NA 140 01/29/2020   K 4.4 01/29/2020   CL 104 03/21/2019   CO2 24 03/21/2019   Lab Results  Component Value Date   CHOL 152 01/29/2020   HDL 51 01/29/2020   LDLCALC 72 01/29/2020   TRIG 173 (A) 01/29/2020   CHOLHDL 2.9 12/12/2017   No results found for: TSH Lab Results  Component Value Date   HGBA1C 5.9 01/29/2020   Lab Results  Component Value Date   WBC 9.8 01/29/2020   HGB 9.4 (A) 01/29/2020   HCT 32 (A) 01/29/2020   MCV 68.7 (L) 08/11/2014   PLT 317 01/29/2020   Lab Results  Component Value Date   ALT 10 01/29/2020   AST 17 01/29/2020   ALKPHOS 90 01/29/2020   BILITOT 0.7 07/18/2018     Review of Systems  Constitutional: Negative.  Negative for chills, fatigue, fever, malaise/fatigue, unexpected weight change and weight loss.  HENT: Negative for congestion, ear discharge, ear pain, rhinorrhea, sinus pressure, sneezing and sore throat.   Eyes: Negative for blurred vision, double vision, photophobia, pain, discharge, redness and itching.  Respiratory: Negative for cough, shortness of  breath, wheezing and stridor.   Cardiovascular: Negative for chest pain, palpitations, orthopnea and PND.  Gastrointestinal:  Negative for abdominal pain, anorexia, blood in stool, constipation, diarrhea, hematemesis, hematochezia, melena, nausea and vomiting.  Endocrine: Negative for cold intolerance, heat intolerance, polydipsia, polyphagia and polyuria.  Genitourinary: Negative for dysuria, flank pain, frequency, hematuria, menstrual problem, pelvic pain, urgency, vaginal bleeding and vaginal discharge.  Musculoskeletal: Negative for arthralgias, back pain, myalgias and neck pain.  Skin: Negative for pallor and rash.  Allergic/Immunologic: Negative for environmental allergies and food allergies.  Neurological: Negative for dizziness, focal weakness, weakness, light-headedness, numbness, headaches and paresthesias.  Hematological: Negative for adenopathy. Does not bruise/bleed easily.  Psychiatric/Behavioral: Negative for confusion and dysphoric mood. The patient is not nervous/anxious.     Patient Active Problem List   Diagnosis Date Noted  . Personal history of other malignant neoplasm of skin 01/21/2019  . Atherosclerosis of abdominal aorta ([place]) 12/26/2017  . Age-related osteoporosis without current pathological fracture 06/16/2016  . Encounter for follow-up surveillance of ovarian cancer 05/20/2015  . 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  . Wedging of vertebra ([place]) 10/13/2014  . Polypharmacy 10/13/2014  . Encounter for general adult medical examination without abnormal findings 10/13/2014  . Anxiety 10/13/2014  . Essential hypertension 08/27/2014  . Bradycardia 08/27/2014  . Coronary artery disease of native heart with stable angina pectoris ([place]) 08/27/2014  . MI (mitral incompetence) 08/27/2014  . TI (tricuspid incompetence) 08/27/2014  . Combined fat and carbohydrate induced hyperlipemia 08/13/2014  . HTN (hypertension), malignant 08/11/2014  . Chest pain 08/11/2014  . CAD (coronary artery disease) of artery bypass graft 08/11/2014    No Known Allergies  Past Surgical  History:  Procedure Laterality Date  . COLONOSCOPY  2011   normal- Dr Vira Agar  . CORONARY ARTERY BYPASS GRAFT    . VAGINAL HYSTERECTOMY     DUB    Social History   Tobacco Use  . Smoking status: Never Smoker  . Smokeless tobacco: Never Used  Vaping Use  . Vaping Use: Never used  Substance Use Topics  . Alcohol use: No  . Drug use: No     Medication list has been reviewed and updated.  Current Meds  Medication Sig  . aspirin EC 81 MG tablet Take 1 tablet (81 mg total) by mouth daily.  Marland Kitchen atorvastatin (LIPITOR) 80 MG tablet 1 tablet daily  . ferrous sulfate 325 (65 FE) MG tablet Take 325 mg by mouth daily with breakfast.  . lisinopril (ZESTRIL) 10 MG tablet TAKE (1) TABLET BY MOUTH TWICE DAILY    PHQ 2/9 Scores 02/03/2020 09/24/2019 03/21/2019 07/18/2018  PHQ - 2 Score 0 0 0 0  PHQ- 9 Score - 0 0 0    GAD 7 : Generalized Anxiety Score 09/24/2019 03/21/2019  Nervous, Anxious, on Edge 0 0  Control/stop worrying 0 0  Worry too much - different things 0 0  Trouble relaxing 0 0  Restless 0 0  Easily annoyed or irritable 0 0  Afraid - awful might happen 0 0  Total GAD 7 Score 0 0    BP Readings from Last 3 Encounters:  04/07/20 120/62  02/24/20 130/62  11/06/19 122/61    Physical Exam Vitals and nursing note reviewed.  Constitutional:      General: She is not in acute distress.    Appearance: She is not diaphoretic.  HENT:     Head: Normocephalic and atraumatic.     Right Ear: Tympanic membrane and external ear normal.  Left Ear: Tympanic membrane and external ear normal.     Nose: Nose normal. No congestion or rhinorrhea.     Mouth/Throat:     Mouth: Oropharynx is clear and moist. Mucous membranes are moist.  Eyes:     General:        Right eye: No discharge.        Left eye: No discharge.     Extraocular Movements: EOM normal.     Conjunctiva/sclera: Conjunctivae normal.     Pupils: Pupils are equal, round, and reactive to light.  Neck:     Thyroid:  No thyromegaly.     Vascular: No JVD.  Cardiovascular:     Rate and Rhythm: Normal rate and regular rhythm.     Pulses: Intact distal pulses.     Heart sounds: Normal heart sounds. No murmur heard. No friction rub. No gallop.   Pulmonary:     Effort: Pulmonary effort is normal.     Breath sounds: Normal breath sounds. No wheezing, rhonchi or rales.  Chest:     Chest wall: No tenderness.  Abdominal:     General: Bowel sounds are normal.     Palpations: Abdomen is soft. There is no mass.     Tenderness: There is no abdominal tenderness. There is no guarding.  Musculoskeletal:        General: No edema. Normal range of motion.     Cervical back: Normal range of motion and neck supple.  Lymphadenopathy:     Cervical: No cervical adenopathy.  Skin:    General: Skin is warm and dry.     Capillary Refill: Capillary refill takes less than 2 seconds.  Neurological:     General: No focal deficit present.     Mental Status: She is alert.     Deep Tendon Reflexes: Reflexes are normal and symmetric.     Wt Readings from Last 3 Encounters:  04/07/20 121 lb (54.9 kg)  02/24/20 123 lb (55.8 kg)  11/06/19 124 lb (56.2 kg)    BP 120/62   Pulse 76   Ht 5\' 5"  (1.651 m)   Wt 121 lb (54.9 kg)   BMI 20.14 kg/m   Assessment and Plan: 1. Essential hypertension Chronic.  Controlled.  Stable.  Blood pressure today is 120/62.  Continue lisinopril 10 mg 1 twice a day.  Will check CMP for electrolytes and GFR. - lisinopril (ZESTRIL) 10 MG tablet; TAKE (1) TABLET BY MOUTH TWICE DAILY  Dispense: 180 tablet; Refill: 1 - Comprehensive Metabolic Panel (CMET)  2. Familial multiple lipoprotein-type hyperlipidemia Chronic.  Controlled.  Stable.  Continue atorvastatin 80 mg once a day.  Patient has been out for the last 3 days but her probably minimal impact on her overall cholesterol status. - atorvastatin (LIPITOR) 80 MG tablet; 1 tablet daily  Dispense: 90 tablet; Refill: 1  3. Positive occult stool  blood test As noted previous patient has positive occult blood.  Even though in the note it says that patient is on enteric-coated aspirin patient has been taking baby aspirin either orange or cherry.  Patient has been instructed to switch back over to the enteric-coated because this may be contributing to her guaiac positive stools and hence her anemia.  Patient will be seen Dr. Allen Norris in the near future for evaluation of possible upper and lower lower GI bleed - CBC w/Diff/Platelet  4. Prediabetes Patient's had a history of elevated A1c in the prediabetic range and she is currently controlling this with  her diabetes diet.  We will check A1c for current status - HgB A1c  5. Microcytic anemia Patient with history of microcytic anemia is followed by hematology.  This is thought to be of an iron deficiency nature and may be secondary to her aspirin therapy or other concerns in the GI tract.  We will recheck CBC to see if this has declined further. - CBC w/Diff/Platelet

## 2020-04-08 LAB — CBC WITH DIFFERENTIAL/PLATELET
Basophils Absolute: 0.1 10*3/uL (ref 0.0–0.2)
Basos: 1 %
EOS (ABSOLUTE): 0.2 10*3/uL (ref 0.0–0.4)
Eos: 3 %
Hematocrit: 37.3 % (ref 34.0–46.6)
Hemoglobin: 10.9 g/dL — ABNORMAL LOW (ref 11.1–15.9)
Immature Grans (Abs): 0 10*3/uL (ref 0.0–0.1)
Immature Granulocytes: 0 %
Lymphocytes Absolute: 1.5 10*3/uL (ref 0.7–3.1)
Lymphs: 19 %
MCH: 19.5 pg — ABNORMAL LOW (ref 26.6–33.0)
MCHC: 29.2 g/dL — ABNORMAL LOW (ref 31.5–35.7)
MCV: 67 fL — ABNORMAL LOW (ref 79–97)
Monocytes Absolute: 0.6 10*3/uL (ref 0.1–0.9)
Monocytes: 7 %
Neutrophils Absolute: 5.6 10*3/uL (ref 1.4–7.0)
Neutrophils: 70 %
Platelets: 302 10*3/uL (ref 150–450)
RBC: 5.59 x10E6/uL — ABNORMAL HIGH (ref 3.77–5.28)
RDW: 20.7 % — ABNORMAL HIGH (ref 11.7–15.4)
WBC: 7.9 10*3/uL (ref 3.4–10.8)

## 2020-04-08 LAB — COMPREHENSIVE METABOLIC PANEL
ALT: 10 IU/L (ref 0–32)
AST: 15 IU/L (ref 0–40)
Albumin/Globulin Ratio: 1.4 (ref 1.2–2.2)
Albumin: 3.9 g/dL (ref 3.6–4.6)
Alkaline Phosphatase: 84 IU/L (ref 44–121)
BUN/Creatinine Ratio: 20 (ref 12–28)
BUN: 20 mg/dL (ref 8–27)
Bilirubin Total: 0.5 mg/dL (ref 0.0–1.2)
CO2: 26 mmol/L (ref 20–29)
Calcium: 9.4 mg/dL (ref 8.7–10.3)
Chloride: 104 mmol/L (ref 96–106)
Creatinine, Ser: 1.01 mg/dL — ABNORMAL HIGH (ref 0.57–1.00)
GFR calc Af Amer: 60 mL/min/{1.73_m2} (ref >59)
GFR calc non Af Amer: 52 mL/min/{1.73_m2} — ABNORMAL LOW (ref >59)
Globulin, Total: 2.7 g/dL (ref 1.5–4.5)
Glucose: 83 mg/dL (ref 65–99)
Potassium: 4.8 mmol/L (ref 3.5–5.2)
Sodium: 142 mmol/L (ref 134–144)
Total Protein: 6.6 g/dL (ref 6.0–8.5)

## 2020-04-08 LAB — HEMOGLOBIN A1C
Est. average glucose Bld gHb Est-mCnc: 111 mg/dL
Hgb A1c MFr Bld: 5.5 % (ref 4.8–5.6)

## 2020-04-29 DIAGNOSIS — L820 Inflamed seborrheic keratosis: Secondary | ICD-10-CM | POA: Diagnosis not present

## 2020-04-29 DIAGNOSIS — C4442 Squamous cell carcinoma of skin of scalp and neck: Secondary | ICD-10-CM | POA: Diagnosis not present

## 2020-04-29 DIAGNOSIS — Z08 Encounter for follow-up examination after completed treatment for malignant neoplasm: Secondary | ICD-10-CM | POA: Diagnosis not present

## 2020-05-11 ENCOUNTER — Ambulatory Visit: Payer: Medicare PPO | Admitting: Gastroenterology

## 2020-05-11 ENCOUNTER — Encounter: Payer: Self-pay | Admitting: Gastroenterology

## 2020-05-11 ENCOUNTER — Other Ambulatory Visit: Payer: Self-pay

## 2020-05-11 VITALS — BP 145/78 | HR 69 | Temp 97.3°F | Ht 65.0 in | Wt 121.2 lb

## 2020-05-11 DIAGNOSIS — K921 Melena: Secondary | ICD-10-CM | POA: Diagnosis not present

## 2020-05-11 DIAGNOSIS — D500 Iron deficiency anemia secondary to blood loss (chronic): Secondary | ICD-10-CM

## 2020-05-11 NOTE — Progress Notes (Signed)
Gastroenterology Consultation  Referring Provider:     Juline Patch, MD Primary Care Physician:  Juline Patch, MD Primary Gastroenterologist:  Dr. Allen Norris     Reason for Consultation:     Heme positive stools and anemia        HPI:   Phyllis Ross is a 85 y.o. y/o female referred for consultation & management of heme positive stools and anemia by Dr. Juline Patch, MD.  This patient comes to see me today after being seen in the past by Dr. Vira Agar.  It appears that her last colonoscopy by Dr. Vira Agar was in 2011.  The patient was now found to have heme positive stools and her most recent lab work back in October showed her to have anemia.  Her CBC showed:  Component     Latest Ref Rng & Units 01/29/2020 04/07/2020  Hemoglobin     11.1 - 15.9 g/dL 9.4 (A) 10.9 (L)  HCT     34.0 - 46.6 % 32 (A) 37.3   The patient denies any bloody stools.  She does report that she was started on iron. She also reports having black stools but that has been for only the time since she started taking iron.  The patient states that she has always had anemia and was told that was thalassemia cause her father is [place].  Past Medical History:  Diagnosis Date  . Anxiety   . Cancer ([place])   . Hyperlipidemia   . Hypertension   . MI (myocardial infarction) ([place])   . Osteoporosis   . Rheumatic fever     Past Surgical History:  Procedure Laterality Date  . COLONOSCOPY  2011   normal- Dr Vira Agar  . CORONARY ARTERY BYPASS GRAFT    . VAGINAL HYSTERECTOMY     DUB    Prior to Admission medications   Medication Sig Start Date End Date Taking? Authorizing Provider  aspirin EC 81 MG tablet Take 1 tablet (81 mg total) by mouth daily. 01/12/15   Juline Patch, MD  atorvastatin (LIPITOR) 80 MG tablet 1 tablet daily 04/07/20   Juline Patch, MD  ferrous sulfate 325 (65 FE) MG tablet Take 325 mg by mouth daily with breakfast.    [provider]  lisinopril (ZESTRIL) 10 MG tablet TAKE (1) TABLET  BY MOUTH TWICE DAILY 04/07/20   Juline Patch, MD    Family History  Problem Relation Age of Onset  . Heart attack Mother   . Heart attack Maternal Uncle   . Heart attack Maternal Uncle      Social History   Tobacco Use  . Smoking status: Never Smoker  . Smokeless tobacco: Never Used  Vaping Use  . Vaping Use: Never used  Substance Use Topics  . Alcohol use: No  . Drug use: No    Allergies as of 05/11/2020  . (No Known Allergies)    Review of Systems:    All systems reviewed and negative except where noted in HPI.   Physical Exam:  There were no vitals taken for this visit. No LMP recorded. Patient has had a hysterectomy. General:   Alert,  Well-developed, well-nourished, pleasant and cooperative in NAD Head:  Normocephalic and atraumatic. Eyes:  Sclera clear, no icterus.   Conjunctiva pink. Ears:  Normal auditory acuity. Neck:  Supple; no masses or thyromegaly. Lungs:  Respirations even and unlabored.  Clear throughout to auscultation.   No wheezes, crackles, or rhonchi. No acute  distress. Heart:  Regular rate and rhythm; no murmurs, clicks, rubs, or gallops. Abdomen:  Normal bowel sounds.  No bruits.  Soft, non-tender and non-distended without masses, hepatosplenomegaly or hernias noted.  No guarding or rebound tenderness.  Negative Carnett sign.   Rectal:  Deferred.  Pulses:  Normal pulses noted. Extremities:  No clubbing or edema.  No cyanosis. Neurologic:  Alert and oriented x3;  grossly normal neurologically. Skin:  Intact without significant lesions or rashes.  No jaundice. Lymph Nodes:  No significant cervical adenopathy. Psych:  Alert and cooperative. Normal mood and affect.  Imaging Studies: No results found.  Assessment and Plan:   Phyllis Ross is a 85 y.o. y/o female Who comes in today with a history of anemia and she states she has thalassemia.  Despite this the patient was found to have heme positive stools and it appears that her hemoglobin got  better with supplemental iron.  The patient has been given the options including a upper endoscopy and colonoscopy due to the iron deficiency anemia and response to iron in addition to heme positive stools.  The patient questions whether doing more Hemoccult cards since she has stopped the aspirin that she was taking and is now taking enteric-coated aspirin.  I told her that this will not change the plan and that the bleeding may be caused by many things including gastritis ulcers cancers and polyps.  She has also been told that the 9 things including gastritis and hemorrhoids can also cause the test to be positive.  She states that she would like to think about whether she would want to undergo an EGD and colonoscopy and she will then contact me if she decides to have it done.  The patient has been explained the plan and agrees with it.    Lucilla Lame, MD. Marval Regal    Note: This dictation was prepared with Dragon dictation along with smaller phrase technology. Any transcriptional errors that result from this process are unintentional.

## 2020-05-20 ENCOUNTER — Telehealth: Payer: Self-pay

## 2020-05-20 NOTE — Telephone Encounter (Unsigned)
Copied from [place] [id number]. Topic: General - Other >> May 20, 2020  3:22 PM Yvette Rack wrote: Reason for CRM: Pt requests to speak with Dr. Ronnald Ramp or Baxter Flattery. Pt declined to provide more details. Pt requests call back.

## 2020-05-21 ENCOUNTER — Telehealth: Payer: Self-pay

## 2020-05-21 NOTE — Telephone Encounter (Signed)
LVM for pt to return my call.

## 2020-05-21 NOTE — Telephone Encounter (Signed)
Called pt.

## 2020-05-21 NOTE — Telephone Encounter (Signed)
Patient left 2 voicemail's in [place] stating she is wanting to scheduled a colonoscopy

## 2020-05-26 ENCOUNTER — Other Ambulatory Visit: Payer: Self-pay

## 2020-05-26 DIAGNOSIS — D500 Iron deficiency anemia secondary to blood loss (chronic): Secondary | ICD-10-CM

## 2020-05-26 MED ORDER — SUTAB 1479-225-188 MG PO TABS: 376.0000 mg | ORAL_TABLET | ORAL | 0 refills | Status: DC

## 2020-05-26 NOTE — Telephone Encounter (Signed)
LVM for pt to return my call.

## 2020-05-27 NOTE — Telephone Encounter (Signed)
Returned pt's call regarding scheduling a colonoscopy. Scheduled for 06/04/20.

## 2020-05-28 ENCOUNTER — Encounter: Payer: Self-pay | Admitting: Gastroenterology

## 2020-05-29 ENCOUNTER — Telehealth: Payer: Self-pay

## 2020-05-29 NOTE — Telephone Encounter (Signed)
Copied from [place] [phone]. Topic: General - Other >> May 29, 2020  2:15 PM Pawlus, Brayton Layman A wrote: Reason for CRM: Pt requested to speak directly with Baxter Flattery. Please CB.

## 2020-05-29 NOTE — Telephone Encounter (Signed)
Called pt.

## 2020-06-02 ENCOUNTER — Other Ambulatory Visit: Payer: Self-pay

## 2020-06-02 ENCOUNTER — Other Ambulatory Visit
Admission: RE | Admit: 2020-06-02 | Discharge: 2020-06-02 | Disposition: A | Discharge status: Home / Self Care | Payer: Medicare PPO | Source: Ambulatory Visit | Attending: Gastroenterology | Admitting: Gastroenterology

## 2020-06-02 DIAGNOSIS — Z20822 Contact with and (suspected) exposure to covid-19: Secondary | ICD-10-CM | POA: Insufficient documentation

## 2020-06-02 DIAGNOSIS — Z01812 Encounter for preprocedural laboratory examination: Secondary | ICD-10-CM | POA: Insufficient documentation

## 2020-06-02 DIAGNOSIS — C180 Malignant neoplasm of cecum: Secondary | ICD-10-CM

## 2020-06-02 HISTORY — DX: Malignant neoplasm of cecum: C18.0

## 2020-06-03 LAB — SARS CORONAVIRUS 2 (TAT 6-24 HRS): SARS Coronavirus 2: NEGATIVE

## 2020-06-03 NOTE — Discharge Instructions (Signed)

## 2020-06-04 ENCOUNTER — Other Ambulatory Visit: Payer: Self-pay

## 2020-06-04 ENCOUNTER — Encounter
Admission: RE | Disposition: A | Discharge status: Home / Self Care | Payer: Self-pay | Source: Home / Self Care | Attending: Gastroenterology

## 2020-06-04 ENCOUNTER — Ambulatory Visit
Admission: RE | Admit: 2020-06-04 | Discharge: 2020-06-04 | Disposition: A | Discharge status: Home / Self Care | Payer: Medicare PPO | Attending: Gastroenterology | Admitting: Gastroenterology

## 2020-06-04 ENCOUNTER — Encounter: Payer: Self-pay | Admitting: Gastroenterology

## 2020-06-04 ENCOUNTER — Ambulatory Visit: Payer: Medicare PPO | Admitting: Anesthesiology

## 2020-06-04 DIAGNOSIS — Z79899 Other long term (current) drug therapy: Secondary | ICD-10-CM | POA: Diagnosis not present

## 2020-06-04 DIAGNOSIS — Z7982 Long term (current) use of aspirin: Secondary | ICD-10-CM | POA: Diagnosis not present

## 2020-06-04 DIAGNOSIS — K642 Third degree hemorrhoids: Secondary | ICD-10-CM | POA: Insufficient documentation

## 2020-06-04 DIAGNOSIS — K2901 Acute gastritis with bleeding: Secondary | ICD-10-CM | POA: Diagnosis not present

## 2020-06-04 DIAGNOSIS — D490 Neoplasm of unspecified behavior of digestive system: Secondary | ICD-10-CM | POA: Diagnosis not present

## 2020-06-04 DIAGNOSIS — D500 Iron deficiency anemia secondary to blood loss (chronic): Secondary | ICD-10-CM | POA: Diagnosis not present

## 2020-06-04 DIAGNOSIS — D509 Iron deficiency anemia, unspecified: Secondary | ICD-10-CM | POA: Insufficient documentation

## 2020-06-04 DIAGNOSIS — Z951 Presence of aortocoronary bypass graft: Secondary | ICD-10-CM | POA: Insufficient documentation

## 2020-06-04 DIAGNOSIS — D120 Benign neoplasm of cecum: Secondary | ICD-10-CM | POA: Diagnosis not present

## 2020-06-04 DIAGNOSIS — K2951 Unspecified chronic gastritis with bleeding: Secondary | ICD-10-CM | POA: Diagnosis not present

## 2020-06-04 HISTORY — DX: Anemia, unspecified: D64.9

## 2020-06-04 HISTORY — PX: ESOPHAGOGASTRODUODENOSCOPY (EGD) WITH PROPOFOL: SHX5813

## 2020-06-04 HISTORY — PX: COLONOSCOPY WITH PROPOFOL: SHX5780

## 2020-06-04 SURGERY — COLONOSCOPY WITH PROPOFOL
Anesthesia: General

## 2020-06-04 MED ORDER — LACTATED RINGERS IV SOLN: INTRAVENOUS | Status: DC

## 2020-06-04 MED ORDER — PROPOFOL 10 MG/ML IV BOLUS
INTRAVENOUS | Status: DC | PRN
  Administered 2020-06-04: 30 mg via INTRAVENOUS
  Administered 2020-06-04: 40 mg via INTRAVENOUS
  Administered 2020-06-04 (×2): 30 mg via INTRAVENOUS
  Administered 2020-06-04: 100 mg via INTRAVENOUS

## 2020-06-04 MED ORDER — SODIUM CHLORIDE 0.9 % IV SOLN: INTRAVENOUS | Status: DC

## 2020-06-04 MED ORDER — ACETAMINOPHEN 160 MG/5ML PO SOLN: 325.0000 mg | ORAL | Status: DC | PRN

## 2020-06-04 MED ORDER — LIDOCAINE HCL (CARDIAC) PF 100 MG/5ML IV SOSY
PREFILLED_SYRINGE | INTRAVENOUS | Status: DC | PRN
  Administered 2020-06-04: 50 mg via INTRAVENOUS

## 2020-06-04 MED ORDER — ONDANSETRON HCL 4 MG/2ML IJ SOLN: 4.0000 mg | Freq: Once | INTRAMUSCULAR | Status: DC | PRN

## 2020-06-04 MED ORDER — ACETAMINOPHEN 325 MG PO TABS: 325.0000 mg | ORAL_TABLET | ORAL | Status: DC | PRN

## 2020-06-04 MED ORDER — GLYCOPYRROLATE 0.2 MG/ML IJ SOLN
INTRAMUSCULAR | Status: DC | PRN
  Administered 2020-06-04: .1 mg via INTRAVENOUS

## 2020-06-04 MED ORDER — STERILE WATER FOR IRRIGATION IR SOLN
Status: DC | PRN
  Administered 2020-06-04: 150 mL

## 2020-06-04 SURGICAL SUPPLY — 38 items
BALLN DILATOR 10-12 8 (BALLOONS)
BALLN DILATOR 12-15 8 (BALLOONS)
BALLN DILATOR 15-18 8 (BALLOONS)
BALLN DILATOR CRE 0-12 8 (BALLOONS)
BALLN DILATOR ESOPH 8 10 CRE (MISCELLANEOUS) IMPLANT
BALLOON DILATOR 12-15 8 (BALLOONS) IMPLANT
BALLOON DILATOR 15-18 8 (BALLOONS) IMPLANT
BALLOON DILATOR CRE 0-12 8 (BALLOONS) IMPLANT
BLOCK BITE 60FR ADLT L/F GRN (MISCELLANEOUS) ×2 IMPLANT
CLIP HMST 235XBRD CATH ROT (MISCELLANEOUS) IMPLANT
CLIP RESOLUTION 360 11X235 (MISCELLANEOUS)
ELECT REM PT RETURN 9FT ADLT (ELECTROSURGICAL)
ELECTRODE REM PT RTRN 9FT ADLT (ELECTROSURGICAL) IMPLANT
FCP ESCP3.2XJMB 240X2.8X (MISCELLANEOUS)
FORCEPS BIOP RAD 4 LRG CAP 4 (CUTTING FORCEPS) ×2 IMPLANT
FORCEPS BIOP RJ4 240 W/NDL (MISCELLANEOUS)
FORCEPS ESCP3.2XJMB 240X2.8X (MISCELLANEOUS) IMPLANT
GOWN CVR UNV OPN BCK APRN NK (MISCELLANEOUS) ×2 IMPLANT
GOWN ISOL THUMB LOOP REG UNIV (MISCELLANEOUS) ×4
INJECTOR VARIJECT VIN23 (MISCELLANEOUS) IMPLANT
KIT DEFENDO VALVE AND CONN (KITS) IMPLANT
KIT PRC NS LF DISP ENDO (KITS) ×1 IMPLANT
KIT PROCEDURE OLYMPUS (KITS) ×2
MANIFOLD NEPTUNE II (INSTRUMENTS) ×2 IMPLANT
MARKER SPOT ENDO TATTOO 5ML (MISCELLANEOUS) IMPLANT
PROBE APC STR FIRE (PROBE) IMPLANT
RETRIEVER NET PLAT FOOD (MISCELLANEOUS) IMPLANT
RETRIEVER NET ROTH 2.5X230 LF (MISCELLANEOUS) IMPLANT
SNARE COLD EXACTO (MISCELLANEOUS) IMPLANT
SNARE SHORT THROW 13M SML OVAL (MISCELLANEOUS) IMPLANT
SNARE SHORT THROW 30M LRG OVAL (MISCELLANEOUS) IMPLANT
SNARE SNG USE RND 15MM (INSTRUMENTS) IMPLANT
SPOT EX ENDOSCOPIC TATTOO (MISCELLANEOUS)
SYR INFLATION 60ML (SYRINGE) IMPLANT
TRAP ETRAP POLY (MISCELLANEOUS) IMPLANT
VARIJECT INJECTOR VIN23 (MISCELLANEOUS)
WATER STERILE IRR 250ML POUR (IV SOLUTION) ×2 IMPLANT
WIRE CRE 18-20MM 8CM F G (MISCELLANEOUS) IMPLANT

## 2020-06-04 NOTE — Op Note (Signed)
[facility] Gastroenterology Patient Name: Phyllis Ross Procedure Date: 06/04/2020 9:25 AM MRN: [id number] Account #: [id number] Date of Birth: 10/24/1935 Admit Type: Outpatient Age: 85 Room: [facility] OR ROOM 01 Gender: Female Note Status: Finalized Procedure:             Colonoscopy Indications:           Iron deficiency anemia Providers:             Lucilla Lame MD, MD Medicines:             Propofol per Anesthesia Complications:         No immediate complications. Procedure:             Pre-Anesthesia Assessment:                        - Prior to the procedure, a History and Physical was                         performed, and patient medications and allergies were                         reviewed. The patient's tolerance of previous                         anesthesia was also reviewed. The risks and benefits                         of the procedure and the sedation options and risks                         were discussed with the patient. All questions were                         answered, and informed consent was obtained. Prior                         Anticoagulants: The patient has taken no previous                         anticoagulant or antiplatelet agents. ASA Grade                         Assessment: II - A patient with mild systemic disease.                         After reviewing the risks and benefits, the patient                         was deemed in satisfactory condition to undergo the                         procedure.                        After obtaining informed consent, the colonoscope was                         passed under direct vision. Throughout the procedure,  the patient's blood pressure, pulse, and oxygen                         saturations were monitored continuously. The was                         introduced through the anus and advanced to the the                         cecum, identified by appendiceal  orifice and ileocecal                         valve. The colonoscopy was performed without                         difficulty. The patient tolerated the procedure well.                         The quality of the bowel preparation was excellent. Findings:      The perianal and digital rectal examinations were normal.      A partially obstructing large mass was found in the cecum. The mass was       non-circumferential. No bleeding was present. Biopsies were taken with a       cold forceps for histology.      Non-bleeding internal hemorrhoids were found during retroflexion. The       hemorrhoids were Grade III (internal hemorrhoids that prolapse but       require manual reduction). 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 Biopsied.                        - Non-bleeding internal hemorrhoids. Recommendation:        - Discharge patient to home.                        - Resume previous diet.                        - Continue present medications.                        - Await pathology results. Procedure Code(s):     --- Professional ---                        [phone], Colonoscopy, flexible; with biopsy, single or                         multiple Diagnosis Code(s):     --- Professional ---                        D50.9, Iron deficiency anemia, unspecified                        D49.0, Neoplasm of unspecified behavior of digestive  system CPT copyright 2019 American Medical Association. All rights reserved. The codes documented in this report are preliminary and upon coder review may  be revised to meet current compliance requirements. Lucilla Lame MD, MD 06/04/2020 10:00:22 AM This report has been signed electronically. Number of Addenda: 0 Note Initiated On: 06/04/2020 9:25 AM Scope Withdrawal Time: 0 hours 7 minutes 19 seconds  Total Procedure Duration: 0 hours 10 minutes 55 seconds  Estimated Blood  Loss:  Estimated blood loss: none. Estimated blood loss: none.      Correct Care Of

## 2020-06-04 NOTE — Anesthesia Postprocedure Evaluation (Signed)
Anesthesia Post Note  Patient: Phyllis Ross  Procedure(s) Performed: COLONOSCOPY WITH PROPOFOL (N/A ) ESOPHAGOGASTRODUODENOSCOPY (EGD) WITH PROPOFOL (N/A )     Patient location during evaluation: PACU Anesthesia Type: General Level of consciousness: awake Pain management: pain level controlled Vital Signs Assessment: post-procedure vital signs reviewed and stable Respiratory status: respiratory function stable Cardiovascular status: stable Postop Assessment: no signs of nausea or vomiting Anesthetic complications: no   No complications documented.  Veda Canning

## 2020-06-04 NOTE — Anesthesia Preprocedure Evaluation (Signed)
Anesthesia Evaluation  Patient identified by MRN, date of birth, ID band Patient awake    Reviewed: Allergy & Precautions, NPO status , Patient's Chart, lab work & pertinent test results  Airway Mallampati: II  TM Distance: >3 FB     Dental   Pulmonary    breath sounds clear to auscultation       Cardiovascular hypertension, + CAD and + Past MI   Rhythm:Regular Rate:Normal  HLD   Neuro/Psych    GI/Hepatic Positive fecal occult test with anemia   Endo/Other    Renal/GU      Musculoskeletal   Abdominal   Peds  Hematology  (+) anemia ,   Anesthesia Other Findings   Reproductive/Obstetrics                             Anesthesia Physical Anesthesia Plan  ASA: III  Anesthesia Plan: General   Post-op Pain Management:    Induction: Intravenous  PONV Risk Score and Plan: Propofol infusion, TIVA and Treatment may vary due to age or medical condition  Airway Management Planned: Natural Airway and Nasal Cannula  Additional Equipment:   Intra-op Plan:   Post-operative Plan:   Informed Consent: I have reviewed the patients History and Physical, chart, labs and discussed the procedure including the risks, benefits and alternatives for the proposed anesthesia with the patient or authorized representative who has indicated his/her understanding and acceptance.       Plan Discussed with: CRNA  Anesthesia Plan Comments:         Anesthesia Quick Evaluation

## 2020-06-04 NOTE — Anesthesia Procedure Notes (Signed)
Date/Time: 06/04/2020 9:36 AM Performed by: Cameron Ali, CRNA Pre-anesthesia Checklist: Patient identified, Emergency Drugs available, Suction available, Timeout performed and Patient being monitored Patient Re-evaluated:Patient Re-evaluated prior to induction Oxygen Delivery Method: Nasal cannula Placement Confirmation: positive ETCO2

## 2020-06-04 NOTE — H&P (Signed)
Phyllis Lame, MD [address] [place], [place] [address] Phone:[phone] Fax : [phone]  Primary Care Physician:  Juline Patch, MD Primary Gastroenterologist:  Dr. Allen Norris  Pre-Procedure History & Physical: HPI:  Phyllis Ross is a 85 y.o. female is here for an endoscopy and colonoscopy.   Past Medical History:  Diagnosis Date  . Anemia   . Anxiety   . Cancer ([place])   . Hyperlipidemia   . Hypertension   . MI (myocardial infarction) ([place])   . Osteoporosis   . Rheumatic fever     Past Surgical History:  Procedure Laterality Date  . COLONOSCOPY  2011   normal- Dr Vira Agar  . CORONARY ARTERY BYPASS GRAFT    . VAGINAL HYSTERECTOMY     DUB    Prior to Admission medications   Medication Sig Start Date End Date Taking? Authorizing Provider  aspirin EC 81 MG tablet Take 1 tablet (81 mg total) by mouth daily. 01/12/15  Yes Juline Patch, MD  atorvastatin (LIPITOR) 80 MG tablet 1 tablet daily 04/07/20  Yes Juline Patch, MD  ferrous sulfate 325 (65 FE) MG tablet Take 325 mg by mouth daily with breakfast.   Yes [provider]  lisinopril (ZESTRIL) 10 MG tablet TAKE (1) TABLET BY MOUTH TWICE DAILY 04/07/20   Juline Patch, MD  Sodium Sulfate-Mag Sulfate-KCl (SUTAB) [phone] MG TABS Take 376 mg by mouth as directed. 05/26/20   Phyllis Lame, MD    Allergies as of 05/26/2020  . (No Known Allergies)    Family History  Problem Relation Age of Onset  . Heart attack Mother   . Heart attack Maternal Uncle   . Heart attack Maternal Uncle     Social History   Socioeconomic History  . Marital status: Married    Spouse name: Not on file  . Number of children: 2  . Years of education: Not on file  . Highest education level: Some college, no degree  Occupational History    Employer: RETIRED  Tobacco Use  . Smoking status: Never Smoker  . Smokeless tobacco: Never Used  Vaping Use  . Vaping Use: Never used  Substance and Sexual Activity   . Alcohol use: No  . Drug use: No  . Sexual activity: Yes  Other Topics Concern  . Not on file  Social History Narrative  . Not on file   Social Determinants of Health   Financial Resource Strain: Low Risk   . Difficulty of Paying Living Expenses: Not hard at all  Food Insecurity: No Food Insecurity  . Worried About Charity fundraiser in the Last Year: Never true  . Ran Out of Food in the Last Year: Never true  Transportation Needs: No Transportation Needs  . Lack of Transportation (Medical): No  . Lack of Transportation (Non-Medical): No  Physical Activity: Sufficiently Active  . Days of Exercise per Week: 6 days  . Minutes of Exercise per Session: 40 min  Stress: No Stress Concern Present  . Feeling of Stress : Not at all  Social Connections: Socially Integrated  . Frequency of Communication with Friends and Family: More than three times a week  . Frequency of Social Gatherings with Friends and Family: More than three times a week  . Attends Religious Services: More than 4 times per year  . Active Member of Clubs or Organizations: Yes  . Attends Archivist Meetings: More than 4 times per year  . Marital  Status: Married  Human resources officer Violence: Not At Risk  . Fear of Current or Ex-Partner: No  . Emotionally Abused: No  . Physically Abused: No  . Sexually Abused: No    Review of Systems: See HPI, otherwise negative ROS  Physical Exam: BP (!) 155/73   Pulse 72   Temp 97.8 F (36.6 C) (Temporal)   Ht 5\' 6"  (1.676 m)   Wt 54 kg   SpO2 96%   BMI 19.21 kg/m  General:   Alert,  pleasant and cooperative in NAD Head:  Normocephalic and atraumatic. Neck:  Supple; no masses or thyromegaly. Lungs:  Clear throughout to auscultation.    Heart:  Regular rate and rhythm. Abdomen:  Soft, nontender and nondistended. Normal bowel sounds, without guarding, and without rebound.   Neurologic:  Alert and  oriented x4;  grossly normal  neurologically.  Impression/Plan: Phyllis Ross is here for an endoscopy and colonoscopy to be performed for IDA  Risks, benefits, limitations, and alternatives regarding  endoscopy and colonoscopy have been reviewed with the patient.  Questions have been answered.  All parties agreeable.   Phyllis Lame, MD  06/04/2020, 8:56 AM

## 2020-06-04 NOTE — Op Note (Signed)
[facility] Patient Name: Phyllis Ross Procedure Date: 06/04/2020 9:26 AM MRN: [id number] Account #: [id number] Date of Birth: 1936-01-09 Admit Type: Outpatient Age: 85 Room: [facility] OR ROOM 01 Gender: Female Note Status: Finalized Procedure:             Upper GI endoscopy Indications:           Iron deficiency anemia Providers:             Lucilla Lame MD, MD Referring MD:          Juline Patch, MD (Referring MD) Medicines:             Propofol per Anesthesia Complications:         No immediate complications. Procedure:             Pre-Anesthesia Assessment:                        - Prior to the procedure, a History and Physical was                         performed, and patient medications and allergies were                         reviewed. The patient's tolerance of previous                         anesthesia was also reviewed. The risks and benefits                         of the procedure and the sedation options and risks                         were discussed with the patient. All questions were                         answered, and informed consent was obtained. Prior                         Anticoagulants: The patient has taken no previous                         anticoagulant or antiplatelet agents. ASA Grade                         Assessment: II - A patient with mild systemic disease.                         After reviewing the risks and benefits, the patient                         was deemed in satisfactory condition to undergo the                         procedure.                        After obtaining informed consent, the endoscope was  passed under direct vision. Throughout the procedure,                         the patient's blood pressure, pulse, and oxygen                         saturations were monitored continuously. The                         Endosonoscope was introduced through the mouth, and                          advanced to the second part of duodenum. The upper GI                         endoscopy was accomplished without difficulty. The                         patient tolerated the procedure well. Findings:      The examined esophagus was normal.      Localized severe inflammation with hemorrhage characterized by erosions       was found in the gastric antrum. Biopsies were taken with a cold forceps       for histology.      The examined duodenum was normal. Impression:            - Normal esophagus.                        - Gastritis with hemorrhage. Biopsied.                        - Normal examined duodenum. Recommendation:        - Discharge patient to home.                        - Resume previous diet.                        - Continue present medications.                        - Await pathology results.                        - Perform a colonoscopy today. Procedure Code(s):     --- Professional ---                        [id number], Esophagogastroduodenoscopy, flexible,                         transoral; with biopsy, single or multiple Diagnosis Code(s):     --- Professional ---                        D50.9, Iron deficiency anemia, unspecified                        K29.71, Gastritis, unspecified, with bleeding CPT copyright 2019 American Medical Association. All rights reserved. The codes documented in this report are preliminary and upon coder review may  be revised  to meet current compliance requirements. Lucilla Lame MD, MD 06/04/2020 9:45:19 AM This report has been signed electronically. Number of Addenda: 0 Note Initiated On: 06/04/2020 9:26 AM Total Procedure Duration: 0 hours 2 minutes 44 seconds  Estimated Blood Loss:  Estimated blood loss: none.      [facility]

## 2020-06-04 NOTE — Transfer of Care (Signed)
Immediate Anesthesia Transfer of Care Note  Patient: Phyllis Ross  Procedure(s) Performed: COLONOSCOPY WITH PROPOFOL (N/A ) ESOPHAGOGASTRODUODENOSCOPY (EGD) WITH PROPOFOL (N/A )  Patient Location: PACU  Anesthesia Type: General  Level of Consciousness: awake, alert  and patient cooperative  Airway and Oxygen Therapy: Patient Spontanous Breathing and Patient connected to supplemental oxygen  Post-op Assessment: Post-op Vital signs reviewed, Patient's Cardiovascular Status Stable, Respiratory Function Stable, Patent Airway and No signs of Nausea or vomiting  Post-op Vital Signs: Reviewed and stable  Complications: No complications documented.

## 2020-06-05 ENCOUNTER — Encounter: Payer: Self-pay | Admitting: Gastroenterology

## 2020-06-05 ENCOUNTER — Telehealth: Payer: Self-pay

## 2020-06-05 NOTE — Telephone Encounter (Signed)
Spoke to pt concerning her upcoming appt

## 2020-06-05 NOTE — Telephone Encounter (Signed)
Copied from [place] [id number]. Topic: General - Other >> Jun 05, 2020 10:08 AM Leward Quan A wrote: Reason for CRM: Patient called in asking to speak with Baxter Flattery would like a call back. States its about a Dr she does not know but no other information. Please call Ph# [phone]

## 2020-06-08 ENCOUNTER — Telehealth: Payer: Self-pay | Admitting: Family Medicine

## 2020-06-08 NOTE — Telephone Encounter (Signed)
Pt is calling to ask is is ok to resume taking natural made iron 65mg ? CB- [phone]

## 2020-06-08 NOTE — Telephone Encounter (Signed)
Called with resume med

## 2020-06-09 ENCOUNTER — Ambulatory Visit (INDEPENDENT_AMBULATORY_CARE_PROVIDER_SITE_OTHER): Payer: Medicare PPO | Admitting: Surgery

## 2020-06-09 ENCOUNTER — Telehealth: Payer: Self-pay | Admitting: Surgery

## 2020-06-09 ENCOUNTER — Ambulatory Visit: Payer: Self-pay | Admitting: Surgery

## 2020-06-09 ENCOUNTER — Other Ambulatory Visit: Payer: Self-pay

## 2020-06-09 ENCOUNTER — Other Ambulatory Visit
Admission: RE | Admit: 2020-06-09 | Discharge: 2020-06-09 | Disposition: A | Discharge status: Home / Self Care | Payer: Medicare PPO | Attending: Surgery | Admitting: Surgery

## 2020-06-09 ENCOUNTER — Encounter: Payer: Self-pay | Admitting: Surgery

## 2020-06-09 ENCOUNTER — Telehealth: Payer: Self-pay

## 2020-06-09 VITALS — BP 192/73 | HR 66 | Temp 98.0°F | Ht 65.0 in | Wt 120.4 lb

## 2020-06-09 DIAGNOSIS — D490 Neoplasm of unspecified behavior of digestive system: Secondary | ICD-10-CM | POA: Diagnosis not present

## 2020-06-09 LAB — COMPREHENSIVE METABOLIC PANEL
ALT: 12 U/L (ref 0–44)
AST: 19 U/L (ref 15–41)
Albumin: 3.8 g/dL (ref 3.5–5.0)
Alkaline Phosphatase: 74 U/L (ref 38–126)
Anion gap: 11 (ref 5–15)
BUN: 16 mg/dL (ref 8–23)
CO2: 28 mmol/L (ref 22–32)
Calcium: 9.5 mg/dL (ref 8.9–10.3)
Chloride: 102 mmol/L (ref 98–111)
Creatinine, Ser: 0.85 mg/dL (ref 0.44–1.00)
GFR, Estimated: >60 mL/min (ref >60)
Glucose, Bld: 139 mg/dL — ABNORMAL HIGH (ref 70–99)
Potassium: 4.1 mmol/L (ref 3.5–5.1)
Sodium: 141 mmol/L (ref 135–145)
Total Bilirubin: 0.7 mg/dL (ref 0.3–1.2)
Total Protein: 7.2 g/dL (ref 6.5–8.1)

## 2020-06-09 LAB — SURGICAL PATHOLOGY

## 2020-06-09 MED ORDER — ERYTHROMYCIN BASE 500 MG PO TABS: 500.0000 mg | ORAL_TABLET | ORAL | 0 refills | Status: DC | PRN

## 2020-06-09 MED ORDER — METRONIDAZOLE 500 MG PO TABS: 500.0000 mg | ORAL_TABLET | ORAL | 0 refills | Status: AC

## 2020-06-09 MED ORDER — NEOMYCIN SULFATE 500 MG PO TABS: 500.0000 mg | ORAL_TABLET | ORAL | 0 refills | Status: DC | PRN

## 2020-06-09 MED ORDER — BISACODYL 5 MG PO TBEC: 5.0000 mg | DELAYED_RELEASE_TABLET | Freq: Once | ORAL | 0 refills | Status: AC

## 2020-06-09 MED ORDER — POLYETHYLENE GLYCOL 3350 17 GM/SCOOP PO POWD: 1.0000 | Freq: Once | ORAL | 0 refills | Status: AC

## 2020-06-09 NOTE — Telephone Encounter (Signed)
Patient has been advised of Pre-Admission date/time, COVID Testing date and Surgery date.  Surgery Date: 06/15/20 Preadmission Testing Date: 06/11/20 (phone 1p-5p) Covid Testing Date: 06/12/20 - patient advised to go to the [facility] ([place]) between 8a-1p  Patient has been made aware to call [phone], between 1-3:00pm the day before surgery, to find out what time to arrive for surgery.

## 2020-06-09 NOTE — H&P (View-Only) (Signed)
Patient ID: Phyllis Ross, female   DOB: 06/21/1935, 85 y.o.   MRN: [id number]  Chief Complaint: Cecal mass  History of Present Illness Phyllis Ross is a 85 y.o. 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  She was initially screened with Hemoccult cards, had a degree of anemia.  She denies any gross blood in her stools.  She otherwise has good bowel movements.  She does report a 5 pound weight loss over the last multiple months.  She otherwise has a history of ovarian cancer, she is over a decade and a half out from that.  And a history of coronary artery disease.  She is status post triple bypass.  She remains quite active, living a rather robust lifestyle.  Past Medical History Past Medical History:  Diagnosis Date  . Anemia   . Anxiety   . Cancer ([place])   . Hyperlipidemia   . Hypertension   . MI (myocardial infarction) ([place])   . Osteoporosis   . Rheumatic fever       Past Surgical History:  Procedure Laterality Date  . COLONOSCOPY  2011   normal- Dr Vira Agar  . COLONOSCOPY WITH PROPOFOL N/A 06/04/2020   Procedure: COLONOSCOPY WITH PROPOFOL;  Surgeon: Lucilla Lame, MD;  Location: [facility];  Service: Endoscopy;  Laterality: N/A;  . CORONARY ARTERY BYPASS GRAFT    . ESOPHAGOGASTRODUODENOSCOPY (EGD) WITH PROPOFOL N/A 06/04/2020   Procedure: ESOPHAGOGASTRODUODENOSCOPY (EGD) WITH PROPOFOL;  Surgeon: Lucilla Lame, MD;  Location: [facility];  Service: Endoscopy;  Laterality: N/A;  . VAGINAL HYSTERECTOMY     DUB    No Known Allergies  Current Outpatient Medications  Medication Sig Dispense Refill  . atorvastatin (LIPITOR) 80 MG tablet 1 tablet daily 90 tablet 1  . bisacodyl (DULCOLAX) 5 MG EC tablet Take 1 tablet (5 mg total) by mouth once for 1 dose. Take 4 Dulcolax tablets at 8:00 am the day before your procedure. 4 tablet 0  . ferrous sulfate 325 (65 FE) MG tablet  Take 325 mg by mouth daily with breakfast.    . lisinopril (ZESTRIL) 10 MG tablet TAKE (1) TABLET BY MOUTH TWICE DAILY 180 tablet 1  . metroNIDAZOLE (FLAGYL) 500 MG tablet Take 1 tablet (500 mg total) by mouth See admin instructions for 1 day. Take two (2) tablets at 7 PM and two (2) tablets at 11 PM the night prior to surgery. 4 tablet 0  . neomycin (MYCIFRADIN) 500 MG tablet Take 1 tablet (500 mg total) by mouth as needed. Take 2 Neomycin tablets at 8:00 am and again at 2:00 pm  And again at 8:00pm. 6 tablet 0  . polyethylene glycol powder (GLYCOLAX/MIRALAX) 17 GM/SCOOP powder Take 255 g by mouth once for 1 dose. 255 g 0  . Sodium Sulfate-Mag Sulfate-KCl (SUTAB) [phone] MG TABS Take 376 mg by mouth as directed. 24 tablet 0  . erythromycin base (E-MYCIN) 500 MG tablet Take 1 tablet (500 mg total) by mouth as needed. Take 2 Erythromycin tablets at 8:00 am, again at 2:00 pm and again at 8:00 pm the day before your surgery. 6 tablet 0   No current facility-administered medications for this visit.    Family History Family History  Problem Relation Age of Onset  . Heart attack Mother   . Heart attack Maternal Uncle   . Heart attack Maternal Uncle       Social History Social History  Tobacco Use  . Smoking status: Never Smoker  . Smokeless tobacco: Never Used  Vaping Use  . Vaping Use: Never used  Substance Use Topics  . Alcohol use: No  . Drug use: No        Review of Systems  Constitutional: Positive for weight loss.  HENT: Negative.   Eyes: Negative.   Respiratory: Negative.   Cardiovascular: Negative.   Gastrointestinal: Positive for blood in stool (Based on Hemoccult, no gross blood noted).  Genitourinary: Negative.   Skin: Negative.   Neurological: Negative.   Psychiatric/Behavioral: Negative.       Physical Exam Blood pressure (!) 192/73, pulse 66, temperature 98 F (36.7 C), temperature source Oral, height 5\' 5"  (1.651 m), weight 120 lb 6.4 oz (54.6 kg),  SpO2 98 %. Last Weight  Most recent update: 06/09/2020 10:02 AM   Weight  54.6 kg (120 lb 6.4 oz)            CONSTITUTIONAL: Well developed, and nourished, appropriately responsive and aware without distress.   EYES: Sclera non-icteric.   EARS, NOSE, MOUTH AND THROAT: Mask worn.  Hearing is intact to voice.  NECK: Trachea is midline, and there is no jugular venous distension.  LYMPH NODES:  Lymph nodes in the neck are not enlarged. RESPIRATORY:  Lungs are clear, and breath sounds are equal bilaterally. Normal respiratory effort without pathologic use of accessory muscles. CARDIOVASCULAR: Heart is regular in rate and rhythm. GI: The abdomen is soft, nontender, and nondistended. There were no palpable masses.  There is a well-healed midline abdominal scar, with some epigastric scars likely due to mediastinal tubes.  I did not appreciate hepatosplenomegaly. MUSCULOSKELETAL:  Symmetrical muscle tone appreciated in all four extremities.    SKIN: Skin turgor is normal. No pathologic skin lesions appreciated.  NEUROLOGIC:  Motor and sensation appear grossly normal.  Cranial nerves are grossly without defect. PSYCH:  Alert and oriented to person, place and time. Affect is appropriate for situation.  Data Reviewed I have personally reviewed what is currently available of the patient's imaging, recent labs and medical records.   Labs:  CBC Latest Ref Rng & Units 04/07/2020 01/29/2020 08/11/2014  WBC 3.4 - 10.8 x10E3/uL 7.9 9.8 11.4(H)  Hemoglobin 11.1 - 15.9 g/dL 10.9(L) 9.4(A) 11.9(L)  Hematocrit 34.0 - 46.6 % 37.3 32(A) 38.5  Platelets 150 - 450 x10E3/uL 302 317 253      Imaging:  Within last 24 hrs: No results found.  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  Likely cancer based on appearance during endoscopy. Patient Active Problem List   Diagnosis Date Noted  . Iron deficiency anemia due to chronic blood loss   . Neoplasm of lower  gastrointestinal tract   . Acute gastritis with hemorrhage   . Personal history of other malignant neoplasm of skin 01/21/2019  . Atherosclerosis of abdominal aorta ([place]) 12/26/2017  . Age-related osteoporosis without current pathological fracture 06/16/2016  . Encounter for follow-up surveillance of ovarian cancer 05/20/2015  . 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  . Wedging of vertebra ([place]) 10/13/2014  . Polypharmacy 10/13/2014  . Encounter for general adult medical examination without abnormal findings 10/13/2014  . Anxiety 10/13/2014  . Essential hypertension 08/27/2014  . Bradycardia 08/27/2014  . Coronary artery disease of native heart with stable angina pectoris ([place]) 08/27/2014  . MI (mitral incompetence) 08/27/2014  . TI (tricuspid incompetence) 08/27/2014  . Combined fat and carbohydrate induced hyperlipemia 08/13/2014  . HTN (  hypertension), malignant 08/11/2014  . Chest pain 08/11/2014  . CAD (coronary artery disease) of artery bypass graft 08/11/2014    Plan    We will obtain CEA and CMP.  Will obtain abdominal pelvic CT scan, hopefully to ensure well localized disease process. We discussed bowel prep and proceeding with robotic right colectomy.  Risks of the procedure discussed in detail with this 85 year old patient which include anesthesia, myocardial infarction, stroke, bleeding, infection, anastomotic leak.  Her concerns were that she might require a bag.   Reviewed that these risks are not all inclusive.  Questions were asked and answered, no guarantees were ever expressed or implied.  Face-to-face time spent with the patient and accompanying care providers(if present) was 45 minutes, with more than 50% of the time spent counseling, educating, and coordinating care of the patient.      Phyllis Ross M.D., FACS 06/09/2020, 12:15 PM

## 2020-06-09 NOTE — Progress Notes (Unsigned)
Erythromycin

## 2020-06-09 NOTE — Telephone Encounter (Signed)
CT Abdomen/Pelvis scheduled 06/10/20 @ 2pm . Patient to pick up prep today. Nothing to eat/drink 4 hours prior.   CEA /CMP lab ordered today-Patient to go to lab today and pick up prep kit at Radiology desk. Patient verbalized understanding.

## 2020-06-09 NOTE — Progress Notes (Signed)
Patient ID: Phyllis Ross, female   DOB: 06-Mar-1936, 85 y.o.   MRN: [id number]  Chief Complaint: Cecal mass  History of Present Illness Phyllis Ross is a 85 y.o. 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  She was initially screened with Hemoccult cards, had a degree of anemia.  She denies any gross blood in her stools.  She otherwise has good bowel movements.  She does report a 5 pound weight loss over the last multiple months.  She otherwise has a history of ovarian cancer, she is over a decade and a half out from that.  And a history of coronary artery disease.  She is status post triple bypass.  She remains quite active, living a rather robust lifestyle.  Past Medical History Past Medical History:  Diagnosis Date  . Anemia   . Anxiety   . Cancer ([place])   . Hyperlipidemia   . Hypertension   . MI (myocardial infarction) ([place])   . Osteoporosis   . Rheumatic fever       Past Surgical History:  Procedure Laterality Date  . COLONOSCOPY  2011   normal- Dr Vira Agar  . COLONOSCOPY WITH PROPOFOL N/A 06/04/2020   Procedure: COLONOSCOPY WITH PROPOFOL;  Surgeon: Lucilla Lame, MD;  Location: [facility];  Service: Endoscopy;  Laterality: N/A;  . CORONARY ARTERY BYPASS GRAFT    . ESOPHAGOGASTRODUODENOSCOPY (EGD) WITH PROPOFOL N/A 06/04/2020   Procedure: ESOPHAGOGASTRODUODENOSCOPY (EGD) WITH PROPOFOL;  Surgeon: Lucilla Lame, MD;  Location: [facility];  Service: Endoscopy;  Laterality: N/A;  . VAGINAL HYSTERECTOMY     DUB    No Known Allergies  Current Outpatient Medications  Medication Sig Dispense Refill  . atorvastatin (LIPITOR) 80 MG tablet 1 tablet daily 90 tablet 1  . bisacodyl (DULCOLAX) 5 MG EC tablet Take 1 tablet (5 mg total) by mouth once for 1 dose. Take 4 Dulcolax tablets at 8:00 am the day before your procedure. 4 tablet 0  . ferrous sulfate 325 (65 FE) MG tablet  Take 325 mg by mouth daily with breakfast.    . lisinopril (ZESTRIL) 10 MG tablet TAKE (1) TABLET BY MOUTH TWICE DAILY 180 tablet 1  . metroNIDAZOLE (FLAGYL) 500 MG tablet Take 1 tablet (500 mg total) by mouth See admin instructions for 1 day. Take two (2) tablets at 7 PM and two (2) tablets at 11 PM the night prior to surgery. 4 tablet 0  . neomycin (MYCIFRADIN) 500 MG tablet Take 1 tablet (500 mg total) by mouth as needed. Take 2 Neomycin tablets at 8:00 am and again at 2:00 pm  And again at 8:00pm. 6 tablet 0  . polyethylene glycol powder (GLYCOLAX/MIRALAX) 17 GM/SCOOP powder Take 255 g by mouth once for 1 dose. 255 g 0  . Sodium Sulfate-Mag Sulfate-KCl (SUTAB) [phone] MG TABS Take 376 mg by mouth as directed. 24 tablet 0  . erythromycin base (E-MYCIN) 500 MG tablet Take 1 tablet (500 mg total) by mouth as needed. Take 2 Erythromycin tablets at 8:00 am, again at 2:00 pm and again at 8:00 pm the day before your surgery. 6 tablet 0   No current facility-administered medications for this visit.    Family History Family History  Problem Relation Age of Onset  . Heart attack Mother   . Heart attack Maternal Uncle   . Heart attack Maternal Uncle       Social History Social History  Tobacco Use  . Smoking status: Never Smoker  . Smokeless tobacco: Never Used  Vaping Use  . Vaping Use: Never used  Substance Use Topics  . Alcohol use: No  . Drug use: No        Review of Systems  Constitutional: Positive for weight loss.  HENT: Negative.   Eyes: Negative.   Respiratory: Negative.   Cardiovascular: Negative.   Gastrointestinal: Positive for blood in stool (Based on Hemoccult, no gross blood noted).  Genitourinary: Negative.   Skin: Negative.   Neurological: Negative.   Psychiatric/Behavioral: Negative.       Physical Exam Blood pressure (!) 192/73, pulse 66, temperature 98 F (36.7 C), temperature source Oral, height 5\' 5"  (1.651 m), weight 120 lb 6.4 oz (54.6 kg),  SpO2 98 %. Last Weight  Most recent update: 06/09/2020 10:02 AM   Weight  54.6 kg (120 lb 6.4 oz)            CONSTITUTIONAL: Well developed, and nourished, appropriately responsive and aware without distress.   EYES: Sclera non-icteric.   EARS, NOSE, MOUTH AND THROAT: Mask worn.  Hearing is intact to voice.  NECK: Trachea is midline, and there is no jugular venous distension.  LYMPH NODES:  Lymph nodes in the neck are not enlarged. RESPIRATORY:  Lungs are clear, and breath sounds are equal bilaterally. Normal respiratory effort without pathologic use of accessory muscles. CARDIOVASCULAR: Heart is regular in rate and rhythm. GI: The abdomen is soft, nontender, and nondistended. There were no palpable masses.  There is a well-healed midline abdominal scar, with some epigastric scars likely due to mediastinal tubes.  I did not appreciate hepatosplenomegaly. MUSCULOSKELETAL:  Symmetrical muscle tone appreciated in all four extremities.    SKIN: Skin turgor is normal. No pathologic skin lesions appreciated.  NEUROLOGIC:  Motor and sensation appear grossly normal.  Cranial nerves are grossly without defect. PSYCH:  Alert and oriented to person, place and time. Affect is appropriate for situation.  Data Reviewed I have personally reviewed what is currently available of the patient's imaging, recent labs and medical records.   Labs:  CBC Latest Ref Rng & Units 04/07/2020 01/29/2020 08/11/2014  WBC 3.4 - 10.8 x10E3/uL 7.9 9.8 11.4(H)  Hemoglobin 11.1 - 15.9 g/dL 10.9(L) 9.4(A) 11.9(L)  Hematocrit 34.0 - 46.6 % 37.3 32(A) 38.5  Platelets 150 - 450 x10E3/uL 302 317 253      Imaging:  Within last 24 hrs: No results found.  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  Likely cancer based on appearance during endoscopy. Patient Active Problem List   Diagnosis Date Noted  . Iron deficiency anemia due to chronic blood loss   . Neoplasm of lower  gastrointestinal tract   . Acute gastritis with hemorrhage   . Personal history of other malignant neoplasm of skin 01/21/2019  . Atherosclerosis of abdominal aorta ([place]) 12/26/2017  . Age-related osteoporosis without current pathological fracture 06/16/2016  . Encounter for follow-up surveillance of ovarian cancer 05/20/2015  . 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  . Wedging of vertebra ([place]) 10/13/2014  . Polypharmacy 10/13/2014  . Encounter for general adult medical examination without abnormal findings 10/13/2014  . Anxiety 10/13/2014  . Essential hypertension 08/27/2014  . Bradycardia 08/27/2014  . Coronary artery disease of native heart with stable angina pectoris ([place]) 08/27/2014  . MI (mitral incompetence) 08/27/2014  . TI (tricuspid incompetence) 08/27/2014  . Combined fat and carbohydrate induced hyperlipemia 08/13/2014  . HTN (  hypertension), malignant 08/11/2014  . Chest pain 08/11/2014  . CAD (coronary artery disease) of artery bypass graft 08/11/2014    Plan    We will obtain CEA and CMP.  Will obtain abdominal pelvic CT scan, hopefully to ensure well localized disease process. We discussed bowel prep and proceeding with robotic right colectomy.  Risks of the procedure discussed in detail with this 85 year old patient which include anesthesia, myocardial infarction, stroke, bleeding, infection, anastomotic leak.  Her concerns were that she might require a bag.   Reviewed that these risks are not all inclusive.  Questions were asked and answered, no guarantees were ever expressed or implied.  Face-to-face time spent with the patient and accompanying care providers(if present) was 45 minutes, with more than 50% of the time spent counseling, educating, and coordinating care of the patient.      Ronny Bacon M.D., FACS 06/09/2020, 12:15 PM

## 2020-06-09 NOTE — Patient Instructions (Addendum)
Our surgery scheduler will call you within 24-48 hours to schedule your surgery. Please have the [facility] surgery sheet available when speaking with her.  Please pick up your medication at the pharmacy.   Please follow bowel prep instruction sheet.   You will need to pick up a 64 ounce bottle of Gatorade of your choice as long as it is not red.

## 2020-06-10 ENCOUNTER — Ambulatory Visit
Admission: RE | Admit: 2020-06-10 | Discharge: 2020-06-10 | Disposition: A | Discharge status: Home / Self Care | Payer: Medicare PPO | Source: Ambulatory Visit | Attending: Surgery | Admitting: Surgery

## 2020-06-10 ENCOUNTER — Other Ambulatory Visit: Payer: Self-pay

## 2020-06-10 ENCOUNTER — Telehealth: Payer: Self-pay

## 2020-06-10 DIAGNOSIS — D490 Neoplasm of unspecified behavior of digestive system: Secondary | ICD-10-CM | POA: Insufficient documentation

## 2020-06-10 DIAGNOSIS — K6389 Other specified diseases of intestine: Secondary | ICD-10-CM | POA: Diagnosis not present

## 2020-06-10 DIAGNOSIS — N281 Cyst of kidney, acquired: Secondary | ICD-10-CM | POA: Diagnosis not present

## 2020-06-10 DIAGNOSIS — Z85038 Personal history of other malignant neoplasm of large intestine: Secondary | ICD-10-CM | POA: Diagnosis not present

## 2020-06-10 DIAGNOSIS — Z8543 Personal history of malignant neoplasm of ovary: Secondary | ICD-10-CM | POA: Diagnosis not present

## 2020-06-10 LAB — CEA: CEA: 524 ng/mL — ABNORMAL HIGH (ref 0.0–4.7)

## 2020-06-10 MED ORDER — IOHEXOL 300 MG/ML  SOLN
100.0000 mL | Freq: Once | INTRAMUSCULAR | Status: AC | PRN
  Administered 2020-06-10: 75 mL via INTRAVENOUS

## 2020-06-10 NOTE — Telephone Encounter (Unsigned)
Copied from [facility] [phone]. Topic: General - Other >> Jun 10, 2020 11:58 AM Loma Boston wrote: Pt states has Ct Scan today at 2:00 . Yesterday@hospital  in [place] ([facility]) the needle pop out of arm when drawing blood, asked pt if if nurse had futher instruction and she said just will try other arm.Pt just wants Baxter Flattery to know what is happening with her

## 2020-06-10 NOTE — Telephone Encounter (Signed)
Spoke to pt

## 2020-06-11 ENCOUNTER — Other Ambulatory Visit
Admission: RE | Admit: 2020-06-11 | Discharge: 2020-06-11 | Disposition: A | Discharge status: Home / Self Care | Payer: Medicare PPO | Source: Ambulatory Visit | Attending: Surgery | Admitting: Surgery

## 2020-06-11 DIAGNOSIS — I10 Essential (primary) hypertension: Secondary | ICD-10-CM | POA: Insufficient documentation

## 2020-06-11 DIAGNOSIS — Z01818 Encounter for other preprocedural examination: Secondary | ICD-10-CM | POA: Insufficient documentation

## 2020-06-11 DIAGNOSIS — Z20822 Contact with and (suspected) exposure to covid-19: Secondary | ICD-10-CM | POA: Insufficient documentation

## 2020-06-11 HISTORY — DX: Pneumonia, unspecified organism: J18.9

## 2020-06-11 NOTE — Patient Instructions (Addendum)
Your procedure is scheduled on: 06/15/20- Monday Report to the Registration Desk on the 1st floor of the [facility]. To find out your arrival time, please call [phone] between 1PM - 3PM on: 06/12/20  REMEMBER: Instructions that are not followed completely may result in serious medical risk, up to and including death; or upon the discretion of your surgeon and anesthesiologist your surgery may need to be rescheduled.  Do not eat food or drink any fluids after midnight the night before surgery.  No gum chewing, lozengers or hard candies.   TAKE THESE MEDICATIONS THE MORNING OF SURGERY WITH A SIP OF WATER: none  - Follow prep instructions as prescribed by Dr. Shawna Clamp office.  Follow recommendations from Cardiologist, Pulmonologist or PCP regarding stopping Aspirin, Coumadin, Plavix, Eliquis, Pradaxa, or Pletal. Per patient, stopped Aspirin 2 months ago.  One week prior to surgery: Stop Anti-inflammatories (NSAIDS) such as Advil, Aleve, Ibuprofen, Motrin, Naproxen, Naprosyn and Aspirin based products such as Excedrin, Goodys Powder, BC Powder.  Stop ANY OVER THE COUNTER supplements until after surgery.  No Alcohol for 24 hours before or after surgery.  No Smoking including e-cigarettes for 24 hours prior to surgery.  No chewable tobacco products for at least 6 hours prior to surgery.  No nicotine patches on the day of surgery.  Do not use any "recreational" drugs for at least a week prior to your surgery.  Please be advised that the combination of cocaine and anesthesia may have negative outcomes, up to and including death. If you test positive for cocaine, your surgery will be cancelled.  On the morning of surgery brush your teeth with toothpaste and water, you may rinse your mouth with mouthwash if you wish. Do not swallow any toothpaste or mouthwash.  Do not wear jewelry, make-up, hairpins, clips or nail polish.  Do not wear lotions, powders, or perfumes.   Do not  shave body from the neck down 48 hours prior to surgery just in case you cut yourself which could leave a site for infection.  Also, freshly shaved skin may become irritated if using the CHG soap.  Contact lenses, hearing aids and dentures may not be worn into surgery.  Do not bring valuables to the hospital. [facility] is not responsible for any missing/lost belongings or valuables.   Use CHG Soap or wipes as directed on instruction sheet.  Notify your doctor if there is any change in your medical condition (cold, fever, infection).  Wear comfortable clothing (specific to your surgery type) to the hospital.  Plan for stool softeners for home use; pain medications have a tendency to cause constipation. You can also help prevent constipation by eating foods high in fiber such as fruits and vegetables and drinking plenty of fluids as your diet allows.  After surgery, you can help prevent lung complications by doing breathing exercises.  Take deep breaths and cough every 1-2 hours. Your doctor may order a device called an Incentive Spirometer to help you take deep breaths. When coughing or sneezing, hold a pillow firmly against your incision with both hands. This is called "splinting." Doing this helps protect your incision. It also decreases belly discomfort.  If you are being admitted to the hospital overnight, leave your suitcase in the car. After surgery it may be brought to your room.  If you are being discharged the day of surgery, you will not be allowed to drive home. You will need a responsible adult (18 years or older) to drive you  home and stay with you that night.   If you are taking public transportation, you will need to have a responsible adult (18 years or older) with you. Please confirm with your physician that it is acceptable to use public transportation.   Please call the [facility] at [phone] if you have any questions about these  instructions.  Surgery Visitation Policy:  Patients undergoing a surgery or procedure may have one family member or support person with them as long as that person is not COVID-19 positive or experiencing its symptoms.  That person may remain in the waiting area during the procedure.  Inpatient Visitation:    Visiting hours are 7 a.m. to 8 p.m. Inpatients will be allowed two visitors daily. The visitors may change each day during the patient's stay. No visitors under the age of 71. Any visitor under the age of 77 must be accompanied by an adult. The visitor must pass COVID-19 screenings, use hand sanitizer when entering and exiting the patient's room and wear a mask at all times, including in the patient's room. Patients must also wear a mask when staff or their visitor are in the room. Masking is required regardless of vaccination status.

## 2020-06-12 ENCOUNTER — Other Ambulatory Visit: Payer: Self-pay

## 2020-06-12 ENCOUNTER — Encounter
Admission: RE | Admit: 2020-06-12 | Discharge: 2020-06-12 | Disposition: A | Discharge status: Home / Self Care | Payer: Medicare PPO | Source: Ambulatory Visit | Attending: Surgery | Admitting: Surgery

## 2020-06-12 DIAGNOSIS — Z01810 Encounter for preprocedural cardiovascular examination: Secondary | ICD-10-CM | POA: Diagnosis not present

## 2020-06-12 DIAGNOSIS — Z20822 Contact with and (suspected) exposure to covid-19: Secondary | ICD-10-CM | POA: Diagnosis not present

## 2020-06-12 DIAGNOSIS — I10 Essential (primary) hypertension: Secondary | ICD-10-CM | POA: Diagnosis not present

## 2020-06-12 DIAGNOSIS — Z01818 Encounter for other preprocedural examination: Secondary | ICD-10-CM | POA: Diagnosis not present

## 2020-06-12 LAB — CBC
HCT: 36.5 % (ref 36.0–46.0)
Hemoglobin: 11.1 g/dL — ABNORMAL LOW (ref 12.0–15.0)
MCH: 20.6 pg — ABNORMAL LOW (ref 26.0–34.0)
MCHC: 30.4 g/dL (ref 30.0–36.0)
MCV: 67.6 fL — ABNORMAL LOW (ref 80.0–100.0)
Platelets: 313 10*3/uL (ref 150–400)
RBC: 5.4 MIL/uL — ABNORMAL HIGH (ref 3.87–5.11)
RDW: 16.8 % — ABNORMAL HIGH (ref 11.5–15.5)
WBC: 8.3 10*3/uL (ref 4.0–10.5)
nRBC: 0 % (ref 0.0–0.2)

## 2020-06-12 LAB — TYPE AND SCREEN
ABO/RH(D): O POS
Antibody Screen: NEGATIVE

## 2020-06-12 LAB — SARS CORONAVIRUS 2 (TAT 6-24 HRS): SARS Coronavirus 2: NEGATIVE

## 2020-06-15 ENCOUNTER — Inpatient Hospital Stay
Admission: RE | Admit: 2020-06-15 | Discharge: 2020-06-26 | DRG: 329 | Disposition: A | Discharge status: Home Health | Payer: Medicare PPO | Attending: Surgery | Admitting: Surgery

## 2020-06-15 ENCOUNTER — Encounter: Payer: Self-pay | Admitting: Surgery

## 2020-06-15 ENCOUNTER — Encounter
Admission: RE | Disposition: A | Discharge status: Home Health | Payer: Self-pay | Source: Home / Self Care | Attending: Surgery

## 2020-06-15 ENCOUNTER — Inpatient Hospital Stay: Payer: Medicare PPO | Admitting: Anesthesiology

## 2020-06-15 ENCOUNTER — Other Ambulatory Visit: Payer: Self-pay

## 2020-06-15 DIAGNOSIS — Z85828 Personal history of other malignant neoplasm of skin: Secondary | ICD-10-CM

## 2020-06-15 DIAGNOSIS — E785 Hyperlipidemia, unspecified: Secondary | ICD-10-CM | POA: Diagnosis present

## 2020-06-15 DIAGNOSIS — Z8543 Personal history of malignant neoplasm of ovary: Secondary | ICD-10-CM | POA: Diagnosis not present

## 2020-06-15 DIAGNOSIS — I2510 Atherosclerotic heart disease of native coronary artery without angina pectoris: Secondary | ICD-10-CM | POA: Diagnosis present

## 2020-06-15 DIAGNOSIS — I25810 Atherosclerosis of coronary artery bypass graft(s) without angina pectoris: Secondary | ICD-10-CM | POA: Diagnosis not present

## 2020-06-15 DIAGNOSIS — C180 Malignant neoplasm of cecum: Secondary | ICD-10-CM | POA: Diagnosis not present

## 2020-06-15 DIAGNOSIS — T8143XA Infection following a procedure, organ and space surgical site, initial encounter: Secondary | ICD-10-CM | POA: Diagnosis not present

## 2020-06-15 DIAGNOSIS — K651 Peritoneal abscess: Secondary | ICD-10-CM | POA: Diagnosis not present

## 2020-06-15 DIAGNOSIS — Z79899 Other long term (current) drug therapy: Secondary | ICD-10-CM

## 2020-06-15 DIAGNOSIS — Z681 Body mass index (BMI) 19 or less, adult: Secondary | ICD-10-CM | POA: Diagnosis not present

## 2020-06-15 DIAGNOSIS — Z5331 Laparoscopic surgical procedure converted to open procedure: Secondary | ICD-10-CM

## 2020-06-15 DIAGNOSIS — F419 Anxiety disorder, unspecified: Secondary | ICD-10-CM | POA: Diagnosis present

## 2020-06-15 DIAGNOSIS — E43 Unspecified severe protein-calorie malnutrition: Secondary | ICD-10-CM | POA: Diagnosis present

## 2020-06-15 DIAGNOSIS — D72828 Other elevated white blood cell count: Secondary | ICD-10-CM | POA: Diagnosis not present

## 2020-06-15 DIAGNOSIS — K660 Peritoneal adhesions (postprocedural) (postinfection): Secondary | ICD-10-CM | POA: Diagnosis present

## 2020-06-15 DIAGNOSIS — R197 Diarrhea, unspecified: Secondary | ICD-10-CM | POA: Diagnosis not present

## 2020-06-15 DIAGNOSIS — E871 Hypo-osmolality and hyponatremia: Secondary | ICD-10-CM | POA: Diagnosis present

## 2020-06-15 DIAGNOSIS — C772 Secondary and unspecified malignant neoplasm of intra-abdominal lymph nodes: Secondary | ICD-10-CM | POA: Diagnosis present

## 2020-06-15 DIAGNOSIS — D500 Iron deficiency anemia secondary to blood loss (chronic): Secondary | ICD-10-CM | POA: Diagnosis not present

## 2020-06-15 DIAGNOSIS — R188 Other ascites: Secondary | ICD-10-CM | POA: Diagnosis not present

## 2020-06-15 DIAGNOSIS — I081 Rheumatic disorders of both mitral and tricuspid valves: Secondary | ICD-10-CM | POA: Diagnosis present

## 2020-06-15 DIAGNOSIS — I252 Old myocardial infarction: Secondary | ICD-10-CM | POA: Diagnosis not present

## 2020-06-15 DIAGNOSIS — K6389 Other specified diseases of intestine: Secondary | ICD-10-CM | POA: Diagnosis not present

## 2020-06-15 DIAGNOSIS — I700 Atherosclerosis of aorta: Secondary | ICD-10-CM | POA: Diagnosis present

## 2020-06-15 DIAGNOSIS — M810 Age-related osteoporosis without current pathological fracture: Secondary | ICD-10-CM | POA: Diagnosis present

## 2020-06-15 DIAGNOSIS — Z8673 Personal history of transient ischemic attack (TIA), and cerebral infarction without residual deficits: Secondary | ICD-10-CM

## 2020-06-15 DIAGNOSIS — N200 Calculus of kidney: Secondary | ICD-10-CM | POA: Diagnosis not present

## 2020-06-15 DIAGNOSIS — Z90710 Acquired absence of both cervix and uterus: Secondary | ICD-10-CM

## 2020-06-15 DIAGNOSIS — D490 Neoplasm of unspecified behavior of digestive system: Secondary | ICD-10-CM

## 2020-06-15 DIAGNOSIS — Z951 Presence of aortocoronary bypass graft: Secondary | ICD-10-CM

## 2020-06-15 DIAGNOSIS — I25118 Atherosclerotic heart disease of native coronary artery with other forms of angina pectoris: Secondary | ICD-10-CM | POA: Diagnosis not present

## 2020-06-15 DIAGNOSIS — I10 Essential (primary) hypertension: Secondary | ICD-10-CM | POA: Diagnosis not present

## 2020-06-15 DIAGNOSIS — Z8249 Family history of ischemic heart disease and other diseases of the circulatory system: Secondary | ICD-10-CM

## 2020-06-15 LAB — ABO/RH: ABO/RH(D): O POS

## 2020-06-15 SURGERY — COLECTOMY, RIGHT, ROBOT-ASSISTED
Anesthesia: General

## 2020-06-15 MED ORDER — DEXMEDETOMIDINE (PRECEDEX) IN NS 20 MCG/5ML (4 MCG/ML) IV SYRINGE
PREFILLED_SYRINGE | INTRAVENOUS | Status: DC | PRN
  Administered 2020-06-15 (×2): 8 ug via INTRAVENOUS
  Administered 2020-06-15: 4 ug via INTRAVENOUS

## 2020-06-15 MED ORDER — ALVIMOPAN 12 MG PO CAPS
ORAL_CAPSULE | ORAL | Status: AC
  Administered 2020-06-15: 12 mg via ORAL
  Filled 2020-06-15: qty 1

## 2020-06-15 MED ORDER — FAMOTIDINE 20 MG PO TABS
ORAL_TABLET | ORAL | Status: AC
  Administered 2020-06-15: 20 mg via ORAL
  Filled 2020-06-15: qty 1

## 2020-06-15 MED ORDER — ROCURONIUM BROMIDE 10 MG/ML (PF) SYRINGE
PREFILLED_SYRINGE | INTRAVENOUS | Status: AC
  Filled 2020-06-15: qty 10

## 2020-06-15 MED ORDER — FENTANYL CITRATE (PF) 100 MCG/2ML IJ SOLN
INTRAMUSCULAR | Status: AC
  Filled 2020-06-15: qty 2

## 2020-06-15 MED ORDER — FENTANYL CITRATE (PF) 100 MCG/2ML IJ SOLN
25.0000 ug | INTRAMUSCULAR | Status: DC | PRN
Start: 2020-06-15 — End: 2020-06-15

## 2020-06-15 MED ORDER — PHENYLEPHRINE HCL (PRESSORS) 10 MG/ML IV SOLN
INTRAVENOUS | Status: AC
  Filled 2020-06-15: qty 1

## 2020-06-15 MED ORDER — BUPIVACAINE LIPOSOME 1.3 % IJ SUSP: 20.0000 mL | Freq: Once | INTRAMUSCULAR | Status: DC

## 2020-06-15 MED ORDER — CHLORHEXIDINE GLUCONATE CLOTH 2 % EX PADS: 6.0000 | MEDICATED_PAD | Freq: Once | CUTANEOUS | Status: DC

## 2020-06-15 MED ORDER — ALPRAZOLAM 0.5 MG PO TABS
0.2500 mg | ORAL_TABLET | Freq: Every day | ORAL | Status: DC | PRN
  Administered 2020-06-20: 0.25 mg via ORAL
  Filled 2020-06-15: qty 1

## 2020-06-15 MED ORDER — SUGAMMADEX SODIUM 200 MG/2ML IV SOLN
INTRAVENOUS | Status: DC | PRN
  Administered 2020-06-15: 150 mg via INTRAVENOUS

## 2020-06-15 MED ORDER — ONDANSETRON HCL 4 MG/2ML IJ SOLN
INTRAMUSCULAR | Status: DC | PRN
  Administered 2020-06-15 (×2): 4 mg via INTRAVENOUS

## 2020-06-15 MED ORDER — PHENYLEPHRINE HCL (PRESSORS) 10 MG/ML IV SOLN
INTRAVENOUS | Status: DC | PRN
  Administered 2020-06-15 (×4): 100 ug via INTRAVENOUS

## 2020-06-15 MED ORDER — PROPOFOL 10 MG/ML IV BOLUS
INTRAVENOUS | Status: DC | PRN
  Administered 2020-06-15: 100 mg via INTRAVENOUS

## 2020-06-15 MED ORDER — ACETAMINOPHEN 500 MG PO TABS: 1000.0000 mg | ORAL_TABLET | ORAL | Status: AC

## 2020-06-15 MED ORDER — ACETAMINOPHEN 650 MG RE SUPP: 650.0000 mg | Freq: Four times a day (QID) | RECTAL | Status: DC | PRN

## 2020-06-15 MED ORDER — ATORVASTATIN CALCIUM 20 MG PO TABS
80.0000 mg | ORAL_TABLET | Freq: Every day | ORAL | Status: DC
  Administered 2020-06-16 – 2020-06-25 (×9): 80 mg via ORAL
  Filled 2020-06-15 (×13): qty 4

## 2020-06-15 MED ORDER — KETAMINE HCL 50 MG/5ML IJ SOSY
PREFILLED_SYRINGE | INTRAMUSCULAR | Status: AC
  Filled 2020-06-15: qty 5

## 2020-06-15 MED ORDER — GABAPENTIN 300 MG PO CAPS
300.0000 mg | ORAL_CAPSULE | Freq: Two times a day (BID) | ORAL | Status: DC
  Administered 2020-06-16 – 2020-06-26 (×22): 300 mg via ORAL
  Filled 2020-06-15 (×22): qty 1

## 2020-06-15 MED ORDER — CELECOXIB 200 MG PO CAPS
200.0000 mg | ORAL_CAPSULE | Freq: Two times a day (BID) | ORAL | Status: DC
  Administered 2020-06-16 – 2020-06-26 (×22): 200 mg via ORAL
  Filled 2020-06-15 (×23): qty 1

## 2020-06-15 MED ORDER — ALVIMOPAN 12 MG PO CAPS: 12.0000 mg | ORAL_CAPSULE | ORAL | Status: AC

## 2020-06-15 MED ORDER — KETAMINE HCL 50 MG/ML IJ SOLN
INTRAMUSCULAR | Status: DC | PRN
  Administered 2020-06-15 (×2): 25 mg via INTRAVENOUS

## 2020-06-15 MED ORDER — ACETAMINOPHEN 10 MG/ML IV SOLN
INTRAVENOUS | Status: DC | PRN
  Administered 2020-06-15: 1000 mg via INTRAVENOUS

## 2020-06-15 MED ORDER — DEXAMETHASONE SODIUM PHOSPHATE 10 MG/ML IJ SOLN
INTRAMUSCULAR | Status: AC
  Filled 2020-06-15: qty 1

## 2020-06-15 MED ORDER — HYDROCODONE-ACETAMINOPHEN 5-325 MG PO TABS
1.0000 | ORAL_TABLET | ORAL | Status: DC | PRN
  Administered 2020-06-17 – 2020-06-21 (×3): 1 via ORAL
  Filled 2020-06-15 (×3): qty 1

## 2020-06-15 MED ORDER — ROCURONIUM BROMIDE 100 MG/10ML IV SOLN
INTRAVENOUS | Status: DC | PRN
  Administered 2020-06-15: 20 mg via INTRAVENOUS
  Administered 2020-06-15: 50 mg via INTRAVENOUS

## 2020-06-15 MED ORDER — PROPOFOL 10 MG/ML IV BOLUS
INTRAVENOUS | Status: AC
  Filled 2020-06-15: qty 20

## 2020-06-15 MED ORDER — ONDANSETRON HCL 4 MG/2ML IJ SOLN
INTRAMUSCULAR | Status: AC
  Filled 2020-06-15: qty 2

## 2020-06-15 MED ORDER — ORAL CARE MOUTH RINSE: 15.0000 mL | Freq: Once | OROMUCOSAL | Status: AC

## 2020-06-15 MED ORDER — FENTANYL CITRATE (PF) 100 MCG/2ML IJ SOLN
INTRAMUSCULAR | Status: DC | PRN
  Administered 2020-06-15 (×4): 50 ug via INTRAVENOUS

## 2020-06-15 MED ORDER — CHLORHEXIDINE GLUCONATE 0.12 % MT SOLN
OROMUCOSAL | Status: AC
  Administered 2020-06-15: 15 mL via OROMUCOSAL
  Filled 2020-06-15: qty 15

## 2020-06-15 MED ORDER — LIDOCAINE HCL (CARDIAC) PF 100 MG/5ML IV SOSY
PREFILLED_SYRINGE | INTRAVENOUS | Status: DC | PRN
  Administered 2020-06-15: 50 mg via INTRAVENOUS

## 2020-06-15 MED ORDER — BUPIVACAINE LIPOSOME 1.3 % IJ SUSP
INTRAMUSCULAR | Status: AC
  Filled 2020-06-15: qty 20

## 2020-06-15 MED ORDER — METOPROLOL TARTRATE 5 MG/5ML IV SOLN
5.0000 mg | Freq: Four times a day (QID) | INTRAVENOUS | Status: DC | PRN
  Administered 2020-06-21: 5 mg via INTRAVENOUS
  Filled 2020-06-15: qty 5

## 2020-06-15 MED ORDER — SODIUM CHLORIDE 0.9 % IV SOLN
1.0000 g | INTRAVENOUS | Status: AC
  Administered 2020-06-15: 1 g via INTRAVENOUS
  Filled 2020-06-15: qty 1

## 2020-06-15 MED ORDER — ALBUMIN HUMAN 5 % IV SOLN
INTRAVENOUS | Status: AC
  Filled 2020-06-15: qty 250

## 2020-06-15 MED ORDER — BUPIVACAINE LIPOSOME 1.3 % IJ SUSP
INTRAMUSCULAR | Status: DC | PRN
  Administered 2020-06-15: 20 mL

## 2020-06-15 MED ORDER — MORPHINE SULFATE (PF) 2 MG/ML IV SOLN
2.0000 mg | INTRAVENOUS | Status: DC | PRN
  Administered 2020-06-16: 2 mg via INTRAVENOUS
  Filled 2020-06-15: qty 1

## 2020-06-15 MED ORDER — ACETAMINOPHEN 325 MG PO TABS
650.0000 mg | ORAL_TABLET | Freq: Four times a day (QID) | ORAL | Status: DC | PRN
  Administered 2020-06-16 – 2020-06-17 (×2): 650 mg via ORAL
  Filled 2020-06-15 (×3): qty 2

## 2020-06-15 MED ORDER — BUPIVACAINE-EPINEPHRINE (PF) 0.25% -1:200000 IJ SOLN
INTRAMUSCULAR | Status: DC | PRN
  Administered 2020-06-15 (×3): 10 mL

## 2020-06-15 MED ORDER — ONDANSETRON HCL 4 MG/2ML IJ SOLN
4.0000 mg | Freq: Four times a day (QID) | INTRAMUSCULAR | Status: DC | PRN
  Administered 2020-06-22 – 2020-06-25 (×3): 4 mg via INTRAVENOUS
  Filled 2020-06-15 (×3): qty 2

## 2020-06-15 MED ORDER — MEPERIDINE HCL 50 MG/ML IJ SOLN
6.2500 mg | INTRAMUSCULAR | Status: DC | PRN
Start: 2020-06-15 — End: 2020-06-15

## 2020-06-15 MED ORDER — OXYCODONE HCL 5 MG PO TABS
5.0000 mg | ORAL_TABLET | Freq: Once | ORAL | Status: DC | PRN
Start: 2020-06-15 — End: 2020-06-15

## 2020-06-15 MED ORDER — GLYCOPYRROLATE 0.2 MG/ML IJ SOLN
INTRAMUSCULAR | Status: AC
  Filled 2020-06-15: qty 1

## 2020-06-15 MED ORDER — ACETAMINOPHEN 10 MG/ML IV SOLN
INTRAVENOUS | Status: AC
  Filled 2020-06-15: qty 100

## 2020-06-15 MED ORDER — GABAPENTIN 300 MG PO CAPS
ORAL_CAPSULE | ORAL | Status: AC
  Administered 2020-06-15: 300 mg via ORAL
  Filled 2020-06-15: qty 1

## 2020-06-15 MED ORDER — OXYCODONE HCL 5 MG/5ML PO SOLN
5.0000 mg | Freq: Once | ORAL | Status: DC | PRN
Start: 2020-06-15 — End: 2020-06-15

## 2020-06-15 MED ORDER — DEXAMETHASONE SODIUM PHOSPHATE 10 MG/ML IJ SOLN
INTRAMUSCULAR | Status: DC | PRN
  Administered 2020-06-15: 10 mg via INTRAVENOUS

## 2020-06-15 MED ORDER — GABAPENTIN 300 MG PO CAPS: 300.0000 mg | ORAL_CAPSULE | ORAL | Status: AC

## 2020-06-15 MED ORDER — EPHEDRINE 5 MG/ML INJ
INTRAVENOUS | Status: AC
  Filled 2020-06-15: qty 10

## 2020-06-15 MED ORDER — ALBUMIN HUMAN 5 % IV SOLN
INTRAVENOUS | Status: AC
  Filled 2020-06-15: qty 500

## 2020-06-15 MED ORDER — DEXMEDETOMIDINE (PRECEDEX) IN NS 20 MCG/5ML (4 MCG/ML) IV SYRINGE
PREFILLED_SYRINGE | INTRAVENOUS | Status: AC
  Filled 2020-06-15: qty 5

## 2020-06-15 MED ORDER — CHLORHEXIDINE GLUCONATE 0.12 % MT SOLN: 15.0000 mL | Freq: Once | OROMUCOSAL | Status: AC

## 2020-06-15 MED ORDER — LACTATED RINGERS IV SOLN: INTRAVENOUS | Status: DC

## 2020-06-15 MED ORDER — LIDOCAINE HCL (PF) 2 % IJ SOLN
INTRAMUSCULAR | Status: AC
  Filled 2020-06-15: qty 5

## 2020-06-15 MED ORDER — ONDANSETRON 4 MG PO TBDP: 4.0000 mg | ORAL_TABLET | Freq: Four times a day (QID) | ORAL | Status: DC | PRN

## 2020-06-15 MED ORDER — BUPIVACAINE-EPINEPHRINE (PF) 0.25% -1:200000 IJ SOLN
INTRAMUSCULAR | Status: AC
  Filled 2020-06-15: qty 30

## 2020-06-15 MED ORDER — ALVIMOPAN 12 MG PO CAPS
12.0000 mg | ORAL_CAPSULE | Freq: Two times a day (BID) | ORAL | Status: DC
  Administered 2020-06-16: 12 mg via ORAL
  Filled 2020-06-15 (×3): qty 1

## 2020-06-15 MED ORDER — ACETAMINOPHEN 500 MG PO TABS
ORAL_TABLET | ORAL | Status: AC
  Administered 2020-06-15: 1000 mg via ORAL
  Filled 2020-06-15: qty 2

## 2020-06-15 MED ORDER — SEVOFLURANE IN SOLN
RESPIRATORY_TRACT | Status: AC
  Filled 2020-06-15: qty 250

## 2020-06-15 MED ORDER — FAMOTIDINE 20 MG PO TABS: 20.0000 mg | ORAL_TABLET | Freq: Once | ORAL | Status: AC

## 2020-06-15 MED ORDER — GLYCOPYRROLATE 0.2 MG/ML IJ SOLN
INTRAMUSCULAR | Status: DC | PRN
  Administered 2020-06-15: .2 mg via INTRAVENOUS

## 2020-06-15 MED ORDER — PROMETHAZINE HCL 25 MG/ML IJ SOLN: 6.2500 mg | INTRAMUSCULAR | Status: DC | PRN

## 2020-06-15 MED ORDER — EPHEDRINE SULFATE 50 MG/ML IJ SOLN
INTRAMUSCULAR | Status: DC | PRN
  Administered 2020-06-15: 5 mg via INTRAVENOUS
  Administered 2020-06-15: 10 mg via INTRAVENOUS
  Administered 2020-06-15: 5 mg via INTRAVENOUS

## 2020-06-15 MED ORDER — ALBUMIN HUMAN 5 % IV SOLN: INTRAVENOUS | Status: DC | PRN

## 2020-06-15 SURGICAL SUPPLY — 86 items
ANCHOR TIS RET SYS 1550ML (BAG) IMPLANT
BLADE SURG SZ10 CARB STEEL (BLADE) ×2 IMPLANT
CANNULA REDUC XI 12-8 STAPL (CANNULA) ×1
CANNULA REDUCER 12-8 DVNC XI (CANNULA) ×1 IMPLANT
CHLORAPREP W/TINT 26 (MISCELLANEOUS) ×2 IMPLANT
COVER TIP SHEARS 8 DVNC (MISCELLANEOUS) ×1 IMPLANT
COVER TIP SHEARS 8MM DA VINCI (MISCELLANEOUS) ×1
COVER WAND RF STERILE (DRAPES) ×2 IMPLANT
DECANTER SPIKE VIAL GLASS SM (MISCELLANEOUS) ×2 IMPLANT
DEFOGGER SCOPE WARMER CLEARIFY (MISCELLANEOUS) ×2 IMPLANT
DERMABOND ADVANCED (GAUZE/BANDAGES/DRESSINGS) ×1
DERMABOND ADVANCED .7 DNX12 (GAUZE/BANDAGES/DRESSINGS) ×1 IMPLANT
DRAPE ARM DVNC X/XI (DISPOSABLE) ×4 IMPLANT
DRAPE COLUMN DVNC XI (DISPOSABLE) ×1 IMPLANT
DRAPE DA VINCI XI ARM (DISPOSABLE) ×4
DRAPE DA VINCI XI COLUMN (DISPOSABLE) ×1
DRSG AQUACEL AG 3.5X4 (GAUZE/BANDAGES/DRESSINGS) ×6 IMPLANT
DRSG AQUACEL AG ADV 3.5X10 (GAUZE/BANDAGES/DRESSINGS) ×2 IMPLANT
ELECT BLADE 6.5 EXT (BLADE) ×2 IMPLANT
ELECT REM PT RETURN 9FT ADLT (ELECTROSURGICAL) ×2
ELECTRODE REM PT RTRN 9FT ADLT (ELECTROSURGICAL) ×1 IMPLANT
GELPOINT ADV PLATFORM (ENDOMECHANICALS) ×2
GLOVE ORTHO TXT STRL SZ7.5 (GLOVE) ×8 IMPLANT
GLOVE SURG SYN 6.5 ES PF (GLOVE) ×4 IMPLANT
GLOVE SURG SYN 7.0 (GLOVE) ×2 IMPLANT
GLOVE SURG SYN 7.5  E (GLOVE) ×2
GLOVE SURG SYN 7.5 E (GLOVE) ×2 IMPLANT
GLOVE SURG UNDER POLY LF SZ7.5 (GLOVE) ×4 IMPLANT
GOWN STRL REUS W/ TWL LRG LVL3 (GOWN DISPOSABLE) ×6 IMPLANT
GOWN STRL REUS W/TWL LRG LVL3 (GOWN DISPOSABLE) ×6
GRASPER SUT TROCAR 14GX15 (MISCELLANEOUS) IMPLANT
HANDLE YANKAUER SUCT BULB TIP (MISCELLANEOUS) ×2 IMPLANT
IRRIGATION STRYKERFLOW (MISCELLANEOUS) IMPLANT
IRRIGATOR STRYKERFLOW (MISCELLANEOUS)
IV NS IRRIG 3000ML ARTHROMATIC (IV SOLUTION) IMPLANT
KIT IMAGING PINPOINTPAQ (MISCELLANEOUS) ×4 IMPLANT
KIT PINK PAD W/HEAD ARE REST (MISCELLANEOUS) ×2
KIT PINK PAD W/HEAD ARM REST (MISCELLANEOUS) ×1 IMPLANT
KIT TURNOVER KIT A (KITS) ×2 IMPLANT
LIGASURE IMPACT 36 18CM CVD LR (INSTRUMENTS) ×2 IMPLANT
MANIFOLD NEPTUNE II (INSTRUMENTS) ×2 IMPLANT
NEEDLE HYPO 22GX1.5 SAFETY (NEEDLE) ×2 IMPLANT
NEEDLE INSUFFLATION 14GA 120MM (NEEDLE) ×2 IMPLANT
NS IRRIG 500ML POUR BTL (IV SOLUTION) ×2 IMPLANT
PACK COLON CLEAN CLOSURE (MISCELLANEOUS) ×2 IMPLANT
PACK LAP CHOLECYSTECTOMY (MISCELLANEOUS) ×2 IMPLANT
PLATFORM STD W/COL CELL SVR (ENDOMECHANICALS) ×1 IMPLANT
PORT ACCESS TROCAR AIRSEAL 5 (TROCAR) ×2 IMPLANT
RELOAD LINEAR CUT PROX 55 BLUE (ENDOMECHANICALS) ×6 IMPLANT
RELOAD STAPLER 3.5X45 BLU DVNC (STAPLE) IMPLANT
RELOAD STAPLER 3.5X60 BLU DVNC (STAPLE) IMPLANT
RETRACTOR WOUND ALXS 18CM MED (MISCELLANEOUS) IMPLANT
RTRCTR WOUND ALEXIS O 18CM MED (MISCELLANEOUS)
SCISSORS METZENBAUM CVD 33 (INSTRUMENTS) IMPLANT
SEAL CANN UNIV 5-8 DVNC XI (MISCELLANEOUS) ×3 IMPLANT
SEAL XI 5MM-8MM UNIVERSAL (MISCELLANEOUS) ×3
SEALER VESSEL DA VINCI XI (MISCELLANEOUS)
SEALER VESSEL EXT DVNC XI (MISCELLANEOUS) IMPLANT
SET BI-LUMEN FLTR TB AIRSEAL (TUBING) ×2 IMPLANT
SOLUTION ELECTROLUBE (MISCELLANEOUS) ×2 IMPLANT
SPONGE LAP 4X18 RFD (DISPOSABLE) ×2 IMPLANT
STAPLER 45 DA VINCI SURE FORM (STAPLE)
STAPLER 45 SUREFORM DVNC (STAPLE) IMPLANT
STAPLER 60 DA VINCI SURE FORM (STAPLE)
STAPLER 60 SUREFORM DVNC (STAPLE) IMPLANT
STAPLER CANNULA SEAL DVNC XI (STAPLE) ×1 IMPLANT
STAPLER CANNULA SEAL XI (STAPLE) ×1
STAPLER PROXIMATE 55 BLUE (STAPLE) ×2 IMPLANT
STAPLER RELOAD 3.5X45 BLU DVNC (STAPLE)
STAPLER RELOAD 3.5X45 BLUE (STAPLE)
STAPLER RELOAD 3.5X60 BLU DVNC (STAPLE)
STAPLER RELOAD 3.5X60 BLUE (STAPLE)
STAPLER SKIN PROX 35W (STAPLE) ×2 IMPLANT
SUT MNCRL 4-0 (SUTURE) ×1
SUT MNCRL 4-0 27XMFL (SUTURE) ×1
SUT PDS AB 0 CT1 27 (SUTURE) ×6 IMPLANT
SUT SILK 3-0 (SUTURE) ×2 IMPLANT
SUT V-LOC 90 ABS 3-0 VLT  V-20 (SUTURE) ×2
SUT V-LOC 90 ABS 3-0 VLT V-20 (SUTURE) ×2 IMPLANT
SUT VIC AB 3-0 SH 27 (SUTURE) ×1
SUT VIC AB 3-0 SH 27X BRD (SUTURE) ×1 IMPLANT
SUTURE MNCRL 4-0 27XMF (SUTURE) ×1 IMPLANT
TRAY FOLEY MTR SLVR 16FR STAT (SET/KITS/TRAYS/PACK) ×2 IMPLANT
TROCAR Z-THREAD FIOS 11X100 BL (TROCAR) ×2 IMPLANT
TROCAR Z-THREAD OPTICAL 5X100M (TROCAR) IMPLANT
TUBING EVAC SMOKE HEATED PNEUM (TUBING) ×2 IMPLANT

## 2020-06-15 NOTE — Interval H&P Note (Signed)
History and Physical Interval Note:  06/15/2020 12:56 PM  Phyllis Ross  has presented today for surgery, with the diagnosis of right colon mass.  The various methods of treatment have been discussed with the patient and family. After consideration of risks, benefits and other options for treatment, the patient has consented to  Procedure(s) with comments: XI ROBOT ASSISTED RIGHT COLECTOMY (N/A) - provider requesting 3 hours /180 minutes for procedure. as a surgical intervention.  The patient's history has been reviewed, patient examined, no change in status, stable for surgery.  I have reviewed the patient's chart and labs.  Questions were answered to the patient's satisfaction.     Ronny Bacon

## 2020-06-15 NOTE — Anesthesia Procedure Notes (Signed)
Procedure Name: Intubation Performed by: Rolla Plate, CRNA Pre-anesthesia Checklist: Patient identified, Patient being monitored, Timeout performed, Emergency Drugs available and Suction available Patient Re-evaluated:Patient Re-evaluated prior to induction Oxygen Delivery Method: Circle system utilized Preoxygenation: Pre-oxygenation with 100% oxygen Induction Type: IV induction Ventilation: Mask ventilation without difficulty Laryngoscope Size: 3 and McGraph Grade View: Grade I Tube type: Oral Tube size: 7.0 mm Number of attempts: 1 Airway Equipment and Method: Stylet and Video-laryngoscopy Placement Confirmation: ETT inserted through vocal cords under direct vision,  positive ETCO2 and breath sounds checked- equal and bilateral Secured at: 20 cm Tube secured with: Tape Dental Injury: Teeth and Oropharynx as per pre-operative assessment  Difficulty Due To: Difficulty was anticipated, Difficult Airway- due to limited oral opening, Difficult Airway- due to dentition and Difficult Airway- due to anterior larynx Future Recommendations: Recommend- induction with short-acting agent, and alternative techniques readily available

## 2020-06-15 NOTE — Op Note (Signed)
Robotic assisted laparoscopic lysis of adhesions, with planned right hemicolectomy.  Converted/open right hemicolectomy, with completion of pelvic lysis of adhesions.  Pre-operative Diagnosis: Cecal cancer, advanced.  Post-operative Diagnosis: same.   Surgeon: Ronny Bacon, M.D., [facility]  Anesthesia: General endotracheal  Findings: As anticipated, absence of omentum, extensive small bowel adhesions, to pelvis, pelvic retroperitoneal structures, large cecal mass with mesenteric nodal masses.  No evidence of serosal penetration or peritoneal implants.  No palpable masses in the liver, no palpable gallstones in the gallbladder.  Estimated Blood Loss: 100 mL         Specimens: Right colon, with extended resection of distal ileum due to enterotomy.          Complications: none              Procedure Details  The patient was seen again in the Holding Room. The benefits, complications, treatment options, and expected outcomes were discussed with the patient. The risks of bleeding, infection, recurrence of symptoms, failure to resolve symptoms, unanticipated injury, prosthetic placement, prosthetic infection, any of which could require further surgery were reviewed with the patient. The likelihood of improving the patient's symptoms with return to their baseline status is reasonable.  The patient and/or family concurred with the proposed plan, giving informed consent.  The patient was taken to Operating Room, identified and the procedure verified.    Prior to the induction of general anesthesia, antibiotic prophylaxis was administered. VTE prophylaxis was in place.  General anesthesia was then administered and tolerated well. After the induction, Foley catheter was placed, the patient was positioned in the supine position and the abdomen was prepped with Chloraprep and draped in the sterile fashion.  A Time Out was held and the above information confirmed. We began with a suprapubic incision, a  Pfannenstiel incision was created, the peritoneum was carefully entered.  Then placed a small GelPort and initiated insufflation.  A 12 mm trocar was placed through the GelPort, and under direct visualization local infiltration of anesthetic was applied to the left lower quadrant and left lateral incision sites, 2 trochars were placed there of 8 mm under direct visualization.  We then looked toward the GelPort site and noted some small bowel adhesions present around the GelPort, initially these were taken down laparoscopically until it was felt prudent to remove the GelPort to finish the lysis of adhesions.  Once this was done and the right lower quadrant was now cleared of adhesions a right lower quadrant 8 mm port was placed under direct visualization.  There were clearly some additional adhesions in the region of the right colon, was hopeful that we could continue robotically from this point. The robot was docked from the patient's left side.  The patient's position was then made Trendelenburg with left side down, and additional lysis of adhesions was performed robotically totaling a minimum of 1-1.5 hours.  We struggled with mobilizing the distal ileum which was still quite pelvic in location.  And despite the fact that I could mobilize the right colon I did not feel I could initiate the planned ileal colic mesenteric, retromesenteric dissection plane.  I did attempt to mobilize the colon laterally, but the mesenteric adenopathy, and cecal adhesions were too dense.  These adhesions were to the retroperitoneal structures and I felt it was prudent to convert to an open procedure at this point so that I can feel the degree of scarring versus neoplastic infiltration. Therefore we undocked the robot, and I extended a midline incision from  the suprapubic Pfannenstiel incision. It still took another another hour to complete the pelvic small bowel lysis of adhesions even open.  I had made an enterotomy in the distal  ileum. I then completed with medial to lateral mobilization of the right colon, identifying the duodenum readily, and the most proximal lymphadenopathy. Proceeded with division of the distal small bowel to include the enterotomy in the specimen.  This was completed with a GIA stapler.  I then divided the transverse colon just proximal to the middle colic pedicle.  Thus maintaining the middle colic circulation.  The ileocolic vasculature was taken at the mesenteric root utilizing the LigaSure.  Excellent hemostasis was maintained.  All the palpable lymphadenopathy was removed with the specimen. I then completed with a antiperistaltic side-to-side anastomosis, utilizing the 55 mm GIA to create a common enterotomy, and subsequent closure of the remaining enterotomy.  Then utilized 3-0 silks to support the staple line anastomosis.  I palpated the common channel to confirm it was more than adequate.  I closed the mesenteric defect with a running 3-0 Vicryl.  Confirmed adequate hemostasis.  Irrigated the abdomen with multiple volumes of sterile saline. We then proceeded with clean closure, and we rescrubbed in with fresh gowns/gloves and drapes. We closed the fascia with running 0 PDS.  Confirmed adequate hemostasis and irrigated the subcutaneous tissues.  Closed the skin with staples.  Dressings were applied to all the incision sites. Overall she appeared to tolerate procedure well.        Ronny Bacon M.D., [facility] 06/15/2020 6:31 PM

## 2020-06-15 NOTE — Anesthesia Preprocedure Evaluation (Signed)
Anesthesia Evaluation  Patient identified by MRN, date of birth, ID band Patient awake    Reviewed: Allergy & Precautions, NPO status , Patient's Chart, lab work & pertinent test results  History of Anesthesia Complications Negative for: history of anesthetic complications  Airway Mallampati: II  TM Distance: <3 FB Neck ROM: Full    Dental no notable dental hx.    Pulmonary neg pulmonary ROS, neg sleep apnea, neg COPD,    breath sounds clear to auscultation- rhonchi (-) wheezing      Cardiovascular hypertension, Pt. on medications (-) angina+ CAD, + Past MI and + CABG (2008)  (-) Cardiac Stents  Rhythm:Regular Rate:Normal - Systolic murmurs and - Diastolic murmurs    Neuro/Psych neg Seizures Anxiety negative neurological ROS     GI/Hepatic negative GI ROS, Neg liver ROS,   Endo/Other  negative endocrine ROSneg diabetes  Renal/GU negative Renal ROS     Musculoskeletal negative musculoskeletal ROS (+)   Abdominal (+) - obese,   Peds  Hematology  (+) anemia ,   Anesthesia Other Findings Past Medical History: No date: Anemia No date: Anxiety No date: Cancer ([place]) No date: Hyperlipidemia No date: Hypertension 2008: MI (myocardial infarction) ([place])     Comment:  triple bypass No date: Osteoporosis No date: Pneumonia     Comment:  as a baby No date: Rheumatic fever   Reproductive/Obstetrics                             Anesthesia Physical Anesthesia Plan  ASA: III  Anesthesia Plan: General   Post-op Pain Management:    Induction: Intravenous  PONV Risk Score and Plan: 2 and Ondansetron and Dexamethasone  Airway Management Planned: Oral ETT  Additional Equipment:   Intra-op Plan:   Post-operative Plan: Extubation in OR  Informed Consent: I have reviewed the patients History and Physical, chart, labs and discussed the procedure including the risks, benefits and alternatives  for the proposed anesthesia with the patient or authorized representative who has indicated his/her understanding and acceptance.     Dental advisory given  Plan Discussed with: CRNA and Anesthesiologist  Anesthesia Plan Comments:         Anesthesia Quick Evaluation

## 2020-06-15 NOTE — Transfer of Care (Signed)
Immediate Anesthesia Transfer of Care Note  Patient: Phyllis Ross  Procedure(s) Performed: XI ROBOT ASSISTED RIGHT COLECTOMY/ CONVERSION TO OPEN COLECTOMY (N/A )  Patient Location: PACU  Anesthesia Type:General  Level of Consciousness: drowsy  Airway & Oxygen Therapy: Patient Spontanous Breathing and Patient connected to face mask oxygen  Post-op Assessment: Report given to RN and Post -op Vital signs reviewed and stable  Post vital signs: Reviewed and stable  Last Vitals:  Vitals Value Taken Time  BP 126/58 06/15/20 1834  Temp    Pulse 73 06/15/20 1836  Resp 16 06/15/20 1836  SpO2 99 % 06/15/20 1836  Vitals shown include unvalidated device data.  Last Pain:  Vitals:   06/15/20 1247  TempSrc: Temporal  PainSc: 0-No pain         Complications: No complications documented.

## 2020-06-16 ENCOUNTER — Encounter: Payer: Self-pay | Admitting: Surgery

## 2020-06-16 LAB — CBC
HCT: 31.8 % — ABNORMAL LOW (ref 36.0–46.0)
Hemoglobin: 9.9 g/dL — ABNORMAL LOW (ref 12.0–15.0)
MCH: 20.3 pg — ABNORMAL LOW (ref 26.0–34.0)
MCHC: 31.1 g/dL (ref 30.0–36.0)
MCV: 65.3 fL — ABNORMAL LOW (ref 80.0–100.0)
Platelets: 264 10*3/uL (ref 150–400)
RBC: 4.87 MIL/uL (ref 3.87–5.11)
RDW: 15.8 % — ABNORMAL HIGH (ref 11.5–15.5)
WBC: 15.4 10*3/uL — ABNORMAL HIGH (ref 4.0–10.5)
nRBC: 0 % (ref 0.0–0.2)

## 2020-06-16 LAB — BASIC METABOLIC PANEL
Anion gap: 7 (ref 5–15)
BUN: 16 mg/dL (ref 8–23)
CO2: 23 mmol/L (ref 22–32)
Calcium: 8.6 mg/dL — ABNORMAL LOW (ref 8.9–10.3)
Chloride: 104 mmol/L (ref 98–111)
Creatinine, Ser: 0.88 mg/dL (ref 0.44–1.00)
GFR, Estimated: >60 mL/min (ref >60)
Glucose, Bld: 138 mg/dL — ABNORMAL HIGH (ref 70–99)
Potassium: 3.8 mmol/L (ref 3.5–5.1)
Sodium: 134 mmol/L — ABNORMAL LOW (ref 135–145)

## 2020-06-16 MED ORDER — PHENOL 1.4 % MT LIQD
1.0000 | OROMUCOSAL | Status: DC | PRN
  Filled 2020-06-16 (×2): qty 177

## 2020-06-16 NOTE — Anesthesia Postprocedure Evaluation (Signed)
Anesthesia Post Note  Patient: Phyllis Ross  Procedure(s) Performed: XI ROBOT ASSISTED RIGHT COLECTOMY/ CONVERSION TO OPEN COLECTOMY (N/A )  Patient location during evaluation: PACU Anesthesia Type: General Level of consciousness: awake and alert Pain management: pain level controlled Vital Signs Assessment: post-procedure vital signs reviewed and stable Respiratory status: spontaneous breathing, nonlabored ventilation and respiratory function stable Cardiovascular status: blood pressure returned to baseline and stable Postop Assessment: no apparent nausea or vomiting Anesthetic complications: no   No complications documented.   Last Vitals:  Vitals:   06/15/20 2253 06/15/20 2319  BP: (!) 172/83 (!) 172/85  Pulse: 84 81  Resp: 20 20  Temp: (!) 36.4 C (!) 36.4 C  SpO2: 98% 96%    Last Pain:  Vitals:   06/16/20 0122  TempSrc:   PainSc: Asleep                 Tera Mater

## 2020-06-16 NOTE — Progress Notes (Addendum)
[facility] Hospital Day(s): 1.   Post op day(s): 1 Day Post-Op.   Interval History:  Patient seen and examined No acute events or new complaints overnight.  Patient reports she is doing okay; having incisional soreness which she rates a 3/10 She does also have a sore throat from NGT; minimal output No fever, chills, nausea, emesis  She does have a leukocytosis to 15.4K; likely reactive Hgb 9.9; likely dilutional  She is maintaining renal function; sCr - 0.88; UO - 1L No significant electrolyte derangements She has remained NPO since OR Has not mobilized  Vital signs in last 24 hours: [min-max] current  Temp:  [97.1 F (36.2 C)-98 F (36.7 C)] 98 F (36.7 C) (03/15 0408) Pulse Rate:  [71-96] 82 (03/15 0408) Resp:  [9-20] 18 (03/15 0408) BP: (118-172)/(58-85) 148/72 (03/15 0408) SpO2:  [92 %-100 %] 97 % (03/15 0408)             Intake/Output last 2 shifts:  03/14 0701 - 03/15 0700 In: 3235.6 [I.V.:2535.6; IV Piggyback:700] Out: 1110 [Urine:1010; Blood:100]   Physical Exam:  Constitutional: alert, cooperative and no distress  HEENT: NGT in place; minimal output Respiratory: breathing non-labored at rest  Cardiovascular: regular rate and sinus rhythm  Gastrointestinal: Soft, incisional soreness, non-distended, no rebound/guarding Genitourinary: Foley in place Integumentary: Laparoscopic and laparotomy incisions covered with dressing   Labs:  CBC Latest Ref Rng & Units 06/16/2020 06/12/2020 04/07/2020  WBC 4.0 - 10.5 K/uL 15.4(H) 8.3 7.9  Hemoglobin 12.0 - 15.0 g/dL 9.9(L) 11.1(L) 10.9(L)  Hematocrit 36.0 - 46.0 % 31.8(L) 36.5 37.3  Platelets 150 - 400 K/uL 264 313 302   CMP Latest Ref Rng & Units 06/16/2020 06/09/2020 04/07/2020  Glucose 70 - 99 mg/dL 138(H) 139(H) 83  BUN 8 - 23 mg/dL 16 16 20   Creatinine 0.44 - 1.00 mg/dL 0.88 0.85 1.01(H)  Sodium 135 - 145 mmol/L 134(L) 141 142  Potassium 3.5 - 5.1 mmol/L 3.8 4.1 4.8  Chloride  98 - 111 mmol/L 104 102 104  CO2 22 - 32 mmol/L 23 28 26   Calcium 8.9 - 10.3 mg/dL 8.6(L) 9.5 9.4  Total Protein 6.5 - 8.1 g/dL - 7.2 6.6  Total Bilirubin 0.3 - 1.2 mg/dL - 0.7 0.5  Alkaline Phos 38 - 126 U/L - 74 84  AST 15 - 41 U/L - 19 15  ALT 0 - 44 U/L - 12 10    Imaging studies: No new pertinent imaging studies   Assessment/Plan:  85 y.o. female doing well awaiting bowel function return 1 Day Post-Op s/p attempted robotic assisted laparoscopic right colectomy converted to open procedure for cecal malignancy.   - Will discontinue NGT  - Initiate CLD  - Continue Entereg until bowel function returns   - Discontinue foley catheter  - Continue IVF resuscitation  - Monitor abdominal examination; on-going bowel function  - Pain control prn; antiemetics prn  - Monitor leukocytosis; likely reactive  - Monitor renal function/electrolytes   - Mobilization encouraged    All of the above findings and recommendations were discussed with the patient, and the medical team, and all of patient's questions were answered to her expressed satisfaction.  -- Edison Simon, PA-C [facility] 06/16/2020, 7:22 AM [phone] M-F: 7am - 4pm

## 2020-06-17 ENCOUNTER — Telehealth: Payer: Self-pay

## 2020-06-17 LAB — CBC
HCT: 29 % — ABNORMAL LOW (ref 36.0–46.0)
Hemoglobin: 9.3 g/dL — ABNORMAL LOW (ref 12.0–15.0)
MCH: 21 pg — ABNORMAL LOW (ref 26.0–34.0)
MCHC: 32.1 g/dL (ref 30.0–36.0)
MCV: 65.5 fL — ABNORMAL LOW (ref 80.0–100.0)
Platelets: 224 10*3/uL (ref 150–400)
RBC: 4.43 MIL/uL (ref 3.87–5.11)
RDW: 15.9 % — ABNORMAL HIGH (ref 11.5–15.5)
WBC: 13.2 10*3/uL — ABNORMAL HIGH (ref 4.0–10.5)
nRBC: 0 % (ref 0.0–0.2)

## 2020-06-17 LAB — BASIC METABOLIC PANEL
Anion gap: 5 (ref 5–15)
BUN: 10 mg/dL (ref 8–23)
CO2: 28 mmol/L (ref 22–32)
Calcium: 8.6 mg/dL — ABNORMAL LOW (ref 8.9–10.3)
Chloride: 107 mmol/L (ref 98–111)
Creatinine, Ser: 0.67 mg/dL (ref 0.44–1.00)
GFR, Estimated: >60 mL/min (ref >60)
Glucose, Bld: 92 mg/dL (ref 70–99)
Potassium: 3.6 mmol/L (ref 3.5–5.1)
Sodium: 140 mmol/L (ref 135–145)

## 2020-06-17 MED ORDER — LISINOPRIL 10 MG PO TABS
10.0000 mg | ORAL_TABLET | Freq: Two times a day (BID) | ORAL | Status: DC
Start: 2020-06-17 — End: 2020-06-26
  Administered 2020-06-17 – 2020-06-26 (×18): 10 mg via ORAL
  Filled 2020-06-17 (×20): qty 1

## 2020-06-17 NOTE — Telephone Encounter (Signed)
error 

## 2020-06-17 NOTE — Progress Notes (Signed)
[facility] Hospital Day(s): 2.   Post op day(s): 2 Days Post-Op.   Interval History:  Patient seen and examined No acute events or new complaints overnight.  Patient reports she is feeling okay, still with abdominal soreness, managed well No fever, chills, nausea, or emesis Likely reactive leukocytosis is improving; now 13.2K Hgb is stable; 9.3 She continues to maintain renal function; sCr - 0.67; UO - 1.2L + unmeasured No electrolyte derangements She is tolerated CLD; having bowel function She is mobilizing  Vital signs in last 24 hours: [min-max] current  Temp:  [97.6 F (36.4 C)-98.3 F (36.8 C)] 98.3 F (36.8 C) (03/16 0439) Pulse Rate:  [57-72] 60 (03/16 0439) Resp:  [16-20] 18 (03/16 0439) BP: (123-165)/(55-82) 146/62 (03/16 0439) SpO2:  [94 %-97 %] 94 % (03/16 0439) Weight:  [54.4 kg] 54.4 kg (03/15 1114)     Height: 5\' 6"  (167.6 cm) Weight: 54.4 kg BMI (Calculated): 19.37   Intake/Output last 2 shifts:  03/15 0701 - 03/16 0700 In: 2515.6 [I.V.:2515.6] Out: 1370 [Urine:1270; Emesis/NG output:100]   Physical Exam:  Constitutional: alert, cooperative and no distress  Respiratory: breathing non-labored at rest  Cardiovascular: regular rate and sinus rhythm  Gastrointestinal: Soft, incisional soreness, non-distended, no rebound/guarding Integumentary: Laparoscopic and laparotomy incisions covered with dressing   Labs:  CBC Latest Ref Rng & Units 06/17/2020 06/16/2020 06/12/2020  WBC 4.0 - 10.5 K/uL 13.2(H) 15.4(H) 8.3  Hemoglobin 12.0 - 15.0 g/dL 9.3(L) 9.9(L) 11.1(L)  Hematocrit 36.0 - 46.0 % 29.0(L) 31.8(L) 36.5  Platelets 150 - 400 K/uL 224 264 313   CMP Latest Ref Rng & Units 06/17/2020 06/16/2020 06/09/2020  Glucose 70 - 99 mg/dL 92 138(H) 139(H)  BUN 8 - 23 mg/dL 10 16 16   Creatinine 0.44 - 1.00 mg/dL 0.67 0.88 0.85  Sodium 135 - 145 mmol/L 140 134(L) 141  Potassium 3.5 - 5.1 mmol/L 3.6 3.8 4.1  Chloride 98 - 111  mmol/L 107 104 102  CO2 22 - 32 mmol/L 28 23 28   Calcium 8.9 - 10.3 mg/dL 8.6(L) 8.6(L) 9.5  Total Protein 6.5 - 8.1 g/dL - - 7.2  Total Bilirubin 0.3 - 1.2 mg/dL - - 0.7  Alkaline Phos 38 - 126 U/L - - 74  AST 15 - 41 U/L - - 19  ALT 0 - 44 U/L - - 12     Imaging studies: No new pertinent imaging studies   Assessment/Plan:  85 y.o. female doing well with return of bowel function 2 Days Post-Op s/p attempted robotic assisted laparoscopic right colectomy converted to open procedure for cecal malignancy.   - Advance diet as tolerated today  - Wean from IVF support as diet advances   - Discontinue Entereg   - Monitor abdominal examination; on-going bowel function             - Pain control prn; antiemetics prn             - Monitor leukocytosis; likely reactive             - Monitor renal function/electrolytes              - Mobilization encouraged    - Restart home medications    - Discharge Planning: If tolerates advances of diet and continues to do well; anticipate may be ready for home in 24-48 hours   All of the above findings and recommendations were discussed with the patient, and the medical team, and all of  patient's questions were answered to her expressed satisfaction.  -- Edison Simon, PA-C [facility] 06/17/2020, 7:17 AM [phone] M-F: 7am - 4pm

## 2020-06-18 ENCOUNTER — Other Ambulatory Visit: Payer: Self-pay | Admitting: Anatomic Pathology & Clinical Pathology

## 2020-06-18 ENCOUNTER — Encounter: Payer: Self-pay | Admitting: Obstetrics and Gynecology

## 2020-06-18 LAB — BASIC METABOLIC PANEL
Anion gap: 7 (ref 5–15)
BUN: 12 mg/dL (ref 8–23)
CO2: 27 mmol/L (ref 22–32)
Calcium: 8.2 mg/dL — ABNORMAL LOW (ref 8.9–10.3)
Chloride: 103 mmol/L (ref 98–111)
Creatinine, Ser: 0.75 mg/dL (ref 0.44–1.00)
GFR, Estimated: >60 mL/min (ref >60)
Glucose, Bld: 93 mg/dL (ref 70–99)
Potassium: 3.6 mmol/L (ref 3.5–5.1)
Sodium: 137 mmol/L (ref 135–145)

## 2020-06-18 LAB — CBC
HCT: 32.3 % — ABNORMAL LOW (ref 36.0–46.0)
Hemoglobin: 10.2 g/dL — ABNORMAL LOW (ref 12.0–15.0)
MCH: 20.7 pg — ABNORMAL LOW (ref 26.0–34.0)
MCHC: 31.6 g/dL (ref 30.0–36.0)
MCV: 65.7 fL — ABNORMAL LOW (ref 80.0–100.0)
Platelets: 243 10*3/uL (ref 150–400)
RBC: 4.92 MIL/uL (ref 3.87–5.11)
RDW: 15.9 % — ABNORMAL HIGH (ref 11.5–15.5)
WBC: 16.7 10*3/uL — ABNORMAL HIGH (ref 4.0–10.5)
nRBC: 0 % (ref 0.0–0.2)

## 2020-06-18 NOTE — Progress Notes (Signed)
[facility] Hospital Day(s): 3.   Post op day(s): 3 Days Post-Op.   Interval History:  Patient seen and examined No acute events or new complaints overnight.  Patient reports she still has expected abdominal soreness, managed with current pain regimen No fever, chills, nausea, emesis Leukocytosis is slightly worse this morning; 16.7K Hgb is stable at 10.2 She is maintaining renal function with sCr - 0.75, UO - 500 ccs No electrolyte derangements She is on soft diet; tolerating well; having bowel function Mobilizing well  Vital signs in last 24 hours: [min-max] current  Temp:  [97.6 F (36.4 C)-98.4 F (36.9 C)] 98.3 F (36.8 C) (03/17 0503) Pulse Rate:  [61-78] 78 (03/17 0503) Resp:  [15-18] 18 (03/17 0503) BP: (114-187)/(53-69) 114/53 (03/17 0503) SpO2:  [95 %-99 %] 99 % (03/17 0503)     Height: 5\' 6"  (167.6 cm) Weight: 54.4 kg BMI (Calculated): 19.37   Intake/Output last 2 shifts:  03/16 0701 - 03/17 0700 In: 600 [P.O.:600] Out: 500 [Urine:500]   Physical Exam:  Constitutional: alert, cooperative and no distress Respiratory: breathing non-labored at rest  Cardiovascular: regular rate and sinus rhythm  Gastrointestinal:Soft, incisional soreness, non-distended, no rebound/guarding Integumentary:Laparoscopic and laparotomy incisions are CDI with staples, early ecchymosis, no erythema or drainage   Labs:  CBC Latest Ref Rng & Units 06/18/2020 06/17/2020 06/16/2020  WBC 4.0 - 10.5 K/uL 16.7(H) 13.2(H) 15.4(H)  Hemoglobin 12.0 - 15.0 g/dL 10.2(L) 9.3(L) 9.9(L)  Hematocrit 36.0 - 46.0 % 32.3(L) 29.0(L) 31.8(L)  Platelets 150 - 400 K/uL 243 224 264   CMP Latest Ref Rng & Units 06/18/2020 06/17/2020 06/16/2020  Glucose 70 - 99 mg/dL 93 92 138(H)  BUN 8 - 23 mg/dL 12 10 16   Creatinine 0.44 - 1.00 mg/dL 0.75 0.67 0.88  Sodium 135 - 145 mmol/L 137 140 134(L)  Potassium 3.5 - 5.1 mmol/L 3.6 3.6 3.8  Chloride 98 - 111 mmol/L 103 107 104   CO2 22 - 32 mmol/L 27 28 23   Calcium 8.9 - 10.3 mg/dL 8.2(L) 8.6(L) 8.6(L)  Total Protein 6.5 - 8.1 g/dL - - -  Total Bilirubin 0.3 - 1.2 mg/dL - - -  Alkaline Phos 38 - 126 U/L - - -  AST 15 - 41 U/L - - -  ALT 0 - 44 U/L - - -     Imaging studies: No new pertinent imaging studies   Assessment/Plan:  85 y.o. female with increasing leukocytosis otherwise doing well 3 Days Post-Op s/p attempted robotic assisted laparoscopic right colectomy converted to open procedurefor cecal malignancy.   - Given her increase in leukocytosis, would like to monitor additional 24 hours to ensure this does not continue to worsen.      - Continue soft diet   - Monitor abdominal examination; on-going bowel function - Pain control prn; antiemetics prn   - Monitor renal function/electrolytes; normal  - Mobilization encouraged             - Continue home medications               - Discharge Planning: Will repeat CBC in morning, if improvement in leukocytosis will anticipate DC   All of the above findings and recommendations were discussed with the patient, and the medical team, and all of patient's questions were answered to her expressed satisfaction.  -- Edison Simon, PA-C [facility] 06/18/2020, 7:20 AM [phone] M-F: 7am - 4pm

## 2020-06-18 NOTE — TOC Initial Note (Signed)
Transition of Care [facility]) - Initial/Assessment Note    Patient Details  Name: Phyllis Ross MRN: [id number] Date of Birth: 08-31-1935  Transition of Care [facility] CM/SW Contact:    Beverly Sessions, RN Phone Number: 06/18/2020, 4:24 PM  Clinical Narrative:                  Patient  3 Days Post-Op s/p attempted robotic assisted laparoscopic right colectomy converted to open procedurefor cecal malignancy.Per report patient incision is closed with staples.  No dressing in place  Patient states that she lives at home with her husband of 78 years PCP [employer] - denies issues obtaining medications   Patient states at baseline she drives her self  Patient states at baseline she is able to drive herself.  Ambulates with a RW  PT has evaluated patient and recommends home health PT.  Patient would like to think about it over night.  TOC to follow up tomorrow   Expected Discharge Plan: [place] Barriers to Discharge: Continued Medical Work up   Patient Goals and CMS Choice        Expected Discharge Plan and Services Expected Discharge Plan: [place]       Living arrangements for the past 2 months: Single Family Home                                      Prior Living Arrangements/Services Living arrangements for the past 2 months: Single Family Home Lives with:: Spouse Patient language and need for interpreter reviewed:: Yes        Need for Family Participation in Patient Care: Yes (Comment) Care giver support system in place?: Yes (comment) Current home services: DME Criminal Activity/Legal Involvement Pertinent to Current Situation/Hospitalization: No - Comment as needed  Activities of Daily Living Home Assistive Devices/Equipment: None ADL Screening (condition at time of admission) Patient's cognitive ability adequate to safely complete daily activities?: Yes Is the patient deaf or have difficulty hearing?:  No Does the patient have difficulty seeing, even when wearing glasses/contacts?: No Does the patient have difficulty concentrating, remembering, or making decisions?: No Patient able to express need for assistance with ADLs?: Yes Does the patient have difficulty dressing or bathing?: No Independently performs ADLs?: Yes (appropriate for developmental age) Does the patient have difficulty walking or climbing stairs?: No Weakness of Legs: None Weakness of Arms/Hands: None  Permission Sought/Granted                  Emotional Assessment       Orientation: : Oriented to Self,Oriented to Place,Oriented to  Time,Oriented to Situation Alcohol / Substance Use: Not Applicable Psych Involvement: No (comment)  Admission diagnosis:  Cecal cancer ([place]) [C18.0] Patient Active Problem List   Diagnosis Date Noted  . 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 Iron deficiency anemia due to chronic blood loss   . Neoplasm of lower gastrointestinal tract   . Acute gastritis with hemorrhage   . Personal history of other malignant neoplasm of skin 01/21/2019  . Atherosclerosis of abdominal aorta ([place]) 12/26/2017  . Age-related osteoporosis without current pathological fracture 06/16/2016  . Encounter for follow-up surveillance of ovarian cancer 05/20/2015  . 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  . Wedging of vertebra ([place]) 10/13/2014  . Polypharmacy 10/13/2014  . Encounter for general adult medical examination without abnormal findings 10/13/2014  .  Anxiety 10/13/2014  . Essential hypertension 08/27/2014  . Bradycardia 08/27/2014  . Coronary artery disease of native heart with stable angina pectoris ([place]) 08/27/2014  . MI (mitral incompetence) 08/27/2014  . TI (tricuspid incompetence) 08/27/2014  . Combined fat and carbohydrate induced hyperlipemia 08/13/2014  . HTN (hypertension), malignant 08/11/2014  . Chest pain 08/11/2014  . CAD (coronary artery disease) of artery  bypass graft 08/11/2014   PCP:  Juline Patch, MD Pharmacy:   [facility], [place] - [address] [place] [address] Phone: [phone] Fax: [phone]     Social Determinants of Health (SDOH) Interventions    Readmission Risk Interventions Readmission Risk Prevention Plan 06/18/2020  Medication Screening Complete  Transportation Screening Complete  Some recent data might be hidden

## 2020-06-18 NOTE — Care Management Important Message (Signed)
Important Message  Patient Details  Name: Phyllis Ross MRN: [id number] Date of Birth: Dec 30, 1935   Medicare Important Message Given:  Yes     Dannette Barbara 06/18/2020, 1:57 PM

## 2020-06-18 NOTE — Progress Notes (Signed)
Order received from Otho Ket PA for PT

## 2020-06-18 NOTE — Evaluation (Signed)
Physical Therapy Evaluation Patient Details Name: Phyllis Ross MRN: [id number] DOB: 1936-02-21 Today's Date: 06/18/2020   History of Present Illness  admitted for acute hospitalization s/p open R colectomy due to cecal malignancy (06/15/20)  Clinical Impression  Upon evaluation, patient alert and oriented; follows commands and agreeable to session.  Rates pain as mild (FACES 2/10) and declines need for pain medication at this time.  Bilat UE/LE strength and ROM grossly symmetrical and WFL; no focal weakness appreciated.  Able to complete bed mobility with min assist (step by step cuing for log rolling); sit/stand, basic transfers and gait (200') with RW, cga/close sup.  Demonstrates forward flexed posture, RW arms length anterior to patient (both slightly improved with consistent verbal cuing); fair cadence with good step height/length.  Minimal reports of pain with gait/mobility; prefers to maintain RW at this point. Would benefit from skilled PT to address above deficits and promote optimal return to PLOF.; Recommend transition to HHPT upon discharge from acute hospitalization.     Follow Up Recommendations Home health PT    Equipment Recommendations   (has RW)    Recommendations for Other Services       Precautions / Restrictions Precautions Precautions: Fall Precaution Comments: abdominal binder Restrictions Weight Bearing Restrictions: No      Mobility  Bed Mobility Overal bed mobility: Needs Assistance Bed Mobility: Supine to Sit     Supine to sit: Min assist     General bed mobility comments: step by step cuing for log rolling technique    Transfers Overall transfer level: Needs assistance Equipment used: Rolling walker (2 wheeled) Transfers: Sit to/from Stand Sit to Stand: Min guard;Supervision            Ambulation/Gait Ambulation/Gait assistance: Min Gaffer (Feet): 200 Feet Assistive device: Rolling walker (2 wheeled)        General Gait Details: forward flexed posture, RW arms length anterior to patient (both slightly improved with consistent verbal cuing); fair cadence with good step height/length.  Minimal reports of pain with gait/mobility; prefers to maintain RW at this point.  Stairs            Wheelchair Mobility    Modified Rankin (Stroke Patients Only)       Balance Overall balance assessment: Needs assistance Sitting-balance support: No upper extremity supported;Feet supported Sitting balance-Leahy Scale: Good     Standing balance support: No upper extremity supported Standing balance-Leahy Scale: Fair                               Pertinent Vitals/Pain Pain Assessment: Faces Faces Pain Scale: Hurts a little bit Pain Location: abdomen Pain Descriptors / Indicators: Aching;Grimacing;Dull Pain Intervention(s): Limited activity within patient's tolerance;Monitored during session;Repositioned    Home Living Family/patient expects to be discharged to:: Private residence Living Arrangements: Spouse/significant other Available Help at Discharge: Family;Available 24 hours/day Type of Home: House Home Access: Stairs to enter Entrance Stairs-Rails: Right Entrance Stairs-Number of Steps: 1-2 Home Layout: One level Home Equipment: Walker - 2 wheels      Prior Function Level of Independence: Independent         Comments: Indep with ADLs, household and community mobilization without assist device; denies fall history.  Very active, enjoys golfing, pickleball and several other sports     Hand Dominance        Extremity/Trunk Assessment   Upper Extremity Assessment Upper Extremity Assessment: Overall WFL for tasks assessed  Lower Extremity Assessment Lower Extremity Assessment: Overall WFL for tasks assessed       Communication   Communication: No difficulties  Cognition Arousal/Alertness: Awake/alert Behavior During Therapy: WFL for tasks  assessed/performed Overall Cognitive Status: Within Functional Limits for tasks assessed                                        General Comments      Exercises Other Exercises Other Exercises: Educated in role of PT and progressive mobility; educated in transfer mechanics and safe use of RW; educated in log rolling technique.  Patient voiced understanding of all information; will continue to reinforce as needed.   Assessment/Plan    PT Assessment Patient needs continued PT services  PT Problem List Decreased activity tolerance;Decreased balance;Decreased mobility;Decreased skin integrity;Pain       PT Treatment Interventions DME instruction;Gait training;Stair training;Functional mobility training;Balance training;Patient/family education;Therapeutic activities;Therapeutic exercise    PT Goals (Current goals can be found in the Care Plan section)  Acute Rehab PT Goals Patient Stated Goal: to go home tomorrow! PT Goal Formulation: With patient Time For Goal Achievement: 07/02/20 Potential to Achieve Goals: Good    Frequency Min 2X/week   Barriers to discharge        Co-evaluation               AM-PAC PT "6 Clicks" Mobility  Outcome Measure Help needed turning from your back to your side while in a flat bed without using bedrails?: None Help needed moving from lying on your back to sitting on the side of a flat bed without using bedrails?: None Help needed moving to and from a bed to a chair (including a wheelchair)?: None Help needed standing up from a chair using your arms (e.g., wheelchair or bedside chair)?: None Help needed to walk in hospital room?: A Little Help needed climbing 3-5 steps with a railing? : A Little 6 Click Score: 22    End of Session Equipment Utilized During Treatment: Gait belt Activity Tolerance: Patient tolerated treatment well Patient left: in chair;with call bell/phone within reach;with chair alarm set Nurse  Communication: Mobility status PT Visit Diagnosis: Muscle weakness (generalized) (M62.81);Difficulty in walking, not elsewhere classified (R26.2)    Time: 7741-2878 PT Time Calculation (min) (ACUTE ONLY): 20 min   Charges:   PT Evaluation $PT Eval Moderate Complexity: 1 Mod PT Treatments $Therapeutic Activity: 8-22 mins       Kristen H. Owens Shark, PT, DPT, NCS 06/18/20, 3:45 PM [phone]

## 2020-06-19 ENCOUNTER — Encounter: Payer: Self-pay | Admitting: Surgery

## 2020-06-19 ENCOUNTER — Inpatient Hospital Stay: Payer: Medicare PPO

## 2020-06-19 LAB — CBC
HCT: 31.2 % — ABNORMAL LOW (ref 36.0–46.0)
Hemoglobin: 9.8 g/dL — ABNORMAL LOW (ref 12.0–15.0)
MCH: 20.5 pg — ABNORMAL LOW (ref 26.0–34.0)
MCHC: 31.4 g/dL (ref 30.0–36.0)
MCV: 65.1 fL — ABNORMAL LOW (ref 80.0–100.0)
Platelets: 262 10*3/uL (ref 150–400)
RBC: 4.79 MIL/uL (ref 3.87–5.11)
RDW: 15.9 % — ABNORMAL HIGH (ref 11.5–15.5)
WBC: 18.1 10*3/uL — ABNORMAL HIGH (ref 4.0–10.5)
nRBC: 0 % (ref 0.0–0.2)

## 2020-06-19 LAB — COMPREHENSIVE METABOLIC PANEL
ALT: 10 U/L (ref 0–44)
AST: 18 U/L (ref 15–41)
Albumin: 2.7 g/dL — ABNORMAL LOW (ref 3.5–5.0)
Alkaline Phosphatase: 62 U/L (ref 38–126)
Anion gap: 7 (ref 5–15)
BUN: 15 mg/dL (ref 8–23)
CO2: 28 mmol/L (ref 22–32)
Calcium: 8.3 mg/dL — ABNORMAL LOW (ref 8.9–10.3)
Chloride: 102 mmol/L (ref 98–111)
Creatinine, Ser: 0.62 mg/dL (ref 0.44–1.00)
GFR, Estimated: >60 mL/min (ref >60)
Glucose, Bld: 113 mg/dL — ABNORMAL HIGH (ref 70–99)
Potassium: 3.3 mmol/L — ABNORMAL LOW (ref 3.5–5.1)
Sodium: 137 mmol/L (ref 135–145)
Total Bilirubin: 1.7 mg/dL — ABNORMAL HIGH (ref 0.3–1.2)
Total Protein: 5.7 g/dL — ABNORMAL LOW (ref 6.5–8.1)

## 2020-06-19 MED ORDER — SODIUM CHLORIDE 0.9 % IV SOLN: INTRAVENOUS | Status: DC

## 2020-06-19 MED ORDER — FENTANYL CITRATE (PF) 100 MCG/2ML IJ SOLN
INTRAMUSCULAR | Status: AC | PRN
  Administered 2020-06-19 (×3): 25 ug via INTRAVENOUS

## 2020-06-19 MED ORDER — POTASSIUM CHLORIDE CRYS ER 20 MEQ PO TBCR
20.0000 meq | EXTENDED_RELEASE_TABLET | Freq: Two times a day (BID) | ORAL | Status: DC
  Administered 2020-06-19 – 2020-06-25 (×11): 20 meq via ORAL
  Filled 2020-06-19 (×13): qty 1

## 2020-06-19 MED ORDER — POTASSIUM CHLORIDE 2 MEQ/ML IV SOLN
INTRAVENOUS | Status: DC
  Filled 2020-06-19 (×8): qty 1000

## 2020-06-19 MED ORDER — MIDAZOLAM HCL 2 MG/2ML IJ SOLN
INTRAMUSCULAR | Status: AC | PRN
  Administered 2020-06-19: 1 mg via INTRAVENOUS
  Administered 2020-06-19: 0.5 mg via INTRAVENOUS

## 2020-06-19 MED ORDER — ALUM & MAG HYDROXIDE-SIMETH 200-200-20 MG/5ML PO SUSP
30.0000 mL | Freq: Four times a day (QID) | ORAL | Status: DC | PRN
  Administered 2020-06-19 – 2020-06-24 (×3): 30 mL via ORAL
  Filled 2020-06-19 (×3): qty 30

## 2020-06-19 MED ORDER — FENTANYL CITRATE (PF) 100 MCG/2ML IJ SOLN
INTRAMUSCULAR | Status: AC
  Filled 2020-06-19: qty 2

## 2020-06-19 MED ORDER — KCL-LACTATED RINGERS 20 MEQ/L IV SOLN
INTRAVENOUS | Status: DC
  Filled 2020-06-19 (×4): qty 1000

## 2020-06-19 MED ORDER — IOHEXOL 9 MG/ML PO SOLN
500.0000 mL | ORAL | Status: AC
  Administered 2020-06-19 (×2): 500 mL via ORAL

## 2020-06-19 MED ORDER — SODIUM CHLORIDE 0.9% FLUSH
5.0000 mL | Freq: Three times a day (TID) | INTRAVENOUS | Status: DC
  Administered 2020-06-19 – 2020-06-26 (×21): 5 mL

## 2020-06-19 MED ORDER — IOHEXOL 300 MG/ML  SOLN
75.0000 mL | Freq: Once | INTRAMUSCULAR | Status: AC | PRN
  Administered 2020-06-19: 75 mL via INTRAVENOUS

## 2020-06-19 MED ORDER — MIDAZOLAM HCL 2 MG/2ML IJ SOLN
INTRAMUSCULAR | Status: AC
  Filled 2020-06-19: qty 4

## 2020-06-19 MED ORDER — SODIUM CHLORIDE 0.9 % IV SOLN
INTRAVENOUS | Status: DC | PRN
  Administered 2020-06-19 – 2020-06-22 (×3): 1000 mL via INTRAVENOUS

## 2020-06-19 MED ORDER — PIPERACILLIN-TAZOBACTAM 3.375 G IVPB
3.3750 g | Freq: Three times a day (TID) | INTRAVENOUS | Status: DC
  Administered 2020-06-19 – 2020-06-26 (×21): 3.375 g via INTRAVENOUS
  Filled 2020-06-19 (×21): qty 50

## 2020-06-19 NOTE — Progress Notes (Signed)
PT Cancellation Note  Patient Details Name: Phyllis Ross MRN: [id number] DOB: [birth year]   Cancelled Treatment:   Pt off floor for Abdominal CT.  Will continue PT per POC next session.  Josie Dixon 06/19/2020, 3:15 PM

## 2020-06-19 NOTE — Consult Note (Signed)
Chief Complaint: Patient was seen in consultation today for postoperative fluid collections.  Referring Physician(s): Edison Simon  Patient Status: Phyllis Ross - In-pt  History of Present Illness: Phyllis Ross is a 85 y.o. female with recent diagnosis of colon cancer and underwent a right hemicolectomy on 06/15/2020.  Patient has increasing white blood cell count since the surgery and underwent CT of the abdomen and pelvis earlier today.  CT demonstrates free air along with multiple fluid collections including a large amount of pelvic fluid.  Surgery is concerned about a bowel anastomosis leak.  IR has been consulted for image guided aspiration or drainage of the intra-abdominal fluid.  Patient complains of right abdominal pain with palpation.  Patient has had diarrhea throughout the day.  Past Medical History:  Diagnosis Date  . Anemia   . Anxiety   . Cancer ([place])   . Hyperlipidemia   . Hypertension   . MI (myocardial infarction) ([place]) 2008   triple bypass  . Osteoporosis   . Pneumonia    as a baby  . Rheumatic fever     Past Surgical History:  Procedure Laterality Date  . COLONOSCOPY  2011   normal- Dr Vira Agar  . COLONOSCOPY WITH PROPOFOL N/A 06/04/2020   Procedure: COLONOSCOPY WITH PROPOFOL;  Surgeon: Lucilla Lame, MD;  Location: [facility];  Service: Endoscopy;  Laterality: N/A;  . CORONARY ARTERY BYPASS GRAFT    . ESOPHAGOGASTRODUODENOSCOPY (EGD) WITH PROPOFOL N/A 06/04/2020   Procedure: ESOPHAGOGASTRODUODENOSCOPY (EGD) WITH PROPOFOL;  Surgeon: Lucilla Lame, MD;  Location: [facility];  Service: Endoscopy;  Laterality: N/A;  . VAGINAL HYSTERECTOMY     DUB    Allergies: Patient has no known allergies.  Medications: Prior to Admission medications   Medication Sig Start Date End Date Taking? Authorizing Provider  ALPRAZolam Duanne Moron) 0.25 MG tablet Take 0.25 mg by mouth daily as needed for anxiety.   Yes [provider]  aspirin EC 81 MG  tablet Take 81 mg by mouth daily. Swallow whole.   Yes [provider]  atorvastatin (LIPITOR) 80 MG tablet 1 tablet daily Patient taking differently: Take 80 mg by mouth daily. 04/07/20  Yes Juline Patch, MD  erythromycin base (E-MYCIN) 500 MG tablet Take 1 tablet (500 mg total) by mouth as needed. Take 2 Erythromycin tablets at 8:00 am, again at 2:00 pm and again at 8:00 pm the day before your surgery. 06/09/20  Yes Ronny Bacon, MD  ferrous sulfate 325 (65 FE) MG tablet Take 325 mg by mouth daily with breakfast.   Yes [provider]  lisinopril (ZESTRIL) 10 MG tablet TAKE (1) TABLET BY MOUTH TWICE DAILY Patient taking differently: Take 10 mg by mouth 2 (two) times daily. 04/07/20  Yes Juline Patch, MD  neomycin (MYCIFRADIN) 500 MG tablet Take 1 tablet (500 mg total) by mouth as needed. Take 2 Neomycin tablets at 8:00 am and again at 2:00 pm  And again at 8:00pm. 06/09/20  Yes Ronny Bacon, MD  Sodium Sulfate-Mag Sulfate-KCl (SUTAB) [phone] MG TABS Take 376 mg by mouth as directed. 05/26/20  Yes Lucilla Lame, MD     Family History  Problem Relation Age of Onset  . Heart attack Mother   . Heart attack Maternal Uncle   . Heart attack Maternal Uncle     Social History   Socioeconomic History  . Marital status: Married    Spouse name: Not on file  . Number of children: 2  . Years of education:  Not on file  . Highest education level: Some college, no degree  Occupational History    Employer: RETIRED  Tobacco Use  . Smoking status: Never Smoker  . Smokeless tobacco: Never Used  Vaping Use  . Vaping Use: Never used  Substance and Sexual Activity  . Alcohol use: No  . Drug use: No  . Sexual activity: Yes  Other Topics Concern  . Not on file  Social History Narrative  . Not on file   Social Determinants of Health   Financial Resource Strain: Low Risk   . Difficulty of Paying Living Expenses: Not hard at all  Food Insecurity: No Food Insecurity  .  Worried About Charity fundraiser in the Last Year: Never true  . Ran Out of Food in the Last Year: Never true  Transportation Needs: No Transportation Needs  . Lack of Transportation (Medical): No  . Lack of Transportation (Non-Medical): No  Physical Activity: Sufficiently Active  . Days of Exercise per Week: 6 days  . Minutes of Exercise per Session: 40 min  Stress: No Stress Concern Present  . Feeling of Stress : Not at all  Social Connections: Socially Integrated  . Frequency of Communication with Friends and Family: More than three times a week  . Frequency of Social Gatherings with Friends and Family: More than three times a week  . Attends Religious Services: More than 4 times per year  . Active Member of Clubs or Organizations: Yes  . Attends Archivist Meetings: More than 4 times per year  . Marital Status: Married    Review of Systems  Gastrointestinal: Positive for abdominal pain and diarrhea.    Vital Signs: BP 138/62 (BP Location: Left Arm)   Pulse 75   Temp 98.2 F (36.8 C) (Oral)   Resp 18   Ht 5\' 6"  (1.676 m)   Wt 54.4 kg   SpO2 98%   BMI 19.36 kg/m   Physical Exam Cardiovascular:     Rate and Rhythm: Normal rate and regular rhythm.     Heart sounds: Normal heart sounds.  Pulmonary:     Effort: Pulmonary effort is normal.     Breath sounds: Normal breath sounds.  Abdominal:     General: Abdomen is flat.     Tenderness: There is abdominal tenderness.     Comments: Tender to palpation in the right abdomen.  Surgical staples in the lower abdominal midline.     Imaging: CT ABDOMEN PELVIS W CONTRAST  Result Date: 06/19/2020 CLINICAL DATA:  Abdominal abscess/infection suspected. Postoperative day 4 status post right colectomy. EXAM: CT ABDOMEN AND PELVIS WITH CONTRAST TECHNIQUE: Multidetector CT imaging of the abdomen and pelvis was performed using the standard protocol following bolus administration of intravenous contrast. CONTRAST:  38mL  OMNIPAQUE IOHEXOL 300 MG/ML  SOLN COMPARISON:  06/10/2020. FINDINGS: Lower chest: Subcutaneous emphysema noted lower chest wall. Hepatobiliary: No suspicious focal abnormality within the liver parenchyma. Gallbladder is nondistended and appears contracted in its mid segment, similar to prior. No intrahepatic or extrahepatic biliary dilation. Pancreas: No focal mass lesion. No dilatation of the main duct. No intraparenchymal cyst. No peripancreatic edema. Spleen: No splenomegaly. No focal mass lesion. Adrenals/Urinary Tract: No adrenal nodule or mass. 0.9 x 1.0 cm nonobstructing stone is identified in the right renal pelvis. 6.5 cm simple cyst noted interpolar left kidney. No evidence for hydroureter. The urinary bladder appears normal for the degree of distention. Gas in the bladder lumen suggests recent instrumentation. There is  some dependent contrast material in the bladder lumen is well. Stomach/Bowel: Stomach is distended with contrast material. Duodenum is normally positioned as is the ligament of Treitz. No small bowel wall thickening. No small bowel dilatation. Status post right hemicolectomy. Contrast material is seen tracking through the ileocolic anastomosis. Colonic lumen is opacified down to the level of the rectum. Colon appears to be fluid-filled to the level of the rectum suggesting diarrhea. Vascular/Lymphatic: There is abdominal aortic atherosclerosis without aneurysm. 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 There is no gastrohepatic or hepatoduodenal ligament lymphadenopathy. No retroperitoneal or mesenteric lymphadenopathy. No pelvic sidewall lymphadenopathy. Reproductive: The uterus is surgically absent. There is no adnexal mass. Other: 2.5 x 1.7 cm fluid collection is identified just lateral to the anastomosis along the wall of the colon. This shows a thin rim of enhancement (axial image 50 of series 2). A second loculated fluid collection is identified just inferior to the  gallbladder fossa measuring 6.1 x 2.8 x 3.1 Cm on axial image 42 of series 2 and sagittal image 36/6 today. This collection also shows a thin rim of subtle enhancement and contains gas locules not distributed along the non dependent wall. A third loculated collections identified in the anterior abdomen along the tip of the lateral segment left liver and anterior to the mid stomach just deep to the rectus sheath, measuring approximately 6.2 x 1.4 x 6.0 cm (axial 37/2 and sagittal 70/6. There is peritoneal enhancement in the right lower quadrant with small to moderate free fluid in the central pelvis. Scattered small volume intraperitoneal free air is not unexpected on postoperative day 4. Fairly diffuse gas tracking through the subcutaneous fat of the anterior and right greater than left lateral abdominal wall is also compatible with recent surgery Musculoskeletal: Degenerative changes noted in the hips bilaterally. IMPRESSION: 1. Status post right hemicolectomy with ileocolic anastomosis. Contrast material is seen tracking through the ileocolic anastomosis. 2. Three loculated fluid collections are identified in the anterior abdomen, as described above. Each has a thin rim of subtle enhancement. While postoperative day 4 is on the early side for abscess formation, imaging features are certainly suspicious for superinfection of these collections and evolving abscess formation 3. Small to moderate volume free fluid in the pelvis. 4. Scattered small volume intraperitoneal free air and air tracking in the abdominal wall is not unexpected on postoperative day. 5. Gas in the bladder lumen suggests recent instrumentation. 6. Colon appears to be fluid-filled to the level of the rectum suggesting diarrhea. 7. 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 8. 0.9 x 1.0 cm nonobstructing stone in the right renal pelvis. 9. 6.5 cm simple cyst interpolar left kidney. 10. Aortic Atherosclerosis (ICD10-I70.0).  Electronically Signed   By: Misty Stanley M.D.   On: 06/19/2020 11:12   CT Abdomen Pelvis W Contrast  Result Date: 06/12/2020 CLINICAL DATA:  Recently diagnosed cecal mass, history of ovarian cancer EXAM: CT ABDOMEN AND PELVIS WITH CONTRAST TECHNIQUE: Multidetector CT imaging of the abdomen and pelvis was performed using the standard protocol following bolus administration of intravenous contrast. CONTRAST:  56mL OMNIPAQUE IOHEXOL 300 MG/ML  SOLN COMPARISON:  04/25/2007 FINDINGS: Lower chest: Calcified granuloma at the right lung base. Emphysematous changes. Hepatobiliary: Liver is within normal limits. Gallbladder is unremarkable. No intrahepatic or extrahepatic ductal dilatation. Pancreas: Within normal limits. Spleen: Within normal limits. Adrenals/Urinary Tract: Adrenal glands are within normal limits. Bilateral renal cysts, measuring up to 6.9 cm in the lateral interpolar  left kidney (series 2/image 32), benign (Bosniak I). No hydronephrosis. Bladder is within normal limits. Stomach/Bowel: Stomach is within normal limits. No evidence of bowel obstruction. Appendix is not discretely visualized. 4.7 x 5.7 x 5.3 cm cecal mass (series 2/image 51), suspicious for colonic neoplasm such as adenocarcinoma. Vascular/Lymphatic: No evidence of abdominal aortic aneurysm. Atherosclerotic calcifications of the abdominal aorta and branch vessels. Para-aortic surgical clips, unchanged from 2009. Ileocolic nodes in the right mid abdomen measuring up to 2.0 cm short axis (series 2/image 48). Additional smaller nodes in this region (series 2/images 43 and 45). Reproductive: Status post hysterectomy. No adnexal masses. Other: No abdominopelvic ascites. Musculoskeletal: Mild degenerative changes of the visualized thoracolumbar spine, most prominent at L5-S1. IMPRESSION: 5.7 cm cecal mass, suspicious for colonic neoplasm such as adenocarcinoma. Associated ileocolic nodal metastases measuring up to 2.0 cm short axis. Additional  ancillary findings as above. Electronically Signed   By: Julian Hy M.D.   On: 06/12/2020 06:26    Labs:  CBC: Recent Labs    06/16/20 0455 06/17/20 0457 06/18/20 0441 06/19/20 0448  WBC 15.4* 13.2* 16.7* 18.1*  HGB 9.9* 9.3* 10.2* 9.8*  HCT 31.8* 29.0* 32.3* 31.2*  PLT 264 224 243 262    COAGS: No results for input(s): INR, APTT in the last 8760 hours.  BMP: Recent Labs    04/07/20 0914 06/09/20 1414 06/16/20 0455 06/17/20 0457 06/18/20 0441 06/19/20 0448  NA 142   < > 134* 140 137 137  K 4.8   < > 3.8 3.6 3.6 3.3*  CL 104   < > 104 107 103 102  CO2 26   < > 23 28 27 28   GLUCOSE 83   < > 138* 92 93 113*  BUN 20   < > 16 10 12 15   CALCIUM 9.4   < > 8.6* 8.6* 8.2* 8.3*  CREATININE 1.01*   < > 0.88 0.67 0.75 0.62  GFRNONAA 52*   < > >60 >60 >60 >60  GFRAA 60  --   --   --   --   --    < > = values in this interval not displayed.    LIVER FUNCTION TESTS: Recent Labs    01/29/20 0000 04/07/20 0914 06/09/20 1414 06/19/20 0448  BILITOT  --  0.5 0.7 1.7*  AST 17 15 19 18   ALT 10 10 12 10   ALKPHOS 90 84 74 62  PROT  --  6.6 7.2 5.7*  ALBUMIN 3.8 3.9 3.8 2.7*    TUMOR MARKERS: No results for input(s): AFPTM, CEA, CA199, CHROMGRNA in the last 8760 hours.  Assessment and Plan:  85 year old with elevated white blood cell count and postoperative fluid collections following a right hemicolectomy on 06/15/2020.  Fluid collections need to be sampled and possibly drained due to the concern for postoperative bowel leak.  Patient has multiple small loculated collections in the abdomen and a large amount of free fluid in the pelvis.  Plan to sample the most suspicious fluid collection in the right abdomen which contains gas.  Depending on the appearance of the fluid, will place drain in this collection and may target additional fluid collections including the pelvis.  Patient was consented for CT-guided aspiration and drainage of multiple fluid collections.  Risks  and benefits discussed with the patient including bleeding, infection, damage to adjacent structures, bowel perforation/fistula connection, and sepsis.  All of the patient's questions were answered, patient is agreeable to proceed. Consent signed and in chart.  Electronically Signed: Burman Riis, MD 06/19/2020, 1:52 PM

## 2020-06-19 NOTE — Progress Notes (Addendum)
Progress Note Reviewed CT Abdomen/Pelvis which is concerning for fairly significant pneumoperitoneum and multiple fluid collections in the abdomen. She did have PO contrast and this does transverse the antonomasias. However, this is concerning for anastomotic leak vs multiple intra-abdominal abscesses.   Radiologist report reviewed below:  IMPRESSION: 1. Status post right hemicolectomy with ileocolic anastomosis. Contrast material is seen tracking through the ileocolic anastomosis. 2. Three loculated fluid collections are identified in the anterior abdomen, as described above. Each has a thin rim of subtle enhancement. While postoperative day 4 is on the early side for abscess formation, imaging features are certainly suspicious for superinfection of these collections and evolving abscess formation 3. Small to moderate volume free fluid in the pelvis. 4. Scattered small volume intraperitoneal free air and air tracking in the abdominal wall is not unexpected on postoperative day. 5. Gas in the bladder lumen suggests recent instrumentation. 6. Colon appears to be fluid-filled to the level of the rectum suggesting diarrhea. 7. 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 8. 0.9 x 1.0 cm nonobstructing stone in the right renal pelvis. 9. 6.5 cm simple cyst interpolar left kidney. 10. Aortic Atherosclerosis (ICD10-I70.0).   I discussed findings on the phone with Dr Anselm Pancoast (IR), who is in agreement with aspirating +/- placing percutaneous drain in these fluid collections. Greatly appreciate assistance.  Plan:  -- NPO -- Restart IVF -- Start IV Zosyn -- Plan for IR aspiration +/- drainage. If this is succus/eneteric, we will need to consider more urgent surgical interventions.   D/W Dr Christian Mate as well  -- Edison Simon, PA-C [facility] 06/19/2020, 12:27 PM [phone] M-F: 7am - 4pm

## 2020-06-19 NOTE — Procedures (Signed)
Interventional Radiology Procedure:   Indications: Post operative fluid collections with elevated WBC  Procedure: CT guided drain placement in right abdominal fluid collection and CT guided drain placement in pelvic fluid  Findings: 1)  Yellow brown fluid removed from right abdominal collection and placed 8 Fr drain.   Suggestive for a bowel leak.   2) Red serous fluid removed from pelvis and placed 10 Fr drain.  Complications: None     EBL: less than 10 ml  Plan: Fluids sent for gram stain and culture.    Romelle Reiley R. Anselm Pancoast, MD  Pager: [phone]

## 2020-06-19 NOTE — Progress Notes (Signed)
[facility] Hospital Day(s): 4.   Post op day(s): 4 Days Post-Op.   Interval History:  Patient seen and examined No acute events or new complaints overnight.  Patient reports she continues to do well Her abdominal soreness is improving No fever, chills, nausea, emesis, cough, SOB, or urinary symptoms Unfortunately, her leukocytosis continues to worsen; now up to 18.1 Mild hyperkalemia to 3.1 She otherwise continues to tolerate regular diet without issues Normal bowel function Mobilizing well   Vital signs in last 24 hours: [min-max] current  Temp:  [98.1 F (36.7 C)-99.3 F (37.4 C)] 98.1 F (36.7 C) (03/18 0422) Pulse Rate:  [72-83] 76 (03/18 0433) Resp:  [14-20] 14 (03/18 0422) BP: (110-176)/(54-73) 176/71 (03/18 0433) SpO2:  [95 %-97 %] 97 % (03/18 0422)     Height: 5\' 6"  (167.6 cm) Weight: 54.4 kg BMI (Calculated): 19.37   Intake/Output last 2 shifts:  03/17 0701 - 03/18 0700 In: 360 [P.O.:360] Out: 150 [Urine:150]   Physical Exam:  Constitutional: alert, cooperative and no distress Respiratory: breathing non-labored at rest  Cardiovascular: regular rate and sinus rhythm  Gastrointestinal:Soft, incisional soreness, non-distended, no rebound/guarding Integumentary:Laparoscopic and laparotomy incisions are CDI with staples, early ecchymosis, no erythema or drainage   Labs:  CBC Latest Ref Rng & Units 06/19/2020 06/18/2020 06/17/2020  WBC 4.0 - 10.5 K/uL 18.1(H) 16.7(H) 13.2(H)  Hemoglobin 12.0 - 15.0 g/dL 9.8(L) 10.2(L) 9.3(L)  Hematocrit 36.0 - 46.0 % 31.2(L) 32.3(L) 29.0(L)  Platelets 150 - 400 K/uL 262 243 224   CMP Latest Ref Rng & Units 06/19/2020 06/18/2020 06/17/2020  Glucose 70 - 99 mg/dL 113(H) 93 92  BUN 8 - 23 mg/dL 15 12 10   Creatinine 0.44 - 1.00 mg/dL 0.62 0.75 0.67  Sodium 135 - 145 mmol/L 137 137 140  Potassium 3.5 - 5.1 mmol/L 3.3(L) 3.6 3.6  Chloride 98 - 111 mmol/L 102 103 107  CO2 22 - 32 mmol/L 28 27  28   Calcium 8.9 - 10.3 mg/dL 8.3(L) 8.2(L) 8.6(L)  Total Protein 6.5 - 8.1 g/dL 5.7(L) - -  Total Bilirubin 0.3 - 1.2 mg/dL 1.7(H) - -  Alkaline Phos 38 - 126 U/L 62 - -  AST 15 - 41 U/L 18 - -  ALT 0 - 44 U/L 10 - -     Imaging studies: No new pertinent imaging studies, CT Abdomen/pelvis pending   Assessment/Plan:  85 y.o. female with increasing leukocytosis otherwise doing well 4 Days Post-Op s/p attempted robotic assisted laparoscopic right colectomy converted to open procedurefor cecal malignancy.   - Given her persistent increase in leukocytosis over the last 48 hours, I will obtain CT Abdomen/pelvis this morning to rule out intra-abdominal source. She is otherwise asymptomatic without obvious source.     - Continue soft diet              - Monitor abdominal examination; on-going bowel function - Pain control prn; antiemetics prn              - Monitor renal function/electrolytes; normal  - Mobilization encouraged - Continue home medications    - Discharge Planning: Pending CT Abdomen/pelvis   All of the above findings and recommendations were discussed with the patient, and the medical team, and all of patient's questions were answered to her expressed satisfaction.  -- Edison Simon, PA-C [facility] 06/19/2020, 7:32 AM [phone] M-F: 7am - 4pm

## 2020-06-19 NOTE — Progress Notes (Signed)
PT Cancellation Note  Patient Details Name: Phyllis Ross MRN: [id number] DOB: Nov 10, 1935   Cancelled Treatment:    Reason Eval/Treat Not Completed: Patient at procedure or test/unavailable   Pt off unit for procedure.  Will continue as appropriate.   Chesley Noon 06/19/2020, 3:11 PM

## 2020-06-19 NOTE — Progress Notes (Signed)
Recovery started in CT and completed inpt room

## 2020-06-20 LAB — CBC
HCT: 28.8 % — ABNORMAL LOW (ref 36.0–46.0)
Hemoglobin: 9.3 g/dL — ABNORMAL LOW (ref 12.0–15.0)
MCH: 20.9 pg — ABNORMAL LOW (ref 26.0–34.0)
MCHC: 32.3 g/dL (ref 30.0–36.0)
MCV: 64.6 fL — ABNORMAL LOW (ref 80.0–100.0)
Platelets: 270 10*3/uL (ref 150–400)
RBC: 4.46 MIL/uL (ref 3.87–5.11)
RDW: 15.6 % — ABNORMAL HIGH (ref 11.5–15.5)
WBC: 15.5 10*3/uL — ABNORMAL HIGH (ref 4.0–10.5)
nRBC: 0 % (ref 0.0–0.2)

## 2020-06-20 LAB — BASIC METABOLIC PANEL
Anion gap: 9 (ref 5–15)
BUN: 13 mg/dL (ref 8–23)
CO2: 25 mmol/L (ref 22–32)
Calcium: 8 mg/dL — ABNORMAL LOW (ref 8.9–10.3)
Chloride: 102 mmol/L (ref 98–111)
Creatinine, Ser: 0.62 mg/dL (ref 0.44–1.00)
GFR, Estimated: >60 mL/min (ref >60)
Glucose, Bld: 89 mg/dL (ref 70–99)
Potassium: 3.6 mmol/L (ref 3.5–5.1)
Sodium: 136 mmol/L (ref 135–145)

## 2020-06-20 LAB — GLUCOSE, CAPILLARY: Glucose-Capillary: 85 mg/dL (ref 70–99)

## 2020-06-20 NOTE — Plan of Care (Signed)
  Problem: Nutrition: Goal: Adequate nutrition will be maintained Outcome: Progressing   

## 2020-06-20 NOTE — Progress Notes (Signed)
[place] Hospital Day(s): 5.   Post op day(s): 5 Days Post-Op.   Interval History:  Patient seen and examined No acute events or new complaints overnight.  She is somewhat discouraged, knowing things like the IV pump, and not getting more than clear liquid diet. Patient reports she some improvement since her drain placement No fever, chills, nausea, emesis, cough, SOB, or urinary symptoms Leukocytosis down to 15.5 from 18 yesterday. Potassium at 3.6 She continues to tolerate clear liquid diet. Normal bowel function The new drains have added to her discomfort.   Vital signs in last 24 hours: [min-max] current  Temp:  [98.2 F (36.8 C)-99.2 F (37.3 C)] 99.2 F (37.3 C) (03/19 0859) Pulse Rate:  [71-85] 72 (03/19 0859) Resp:  [16-26] 16 (03/19 0859) BP: (108-192)/(49-162) 177/72 (03/19 0859) SpO2:  [96 %-100 %] 98 % (03/19 0859)     Height: 5\' 6"  (299.2 cm) Weight: 54.4 kg BMI (Calculated): 19.37   Intake/Output last 2 shifts:  03/18 0701 - 03/19 0700 In: 911 [P.O.:180; I.V.:631; IV Piggyback:100] Out: 200 [Drains:200]   Physical Exam:  Constitutional: alert, cooperative and no distress Respiratory: breathing non-labored at rest  Cardiovascular: regular rate and sinus rhythm  Gastrointestinal:Soft, incisional soreness, non-distended, right-sided drain is green-tinged, but clear.  The pelvic drain is red-tinged, and also clear.   Integumentary:Laparoscopic and laparotomy incisions are CDI with staples, no erythema or drainage   Labs:  CBC Latest Ref Rng & Units 06/20/2020 06/19/2020 06/18/2020  WBC 4.0 - 10.5 K/uL 15.5(H) 18.1(H) 16.7(H)  Hemoglobin 12.0 - 15.0 g/dL 9.3(L) 9.8(L) 10.2(L)  Hematocrit 36.0 - 46.0 % 28.8(L) 31.2(L) 32.3(L)  Platelets 150 - 400 K/uL 270 262 243   CMP Latest Ref Rng & Units 06/20/2020 06/19/2020 06/18/2020  Glucose 70 - 99 mg/dL 89 113(H) 93  BUN 8 - 23 mg/dL 13 15 12   Creatinine 0.44 - 1.00 mg/dL  0.62 0.62 0.75  Sodium 135 - 145 mmol/L 136 137 137  Potassium 3.5 - 5.1 mmol/L 3.6 3.3(L) 3.6  Chloride 98 - 111 mmol/L 102 102 103  CO2 22 - 32 mmol/L 25 28 27   Calcium 8.9 - 10.3 mg/dL 8.0(L) 8.3(L) 8.2(L)  Total Protein 6.5 - 8.1 g/dL - 5.7(L) -  Total Bilirubin 0.3 - 1.2 mg/dL - 1.7(H) -  Alkaline Phos 38 - 126 U/L - 62 -  AST 15 - 41 U/L - 18 -  ALT 0 - 44 U/L - 10 -     Imaging studies: Appreciate percutaneous drain placement yesterday by Dr. Anselm Pancoast.   Assessment/Plan:  85 y.o. female with mild improvement in leukocytosis otherwise showing stability following drain placement 5 Days Post-Op s/p attempted robotic assisted laparoscopic right colectomy converted to open procedurefor cecal malignancy.   -We will continue to monitor drain output  -Continue broad-spectrum coverage with Zosyn and follow-up cultures and sensitivities of aspirate.  -Resume regular diet              - Monitor abdominal examination; on-going bowel function - Pain control prn; antiemetics prn              - Monitor renal function/electrolytes; normal  - Mobilization encouraged - Continue home medications    All of the above findings and recommendations were discussed with the patient, her daughter via telephone, and the medical team, and all of patient's questions were answered to her expressed satisfaction.  -- Ronny Bacon, MD [facility] 06/20/2020, 9:19 AM

## 2020-06-20 NOTE — TOC Progression Note (Signed)
Transition of Care [facility]) - Progression Note    Patient Details  Name: LAURIELLE SELMON MRN: [id number] Date of Birth: [birth year]  Transition of Care [facility]) CM/SW Contact  Zigmund Daniel Dorian Pod, RN Phone Number:[phone] 06/20/2020, 9:40 AM  Clinical Narrative:    RN spoke with pt today and inquired on [facility] services as recommended by therapy. Pt remains undecided and again request more time to think about it. Pt states she has a supportive daughter in the home and continue to have test done while inpt status. Pt does not wish again to make this decision today. RN has educated pt on possible delay of services with the [facility] agency if the referral is made at a later date. Pt verbalalized an understanding with possible delay in [place] based upon when the referral is received by the agency of choice. No other needs presented.  TOC will continue to inquire on possible needs.   Expected Discharge Plan: [place] Barriers to Discharge: Continued Medical Work up  Expected Discharge Plan and Services Expected Discharge Plan: [place] arrangements for the past 2 months: Single Family Home                                       Social Determinants of Health (SDOH) Interventions    Readmission Risk Interventions Readmission Risk Prevention Plan 06/18/2020  Medication Screening Complete  Transportation Screening Complete  Some recent data might be hidden

## 2020-06-21 NOTE — Progress Notes (Signed)
[place] Hospital Day(s): 6.   Post op day(s): 6 Days Post-Op.   Interval History:  Patient seen and examined, appears much brighter today, sitting more upright and visiting with a friend from Dr. Adah Salvage office. No acute events or new complaints overnight.  No fever, chills, nausea, emesis, cough, SOB, or urinary symptoms She continues to tolerate diet. She reports she is not quite ready to go home, of which I concur.  Vital signs in last 24 hours: [min-max] current  Temp:  [97.6 F (36.4 C)-98.6 F (37 C)] 97.6 F (36.4 C) (03/20 1150) Pulse Rate:  [61-72] 61 (03/20 1150) Resp:  [16-20] 18 (03/20 1150) BP: (162-192)/(75-87) 162/76 (03/20 1150) SpO2:  [97 %-100 %] 100 % (03/20 1150)     Height: 5\' 6"  (167.6 cm) Weight: 54.4 kg BMI (Calculated): 19.37   Intake/Output last 2 shifts:  03/19 0701 - 03/20 0700 In: 2197.3 [P.O.:660; I.V.:1407.7; IV Piggyback:129.6] Out: 30 [Drains:30]   Physical Exam:  Constitutional: alert, cooperative and no distress Respiratory: breathing non-labored at rest  Cardiovascular: regular rate and sinus rhythm  Gastrointestinal:Soft, incisional soreness, non-distended, right-sided drain is green-tinged, but clear.  Prior right upper quadrant tenderness seems to resolve despite the presence of the drain.  The pelvic drain is red-tinged, and also clear.  Output appears to be diminishing. Integumentary:Laparoscopic and laparotomy incisions are CDI with staples, no erythema or drainage   Labs:  CBC Latest Ref Rng & Units 06/20/2020 06/19/2020 06/18/2020  WBC 4.0 - 10.5 K/uL 15.5(H) 18.1(H) 16.7(H)  Hemoglobin 12.0 - 15.0 g/dL 9.3(L) 9.8(L) 10.2(L)  Hematocrit 36.0 - 46.0 % 28.8(L) 31.2(L) 32.3(L)  Platelets 150 - 400 K/uL 270 262 243   CMP Latest Ref Rng & Units 06/20/2020 06/19/2020 06/18/2020  Glucose 70 - 99 mg/dL 89 113(H) 93  BUN 8 - 23 mg/dL 13 15 12   Creatinine 0.44 - 1.00 mg/dL 0.62 0.62 0.75  Sodium  135 - 145 mmol/L 136 137 137  Potassium 3.5 - 5.1 mmol/L 3.6 3.3(L) 3.6  Chloride 98 - 111 mmol/L 102 102 103  CO2 22 - 32 mmol/L 25 28 27   Calcium 8.9 - 10.3 mg/dL 8.0(L) 8.3(L) 8.2(L)  Total Protein 6.5 - 8.1 g/dL - 5.7(L) -  Total Bilirubin 0.3 - 1.2 mg/dL - 1.7(H) -  Alkaline Phos 38 - 126 U/L - 62 -  AST 15 - 41 U/L - 18 -  ALT 0 - 44 U/L - 10 -     Imaging studies: Appreciate percutaneous drain placement March 18 by Dr. Anselm Pancoast.   Assessment/Plan:  85 y.o. female with mild downward trend in leukocytosis, deferred obtaining labs today.  Otherwise showing stability following drain placement 6 Days Post-Op s/p attempted robotic assisted laparoscopic right colectomy converted to open procedurefor cecal malignancy.   -We will continue to monitor drain output  -Continue broad-spectrum coverage with Zosyn and follow-up cultures and sensitivities of aspirate.  Repeat CBC in a.m.  -Continue regular diet              - Monitor abdominal examination; on-going bowel function - Pain control prn; antiemetics prn              - Monitor renal function/electrolytes;  - Mobilization encouraged - Continue home medications    All of the above findings and recommendations were discussed with the patient, her daughter via telephone, and the medical team, and all of patient's questions were answered to her expressed satisfaction.  -- Ronny Bacon,  MD [facility] 06/21/2020, 3:23 PM

## 2020-06-22 LAB — COMPREHENSIVE METABOLIC PANEL
ALT: 10 U/L (ref 0–44)
AST: 15 U/L (ref 15–41)
Albumin: 2.5 g/dL — ABNORMAL LOW (ref 3.5–5.0)
Alkaline Phosphatase: 62 U/L (ref 38–126)
Anion gap: 10 (ref 5–15)
BUN: 9 mg/dL (ref 8–23)
CO2: 24 mmol/L (ref 22–32)
Calcium: 8.3 mg/dL — ABNORMAL LOW (ref 8.9–10.3)
Chloride: 96 mmol/L — ABNORMAL LOW (ref 98–111)
Creatinine, Ser: 0.63 mg/dL (ref 0.44–1.00)
GFR, Estimated: >60 mL/min (ref >60)
Glucose, Bld: 78 mg/dL (ref 70–99)
Potassium: 3.9 mmol/L (ref 3.5–5.1)
Sodium: 130 mmol/L — ABNORMAL LOW (ref 135–145)
Total Bilirubin: 1.6 mg/dL — ABNORMAL HIGH (ref 0.3–1.2)
Total Protein: 5.6 g/dL — ABNORMAL LOW (ref 6.5–8.1)

## 2020-06-22 LAB — GLUCOSE, CAPILLARY
Glucose-Capillary: 81 mg/dL (ref 70–99)
Glucose-Capillary: 158 mg/dL — ABNORMAL HIGH (ref 70–99)

## 2020-06-22 LAB — CBC
HCT: 29.1 % — ABNORMAL LOW (ref 36.0–46.0)
Hemoglobin: 9.3 g/dL — ABNORMAL LOW (ref 12.0–15.0)
MCH: 20.6 pg — ABNORMAL LOW (ref 26.0–34.0)
MCHC: 32 g/dL (ref 30.0–36.0)
MCV: 64.5 fL — ABNORMAL LOW (ref 80.0–100.0)
Platelets: 345 10*3/uL (ref 150–400)
RBC: 4.51 MIL/uL (ref 3.87–5.11)
RDW: 15 % (ref 11.5–15.5)
WBC: 13.3 10*3/uL — ABNORMAL HIGH (ref 4.0–10.5)
nRBC: 0 % (ref 0.0–0.2)

## 2020-06-22 LAB — PREALBUMIN: Prealbumin: 6.1 mg/dL — ABNORMAL LOW (ref 18–38)

## 2020-06-22 LAB — SURGICAL PATHOLOGY

## 2020-06-22 MED ORDER — MAGIC MOUTHWASH
15.0000 mL | Freq: Four times a day (QID) | ORAL | Status: DC | PRN
  Administered 2020-06-22: 15 mL via ORAL
  Filled 2020-06-22 (×3): qty 20

## 2020-06-22 MED ORDER — ENOXAPARIN SODIUM 40 MG/0.4ML ~~LOC~~ SOLN
40.0000 mg | SUBCUTANEOUS | Status: DC
  Administered 2020-06-22 – 2020-06-25 (×4): 40 mg via SUBCUTANEOUS
  Filled 2020-06-22 (×5): qty 0.4

## 2020-06-22 NOTE — TOC Progression Note (Signed)
Transition of Care [facility]) - Progression Note    Patient Details  Name: Phyllis Ross MRN: [id number] Date of Birth: 13-Oct-1935  Transition of Care [facility]) CM/SW Contact  Beverly Sessions, RN Phone Number: 06/22/2020, 3:50 PM  Clinical Narrative:    Patient agreeable to home health services.  States she does not have a preference of home health agency.  Referral made and accepted by [place] with [facility]    Expected Discharge Plan: [place] Barriers to Discharge: Continued Medical Work up  Expected Discharge Plan and Services Expected Discharge Plan: [place] arrangements for the past 2 months: Single Family Home                                       Social Determinants of Health (SDOH) Interventions    Readmission Risk Interventions Readmission Risk Prevention Plan 06/18/2020  Medication Screening Complete  Transportation Screening Complete  Some recent data might be hidden

## 2020-06-22 NOTE — Progress Notes (Signed)
[place] Hospital Day(s): 7.   Post op day(s): 7 Days Post-Op.   Interval History:  Patient seen and examined No acute events or new complaints overnight.  Patient reports she continues to feel better No abdominal pain, fever, chills, nausea, emesis Her leukocytosis continues to 13.3K; no fevers She is maintaining her renal function; sCr - 0.63; UO unmeasured x5 Mild hyponatremia to 130, no other significant electrolyte derangements Percutaneous drains in place:   - Right Anterior: 35 ccs; clearing, more serous today, some sediment in tubing   - Right Buttock: 40 ccs: Serosanguinous  Cx from drain placement growing multiple present; continues on Zosyn She is on regular diet; tolerating well She continues to have normal bowel   Vital signs in last 24 hours: [min-max] current  Temp:  [97.6 F (36.4 C)-98.1 F (36.7 C)] 97.7 F (36.5 C) (03/21 0428) Pulse Rate:  [61-74] 74 (03/21 0428) Resp:  [16-18] 16 (03/21 0428) BP: (160-176)/(76-88) 160/85 (03/21 0428) SpO2:  [96 %-100 %] 96 % (03/21 0428)     Height: 5\' 6"  (167.6 cm) Weight: 54.4 kg BMI (Calculated): 19.37   Intake/Output last 2 shifts:  03/20 0701 - 03/21 0700 In: 20  Out: 75 [Drains:75]   Physical Exam:  Constitutional: alert, cooperative and no distress Respiratory: breathing non-labored at rest  Cardiovascular: regular rate and sinus rhythm  Gastrointestinal:Soft, incisional soreness, non-distended, no rebound/guarding. Right anterior abdominal drain, output clearing and becoming more serous with some sediment in tubing. Right gluteal drain, output serosanguinous  Integumentary:Laparoscopic and laparotomy incisionsare CDI with staples, early ecchymosis, no erythema or drainage  Labs:  CBC Latest Ref Rng & Units 06/22/2020 06/20/2020 06/19/2020  WBC 4.0 - 10.5 K/uL 13.3(H) 15.5(H) 18.1(H)  Hemoglobin 12.0 - 15.0 g/dL 9.3(L) 9.3(L) 9.8(L)  Hematocrit 36.0 - 46.0 %  29.1(L) 28.8(L) 31.2(L)  Platelets 150 - 400 K/uL 345 270 262   CMP Latest Ref Rng & Units 06/22/2020 06/20/2020 06/19/2020  Glucose 70 - 99 mg/dL 78 89 113(H)  BUN 8 - 23 mg/dL 9 13 15   Creatinine 0.44 - 1.00 mg/dL 0.63 0.62 0.62  Sodium 135 - 145 mmol/L 130(L) 136 137  Potassium 3.5 - 5.1 mmol/L 3.9 3.6 3.3(L)  Chloride 98 - 111 mmol/L 96(L) 102 102  CO2 22 - 32 mmol/L 24 25 28   Calcium 8.9 - 10.3 mg/dL 8.3(L) 8.0(L) 8.3(L)  Total Protein 6.5 - 8.1 g/dL 5.6(L) - 5.7(L)  Total Bilirubin 0.3 - 1.2 mg/dL 1.6(H) - 1.7(H)  Alkaline Phos 38 - 126 U/L 62 - 62  AST 15 - 41 U/L 15 - 18  ALT 0 - 44 U/L 10 - 10     Imaging studies: No new pertinent imaging studies   Assessment/Plan:  85 y.o. female 7 Days Post-Op s/p attempted robotic assisted laparoscopic right colectomy converted to open procedurefor cecal malignancy.   - Continue percutaneous drains. She continues to do well clinically and leukocytosis in improving. We will continue to attempt to manage this conservatively and avoid re-intervention.   - Continue Regular Diet  - Monitor abdominal examination; on-going bowel function - Pain control prn; antiemetics prn  - Monitor leukocytosis; improving - Monitor renal function/electrolytes; normal - Mobilization encouraged -Continuehome medications   All of the above findings and recommendations were discussed with the patient, and the medical team, and all of patient's questions were answered to her expressed satisfaction.  -- Edison Simon, PA-C [facility] 06/22/2020, 7:30 AM [phone] M-F: 7am - 4pm

## 2020-06-22 NOTE — Care Management Important Message (Signed)
Important Message  Patient Details  Name: Phyllis Ross MRN: [id number] Date of Birth: [birth year]   Medicare Important Message Given:  Yes     Dannette Barbara 06/22/2020, 12:02 PM

## 2020-06-22 NOTE — Progress Notes (Signed)
PT Cancellation Note  Patient Details Name: Phyllis Ross MRN: [id number] DOB: March 19, 1936   Cancelled Treatment:    Reason Eval/Treat Not Completed: Fatigue/lethargy limiting ability to participate;Other (comment)   Pt in bed refusing therapy "I'm so weak".  Recliner at bedside noted with LE's elevated.  When questioned, pt stated she got up by herself and back to bed.  Educated on need to call for assist with mobility.  She is encouraged to walk but she continued to decline stating she is too weak.  Will schedule pt in AM to access mobility as she has not walked in several days and reports feeling weaker.  Initial recommendations were HHPT and will update as appropriate.    Chesley Noon 06/22/2020, 4:36 PM

## 2020-06-23 LAB — CBC
HCT: 29.2 % — ABNORMAL LOW (ref 36.0–46.0)
Hemoglobin: 9.4 g/dL — ABNORMAL LOW (ref 12.0–15.0)
MCH: 20.3 pg — ABNORMAL LOW (ref 26.0–34.0)
MCHC: 32.2 g/dL (ref 30.0–36.0)
MCV: 63.2 fL — ABNORMAL LOW (ref 80.0–100.0)
Platelets: 373 10*3/uL (ref 150–400)
RBC: 4.62 MIL/uL (ref 3.87–5.11)
RDW: 15 % (ref 11.5–15.5)
WBC: 13.9 10*3/uL — ABNORMAL HIGH (ref 4.0–10.5)
nRBC: 0 % (ref 0.0–0.2)

## 2020-06-23 LAB — GLUCOSE, CAPILLARY: Glucose-Capillary: 140 mg/dL — ABNORMAL HIGH (ref 70–99)

## 2020-06-23 MED ORDER — ENSURE ENLIVE PO LIQD
237.0000 mL | Freq: Three times a day (TID) | ORAL | Status: DC
  Administered 2020-06-23 – 2020-06-25 (×5): 237 mL via ORAL

## 2020-06-23 NOTE — Progress Notes (Signed)
Physical Therapy Treatment Patient Details Name: Phyllis Ross MRN: [id number] DOB: 21-Mar-1936 Today's Date: 06/23/2020    History of Present Illness admitted for acute hospitalization s/p open R colectomy due to cecal malignancy (06/15/20)    PT Comments    Pt resting in bed upon PT arrival; agreeable to PT session.  Breakfast arrived shortly after so pt set up in recliner to have breakfast per pt request and therapist returned later to finish session.  SBA semi-supine to sitting edge of bed; CGA to SBA with transfers; and CGA to SBA ambulating 200 feet with RW.  Pt steady throughout sessions activities (no loss of balance noted during session).  Pt declined stairs trial (pt reports her husband able to assist with 1 step to enter home with railing for support).  Pt reports no concerns for discharge home.  Will continue to focus on strengthening, progressive mobility, balance, and progression to least restrictive assistive device during hospitalization.   Follow Up Recommendations  Home health PT     Equipment Recommendations   (pt has RW at home)    Recommendations for Other Services       Precautions / Restrictions Precautions Precautions: Fall Precaution Comments: abdominal binder Restrictions Weight Bearing Restrictions: No    Mobility  Bed Mobility Overal bed mobility: Needs Assistance Bed Mobility: Supine to Sit     Supine to sit: Supervision;HOB elevated     General bed mobility comments: mild increased time to perform on own but no physical assist or cueing required    Transfers Overall transfer level: Needs assistance   Transfers: Sit to/from Stand;Stand Pivot Transfers Sit to Stand: Min guard;Supervision (x1 trial standing from bed and x1 trial standing from recliner) Stand pivot transfers: Min guard;Supervision (stand step turn bed to recliner)       General transfer comment: pt appearing steady and safe with  transfers  Ambulation/Gait Ambulation/Gait assistance: Min guard;Supervision Gait Distance (Feet): 200 Feet Assistive device: Rolling walker (2 wheeled) Gait Pattern/deviations: Step-through pattern Gait velocity: mildly decreased   General Gait Details: steady ambulating with RW   Stairs Stairs:  (pt declined stairs trial (pt reports her husband able to assist with 1 step to enter home with railing))           Wheelchair Mobility    Modified Rankin (Stroke Patients Only)       Balance Overall balance assessment: Needs assistance Sitting-balance support: No upper extremity supported;Feet supported Sitting balance-Leahy Scale: Good Sitting balance - Comments: steady sitting reaching outside BOS   Standing balance support: No upper extremity supported Standing balance-Leahy Scale: Good Standing balance comment: steady standing reaching within BOS                            Cognition Arousal/Alertness: Awake/alert Behavior During Therapy: WFL for tasks assessed/performed Overall Cognitive Status: Within Functional Limits for tasks assessed                                        Exercises      General Comments General comments (skin integrity, edema, etc.): 2 drains in place; abdominal binder donned prior to getting OOB.  Nursing cleared pt for participation in physical therapy.  Pt agreeable to PT session.      Pertinent Vitals/Pain Pain Assessment: No/denies pain Pain Intervention(s): Limited activity within patient's tolerance;Monitored during session;Repositioned  Vitals (  HR and O2 on room air) stable and WFL throughout treatment session.    Home Living                      Prior Function            PT Goals (current goals can now be found in the care plan section) Acute Rehab PT Goals Patient Stated Goal: to go home PT Goal Formulation: With patient Time For Goal Achievement: 07/02/20 Potential to Achieve Goals:  Good Progress towards PT goals: Progressing toward goals    Frequency    Min 2X/week      PT Plan Current plan remains appropriate    Co-evaluation              AM-PAC PT "6 Clicks" Mobility   Outcome Measure  Help needed turning from your back to your side while in a flat bed without using bedrails?: None Help needed moving from lying on your back to sitting on the side of a flat bed without using bedrails?: None Help needed moving to and from a bed to a chair (including a wheelchair)?: A Little Help needed standing up from a chair using your arms (e.g., wheelchair or bedside chair)?: A Little Help needed to walk in hospital room?: A Little Help needed climbing 3-5 steps with a railing? : A Little 6 Click Score: 20    End of Session Equipment Utilized During Treatment: Gait belt (gait belt positioned up high away from drains) Activity Tolerance: Patient tolerated treatment well Patient left: in chair;with call bell/phone within reach;with chair alarm set Nurse Communication: Mobility status;Precautions PT Visit Diagnosis: Muscle weakness (generalized) (M62.81);Difficulty in walking, not elsewhere classified (R26.2)     Time: 0935 ([id number] (pt assisted to recliner and then pt requesting to eat breakfast that had just arrived so therapist came back to finish session later))-0950 PT Time Calculation (min) (ACUTE ONLY): 15 min  Charges:  $Gait Training: 8-22 mins $Therapeutic Activity: 8-22 mins                     Leitha Bleak, PT 06/23/20, 11:36 AM

## 2020-06-23 NOTE — Progress Notes (Signed)
[place] Hospital Day(s): 8.   Post op day(s): 8 Days Post-Op.   Interval History:  Patient seen and examined No acute events or new complaints overnight.  Patient reports she is feeling well, biggest complaint is decreased appetite No fever, chills, nausea, emesis Her leukocytosis has staled this morning, remains 13.9K Percutaneous drains in place:   - Right Anterior: 15 ccs; clearing, more serous today, some sediment in tubing   - Right Buttock: 20 ccs: Serosanguinous  Cx from drain placement growing multiple present; continues on Zosyn She is on regular diet; tolerating well She continues to have normal bowel   Vital signs in last 24 hours: [min-max] current  Temp:  [97.6 F (36.4 C)-98.7 F (37.1 C)] 97.6 F (36.4 C) (03/22 0442) Pulse Rate:  [71-75] 71 (03/22 0442) Resp:  [16-20] 16 (03/22 0442) BP: (133-160)/(66-78) 157/73 (03/22 0442) SpO2:  [96 %-100 %] 97 % (03/22 0442)     Height: 5\' 6"  (167.6 cm) Weight: 54.4 kg BMI (Calculated): 19.37   Intake/Output last 2 shifts:  03/21 0701 - 03/22 0700 In: 561 [I.V.:224.2; IV Piggyback:306.8] Out: 165 [Urine:100; Drains:35; Stool:30]   Physical Exam:  Constitutional: alert, cooperative and no distress Respiratory: breathing non-labored at rest  Cardiovascular: regular rate and sinus rhythm  Gastrointestinal:Soft, incisional soreness, non-distended, no rebound/guarding. Right anterior abdominal drain, output clearing and becoming more serous with some sediment in tubing. Right gluteal drain, output serosanguinous  Integumentary:Laparoscopic and laparotomy incisionsare CDI with staples, early ecchymosis, no erythema or drainage  Labs:  CBC Latest Ref Rng & Units 06/23/2020 06/22/2020 06/20/2020  WBC 4.0 - 10.5 K/uL 13.9(H) 13.3(H) 15.5(H)  Hemoglobin 12.0 - 15.0 g/dL 9.4(L) 9.3(L) 9.3(L)  Hematocrit 36.0 - 46.0 % 29.2(L) 29.1(L) 28.8(L)  Platelets 150 - 400 K/uL 373 345 270    CMP Latest Ref Rng & Units 06/22/2020 06/20/2020 06/19/2020  Glucose 70 - 99 mg/dL 78 89 113(H)  BUN 8 - 23 mg/dL 9 13 15   Creatinine 0.44 - 1.00 mg/dL 0.63 0.62 0.62  Sodium 135 - 145 mmol/L 130(L) 136 137  Potassium 3.5 - 5.1 mmol/L 3.9 3.6 3.3(L)  Chloride 98 - 111 mmol/L 96(L) 102 102  CO2 22 - 32 mmol/L 24 25 28   Calcium 8.9 - 10.3 mg/dL 8.3(L) 8.0(L) 8.3(L)  Total Protein 6.5 - 8.1 g/dL 5.6(L) - 5.7(L)  Total Bilirubin 0.3 - 1.2 mg/dL 1.6(H) - 1.7(H)  Alkaline Phos 38 - 126 U/L 62 - 62  AST 15 - 41 U/L 15 - 18  ALT 0 - 44 U/L 10 - 10     Imaging studies: No new pertinent imaging studies   Assessment/Plan:  85 y.o. female 8 Days Post-Op s/p attempted robotic assisted laparoscopic right colectomy converted to open procedurefor cecal malignancy.   - Continue percutaneous drains. She continues to do well clinically and leukocytosis in improving. We will continue to attempt to manage this conservatively and avoid re-intervention.   - Continue Regular Diet  - Monitor abdominal examination; on-going bowel function - Pain control prn; antiemetics prn  - Monitor leukocytosis; improving; check in AM - Monitor renal function/electrolytes; normal - Mobilization encouraged; PT on board; I did proactively place home health orders today -Continuehome medications    - Discharge Planning: Continue trending leukocytosis  All of the above findings and recommendations were discussed with the patient, and the medical team, and all of patient's questions were answered to her expressed satisfaction.  -- Edison Simon, PA-C [facility]  06/23/2020, 7:23 AM [phone] M-F: 7am - 4pm

## 2020-06-23 NOTE — Progress Notes (Signed)
Initial Nutrition Assessment  DOCUMENTATION CODES:   Severe malnutrition in context of social or environmental circumstances  INTERVENTION:   Ensure Enlive po TID, each supplement provides 350 kcal and 20 grams of protein  Recommend MVI po daily   Pt at high refeed risk; recommend monitor potassium, magnesium and phosphorus labs daily until stable  NUTRITION DIAGNOSIS:   Severe Malnutrition related to social / environmental circumstances as evidenced by severe fat depletion,moderate fat depletion,moderate muscle depletion,severe muscle depletion.  GOAL:   Patient will meet greater than or equal to 90% of their needs  MONITOR:   PO intake,Supplement acceptance,Labs,Weight trends,Skin,I & O's  REASON FOR ASSESSMENT:   Consult Assessment of nutrition requirement/status  ASSESSMENT:   85 y/o female with h/o CAD, HTN, polypharmacy and anxiety who is admitted with cecal mass now s/p open right colectomy, resection of 55.5cm ileum and LOA 3/14. 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   Met with pt in room today. Pt is up moving around and then sitting in chair at time of RD visit. Pt reports good appetite and oral intake at baseline but reports poor appetite and oral intake in hospital. Pt reports that she ate ~30% of her breakfast this morning and 1/2 of a banana for lunch. Pt reports that she has been drinking some Ensure that the nurses are bringing to her (prefers vanilla or chocolate). RD discussed with pt the importance of adequate nutrition needed for post op healing and to preserve lean muscle. Pt would like to continue to receive Ensure supplements; would also recommend daily MVI. Pt is at high refeed risk. Per chart, pt is down 4lbs(3%) since August; this is not significant. No new weight since admit; will request weekly weights. Pt does report having flatus and BMs. Drains in place with 90m output.    Medications reviewed and include: lovenox, KCl, zosyn   Labs  reviewed: Na 130(L) Wbc- 13.9(H), Hgb 9.4(L), Hct 29.2(L)  NUTRITION - FOCUSED PHYSICAL EXAM:  Flowsheet Row Most Recent Value  Orbital Region Moderate depletion  Upper Arm Region Severe depletion  Thoracic and Lumbar Region Severe depletion  Buccal Region Moderate depletion  Temple Region Moderate depletion  Clavicle Bone Region Severe depletion  Clavicle and Acromion Bone Region Severe depletion  Scapular Bone Region Moderate depletion  Dorsal Hand Severe depletion  Patellar Region Severe depletion  Anterior Thigh Region Severe depletion  Posterior Calf Region Severe depletion  Edema (RD Assessment) None  Hair Reviewed  Eyes Reviewed  Mouth Reviewed  Skin Reviewed  Nails Reviewed     Diet Order:   Diet Order            Diet regular Room service appropriate? Yes; Fluid consistency: Thin  Diet effective now                EDUCATION NEEDS:   Education needs have been addressed  Skin:  Skin Assessment: Reviewed RN Assessment (incision abdomen)  Last BM:  3/22- type 7  Height:   Ht Readings from Last 1 Encounters:  06/16/20 _0  (1.676 m)    Weight:   Wt Readings from Last 1 Encounters:  06/16/20 54.4 kg    Ideal Body Weight:  59 kg  BMI:  Body mass index is 19.36 kg/m.  Estimated Nutritional Needs:   Kcal:  1500-1700kcal/day  Protein:  75-85g/day  Fluid:  1.4-1.6L/day  CKoleen DistanceMS, RD, LDN Please refer to [facility] Centerfor RD and/or RD on-call/weekend/after hours pager

## 2020-06-24 ENCOUNTER — Inpatient Hospital Stay: Payer: Medicare PPO

## 2020-06-24 ENCOUNTER — Telehealth: Payer: Self-pay | Admitting: Nurse Practitioner

## 2020-06-24 ENCOUNTER — Telehealth: Payer: Self-pay | Admitting: Surgery

## 2020-06-24 ENCOUNTER — Encounter: Payer: Self-pay | Admitting: Surgery

## 2020-06-24 DIAGNOSIS — E43 Unspecified severe protein-calorie malnutrition: Secondary | ICD-10-CM | POA: Insufficient documentation

## 2020-06-24 LAB — CBC
HCT: 27.5 % — ABNORMAL LOW (ref 36.0–46.0)
Hemoglobin: 8.9 g/dL — ABNORMAL LOW (ref 12.0–15.0)
MCH: 21 pg — ABNORMAL LOW (ref 26.0–34.0)
MCHC: 32.4 g/dL (ref 30.0–36.0)
MCV: 65 fL — ABNORMAL LOW (ref 80.0–100.0)
Platelets: 418 10*3/uL — ABNORMAL HIGH (ref 150–400)
RBC: 4.23 MIL/uL (ref 3.87–5.11)
RDW: 15.3 % (ref 11.5–15.5)
WBC: 14.9 10*3/uL — ABNORMAL HIGH (ref 4.0–10.5)
nRBC: 0 % (ref 0.0–0.2)

## 2020-06-24 MED ORDER — SODIUM CHLORIDE 0.9 % IV SOLN: INTRAVENOUS | Status: DC

## 2020-06-24 MED ORDER — IOHEXOL 9 MG/ML PO SOLN
500.0000 mL | ORAL | Status: AC
Start: 2020-06-24 — End: 2020-06-24
  Administered 2020-06-24 (×2): 500 mL via ORAL

## 2020-06-24 MED ORDER — IOHEXOL 300 MG/ML  SOLN
75.0000 mL | Freq: Once | INTRAMUSCULAR | Status: AC | PRN
  Administered 2020-06-24: 75 mL via INTRAVENOUS

## 2020-06-24 NOTE — Progress Notes (Signed)
[place] Hospital Day(s): 9.   Post op day(s): 9 Days Post-Op.   Interval History:  Patient seen and examined No acute events or new complaints overnight.  Patient reports she is doing okay, really anxious to be discharged. She reports she is starting to be depressed given her complicated post-operative course No fever, chills, nausea, emesis Her leukocytosis is slightly worse this morning, up to 14.9K; no fevers Percutaneous drains in place:              - Right Anterior: 9 ccs; clearing, still seropurulent              - Right Buttock: unmeasured; serosanguious Cx from drain placement growing multiple present; continues on Zosyn She is on regular diet; tolerating well She continues to have normal bowel  Worked with therapies; recommending HHPT  Vital signs in last 24 hours: [min-max] current  Temp:  [97.8 F (36.6 C)-98.7 F (37.1 C)] 98.3 F (36.8 C) (03/23 0716) Pulse Rate:  [68-73] 69 (03/23 0716) Resp:  [16-20] 20 (03/23 0509) BP: (128-153)/(57-74) 146/71 (03/23 0716) SpO2:  [97 %-99 %] 98 % (03/23 0716)     Height: 5\' 6"  (167.6 cm) Weight: 54.4 kg BMI (Calculated): 19.37   Intake/Output last 2 shifts:  03/22 0701 - 03/23 0700 In: 240 [P.O.:240] Out: 9 [Drains:9]   Physical Exam:  Constitutional: alert, cooperative and no distress Respiratory: breathing non-labored at rest  Cardiovascular: regular rate and sinus rhythm  Gastrointestinal:Soft, incisional soreness, non-distended, no rebound/guarding. Right anterior abdominal drain, output clearing but still seropurulent with some sediment in tubing. Right gluteal drain, output serosanguinous  Integumentary:Laparoscopic and laparotomy incisionsare CDI with staples, early ecchymosis, no erythema or drainage   Labs:  CBC Latest Ref Rng & Units 06/24/2020 06/23/2020 06/22/2020  WBC 4.0 - 10.5 K/uL 14.9(H) 13.9(H) 13.3(H)  Hemoglobin 12.0 - 15.0 g/dL 8.9(L) 9.4(L) 9.3(L)   Hematocrit 36.0 - 46.0 % 27.5(L) 29.2(L) 29.1(L)  Platelets 150 - 400 K/uL 418(H) 373 345   CMP Latest Ref Rng & Units 06/22/2020 06/20/2020 06/19/2020  Glucose 70 - 99 mg/dL 78 89 113(H)  BUN 8 - 23 mg/dL 9 13 15   Creatinine 0.44 - 1.00 mg/dL 0.63 0.62 0.62  Sodium 135 - 145 mmol/L 130(L) 136 137  Potassium 3.5 - 5.1 mmol/L 3.9 3.6 3.3(L)  Chloride 98 - 111 mmol/L 96(L) 102 102  CO2 22 - 32 mmol/L 24 25 28   Calcium 8.9 - 10.3 mg/dL 8.3(L) 8.0(L) 8.3(L)  Total Protein 6.5 - 8.1 g/dL 5.6(L) - 5.7(L)  Total Bilirubin 0.3 - 1.2 mg/dL 1.6(H) - 1.7(H)  Alkaline Phos 38 - 126 U/L 62 - 62  AST 15 - 41 U/L 15 - 18  ALT 0 - 44 U/L 10 - 10    Imaging studies: No new pertinent imaging studies   Assessment/Plan:  85 y.o. female again with persistent elevation in her WBC this morning 9 Days Post-Op s/p attempted robotic assisted laparoscopic right colectomy converted to open procedurefor cecal malignancy complicated by anastomotic breakdown/leak vs abscess controlled currently by drain.   - Unfortunately, she continues to have worsening leukocytosis this morning. Given her previously seen intra-abdominal fluid collection, I do think she will need repeat CT Abdomen/pelvis today to ensure there is no worsening intra-abdominal process. She is doing well clinically; however, my biggest worry is that she has a sub-acute leak/abscess    - I will make her NPO this morning pending CT completion   - continue  IV Abx (Zosyn)  - Monitor abdominal examination; on-going bowel function - Pain control prn; antiemetics prn             - Monitor leukocytosis; worsening - Monitor renal function/electrolytes; normal - Mobilization encouraged; PT on board; recommending HHPT -Continuehome medications  All of the above findings and recommendations were discussed with the patient, and the medical team, and all of patient's questions were answered to her expressed  satisfaction.  -- Edison Simon, PA-C [facility] 06/24/2020, 7:32 AM [phone] M-F: 7am - 4pm

## 2020-06-24 NOTE — Telephone Encounter (Signed)
Pt's daughter, Micheline Maze, called advising the pt is still @ [facility] & she would like to spk w/Dr. Christian Mate directly when he makes his rounds today.  Otherwise, please call her @  [phone] to further discuss her mom.  Thank you

## 2020-06-24 NOTE — Telephone Encounter (Signed)
Patient's husband reached out with questions regarding patient's current hospitalization and post surgery. Patient currently admitted. Answered some questions for him, provided emotional support. Also spoke to patient's daughter, Colletta Maryland. Again, provided emotional support.

## 2020-06-25 ENCOUNTER — Other Ambulatory Visit: Payer: Medicare PPO

## 2020-06-25 LAB — BASIC METABOLIC PANEL
Anion gap: 7 (ref 5–15)
BUN: 9 mg/dL (ref 8–23)
CO2: 25 mmol/L (ref 22–32)
Calcium: 8.4 mg/dL — ABNORMAL LOW (ref 8.9–10.3)
Chloride: 102 mmol/L (ref 98–111)
Creatinine, Ser: 0.68 mg/dL (ref 0.44–1.00)
GFR, Estimated: >60 mL/min (ref >60)
Glucose, Bld: 101 mg/dL — ABNORMAL HIGH (ref 70–99)
Potassium: 4.5 mmol/L (ref 3.5–5.1)
Sodium: 134 mmol/L — ABNORMAL LOW (ref 135–145)

## 2020-06-25 LAB — CBC
HCT: 27.7 % — ABNORMAL LOW (ref 36.0–46.0)
Hemoglobin: 9 g/dL — ABNORMAL LOW (ref 12.0–15.0)
MCH: 20.7 pg — ABNORMAL LOW (ref 26.0–34.0)
MCHC: 32.5 g/dL (ref 30.0–36.0)
MCV: 63.8 fL — ABNORMAL LOW (ref 80.0–100.0)
Platelets: 451 10*3/uL — ABNORMAL HIGH (ref 150–400)
RBC: 4.34 MIL/uL (ref 3.87–5.11)
RDW: 15 % (ref 11.5–15.5)
WBC: 15.4 10*3/uL — ABNORMAL HIGH (ref 4.0–10.5)
nRBC: 0 % (ref 0.0–0.2)

## 2020-06-25 LAB — MAGNESIUM: Magnesium: 1.6 mg/dL — ABNORMAL LOW (ref 1.7–2.4)

## 2020-06-25 LAB — AEROBIC/ANAEROBIC CULTURE W GRAM STAIN (SURGICAL/DEEP WOUND)

## 2020-06-25 LAB — PHOSPHORUS: Phosphorus: 3.1 mg/dL (ref 2.5–4.6)

## 2020-06-25 MED ORDER — MAGNESIUM SULFATE 2 GM/50ML IV SOLN
2.0000 g | Freq: Once | INTRAVENOUS | Status: AC
  Administered 2020-06-25: 2 g via INTRAVENOUS
  Filled 2020-06-25: qty 50

## 2020-06-25 NOTE — Care Management Important Message (Signed)
Important Message  Patient Details  Name: Phyllis Ross MRN: [id number] Date of Birth: [birth year]   Medicare Important Message Given:  Yes     Dannette Barbara 06/25/2020, 2:00 PM

## 2020-06-25 NOTE — Progress Notes (Signed)
[place] Hospital Day(s): 10.   Post op day(s): 10 Days Post-Op.   Interval History:  Patient seen and examined No acute events or new complaints overnight.  Patient reports she continues to do well aside from her spirits.She is worried she is starting to get depressed No abdominal pain, nausea, emesis, fever, chills She continues to have very gradual increases in her leukocytosis; now 15.4K (trend: 13.3K --> 13.9K --> 14.9K --> 15.4K) She is maintaining her renal function, sCr - 0.68, good UO  She has mild hypomagnesemia to 1.6 but otherwise no significant electrolyte derangements Percutaneous drains in place:  - Right Anterior: 10 ccs; clearing, still seropurulent  - Right Buttock: unmeasured; serosanguious Cx from drain placement growing multiple present; continues on Zosyn; Day 6 She is on regular diet; tolerating well She continues to have normal bowel  Worked with therapies; recommending HHPT  She is very anxious to go home, as is family. Plan to meet this afternoon after tumor board conference and discuss plan.   Vital signs in last 24 hours: [min-max] current  Temp:  [97.8 F (36.6 C)-98.5 F (36.9 C)] 97.8 F (36.6 C) (03/24 0439) Pulse Rate:  [59-72] 72 (03/24 0439) Resp:  [16-20] 18 (03/24 0439) BP: (116-159)/(57-80) 148/61 (03/24 0439) SpO2:  [98 %-100 %] 98 % (03/24 0439)     Height: 5\' 6"  (167.6 cm) Weight: 54.4 kg BMI (Calculated): 19.37   Intake/Output last 2 shifts:  03/23 0701 - 03/24 0700 In: 1566.2 [P.O.:240; I.V.:1105.8; IV Piggyback:215.4] Out: 10 [Drains:10]   Physical Exam:  Constitutional: alert, cooperative and no distress Respiratory: breathing non-labored at rest  Cardiovascular: regular rate and sinus rhythm  Gastrointestinal:Soft, incisional soreness, non-distended, no rebound/guarding. Right anterior abdominal drain, output clearing but still seropurulent with some sediment  in tubing. Right gluteal drain, output serosanguinous  Integumentary:Laparoscopic and laparotomy incisionsare CDI with staples, early ecchymosis, no erythema or drainage  Labs:  CBC Latest Ref Rng & Units 06/25/2020 06/24/2020 06/23/2020  WBC 4.0 - 10.5 K/uL 15.4(H) 14.9(H) 13.9(H)  Hemoglobin 12.0 - 15.0 g/dL 9.0(L) 8.9(L) 9.4(L)  Hematocrit 36.0 - 46.0 % 27.7(L) 27.5(L) 29.2(L)  Platelets 150 - 400 K/uL 451(H) 418(H) 373   CMP Latest Ref Rng & Units 06/25/2020 06/22/2020 06/20/2020  Glucose 70 - 99 mg/dL 101(H) 78 89  BUN 8 - 23 mg/dL 9 9 13   Creatinine 0.44 - 1.00 mg/dL 0.68 0.63 0.62  Sodium 135 - 145 mmol/L 134(L) 130(L) 136  Potassium 3.5 - 5.1 mmol/L 4.5 3.9 3.6  Chloride 98 - 111 mmol/L 102 96(L) 102  CO2 22 - 32 mmol/L 25 24 25   Calcium 8.9 - 10.3 mg/dL 8.4(L) 8.3(L) 8.0(L)  Total Protein 6.5 - 8.1 g/dL - 5.6(L) -  Total Bilirubin 0.3 - 1.2 mg/dL - 1.6(H) -  Alkaline Phos 38 - 126 U/L - 62 -  AST 15 - 41 U/L - 15 -  ALT 0 - 44 U/L - 10 -     Imaging studies: No new pertinent imaging studies   Assessment/Plan:  85 y.o. female again with subtle increase in leukocytosis otherwise doing well clinically 10 Days Post-Op s/p attempted robotic assisted laparoscopic right colectomy converted to open procedurefor cecal malignancy complicated by anastomotic breakdown/leak vs abscess controlled currently by drain.   - okay to continue regular diet   - Continue IV Abx (Zosyn); Day 6              - Monitor abdominal examination; on-going bowel  function - Pain control prn; antiemetics prn - Monitor leukocytosis; worsening - Monitor renal function/electrolytes; normal - Mobilization encouraged; PT on board; recommending HHPT -Continuehome medications   - Discharge Planning: Plan to meet with family this afternoon to discuss plan. Myself and Dr Christian Mate had a long discussion with the patient and her son at bedside this  morning. At this point, it is a very difficult clinical picture. She has a slowly increasing leukocytosis which may be attributable to a very small anastomotic leak which is contained by the drain. She is otherwise doing well and having normal, frequent, bowel function. We had an extensive discussion regarding the next steps including continues observation and possible discharge vs what re-intervention surgically may look like and entail. I do think that she is leaning towards discharge with close follow up, which I do not think is unreasonable. We discussed that surgery may be overly taxing on her body in her current state and the overall question is whether or not this would be more detrimental long term. She, and her son seemed understanding of this, and that she may ultimately require additional surgery should she deteriorate. For now, we will stay the course and discuss this further this afternoon with her family at bedside and determine next steps in care.   All of the above findings and recommendations were discussed with the patient, patient's family (son at bedside), and the medical team, and all of patient's and family's questions were answered to their expressed satisfaction.  -- Phyllis Simon, PA-C [facility] 06/25/2020, 7:17 AM [phone] M-F: 7am - 4pm

## 2020-06-25 NOTE — Progress Notes (Signed)
Tumor Board Documentation  Phyllis Ross was presented by Dr Mike Gip at our Tumor Board on 06/25/2020, which included representatives from medical oncology,radiation oncology,internal medicine,navigation,pathology,radiology,surgical,pharmacy,genetics,research,palliative care,pulmonology.  Phyllis Ross currently presents as a current patient,for MDC,for new positive pathology with history of the following treatments: active survellience,surgical intervention(s).  Additionally, we reviewed previous medical and familial history, history of present illness, and recent lab results along with all available histopathologic and imaging studies. The tumor board considered available treatment options and made the following recommendations: Chemotherapy    The following procedures/referrals were also placed: No orders of the defined types were placed in this encounter.   Clinical Trial Status: not discussed   Staging used: Pathologic Stage  AJCC Staging: T: 3 N: 2b   Group: Adenocarcinoma of Colon   National site-specific guidelines NCCN were discussed with respect to the case.  Tumor board is a meeting of clinicians from various specialty areas who evaluate and discuss patients for whom a multidisciplinary approach is being considered. Final determinations in the plan of care are those of the provider(s). The responsibility for follow up of recommendations given during tumor board is that of the provider.   Today's extended care, comprehensive team conference, Phyllis Ross was not present for the discussion and was not examined.   Multidisciplinary Tumor Board is a multidisciplinary case peer review process.  Decisions discussed in the Multidisciplinary Tumor Board reflect the opinions of the specialists present at the conference without having examined the patient.  Ultimately, treatment and diagnostic decisions rest with the primary provider(s) and the patient.

## 2020-06-26 LAB — GLUCOSE, CAPILLARY: Glucose-Capillary: 94 mg/dL (ref 70–99)

## 2020-06-26 MED ORDER — IBUPROFEN 600 MG PO TABS: 600.0000 mg | ORAL_TABLET | Freq: Four times a day (QID) | ORAL | 0 refills | Status: DC | PRN

## 2020-06-26 MED ORDER — HYDROCODONE-ACETAMINOPHEN 5-325 MG PO TABS: 1.0000 | ORAL_TABLET | Freq: Four times a day (QID) | ORAL | 0 refills | Status: DC | PRN

## 2020-06-26 MED ORDER — AMOXICILLIN-POT CLAVULANATE 875-125 MG PO TABS: 1.0000 | ORAL_TABLET | Freq: Two times a day (BID) | ORAL | 0 refills | Status: AC

## 2020-06-26 NOTE — Plan of Care (Signed)
Patient discharging to home with family.  Reviewed drain care instructions verbally and given written instructions with log with patient and family who verbalized understanding.  Patient discharge instructions, medication changes and appointment follow-up reviewed and verbalized understanding.  No needs noted.  Piv removed prior to discharge with tip intact.

## 2020-06-26 NOTE — Progress Notes (Signed)
Wound teaching reviewed with daughter Colletta Maryland and demonstrated emptying drains successfully. Patient discharged to home.

## 2020-06-26 NOTE — Discharge Summary (Signed)
[facility] SURGICAL DISCHARGE SUMMARY  Patient ID: Phyllis Ross MRN: [id number] DOB/AGE: 11/28/1935 85 y.o.  Admit date: 06/15/2020 Discharge date: 06/26/2020  Discharge Diagnoses Patient Active Problem List   Diagnosis Date Noted  . Protein-calorie malnutrition, severe 06/24/2020  . 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 Neoplasm of lower gastrointestinal tract     Consultants Interventional Radiology  Procedures 06/15/2020:  Attempted robotic assisted laparoscopic right colectomy converted to open procedure   HPI: Phyllis Ross is a 85 y.o. 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   Hospital Course: Informed consent was obtained and documented, and patient underwent uneventful attempted robotic assisted laparoscopic right colectomy converted to open procedure (Dr Christian Mate, 06/15/2020).  Post-operatively, patient did well at first. However, on post operative days 3-4 she had significant worsening of her leukocytosis but had otherwise done well clinically. She underwent repeat CT Abdomen/Pelvis on 03/18 which was concerning for intra-abdominal fluid collections (abscess vs sub acute leak). IR was consulted and she underwent CT Guided Percutaneous Drainage on 03/18 with Dr Anselm Pancoast. Cx from this ultimately grew out multiple organisms. She was started on Zosyn at this time as well. Again, she had done well clinically and had initial improvement in her leukocytosis. However, on POD8-10, she again had a very slight uptrend in her leukocytosis. As a precaution, she underwent additional CT Abdomen/Pelvis which was concerning for sub acute anastomotic leak vs abscess, but her newly placed drains seemed to be controlling this well. Throughout this entire time, she remained hemodynamically stable and  never had significant abdominal pain. She was tolerating PO and having normal, and frequent bowel function.   Around POD9-10, it became apparent that patient was becoming more depressed and defeated with her complicated course. Dr Christian Mate had a long discussion with the patient and the family regarding next course of options. We discussed potential role for surgical intervention vs continued observation and conservative management. The risks and benefits of each were discussed in great detail. The patient, and her family, wishes to go home and continue to be followed weekly. They understand there is a chance she may deteriorate at home and need to come back to the hospital, and at that time we would need to intervene. They are educated on the signs and symptoms of this.   Discharge planning was initiated accordingly with patient safely able to be discharged home with appropriate discharge instructions, antibiotics (Augmentin x7 days), pain control, and outpatient follow-up after all of her and her family's questions were answered to her expressed satisfaction.  Son at bedside at time of my evaluation. Educated on the above and outpatient care recommendations. He seemed understanding of this. All questions answered.    Discharge Condition: Good   Physical Examination:  Constitutional: alert, cooperative and no distress Respiratory: breathing non-labored at rest  Cardiovascular: regular rate and sinus rhythm  Gastrointestinal:Soft, Non-tender, non-distended, no rebound/guarding. Right anterior abdominal drain, output clearingand more serous appearing. Right gluteal drain, output serosanguinous  Integumentary:Laparoscopic and laparotomy incisionsare CDI with staples, early ecchymosis, no erythema or drainage   Allergies as of 06/26/2020   No Known Allergies     Medication List    STOP taking these medications   erythromycin base 500 MG tablet Commonly known as: E-MYCIN   neomycin 500  MG tablet Commonly known as: MYCIFRADIN     TAKE these  medications   ALPRAZolam 0.25 MG tablet Commonly known as: XANAX Take 0.25 mg by mouth daily as needed for anxiety.   amoxicillin-clavulanate 875-125 MG tablet Commonly known as: Augmentin Take 1 tablet by mouth 2 (two) times daily for 7 days.   aspirin EC 81 MG tablet Take 81 mg by mouth daily. Swallow whole.   atorvastatin 80 MG tablet Commonly known as: LIPITOR 1 tablet daily What changed:   how much to take  how to take this  when to take this  additional instructions   ferrous sulfate 325 (65 FE) MG tablet Take 325 mg by mouth daily with breakfast.   HYDROcodone-acetaminophen 5-325 MG tablet Commonly known as: NORCO/VICODIN Take 1 tablet by mouth every 6 (six) hours as needed for severe pain (breakthrough pain).   ibuprofen 600 MG tablet Commonly known as: ADVIL Take 1 tablet (600 mg total) by mouth every 6 (six) hours as needed.   lisinopril 10 MG tablet Commonly known as: ZESTRIL TAKE (1) TABLET BY MOUTH TWICE DAILY What changed:   how much to take  how to take this  when to take this  additional instructions   Sutab [phone] MG Tabs Generic drug: Sodium Sulfate-Mag Sulfate-KCl Take 376 mg by mouth as directed.         Follow-up Information    Ronny Bacon, MD. Schedule an appointment as soon as possible for a visit in 1 week(s).   Specialty: General Surgery Why: close follow up next week with Dr Christian Mate, s/p right hemicolectomy, has drains Contact information: [address] [place] [address] [phone]                Time spent on discharge management including discussion of hospital course, clinical condition, outpatient instructions, prescriptions, and follow up with the patient and members of the medical team: >30 minutes  -- Edison Simon , PA-C [facility]  06/26/2020, 10:12 AM [phone] M-F: 7am - 4pm

## 2020-06-26 NOTE — TOC Transition Note (Signed)
Transition of Care [facility]) - CM/SW Discharge Note   Patient Details  Name: Phyllis Ross MRN: [id number] Date of Birth: 1935-08-09  Transition of Care [facility]) CM/SW Contact:  Beverly Sessions, RN Phone Number: 06/26/2020, 12:04 PM   Clinical Narrative:     Patient to discharge today Family to transport [place] with [facility] notified   Final next level of care: [place] Barriers to Discharge: No Barriers Identified   Patient Goals and CMS Choice        Discharge Placement                       Discharge Plan and Services                          HH Arranged: PT,OT,RN [place]: [place] Date [facility] Agency Contacted: 06/26/20   Representative spoke with at [place]: [place] ([place]) Interventions     Readmission Risk Interventions Readmission Risk Prevention Plan 06/18/2020  Medication Screening Complete  Transportation Screening Complete  Some recent data might be hidden

## 2020-06-27 DIAGNOSIS — T8141XD Infection following a procedure, superficial incisional surgical site, subsequent encounter: Secondary | ICD-10-CM | POA: Diagnosis not present

## 2020-06-27 DIAGNOSIS — Z791 Long term (current) use of non-steroidal anti-inflammatories (NSAID): Secondary | ICD-10-CM | POA: Diagnosis not present

## 2020-06-27 DIAGNOSIS — L0291 Cutaneous abscess, unspecified: Secondary | ICD-10-CM | POA: Diagnosis not present

## 2020-06-27 DIAGNOSIS — Z79891 Long term (current) use of opiate analgesic: Secondary | ICD-10-CM | POA: Diagnosis not present

## 2020-06-27 DIAGNOSIS — Z9049 Acquired absence of other specified parts of digestive tract: Secondary | ICD-10-CM | POA: Diagnosis not present

## 2020-06-27 DIAGNOSIS — Z792 Long term (current) use of antibiotics: Secondary | ICD-10-CM | POA: Diagnosis not present

## 2020-06-27 DIAGNOSIS — C180 Malignant neoplasm of cecum: Secondary | ICD-10-CM | POA: Diagnosis not present

## 2020-06-27 DIAGNOSIS — Z4682 Encounter for fitting and adjustment of non-vascular catheter: Secondary | ICD-10-CM | POA: Diagnosis not present

## 2020-06-27 DIAGNOSIS — Z483 Aftercare following surgery for neoplasm: Secondary | ICD-10-CM | POA: Diagnosis not present

## 2020-06-29 ENCOUNTER — Telehealth: Payer: Self-pay

## 2020-06-29 ENCOUNTER — Ambulatory Visit: Payer: Self-pay | Admitting: Family Medicine

## 2020-06-29 ENCOUNTER — Ambulatory Visit: Payer: Self-pay | Admitting: *Deleted

## 2020-06-29 DIAGNOSIS — Z791 Long term (current) use of non-steroidal anti-inflammatories (NSAID): Secondary | ICD-10-CM | POA: Diagnosis not present

## 2020-06-29 DIAGNOSIS — Z483 Aftercare following surgery for neoplasm: Secondary | ICD-10-CM | POA: Diagnosis not present

## 2020-06-29 DIAGNOSIS — Z9049 Acquired absence of other specified parts of digestive tract: Secondary | ICD-10-CM | POA: Diagnosis not present

## 2020-06-29 DIAGNOSIS — Z4682 Encounter for fitting and adjustment of non-vascular catheter: Secondary | ICD-10-CM | POA: Diagnosis not present

## 2020-06-29 DIAGNOSIS — L0291 Cutaneous abscess, unspecified: Secondary | ICD-10-CM | POA: Diagnosis not present

## 2020-06-29 DIAGNOSIS — T8141XD Infection following a procedure, superficial incisional surgical site, subsequent encounter: Secondary | ICD-10-CM | POA: Diagnosis not present

## 2020-06-29 DIAGNOSIS — C180 Malignant neoplasm of cecum: Secondary | ICD-10-CM | POA: Diagnosis not present

## 2020-06-29 DIAGNOSIS — Z79891 Long term (current) use of opiate analgesic: Secondary | ICD-10-CM | POA: Diagnosis not present

## 2020-06-29 DIAGNOSIS — Z792 Long term (current) use of antibiotics: Secondary | ICD-10-CM | POA: Diagnosis not present

## 2020-06-29 NOTE — Telephone Encounter (Signed)
Pts daughter Arvil Chaco wanted a call back regarding trying over the counter medications, Please advise.

## 2020-06-29 NOTE — Telephone Encounter (Signed)
I spoke to son/ [place], Max/ husband and told Colletta Maryland that I have talked to both of them. I tried to call Colletta [place], initially, and got disconnected

## 2020-06-29 NOTE — Telephone Encounter (Unsigned)
Copied from [place] [id number]. Topic: Quick Communication - Home Health Verbal Orders >> Jun 29, 2020 11:47 AM Tessa Lerner A wrote: Caller/Agency: Leory Plowman  Callback Number: [phone]  Requesting OT/PT/Skilled Nursing/Social Work/Speech Therapy: OT  Frequency: 1w4

## 2020-06-29 NOTE — Telephone Encounter (Signed)
Called son and husband- it is more than likely the antinbiotic causing this. Pick up Imodium AD and take as directed. She needs to try and take the antibiotic to prevent infection s/p surgery. If not any improvement in next couple of days- Dr Christian Mate should be notified. Family is aware

## 2020-06-29 NOTE — Telephone Encounter (Signed)
(  Request xanax refill- last filled - 6/21) Patient's daughter is calling to report her mother came home from hospital on Friday and has had diarrhea since. Patient has decreased appetite but they report she has good fluid intake. Patient is taking antibiotic and has not tried anything for diarrhea. Appointment scheduled for evaluation.  Reason for Disposition . [1] MODERATE diarrhea (e.g., 4-6 times / day more than normal) AND [2] age > 70 years  Answer Assessment - Initial Assessment Questions 1. DIARRHEA SEVERITY: "How bad is the diarrhea?" "How many extra stools have you had in the past 24 hours than normal?"    - NO DIARRHEA (SCALE 0)   - MILD (SCALE 1-3): Few loose or mushy BMs; increase of 1-3 stools over normal daily number of stools; mild increase in ostomy output.   -  MODERATE (SCALE 4-7): Increase of 4-6 stools daily over normal; moderate increase in ostomy output. * SEVERE (SCALE 8-10; OR 'WORST POSSIBLE'): Increase of 7 or more stools daily over normal; moderate increase in ostomy output; incontinence.     moderate 2. ONSET: "When did the diarrhea begin?"      In hospital and continued after released Friday 3. BM CONSISTENCY: "How loose or watery is the diarrhea?"      Watery stool 4. VOMITING: "Are you also vomiting?" If Yes, ask: "How many times in the past 24 hours?"      No vomiting 5. ABDOMINAL PAIN: "Are you having any abdominal pain?" If Yes, ask: "What does it feel like?" (e.g., crampy, dull, intermittent, constant)      no 6. ABDOMINAL PAIN SEVERITY: If present, ask: "How bad is the pain?"  (e.g., Scale 1-10; mild, moderate, or severe)   - MILD (1-3): doesn't interfere with normal activities, abdomen soft and not tender to touch    - MODERATE (4-7): interferes with normal activities or awakens from sleep, tender to touch    - SEVERE (8-10): excruciating pain, doubled over, unable to do any normal activities       no 7. ORAL INTAKE: If vomiting, "Have you been able to  drink liquids?" "How much fluids have you had in the past 24 hours?"     Able to intake liquids 8. HYDRATION: "Any signs of dehydration?" (e.g., dry mouth [not just dry lips], too weak to stand, dizziness, new weight loss) "When did you last urinate?"     hydrated 9. EXPOSURE: "Have you traveled to a foreign country recently?" "Have you been exposed to anyone with diarrhea?" "Could you have eaten any food that was spoiled?"     Recent hospitalization- surgery- 3/14 open total colectomy  10. ANTIBIOTIC USE: "Are you taking antibiotics now or have you taken antibiotics in the past 2 months?"       Patient is still using amoxicillin  11. OTHER SYMPTOMS: "Do you have any other symptoms?" (e.g., fever, blood in stool)      no 12. PREGNANCY: "Is there any chance you are pregnant?" "When was your last menstrual period?"       n/a  Protocols used: DIARRHEA-A-AH

## 2020-06-29 NOTE — Telephone Encounter (Signed)
Called and left message on Stephanie's machine- faxed orders at 120 this afternoon. If needing anything else, please call back

## 2020-06-29 NOTE — Telephone Encounter (Signed)
Please disregard previous message. CRM sent.

## 2020-06-29 NOTE — Telephone Encounter (Unsigned)
Copied from [place] [id number]. Topic: General - Other >> Jun 29, 2020  1:47 PM Pawlus, Brayton Layman A wrote: Reason for CRM: Pts daughter Arvil Chaco wanted a call back regarding trying over the counter medications and the Pts appt today, Please advise.

## 2020-07-02 ENCOUNTER — Ambulatory Visit (INDEPENDENT_AMBULATORY_CARE_PROVIDER_SITE_OTHER): Payer: Medicare PPO | Admitting: Surgery

## 2020-07-02 ENCOUNTER — Other Ambulatory Visit: Payer: Self-pay

## 2020-07-02 VITALS — BP 99/65 | HR 86 | Temp 98.1°F

## 2020-07-02 DIAGNOSIS — Z9049 Acquired absence of other specified parts of digestive tract: Secondary | ICD-10-CM | POA: Insufficient documentation

## 2020-07-02 DIAGNOSIS — C180 Malignant neoplasm of cecum: Secondary | ICD-10-CM

## 2020-07-02 MED ORDER — MEGESTROL ACETATE 40 MG PO TABS: 40.0000 mg | ORAL_TABLET | Freq: Two times a day (BID) | ORAL | 1 refills | Status: DC

## 2020-07-02 NOTE — Patient Instructions (Addendum)
Try adding a probiotic Lactobacillus to the diet.  We have sent in a prescription for Megace this is an appetite stimulant.  You can try adding ice cream or protein powder to shakes. Protein will help with recovery.  You may take the Imodium as needed to help with the loose stools.   May use Desitin or diaper cream to help protect the skin.   Follow up here in 2 weeks.

## 2020-07-02 NOTE — Progress Notes (Signed)
[facility] POST-OP OFFICE VISIT  07/02/2020  HPI: Phyllis Ross is a 85 y.o. female 17 days s/p right hemicolectomy.  She has 2 drains neither are putting out any significant volume of fluid or discharge.  Pelvic is still with clear serous fluid in the tubing.  The anterior abdominal is opaque drainage, but minimal.  Both sites have no complaint of tenderness.  She reports mostly that she is not eating much, minimal appetite.  Just brushing her teeth makes her feel nauseated.  She does report that she still has hyperactive bowel activity, with watery stools.  She does like chocolate Ensure, and seems to be staying well-hydrated, enjoying cool water.  She seems markedly discouraged due to her diet physical deterioration.  Vital signs: Temp 98.1 F (36.7 C)    Physical Exam: Constitutional: Her countenance is diminished, she seems quite frail with mobility but is steady and transition to the exam table. Abdomen: Soft and benign.  There is no remarkable tenderness.  Remove the anterior drain and the right buttock drain. Skin: Intact, without erythema discharge or induration. There is no evidence of oral thrush.  I do not appreciate any oral or pharyngeal ulcerations.  Assessment/Plan: This is a 85 y.o. female 17 days s/p right hemicolectomy.  We discussed the role of further lab testing and CT scanning.  She is not interested in pursuing either at this point in time.  Her daughters present along with her husband and a caregiver.  Patient Active Problem List   Diagnosis Date Noted  . Protein-calorie malnutrition, severe 06/24/2020  . 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 Iron deficiency anemia due to chronic blood loss   . Neoplasm of lower gastrointestinal tract   . Acute gastritis with hemorrhage   . Personal history of other malignant neoplasm of skin 01/21/2019  . Atherosclerosis of abdominal aorta ([place]) 12/26/2017  . Age-related osteoporosis without current pathological  fracture 06/16/2016  . Encounter for follow-up surveillance of ovarian cancer 05/20/2015  . 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  . Wedging of vertebra ([place]) 10/13/2014  . Polypharmacy 10/13/2014  . Encounter for general adult medical examination without abnormal findings 10/13/2014  . Anxiety 10/13/2014  . Essential hypertension 08/27/2014  . Bradycardia 08/27/2014  . Coronary artery disease of native heart with stable angina pectoris ([place]) 08/27/2014  . MI (mitral incompetence) 08/27/2014  . TI (tricuspid incompetence) 08/27/2014  . Combined fat and carbohydrate induced hyperlipemia 08/13/2014  . HTN (hypertension), malignant 08/11/2014  . Chest pain 08/11/2014  . CAD (coronary artery disease) of artery bypass graft 08/11/2014    -We discussed the roles of an appetite stimulants like Megace, and utilization of probiotics.   Ronny Bacon M.D., FACS 07/02/2020, 4:00 PM

## 2020-07-03 ENCOUNTER — Encounter: Payer: Self-pay | Admitting: Surgery

## 2020-07-07 DIAGNOSIS — T8141XD Infection following a procedure, superficial incisional surgical site, subsequent encounter: Secondary | ICD-10-CM | POA: Diagnosis not present

## 2020-07-07 DIAGNOSIS — Z9049 Acquired absence of other specified parts of digestive tract: Secondary | ICD-10-CM | POA: Diagnosis not present

## 2020-07-07 DIAGNOSIS — L0291 Cutaneous abscess, unspecified: Secondary | ICD-10-CM | POA: Diagnosis not present

## 2020-07-07 DIAGNOSIS — Z79891 Long term (current) use of opiate analgesic: Secondary | ICD-10-CM | POA: Diagnosis not present

## 2020-07-07 DIAGNOSIS — Z483 Aftercare following surgery for neoplasm: Secondary | ICD-10-CM | POA: Diagnosis not present

## 2020-07-07 DIAGNOSIS — Z792 Long term (current) use of antibiotics: Secondary | ICD-10-CM | POA: Diagnosis not present

## 2020-07-07 DIAGNOSIS — Z4682 Encounter for fitting and adjustment of non-vascular catheter: Secondary | ICD-10-CM | POA: Diagnosis not present

## 2020-07-07 DIAGNOSIS — Z791 Long term (current) use of non-steroidal anti-inflammatories (NSAID): Secondary | ICD-10-CM | POA: Diagnosis not present

## 2020-07-07 DIAGNOSIS — C180 Malignant neoplasm of cecum: Secondary | ICD-10-CM | POA: Diagnosis not present

## 2020-07-08 DIAGNOSIS — Z79891 Long term (current) use of opiate analgesic: Secondary | ICD-10-CM | POA: Diagnosis not present

## 2020-07-08 DIAGNOSIS — Z9049 Acquired absence of other specified parts of digestive tract: Secondary | ICD-10-CM | POA: Diagnosis not present

## 2020-07-08 DIAGNOSIS — T8141XD Infection following a procedure, superficial incisional surgical site, subsequent encounter: Secondary | ICD-10-CM | POA: Diagnosis not present

## 2020-07-08 DIAGNOSIS — C180 Malignant neoplasm of cecum: Secondary | ICD-10-CM | POA: Diagnosis not present

## 2020-07-08 DIAGNOSIS — L0291 Cutaneous abscess, unspecified: Secondary | ICD-10-CM | POA: Diagnosis not present

## 2020-07-08 DIAGNOSIS — Z791 Long term (current) use of non-steroidal anti-inflammatories (NSAID): Secondary | ICD-10-CM | POA: Diagnosis not present

## 2020-07-08 DIAGNOSIS — Z792 Long term (current) use of antibiotics: Secondary | ICD-10-CM | POA: Diagnosis not present

## 2020-07-08 DIAGNOSIS — Z4682 Encounter for fitting and adjustment of non-vascular catheter: Secondary | ICD-10-CM | POA: Diagnosis not present

## 2020-07-08 DIAGNOSIS — Z483 Aftercare following surgery for neoplasm: Secondary | ICD-10-CM | POA: Diagnosis not present

## 2020-07-10 ENCOUNTER — Encounter: Payer: Self-pay | Admitting: Family Medicine

## 2020-07-10 DIAGNOSIS — Z9049 Acquired absence of other specified parts of digestive tract: Secondary | ICD-10-CM | POA: Diagnosis not present

## 2020-07-10 DIAGNOSIS — Z483 Aftercare following surgery for neoplasm: Secondary | ICD-10-CM | POA: Diagnosis not present

## 2020-07-10 DIAGNOSIS — Z79891 Long term (current) use of opiate analgesic: Secondary | ICD-10-CM | POA: Diagnosis not present

## 2020-07-10 DIAGNOSIS — C180 Malignant neoplasm of cecum: Secondary | ICD-10-CM | POA: Diagnosis not present

## 2020-07-10 DIAGNOSIS — T8141XD Infection following a procedure, superficial incisional surgical site, subsequent encounter: Secondary | ICD-10-CM | POA: Diagnosis not present

## 2020-07-10 DIAGNOSIS — Z4682 Encounter for fitting and adjustment of non-vascular catheter: Secondary | ICD-10-CM | POA: Diagnosis not present

## 2020-07-10 DIAGNOSIS — Z792 Long term (current) use of antibiotics: Secondary | ICD-10-CM | POA: Diagnosis not present

## 2020-07-10 DIAGNOSIS — L0291 Cutaneous abscess, unspecified: Secondary | ICD-10-CM | POA: Diagnosis not present

## 2020-07-10 DIAGNOSIS — Z791 Long term (current) use of non-steroidal anti-inflammatories (NSAID): Secondary | ICD-10-CM | POA: Diagnosis not present

## 2020-07-13 ENCOUNTER — Telehealth: Payer: Self-pay

## 2020-07-13 NOTE — Telephone Encounter (Signed)
Copied from [place] [phone]. Topic: General - Other >> Jul 13, 2020  3:54 PM Tessa Lerner A wrote: Reason for CRM: Patient would like to be contacted by "Baxter Flattery" when possible  Patient has concerns related to an appt scheduled for 07/16/20 with PCP  Patient declined to elaborate on concerns at the time of call with agent  Please contact to further advise when possible

## 2020-07-14 ENCOUNTER — Other Ambulatory Visit: Payer: Self-pay

## 2020-07-14 DIAGNOSIS — G4701 Insomnia due to medical condition: Secondary | ICD-10-CM

## 2020-07-14 MED ORDER — TRAZODONE HCL 50 MG PO TABS: 25.0000 mg | ORAL_TABLET | Freq: Every evening | ORAL | 0 refills | Status: DC | PRN

## 2020-07-14 NOTE — Telephone Encounter (Signed)
Sent in trazodone and will see on April 18th

## 2020-07-14 NOTE — Progress Notes (Signed)
Sent in trazodone to [facility]- will see if follow up on 18th

## 2020-07-15 DIAGNOSIS — C180 Malignant neoplasm of cecum: Secondary | ICD-10-CM | POA: Diagnosis not present

## 2020-07-15 DIAGNOSIS — Z791 Long term (current) use of non-steroidal anti-inflammatories (NSAID): Secondary | ICD-10-CM | POA: Diagnosis not present

## 2020-07-15 DIAGNOSIS — Z79891 Long term (current) use of opiate analgesic: Secondary | ICD-10-CM | POA: Diagnosis not present

## 2020-07-15 DIAGNOSIS — Z4682 Encounter for fitting and adjustment of non-vascular catheter: Secondary | ICD-10-CM | POA: Diagnosis not present

## 2020-07-15 DIAGNOSIS — Z792 Long term (current) use of antibiotics: Secondary | ICD-10-CM | POA: Diagnosis not present

## 2020-07-15 DIAGNOSIS — Z483 Aftercare following surgery for neoplasm: Secondary | ICD-10-CM | POA: Diagnosis not present

## 2020-07-15 DIAGNOSIS — L0291 Cutaneous abscess, unspecified: Secondary | ICD-10-CM | POA: Diagnosis not present

## 2020-07-15 DIAGNOSIS — Z9049 Acquired absence of other specified parts of digestive tract: Secondary | ICD-10-CM | POA: Diagnosis not present

## 2020-07-15 DIAGNOSIS — T8141XD Infection following a procedure, superficial incisional surgical site, subsequent encounter: Secondary | ICD-10-CM | POA: Diagnosis not present

## 2020-07-16 ENCOUNTER — Ambulatory Visit: Payer: Self-pay | Admitting: Family Medicine

## 2020-07-16 ENCOUNTER — Encounter: Payer: Self-pay | Admitting: Surgery

## 2020-07-16 ENCOUNTER — Ambulatory Visit (INDEPENDENT_AMBULATORY_CARE_PROVIDER_SITE_OTHER): Payer: Medicare PPO | Admitting: Surgery

## 2020-07-16 ENCOUNTER — Other Ambulatory Visit: Payer: Self-pay

## 2020-07-16 VITALS — BP 95/65 | HR 78 | Temp 97.9°F | Ht 65.0 in | Wt 108.8 lb

## 2020-07-16 DIAGNOSIS — C180 Malignant neoplasm of cecum: Secondary | ICD-10-CM

## 2020-07-16 MED ORDER — ONDANSETRON 4 MG PO TBDP: 4.0000 mg | ORAL_TABLET | Freq: Three times a day (TID) | ORAL | 0 refills | Status: DC | PRN

## 2020-07-16 NOTE — Patient Instructions (Addendum)
Pick up your prescription at your pharmacy. Please give [place] a call if you have any questions or concerns.

## 2020-07-17 DIAGNOSIS — L0291 Cutaneous abscess, unspecified: Secondary | ICD-10-CM | POA: Diagnosis not present

## 2020-07-17 DIAGNOSIS — T8141XD Infection following a procedure, superficial incisional surgical site, subsequent encounter: Secondary | ICD-10-CM | POA: Diagnosis not present

## 2020-07-17 DIAGNOSIS — C180 Malignant neoplasm of cecum: Secondary | ICD-10-CM | POA: Diagnosis not present

## 2020-07-17 DIAGNOSIS — Z483 Aftercare following surgery for neoplasm: Secondary | ICD-10-CM | POA: Diagnosis not present

## 2020-07-17 DIAGNOSIS — Z9049 Acquired absence of other specified parts of digestive tract: Secondary | ICD-10-CM | POA: Diagnosis not present

## 2020-07-17 DIAGNOSIS — Z792 Long term (current) use of antibiotics: Secondary | ICD-10-CM | POA: Diagnosis not present

## 2020-07-17 DIAGNOSIS — Z4682 Encounter for fitting and adjustment of non-vascular catheter: Secondary | ICD-10-CM | POA: Diagnosis not present

## 2020-07-17 DIAGNOSIS — Z79891 Long term (current) use of opiate analgesic: Secondary | ICD-10-CM | POA: Diagnosis not present

## 2020-07-17 DIAGNOSIS — Z791 Long term (current) use of non-steroidal anti-inflammatories (NSAID): Secondary | ICD-10-CM | POA: Diagnosis not present

## 2020-07-17 NOTE — Progress Notes (Signed)
Mrs. Devin presents out of convenience with her husband today.  She is currently 1 month out from her right hemicolectomy. Overall she appears to be doing quite well from a surgical perspective, denying any fevers or chills.  She still has some loose bowel movements, however she seems to be improving in her oral intake.  She still suffers from some mild nausea and anorexia.  Certain foods and brushing her teeth seem to make her more nauseated.  Apparently she has not been utilizing Zofran.  But she is eating of a large variety of things and seems to be enjoying more foods. She appears to be maintaining her weight.  But clearly everyone in the room is more concerned about her continued suboptimal nutritional status. Her abdomen remains benign she still has her Steri-Strips in place.  The drain sites do not appear to be an issue. We will try some Zofran hopefully to help her with the nausea.  I do not want a make too many changes with the Megace at the same time.  Family asked about following up with the oncology referral and although she may never return to her full vigorous baseline, we will proceed with this.

## 2020-07-20 ENCOUNTER — Other Ambulatory Visit: Payer: Self-pay

## 2020-07-20 ENCOUNTER — Encounter: Payer: Self-pay | Admitting: Family Medicine

## 2020-07-20 ENCOUNTER — Ambulatory Visit (INDEPENDENT_AMBULATORY_CARE_PROVIDER_SITE_OTHER): Payer: Medicare PPO | Admitting: Family Medicine

## 2020-07-20 VITALS — BP 120/70 | HR 76 | Ht 65.0 in | Wt 108.0 lb

## 2020-07-20 DIAGNOSIS — F419 Anxiety disorder, unspecified: Secondary | ICD-10-CM

## 2020-07-20 DIAGNOSIS — G4701 Insomnia due to medical condition: Secondary | ICD-10-CM

## 2020-07-20 MED ORDER — ALPRAZOLAM 0.25 MG PO TABS
0.2500 mg | ORAL_TABLET | Freq: Every day | ORAL | 5 refills | Status: DC | PRN
Start: 2020-07-20 — End: 2021-11-30

## 2020-07-20 MED ORDER — TRAZODONE HCL 50 MG PO TABS: 25.0000 mg | ORAL_TABLET | Freq: Every evening | ORAL | 5 refills | Status: DC | PRN

## 2020-07-20 NOTE — Progress Notes (Signed)
Date:  07/20/2020   Name:  Phyllis Ross   DOB:  29-Aug-1935   MRN:  [id number]   Chief Complaint: Follow-up (insomnia)  Anxiety Presents for follow-up visit. Symptoms include excessive worry and nervous/anxious behavior. Patient reports no chest pain, compulsions, confusion, decreased concentration, depressed mood, dizziness, dry mouth, feeling of choking, hyperventilation, impotence, insomnia, irritability, malaise, muscle tension, nausea, obsessions, palpitations, panic, restlessness, shortness of breath or suicidal ideas. Symptoms occur occasionally. The severity of symptoms is mild.    Insomnia Primary symptoms: no difficulty falling asleep.  The current episode started more than one year. The onset quality is gradual. The problem has been gradually improving since onset. Past treatments include medication. The treatment provided moderate relief. PMH includes: associated symptoms present, no hypertension, no depression, no family stress or anxiety, no restless leg syndrome, no work related stressors, no chronic pain, no apnea.     Lab Results  Component Value Date   CREATININE 0.68 06/25/2020   BUN 9 06/25/2020   NA 134 (L) 06/25/2020   K 4.5 06/25/2020   CL 102 06/25/2020   CO2 25 06/25/2020   Lab Results  Component Value Date   CHOL 152 01/29/2020   HDL 51 01/29/2020   LDLCALC 72 01/29/2020   TRIG 173 (A) 01/29/2020   CHOLHDL 2.9 12/12/2017   No results found for: TSH Lab Results  Component Value Date   HGBA1C 5.5 04/07/2020   Lab Results  Component Value Date   WBC 15.4 (H) 06/25/2020   HGB 9.0 (L) 06/25/2020   HCT 27.7 (L) 06/25/2020   MCV 63.8 (L) 06/25/2020   PLT 451 (H) 06/25/2020   Lab Results  Component Value Date   ALT 10 06/22/2020   AST 15 06/22/2020   ALKPHOS 62 06/22/2020   BILITOT 1.6 (H) 06/22/2020     Review of Systems  Constitutional: Negative.  Negative for chills, fatigue, fever, irritability and unexpected weight change.  HENT:  Negative for congestion, ear discharge, ear pain, rhinorrhea, sinus pressure, sneezing and sore throat.   Eyes: Negative for photophobia, pain, discharge, redness and itching.  Respiratory: Negative for apnea, cough, shortness of breath, wheezing and stridor.   Cardiovascular: Negative for chest pain and palpitations.  Gastrointestinal: Negative for abdominal pain, blood in stool, constipation, diarrhea, nausea and vomiting.  Endocrine: Negative for cold intolerance, heat intolerance, polydipsia, polyphagia and polyuria.  Genitourinary: Negative for dysuria, flank pain, frequency, hematuria, impotence, menstrual problem, pelvic pain, urgency, vaginal bleeding and vaginal discharge.  Musculoskeletal: Negative for arthralgias, back pain and myalgias.  Skin: Negative for rash.  Allergic/Immunologic: Negative for environmental allergies and food allergies.  Neurological: Negative for dizziness, weakness, light-headedness, numbness and headaches.  Hematological: Negative for adenopathy. Does not bruise/bleed easily.  Psychiatric/Behavioral: Negative for confusion, decreased concentration, depression, dysphoric mood and suicidal ideas. The patient is nervous/anxious. The patient does not have insomnia.     Patient Active Problem List   Diagnosis Date Noted  . Status post partial colectomy 07/02/2020  . Protein-calorie malnutrition, severe 06/24/2020  . 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 Iron deficiency anemia due to chronic blood loss   . Neoplasm of lower gastrointestinal tract   . Acute gastritis with hemorrhage   . Personal history of other malignant neoplasm of skin 01/21/2019  . Atherosclerosis of abdominal aorta ([place]) 12/26/2017  . Age-related osteoporosis without current pathological fracture 06/16/2016  . Encounter for follow-up surveillance of ovarian cancer 05/20/2015  . 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  . Wedging  of vertebra ([place]) 10/13/2014  . Polypharmacy  10/13/2014  . Encounter for general adult medical examination without abnormal findings 10/13/2014  . Anxiety 10/13/2014  . Essential hypertension 08/27/2014  . Bradycardia 08/27/2014  . Coronary artery disease of native heart with stable angina pectoris ([place]) 08/27/2014  . MI (mitral incompetence) 08/27/2014  . TI (tricuspid incompetence) 08/27/2014  . Combined fat and carbohydrate induced hyperlipemia 08/13/2014  . HTN (hypertension), malignant 08/11/2014  . Chest pain 08/11/2014  . CAD (coronary artery disease) of artery bypass graft 08/11/2014    No Known Allergies  Past Surgical History:  Procedure Laterality Date  . COLONOSCOPY  2011   normal- Dr Vira Agar  . COLONOSCOPY WITH PROPOFOL N/A 06/04/2020   Procedure: COLONOSCOPY WITH PROPOFOL;  Surgeon: Lucilla Lame, MD;  Location: [place];  Service: Endoscopy;  Laterality: N/A;  . CORONARY ARTERY BYPASS GRAFT    . ESOPHAGOGASTRODUODENOSCOPY (EGD) WITH PROPOFOL N/A 06/04/2020   Procedure: ESOPHAGOGASTRODUODENOSCOPY (EGD) WITH PROPOFOL;  Surgeon: Lucilla Lame, MD;  Location: [facility] [place];  Service: Endoscopy;  Laterality: N/A;  . VAGINAL HYSTERECTOMY     DUB    Social History   Tobacco Use  . Smoking status: Never Smoker  . Smokeless tobacco: Never Used  Vaping Use  . Vaping Use: Never used  Substance Use Topics  . Alcohol use: No  . Drug use: No     Medication list has been reviewed and updated.  Current Meds  Medication Sig  . ALPRAZolam (XANAX) 0.25 MG tablet Take 0.25 mg by mouth daily as needed for anxiety.  Marland Kitchen atorvastatin (LIPITOR) 80 MG tablet 1 tablet daily (Patient taking differently: Take 80 mg by mouth daily.)  . ferrous sulfate 325 (65 FE) MG tablet Take 325 mg by mouth daily with breakfast.  . ibuprofen (ADVIL) 600 MG tablet Take 1 tablet (600 mg total) by mouth every 6 (six) hours as needed.  Marland Kitchen lisinopril (ZESTRIL) 10 MG tablet TAKE (1) TABLET BY MOUTH TWICE DAILY (Patient taking  differently: Take 10 mg by mouth 2 (two) times daily.)  . loperamide (IMODIUM) 2 MG capsule Take 2 mg by mouth as needed for diarrhea or loose stools.  . megestrol (MEGACE) 40 MG tablet Take 1 tablet (40 mg total) by mouth 2 (two) times daily.  . traZODone (DESYREL) 50 MG tablet Take 0.5-1 tablets (25-50 mg total) by mouth at bedtime as needed for sleep.  . [DISCONTINUED] aspirin EC 81 MG tablet Take 81 mg by mouth daily. Swallow whole.    PHQ 2/9 Scores 07/20/2020 02/03/2020 09/24/2019 03/21/2019  PHQ - 2 Score 0 0 0 0  PHQ- 9 Score 5 - 0 0    GAD 7 : Generalized Anxiety Score 07/20/2020 09/24/2019 03/21/2019  Nervous, Anxious, on Edge 2 0 0  Control/stop worrying 2 0 0  Worry too much - different things 2 0 0  Trouble relaxing 0 0 0  Restless 0 0 0  Easily annoyed or irritable 1 0 0  Afraid - awful might happen 1 0 0  Total GAD 7 Score 8 0 0  Anxiety Difficulty Not difficult at all - -    BP Readings from Last 3 Encounters:  07/20/20 120/70  07/16/20 95/65  07/02/20 99/65    Physical Exam Vitals and nursing note reviewed.  Constitutional:      Appearance: She is well-developed.  HENT:     Head: Normocephalic.     Right Ear: Tympanic membrane, ear canal and external ear normal.  Left Ear: Tympanic membrane, ear canal and external ear normal.     Nose: Nose normal. No congestion or rhinorrhea.     Mouth/Throat:     Mouth: Mucous membranes are moist.  Eyes:     General: Lids are everted, no foreign bodies appreciated. No scleral icterus.       Left eye: No foreign body or hordeolum.     Conjunctiva/sclera: Conjunctivae normal.     Right eye: Right conjunctiva is not injected.     Left eye: Left conjunctiva is not injected.     Pupils: Pupils are equal, round, and reactive to light.  Neck:     Thyroid: No thyromegaly.     Vascular: No JVD.     Trachea: No tracheal deviation.  Cardiovascular:     Rate and Rhythm: Normal rate and regular rhythm.     Heart sounds:  Normal heart sounds. No murmur heard. No friction rub. No gallop.   Pulmonary:     Effort: Pulmonary effort is normal. No respiratory distress.     Breath sounds: Normal breath sounds. No wheezing, rhonchi or rales.  Abdominal:     General: Bowel sounds are normal. There is no distension.     Palpations: Abdomen is soft. There is no mass.     Tenderness: There is no abdominal tenderness. There is no right CVA tenderness, left CVA tenderness, guarding or rebound.     Hernia: No hernia is present.  Musculoskeletal:        General: No tenderness. Normal range of motion.     Cervical back: Normal range of motion and neck supple.  Lymphadenopathy:     Cervical: No cervical adenopathy.  Skin:    General: Skin is warm.     Capillary Refill: Capillary refill takes less than 2 seconds.     Findings: No rash.  Neurological:     Mental Status: She is alert and oriented to person, place, and time.     Cranial Nerves: No cranial nerve deficit.     Deep Tendon Reflexes: Reflexes normal.  Psychiatric:        Mood and Affect: Mood is not anxious or depressed.     Wt Readings from Last 3 Encounters:  07/20/20 108 lb (49 kg)  07/16/20 108 lb 12.8 oz (49.4 kg)  06/16/20 119 lb 14.9 oz (54.4 kg)    BP 120/70   Pulse 76   Ht 5\' 5"  (1.651 m)   Wt 108 lb (49 kg)   BMI 17.97 kg/m   Assessment and Plan:

## 2020-07-21 ENCOUNTER — Telehealth: Payer: Self-pay | Admitting: Hematology and Oncology

## 2020-07-21 NOTE — Telephone Encounter (Signed)
07/21/2020 Attempted to call pt x2 yesterday, went straight to VM. Called pt again this morning and left another VM to inform her of appt w/ Dr. Loletha Grayer on 4/21 SRW

## 2020-07-22 DIAGNOSIS — Z792 Long term (current) use of antibiotics: Secondary | ICD-10-CM | POA: Diagnosis not present

## 2020-07-22 DIAGNOSIS — T8141XD Infection following a procedure, superficial incisional surgical site, subsequent encounter: Secondary | ICD-10-CM | POA: Diagnosis not present

## 2020-07-22 DIAGNOSIS — Z79891 Long term (current) use of opiate analgesic: Secondary | ICD-10-CM | POA: Diagnosis not present

## 2020-07-22 DIAGNOSIS — L0291 Cutaneous abscess, unspecified: Secondary | ICD-10-CM | POA: Diagnosis not present

## 2020-07-22 DIAGNOSIS — C180 Malignant neoplasm of cecum: Secondary | ICD-10-CM | POA: Diagnosis not present

## 2020-07-22 DIAGNOSIS — Z9049 Acquired absence of other specified parts of digestive tract: Secondary | ICD-10-CM | POA: Diagnosis not present

## 2020-07-22 DIAGNOSIS — Z791 Long term (current) use of non-steroidal anti-inflammatories (NSAID): Secondary | ICD-10-CM | POA: Diagnosis not present

## 2020-07-22 DIAGNOSIS — Z483 Aftercare following surgery for neoplasm: Secondary | ICD-10-CM | POA: Diagnosis not present

## 2020-07-22 DIAGNOSIS — Z4682 Encounter for fitting and adjustment of non-vascular catheter: Secondary | ICD-10-CM | POA: Diagnosis not present

## 2020-07-22 NOTE — Progress Notes (Signed)
[facility]  [address], [address] [place], [place] [address] Phone: [phone]  Fax: [phone]   Clinic Day:  07/23/2020  Referring physician: Juline Patch, MD  Chief Complaint: Phyllis Ross is a 85 y.o. female with stage IIIC cecal cancer s/p hemicolectomy who is referred in consultation by Dr. Otilio Miu for assessment and management.  HPI:   She was noted to have microcytic anemia. She has beta thalassemia. CBC on 01/29/2020 revealed a hematocrit of 32 and a hemoglobin of 9.4.  She denied any bleeding.  Guaiac cards x3 were positive on 03/02/2020.  Colonoscopy on 06/04/2020 by Dr. Allen Norris revealed a likely malignant partially obstructing tumor in the cecum (tubular adenoma with high grade dysplasia at least). There were non-bleeding internal hemorrhoids. EGD revealed gastritis with hemorrhage. Pathology showed chronic active gastritis with surface erosion and features of healing mucosal injury.  Abdomen and pelvis CT on 06/10/2020 revealed a 5.7 cm cecal mass, suspicious for colonic neoplasm such as adenocarcinoma. There were associated ileocolic nodal metastases measuring up to 2.0 cm short axis.   The patient underwent robot assisted right colectomy/conversion to open colectomy on 06/15/2020 by Dr. Christian Mate. EBL was 100 mL. Right colon pathology revealed a 5.2 x 4.3 x 2.7 cm grade 2 moderately differentiated adenocarcinoma, invasive through muscularis propria into pericolorectal tissue. Seven of 27 lymph nodes were positive. There was lymph-vascular invasion, but no perineural invasion.  Margins were negative.  MMR was negative (low probability of MSI-H).  Pathologic stage was pT3N2bMx.  Right hemicolectomy was complicated by anastomotic breakdown/leak vs abscess.  Drain was placed on 06/19/2020.  Culture grew multiple organisms.  She was treated with Zosyn then discharged on Augmentin on 06/25/2020. She went home with the drains and they were removed  on 07/02/2020.  Abdomen and pelvis CT on 06/19/2020 revealed s/p right hemicolectomy with ileocolic anastomosis. Contrast material was seen tracking through the ileocolic anastomosis. Three loculated fluid collections were identified in the anterior abdomen. Each had a thin rim of subtle enhancement. While postoperative day 4 is on the early side for abscess formation, imaging features were suspicious for superinfection of these collections and evolving abscess formation. There was small to moderate volume free fluid in the pelvis. There was scattered small volume intraperitoneal free air and air tracking in the abdominal wall is not unexpected on postoperative day. Gas in the bladder lumen suggested recent instrumentation. Colon appeared to be fluid-filled to the level of the rectum suggesting diarrhea. 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 There was a 0.9 x 1.0 cm nonobstructing stone in the right renal pelvis. There was a 6.5 cm simple cyst interpolar left kidney. There was aortic atherosclerosis.  Abdomen and pelvis CT on 06/24/2020 revealed s/p right hemicolectomy with evidence of persistent anastomotic leak with multiple intra-abdominal abscesses and 2 new pigtail drainage catheters in place. There was resolving subcutaneous emphysema in the right chest wall, anterior abdominal wall, and right upper thigh. There was decreasing postoperative pneumoperitoneum. There was persistent gas in the lumen of the urinary bladder over the past 5 days, relatively unchanged. This would be unusual, and could be iatrogenic if there has been recent catheterization. In the absence of recent catheterization, correlation with urinalysis was recommended to exclude the possibility of urinary tract infection  with gas-forming organisms. There was aortic atherosclerosis. There were additional incidental findings, similar to recent prior study, noted in report.  Tumor Board on 06/25/2020  recommended chemotherapy.  The patient saw Dr. Ronny Bacon on 07/16/2020 for a 1 month post-op visit. She was doing well. She has some loose bowel movements and nausea but was improving her oral intake. She was prescribed Zofran.  Labs followed: 09/23/2011: Hematocrit 37.3, hemoglobin 11.5, MCV 68.0, platelets 220,000, WBC   9,600. 06/22/2012: Hematocrit 38.2, hemoglobin 11.5, MCV 66.0, platelets 271,000, WBC   8,500. 08/11/2014: Hematocrit 38.5, hemoglobin 11.9, MCV 68.7, platelets 253,000, WBC 11,400. 01/29/2020: Hematocrit 32.0, hemoglobin   9.4, MCV -----, platelets 317,000, WBC   9,800. 04/07/2020: Hematocrit 37.3, hemoglobin 10.9, MCV 67.0, platelets 302,000, WBC   7,900. 06/12/2020: Hematocrit 36.5, hemoglobin 11.1, MCV 67.6, platelets 313,000, WBC   8,300. 06/25/2020: Hematocrit 27.7, hemoglobin   9.0, MCV 63.8, platelets 451,000, WBC 15,400.  The patient has been on oral iron once daily for "a long time."  Symptomatically, her appetite is poor. She has rare episodes of a good appetite. She usually drinks Ensure once per day. When she eats, she "feels like foods get bigger in her mouth" which makes it difficult for her to swallow. She is interested in speaking to [name], RD.  The patient has some nausea after brushing her teeth. She stays cold. She has about 3 bowel movements per day, but this has decreased in the past few days. She had two bowel movements yesterday and one this morning at 4 am.  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 Her hands are shaky. Her baseline weight is 120 lbs. She is 107 lbs today.  She denies fevers, sweats, headaches, changes in vision, runny nose, sore throat, cough, shortness of breath, chest pain, palpitations, vomiting, diarrhea, reflux, urinary symptoms, bone or joint symptoms, numbness, weakness, balance or coordination problems, falls, and bleeding of any kind.  She notes that everyone at  the hospital had trouble drawing her blood.  The patient has 3 rotating caregivers that are with her 24/7. [name] is with her for 6-8 hours per day. Right after discharge, she needed assistance using the bathroom. She is able to do that by herself now. Her caregivers help with PT/OT, getting dressed, showers, meal prep, and household chores. She sees PT once per week and finish OT yesterday. She has gotten much stronger since leaving the hospital. The patient spends almost all of the day sitting or laying. She watches TV, reads the newspaper, and interacts with visitors. Previously, she lived alone with her husband.  Her brother had colon cancer. Her daughter and son have beta thalassemia.  The patient is followed by Dr. 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 She completed 6 cycles of paclitaxel and carboplatin in 04/2004. She was last seen by Dr Theora Gianotti on 11/06/2019.  She denied any complaints.  Exam was negative.  She has a follow-up in 1 year.  CA 125 was 25 on 05/15/2017, 26.7 on 11/07/2018, and 35.4 on 11/06/2019.  The patient is planning on going to the beach from 08/08/2020 - 08/15/2020.   Past Medical History:  Diagnosis Date  . Anemia   . Anxiety   . Cancer ([place])   . Hyperlipidemia   . Hypertension   . MI (myocardial infarction) ([place]) 2008   triple bypass  . Osteoporosis   . Pneumonia    as a baby  . Rheumatic fever     Past Surgical History:  Procedure Laterality Date  . COLONOSCOPY  2011   normal- Dr Vira Agar  .  COLONOSCOPY WITH PROPOFOL N/A 06/04/2020   Procedure: COLONOSCOPY WITH PROPOFOL;  Surgeon: Lucilla Lame, MD;  Location: [facility];  Service: Endoscopy;  Laterality: N/A;  . CORONARY ARTERY BYPASS GRAFT    . ESOPHAGOGASTRODUODENOSCOPY (EGD) WITH PROPOFOL N/A 06/04/2020   Procedure: ESOPHAGOGASTRODUODENOSCOPY (EGD) WITH PROPOFOL;  Surgeon: Lucilla Lame, MD;  Location: [facility];  Service:  Endoscopy;  Laterality: N/A;  . VAGINAL HYSTERECTOMY     DUB    Family History  Problem Relation Age of Onset  . Heart attack Mother   . Heart attack Maternal Uncle   . Heart attack Maternal Uncle     Social History:  reports that she has never smoked. She has never used smokeless tobacco. She reports that she does not drink alcohol and does not use drugs. She denies alcohol and tobacco use. She loves to play sports and gave up softball at 85 years old. She used to work in the court system. She lives in [place]. Her daughter is [place]. She also had a son. Her caregiver is Secondary school teacher. The patient is accompanied by [place] today.  Allergies: No Known Allergies  Current Medications: Current Outpatient Medications  Medication Sig Dispense Refill  . atorvastatin (LIPITOR) 80 MG tablet 1 tablet daily (Patient taking differently: Take 80 mg by mouth daily.) 90 tablet 1  . ferrous sulfate 325 (65 FE) MG tablet Take 325 mg by mouth daily with breakfast.    . lisinopril (ZESTRIL) 10 MG tablet TAKE (1) TABLET BY MOUTH TWICE DAILY (Patient taking differently: Take 10 mg by mouth 2 (two) times daily.) 180 tablet 1  . loperamide (IMODIUM) 2 MG capsule Take 2 mg by mouth as needed for diarrhea or loose stools.    . megestrol (MEGACE) 40 MG tablet Take 1 tablet (40 mg total) by mouth 2 (two) times daily. 60 tablet 1  . traZODone (DESYREL) 50 MG tablet Take 0.5-1 tablets (25-50 mg total) by mouth at bedtime as needed for sleep. 30 tablet 5  . ALPRAZolam (XANAX) 0.25 MG tablet Take 1 tablet (0.25 mg total) by mouth daily as needed for anxiety. (Patient not taking: Reported on 07/23/2020) 30 tablet 5  . HYDROcodone-acetaminophen (NORCO/VICODIN) 5-325 MG tablet Take 1 tablet by mouth every 6 (six) hours as needed for severe pain (breakthrough pain). (Patient not taking: No sig reported) 15 tablet 0  . ibuprofen (ADVIL) 600 MG tablet Take 1 tablet (600 mg total) by mouth every 6 (six) hours as  needed. (Patient not taking: Reported on 07/23/2020) 30 tablet 0  . ondansetron (ZOFRAN ODT) 4 MG disintegrating tablet Take 1 tablet (4 mg total) by mouth every 8 (eight) hours as needed for nausea or vomiting. (Patient not taking: No sig reported) 30 tablet 0  . Sodium Sulfate-Mag Sulfate-KCl (SUTAB) [phone] MG TABS Take 376 mg by mouth as directed. (Patient not taking: No sig reported) 24 tablet 0   No current facility-administered medications for this visit.    Review of Systems  Constitutional: Negative for chills, diaphoresis, fever, malaise/fatigue and weight loss.  HENT: Negative for congestion, ear discharge, ear pain, hearing loss, nosebleeds, sinus pain, sore throat and tinnitus.   Eyes: Negative for blurred vision.  Respiratory: Negative for cough, hemoptysis, sputum production and shortness of breath.   Cardiovascular: Negative for chest pain, palpitations and leg swelling.  Gastrointestinal: Positive for nausea (after brushing her teeth). Negative for abdominal pain, blood in stool, constipation, diarrhea, heartburn, melena and vomiting.       Poor  appetite.  Genitourinary: Negative for dysuria, frequency, hematuria and urgency.  Musculoskeletal: Negative for back pain, falls, joint pain, myalgias and neck pain.  Skin: Negative for itching and rash.       H/o multiple squamous cell carcinomas.  Neurological: Negative for dizziness, tingling, sensory change, weakness and headaches.       Hands shake.  Endo/Heme/Allergies: Does not bruise/bleed easily.       Stays cold  Psychiatric/Behavioral: Negative for depression and memory loss. The patient is not nervous/anxious and does not have insomnia.   All other systems reviewed and are negative.  Performance status (ECOG): 3  Vitals Blood pressure 106/74, pulse 65, temperature 98.2 F (36.8 C), resp. rate 14, weight 107 lb 11.1 oz (48.9 kg), SpO2 100 %.   Physical Exam Vitals and nursing note reviewed.  Constitutional:       General: She is not in acute distress.    Appearance: She is not diaphoretic.     Comments: Patient sitting in wheelchair in no acute distress. She requires assistance onto exam table.  HENT:     Head: Normocephalic and atraumatic.     Comments: Short gray hair.    Mouth/Throat:     Mouth: Mucous membranes are moist.     Pharynx: Oropharynx is clear.  Eyes:     General: No scleral icterus.    Extraocular Movements: Extraocular movements intact.     Conjunctiva/sclera: Conjunctivae normal.     Pupils: Pupils are equal, round, and reactive to light.     Comments: Blue eyes.  Cardiovascular:     Rate and Rhythm: Normal rate and regular rhythm.     Heart sounds: Normal heart sounds. No murmur heard.   Pulmonary:     Effort: Pulmonary effort is normal. No respiratory distress.     Breath sounds: Normal breath sounds. No wheezing or rales.  Chest:     Chest wall: No tenderness.  Breasts:     Right: No axillary adenopathy or supraclavicular adenopathy.     Left: No axillary adenopathy or supraclavicular adenopathy.    Abdominal:     General: Bowel sounds are normal. There is no distension.     Palpations: Abdomen is soft. There is no mass.     Tenderness: There is no abdominal tenderness. There is no guarding or rebound.  Musculoskeletal:        General: No swelling or tenderness. Normal range of motion.     Cervical back: Normal range of motion and neck supple.  Lymphadenopathy:     Head:     Right side of head: No preauricular, posterior auricular or occipital adenopathy.     Left side of head: No preauricular, posterior auricular or occipital adenopathy.     Cervical: No cervical adenopathy.     Upper Body:     Right upper body: No supraclavicular or axillary adenopathy.     Left upper body: No supraclavicular or axillary adenopathy.     Lower Body: No right inguinal adenopathy. No left inguinal adenopathy.  Skin:    General: Skin is warm and dry.  Neurological:      Mental Status: She is alert and oriented to person, place, and time.  Psychiatric:        Behavior: Behavior normal.        Thought Content: Thought content normal.        Judgment: Judgment normal.     Imaging studies: 06/10/2020:  Abdomen and pelvis CT revealed a 5.7 cm cecal  mass, suspicious for colonic neoplasm such as adenocarcinoma. There were associated ileocolic nodal metastases measuring up to 2.0 cm short axis.  06/19/2020:  Abdomen and pelvis CT revealed s/p right hemicolectomy with ileocolic anastomosis. Contrast material was seen tracking through the ileocolic anastomosis. Three loculated fluid collections were identified in the anterior abdomen. Each had a thin rim of subtle enhancement. While postoperative day 4 is on the early side for abscess formation, imaging features were suspicious for superinfection of these collections and evolving abscess formation. There was small to moderate volume free fluid in the pelvis. There was scattered small volume intraperitoneal free air and air tracking in the abdominal wall is not unexpected on postoperative day. Gas in the bladder lumen suggested recent instrumentation. Colon appeared to be fluid-filled to the level of the rectum suggesting diarrhea. 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 There was a 0.9 x 1.0 cm nonobstructing stone in the right renal pelvis. There was a 6.5 cm simple cyst interpolar left kidney. There was aortic atherosclerosis. 06/24/2020:  Abdomen and pelvis CT revealed s/p right hemicolectomy with evidence of persistent anastomotic leak with multiple intra-abdominal abscesses and 2 new pigtail drainage catheters in place. There was resolving subcutaneous emphysema in the right chest wall, anterior abdominal wall, and right upper thigh. There was decreasing postoperative pneumoperitoneum. There was persistent gas in the lumen of the urinary bladder over the past 5 days, relatively unchanged. This  would be unusual, and could be iatrogenic if there has been recent catheterization. In the absence of recent catheterization, correlation with urinalysis was recommended to exclude the possibility of urinary tract infection  with gas-forming organisms. There was aortic atherosclerosis. There were additional incidental findings, similar to recent prior study, noted in report.   No visits with results within 3 Day(s) from this visit.  Latest known visit with results is:  Admission on 06/15/2020, Discharged on 06/26/2020  Component Date Value Ref Range Status  . ABO/RH(D) 06/15/2020    Final                   Value:O POS Performed at [facility], [address], [place], [place] [address]   . SURGICAL PATHOLOGY 06/15/2020    Final-Edited                   Value:SURGICAL PATHOLOGY THIS IS AN ADDENDUM REPORT CASE: [id number] PATIENT: Phyllis Ross Surgical Pathology Report Addendum  Reason for Addendum #1:  DNA Mismatch Repair IHC Results  Specimen Submitted: A. Colon, right  Clinical History: Right colon mass  DIAGNOSIS: A. COLON, RIGHT; HEMICOLECTOMY: - MODERATELY DIFFERENTIATED ADENOCARCINOMA, INVASIVE THROUGH MUSCULARIS PROPRIA INTO PERICOLORECTAL TISSUE. - SEVEN OF TWENTY-SEVEN LYMPH NODES INVOLVED BY METASTATIC ADENOCARCINOMA (7/27). - SEE CANCER SUMMARY.  CANCER CASE SUMMARY: COLON AND RECTUM Standard(s): AJCC-UICC 8  SPECIMEN Procedure: Right hemicolectomy  TUMOR Tumor Site: Cecum Histologic Type: Adenocarcinoma Histologic Grade: G2, moderately differentiated Tumor Size: Greatest dimension in Centimeters: 5.2 x 4.3 x 2.7 cm Tumor Extent: Invades through the muscularis propria into pericolorectal tissue Macroscopic Tumor Perforation: Not identified Lymph-Vascular                          Invasion: Present Perineural Invasion: Not identified Treatment Effect: No known presurgical therapy  MARGINS Margin Status for Invasive Carcinoma: All margins  negative for invasive carcinoma Margin Status for Non-Invasive Tumor: All margins negative for high-grade dysplasia/intramucosal carcinoma and low-grade dysplasia  REGIONAL  LYMPH NODES Regional Lymph Nodes: Regional lymph nodes present      Tumor present in regional lymph node(s)           Number of Lymph Nodes with Tumor: 7      Number of Lymph Nodes Examined: 27  Tumor Deposits: Not identified  PATHOLOGIC STAGE CLASSIFICATION (pTNM, AJCC 8th Edition): TNM Descriptors: Not applicable pT3 QM5H pM - Not applicable  Results of immunohistochemistry for MMR (mismatch repair proteins) will be resulted in an addendum.  GROSS DESCRIPTION: A. Labeled: Right colon Received: Formalin Collection time: 5:19 PM on 06/15/2020 Placed into formalin time: 5:23 PM on 06/15/2020 Type of procedure: Right colectomy Me                         asurements: Ileum: 55.5 cm long by 3.1 cm in circumference; ascending colon: 6.1 cm long by 6.6 cm in circumference Specimen integrity: The specimen has a 1.5 x 0.7 cm transmural defect adjacent to the proximal resection margin. Orientation: Unoriented; orientation is presumed based on the anatomical landmarks present. Number of mass(es): 1 Location of mass(es): Cecum Size of mass(es): 5.2 x 4.3 x 2.7 cm Description of mass(es): The mass is tan, firm, and lobulated.  The mass is focally compressing the adjacent small bowel and ileocecal valve but does not grossly appear to invade into either structure.  Additionally, the mass appears to be potentially invading into the adjacent puckered serosa (inked blue).  A distinct portion of vascular pedicle is not marked; however, the closest portion of vessels (the presumed vascular pedicle) is 6 cm from the mass. Bowel circumference at site of mass(es): 7.1 cm Gross depth of invasion: The mass appears to be invading up to 0.5 c                         m into the adjacent mesentery. Macroscopic perforation: No  distinct macroscopic perforation is identified. Margins:                      Proximal: 51.5 cm                      Distal: 4.2 cm                      Mesenteric: 5.2 cm Luminal obstruction: The mass is obstructing approximately 60% of the cecum. Other remarkable findings: A distinct portion of appendix is not grossly appreciated.  The ileum has a tan-pink smooth serosa with scattered areas of fine adhesions.  The ileal mucosa is tan, folded, and grossly unremarkable.  The remaining colon has a tan-pink, smooth, and glistening serosa.  The remaining mucosa is tan and folded.  The ileum has a wall thickness of 0.3 cm and the ascending colon has a thickness of 0.5 cm. Lymph nodes: 28 lymph node candidates are identified ranging from 0.2 to 2.9 cm in greatest dimension.  The 4 largest lymph node candidates have tan-white granular cut surfaces, consistent with potential tumor. Block summary: 1 - proximal rese                         ction margin, en face 2 - 3 - distal resection margin, en face, bisected 4 - mesenteric margin closest to mass, en face 5 - presumed vascular pedicle margin, en face 6 - 11 - representative sections of  mass (1/cm)      6 - deepest invasion into mesentery      7 - mass to puckered serosa      8 - mass abutting terminal ileum      9 - mass abutting ileocecal valve      10 - mass to adjacent grossly normal mucosa 12 - representative transmural defect 13 - representative ileum with serosal adhesions 14 - 18 - lymph node candidates, submitted entirely      14 - 17 - 5 lymph node candidates, in each ((20 total)      18 - 4 lymph node candidate 19 - representative sections from 2 lymph node candidates with potential tumor (1/cm) 20 - 21 - representative sections from 1 lymph node candidate with potential tumor (1/cm) 22 - 23 - representative sections from 1 lymph node candidate with potential tumor (1/cm)  RB 06/17/2020   Final Diagnosis performed by  Allena Napoleon, MD.                            Electronically signed 06/18/2020 2:08:01PM The electronic signature indicates that the named Attending Pathologist has evaluated the specimen Technical component performed at [facility], [address], [place], [place] [address] Lab: [phone] Dir: Rush Farmer, MD, MMM  Professional component performed at [facility], [facility], [place], [place], [place] [address] Lab: [phone] Dir: Dellia Nims. Reuel Derby, MD  ADDENDUM: Immunohistochemistry (IHC) Testing for DNA Mismatch Repair (MMR) Proteins: Results: MLH1: Intact nuclear expression MSH2: Intact nuclear expression MSH6: Intact nuclear expression PMS2: Intact nuclear expression IHC Interpretation: No loss of nuclear expression of MMR proteins: Low probability of MSI-H.  Intact expression of all 4 proteins indicates that MMR enzymes tested are intact but does not entirely exclude Lynch syndrome, as approximately 5% of families have a missense mutation (especially in [place]) that can lead to a                          nonfunctional protein with retained antigenicity. If there is strong clinical suspicion for hereditary cancer, then additional genetic testing can be considered.  Testing was performed on block: A7 IHC slides were prepared by [facility], [place], [place], and interpreted by Dr. Allena Napoleon. All controls stained appropriately.  This test was developed and its performance characteristics determined by [facility]. It has not been cleared or approved by the [place] Food and Drug Administration. The FDA does not require this test to go through premarket FDA review. This test is used for clinical purposes. It should not be regarded as investigational or for research. This laboratory is certified under the Clinical Laboratory Improvement Amendments (CLIA) as qualified to perform high complexity clinical laboratory  testing.    Addendum #1 performed by Allena Napoleon, MD.   Electronically signed 06/22/2020 2:43:01PM The electronic signature i                         ndicates that the named Attending Pathologist has evaluated the specimen Technical component performed at [facility], [address] [place], [place] [address] Lab: [phone] Dir: Rush Farmer, MD, MMM  Professional component performed at [facility], [facility], [place], [place],  [address] Lab: [phone] Dir: Dellia Nims. Rubinas, MD   . WBC 06/16/2020 15.4* 4.0 - 10.5 K/uL Final  . RBC 06/16/2020 4.87  3.87 - 5.11 MIL/uL  Final  . Hemoglobin 06/16/2020 9.9* 12.0 - 15.0 g/dL Final  . HCT 06/16/2020 31.8* 36.0 - 46.0 % Final  . MCV 06/16/2020 65.3* 80.0 - 100.0 fL Final  . MCH 06/16/2020 20.3* 26.0 - 34.0 pg Final  . MCHC 06/16/2020 31.1  30.0 - 36.0 g/dL Final  . RDW 06/16/2020 15.8* 11.5 - 15.5 % Final  . Platelets 06/16/2020 264  150 - 400 K/uL Final  . nRBC 06/16/2020 0.0  0.0 - 0.2 % Final   Performed at [facility], [address] [place], [place] [address]  . Sodium 06/16/2020 134* 135 - 145 mmol/L Final  . Potassium 06/16/2020 3.8  3.5 - 5.1 mmol/L Final  . Chloride 06/16/2020 104  98 - 111 mmol/L Final  . CO2 06/16/2020 23  22 - 32 mmol/L Final  . Glucose, Bld 06/16/2020 138* 70 - 99 mg/dL Final   Glucose reference range applies only to samples taken after fasting for at least 8 hours.  . BUN 06/16/2020 16  8 - 23 mg/dL Final  . Creatinine, Ser 06/16/2020 0.88  0.44 - 1.00 mg/dL Final  . Calcium 06/16/2020 8.6* 8.9 - 10.3 mg/dL Final  . GFR, Estimated 06/16/2020 >60  >60 mL/min Final   Comment: (NOTE) Calculated using the CKD-EPI Creatinine Equation (2021)   . Anion gap 06/16/2020 7  5 - 15 Final   Performed at [facility], [address] [place], [place] [address]  . WBC 06/17/2020 13.2* 4.0 - 10.5 K/uL Final  . RBC 06/17/2020 4.43  3.87 - 5.11 MIL/uL Final  .  Hemoglobin 06/17/2020 9.3* 12.0 - 15.0 g/dL Final  . HCT 06/17/2020 29.0* 36.0 - 46.0 % Final  . MCV 06/17/2020 65.5* 80.0 - 100.0 fL Final  . MCH 06/17/2020 21.0* 26.0 - 34.0 pg Final  . MCHC 06/17/2020 32.1  30.0 - 36.0 g/dL Final  . RDW 06/17/2020 15.9* 11.5 - 15.5 % Final  . Platelets 06/17/2020 224  150 - 400 K/uL Final  . nRBC 06/17/2020 0.0  0.0 - 0.2 % Final   Performed at [facility], [address] [place], [place] [address]  . Sodium 06/17/2020 140  135 - 145 mmol/L Final  . Potassium 06/17/2020 3.6  3.5 - 5.1 mmol/L Final  . Chloride 06/17/2020 107  98 - 111 mmol/L Final  . CO2 06/17/2020 28  22 - 32 mmol/L Final  . Glucose, Bld 06/17/2020 92  70 - 99 mg/dL Final   Glucose reference range applies only to samples taken after fasting for at least 8 hours.  . BUN 06/17/2020 10  8 - 23 mg/dL Final  . Creatinine, Ser 06/17/2020 0.67  0.44 - 1.00 mg/dL Final  . Calcium 06/17/2020 8.6* 8.9 - 10.3 mg/dL Final  . GFR, Estimated 06/17/2020 >60  >60 mL/min Final   Comment: (NOTE) Calculated using the CKD-EPI Creatinine Equation (2021)   . Anion gap 06/17/2020 5  5 - 15 Final   Performed at [facility], [address] [place], [place] [address]  . WBC 06/18/2020 16.7* 4.0 - 10.5 K/uL Final  . RBC 06/18/2020 4.92  3.87 - 5.11 MIL/uL Final  . Hemoglobin 06/18/2020 10.2* 12.0 - 15.0 g/dL Final  . HCT 06/18/2020 32.3* 36.0 - 46.0 % Final  . MCV 06/18/2020 65.7* 80.0 - 100.0 fL Final  . MCH 06/18/2020 20.7* 26.0 - 34.0 pg Final  . MCHC 06/18/2020 31.6  30.0 - 36.0 g/dL Final  . RDW 06/18/2020 15.9* 11.5 -  15.5 % Final  . Platelets 06/18/2020 243  150 - 400 K/uL Final  . nRBC 06/18/2020 0.0  0.0 - 0.2 % Final   Performed at [facility], [address] [place], [place] [address]  . Sodium 06/18/2020 137  135 - 145 mmol/L Final  . Potassium 06/18/2020 3.6  3.5 - 5.1 mmol/L Final  . Chloride 06/18/2020 103  98 - 111 mmol/L Final  . CO2 06/18/2020 27   22 - 32 mmol/L Final  . Glucose, Bld 06/18/2020 93  70 - 99 mg/dL Final   Glucose reference range applies only to samples taken after fasting for at least 8 hours.  . BUN 06/18/2020 12  8 - 23 mg/dL Final  . Creatinine, Ser 06/18/2020 0.75  0.44 - 1.00 mg/dL Final  . Calcium 06/18/2020 8.2* 8.9 - 10.3 mg/dL Final  . GFR, Estimated 06/18/2020 >60  >60 mL/min Final   Comment: (NOTE) Calculated using the CKD-EPI Creatinine Equation (2021)   . Anion gap 06/18/2020 7  5 - 15 Final   Performed at [facility], [address] [place], [place] [address]  . WBC 06/19/2020 18.1* 4.0 - 10.5 K/uL Final  . RBC 06/19/2020 4.79  3.87 - 5.11 MIL/uL Final  . Hemoglobin 06/19/2020 9.8* 12.0 - 15.0 g/dL Final  . HCT 06/19/2020 31.2* 36.0 - 46.0 % Final  . MCV 06/19/2020 65.1* 80.0 - 100.0 fL Final  . MCH 06/19/2020 20.5* 26.0 - 34.0 pg Final  . MCHC 06/19/2020 31.4  30.0 - 36.0 g/dL Final  . RDW 06/19/2020 15.9* 11.5 - 15.5 % Final  . Platelets 06/19/2020 262  150 - 400 K/uL Final  . nRBC 06/19/2020 0.0  0.0 - 0.2 % Final   Performed at [facility], [address], [place], [place] [address]  . Sodium 06/19/2020 137  135 - 145 mmol/L Final  . Potassium 06/19/2020 3.3* 3.5 - 5.1 mmol/L Final  . Chloride 06/19/2020 102  98 - 111 mmol/L Final  . CO2 06/19/2020 28  22 - 32 mmol/L Final  . Glucose, Bld 06/19/2020 113* 70 - 99 mg/dL Final   Glucose reference range applies only to samples taken after fasting for at least 8 hours.  . BUN 06/19/2020 15  8 - 23 mg/dL Final  . Creatinine, Ser 06/19/2020 0.62  0.44 - 1.00 mg/dL Final  . Calcium 06/19/2020 8.3* 8.9 - 10.3 mg/dL Final  . Total Protein 06/19/2020 5.7* 6.5 - 8.1 g/dL Final  . Albumin 06/19/2020 2.7* 3.5 - 5.0 g/dL Final  . AST 06/19/2020 18  15 - 41 U/L Final  . ALT 06/19/2020 10  0 - 44 U/L Final  . Alkaline Phosphatase 06/19/2020 62  38 - 126 U/L Final  . Total Bilirubin 06/19/2020 1.7* 0.3 - 1.2 mg/dL Final  . GFR,  Estimated 06/19/2020 >60  >60 mL/min Final   Comment: (NOTE) Calculated using the CKD-EPI Creatinine Equation (2021)   . Anion gap 06/19/2020 7  5 - 15 Final   Performed at [facility], [address] [place], [place] [address]  . Specimen Description 06/19/2020    Final                   Value:ABSCESS Performed at [facility], [address] [place], [place] [address]   . Special Requests 06/19/2020    Final                   Value:NONE Performed at  [facility], [address], [place], [place] [address]   . Gram Stain 06/19/2020    Final                   Value:ABUNDANT WBC PRESENT, PREDOMINANTLY PMN RARE YEAST   . Culture 06/19/2020    Final                   Value:MODERATE MULTIPLE ORGANISMS PRESENT, NONE PREDOMINANT NO GROUP A STREP (S.PYOGENES) ISOLATED NO STAPHYLOCOCCUS AUREUS ISOLATED NO ANAEROBES ISOLATED; CULTURE IN PROGRESS FOR 5 DAYS NO GRAM NEGATIVE RODS ISOLATES NO ANAEROBES ISOLATED Performed at [facility], [place] [address], [place], [place] [address]   . Report Status 06/19/2020 06/25/2020 FINAL   Final  . WBC 06/20/2020 15.5* 4.0 - 10.5 K/uL Final  . RBC 06/20/2020 4.46  3.87 - 5.11 MIL/uL Final  . Hemoglobin 06/20/2020 9.3* 12.0 - 15.0 g/dL Final  . HCT 06/20/2020 28.8* 36.0 - 46.0 % Final  . MCV 06/20/2020 64.6* 80.0 - 100.0 fL Final  . MCH 06/20/2020 20.9* 26.0 - 34.0 pg Final  . MCHC 06/20/2020 32.3  30.0 - 36.0 g/dL Final  . RDW 06/20/2020 15.6* 11.5 - 15.5 % Final  . Platelets 06/20/2020 270  150 - 400 K/uL Final  . nRBC 06/20/2020 0.0  0.0 - 0.2 % Final   Performed at [facility], [address], [place], [place] [address]  . Sodium 06/20/2020 136  135 - 145 mmol/L Final  . Potassium 06/20/2020 3.6  3.5 - 5.1 mmol/L Final  . Chloride 06/20/2020 102  98 - 111 mmol/L Final  . CO2 06/20/2020 25  22 - 32 mmol/L Final  . Glucose, Bld 06/20/2020 89  70 - 99 mg/dL Final   Glucose reference range applies  only to samples taken after fasting for at least 8 hours.  . BUN 06/20/2020 13  8 - 23 mg/dL Final  . Creatinine, Ser 06/20/2020 0.62  0.44 - 1.00 mg/dL Final  . Calcium 06/20/2020 8.0* 8.9 - 10.3 mg/dL Final  . GFR, Estimated 06/20/2020 >60  >60 mL/min Final   Comment: (NOTE) Calculated using the CKD-EPI Creatinine Equation (2021)   . Anion gap 06/20/2020 9  5 - 15 Final   Performed at [facility], [address] [place], [place] [address]  . Glucose-Capillary 06/20/2020 85  70 - 99 mg/dL Final   Glucose reference range applies only to samples taken after fasting for at least 8 hours.  . Comment 1 06/20/2020 Notify RN   Final  . Comment 2 06/20/2020 Document in Chart   Final  . WBC 06/22/2020 13.3* 4.0 - 10.5 K/uL Final  . RBC 06/22/2020 4.51  3.87 - 5.11 MIL/uL Final  . Hemoglobin 06/22/2020 9.3* 12.0 - 15.0 g/dL Final  . HCT 06/22/2020 29.1* 36.0 - 46.0 % Final  . MCV 06/22/2020 64.5* 80.0 - 100.0 fL Final  . MCH 06/22/2020 20.6* 26.0 - 34.0 pg Final  . MCHC 06/22/2020 32.0  30.0 - 36.0 g/dL Final  . RDW 06/22/2020 15.0  11.5 - 15.5 % Final  . Platelets 06/22/2020 345  150 - 400 K/uL Final  . nRBC 06/22/2020 0.0  0.0 - 0.2 % Final   Performed at [facility], [address], [place], [place] [address]  . Sodium 06/22/2020 130* 135 - 145 mmol/L Final  . Potassium 06/22/2020 3.9  3.5 - 5.1 mmol/L Final  . Chloride 06/22/2020 96* 98 - 111 mmol/L Final  .  CO2 06/22/2020 24  22 - 32 mmol/L Final  . Glucose, Bld 06/22/2020 78  70 - 99 mg/dL Final   Glucose reference range applies only to samples taken after fasting for at least 8 hours.  . BUN 06/22/2020 9  8 - 23 mg/dL Final  . Creatinine, Ser 06/22/2020 0.63  0.44 - 1.00 mg/dL Final  . Calcium 06/22/2020 8.3* 8.9 - 10.3 mg/dL Final  . Total Protein 06/22/2020 5.6* 6.5 - 8.1 g/dL Final  . Albumin 06/22/2020 2.5* 3.5 - 5.0 g/dL Final  . AST 06/22/2020 15  15 - 41 U/L Final  . ALT 06/22/2020 10  0 - 44 U/L Final   . Alkaline Phosphatase 06/22/2020 62  38 - 126 U/L Final  . Total Bilirubin 06/22/2020 1.6* 0.3 - 1.2 mg/dL Final  . GFR, Estimated 06/22/2020 >60  >60 mL/min Final   Comment: (NOTE) Calculated using the CKD-EPI Creatinine Equation (2021)   . Anion gap 06/22/2020 10  5 - 15 Final   Performed at [facility], [address] [place], [place] [address]  . Prealbumin 06/22/2020 6.1* 18 - 38 mg/dL Final   Performed at [facility], [place] [address], [place], [place] [address]  . Glucose-Capillary 06/22/2020 81  70 - 99 mg/dL Final   Glucose reference range applies only to samples taken after fasting for at least 8 hours.  . Glucose-Capillary 06/22/2020 158* 70 - 99 mg/dL Final   Glucose reference range applies only to samples taken after fasting for at least 8 hours.  . WBC 06/23/2020 13.9* 4.0 - 10.5 K/uL Final  . RBC 06/23/2020 4.62  3.87 - 5.11 MIL/uL Final  . Hemoglobin 06/23/2020 9.4* 12.0 - 15.0 g/dL Final  . HCT 06/23/2020 29.2* 36.0 - 46.0 % Final  . MCV 06/23/2020 63.2* 80.0 - 100.0 fL Final  . MCH 06/23/2020 20.3* 26.0 - 34.0 pg Final  . MCHC 06/23/2020 32.2  30.0 - 36.0 g/dL Final  . RDW 06/23/2020 15.0  11.5 - 15.5 % Final  . Platelets 06/23/2020 373  150 - 400 K/uL Final  . nRBC 06/23/2020 0.0  0.0 - 0.2 % Final   Performed at [facility], [address], [place], [place] [address]  . Glucose-Capillary 06/23/2020 140* 70 - 99 mg/dL Final   Glucose reference range applies only to samples taken after fasting for at least 8 hours.  . WBC 06/24/2020 14.9* 4.0 - 10.5 K/uL Final  . RBC 06/24/2020 4.23  3.87 - 5.11 MIL/uL Final  . Hemoglobin 06/24/2020 8.9* 12.0 - 15.0 g/dL Final   Comment: Reticulocyte Hemoglobin testing may be clinically indicated, consider ordering this additional test [id number]   . HCT 06/24/2020 27.5* 36.0 - 46.0 % Final  . MCV 06/24/2020 65.0* 80.0 - 100.0 fL Final  . MCH 06/24/2020 21.0* 26.0 - 34.0 pg Final  . MCHC 06/24/2020  32.4  30.0 - 36.0 g/dL Final  . RDW 06/24/2020 15.3  11.5 - 15.5 % Final  . Platelets 06/24/2020 418* 150 - 400 K/uL Final  . nRBC 06/24/2020 0.0  0.0 - 0.2 % Final   Performed at [facility], [address] [place], [place] [address]  . WBC 06/25/2020 15.4* 4.0 - 10.5 K/uL Final  . RBC 06/25/2020 4.34  3.87 - 5.11 MIL/uL Final  . Hemoglobin 06/25/2020 9.0* 12.0 - 15.0 g/dL Final  . HCT 06/25/2020 27.7* 36.0 - 46.0 % Final  . MCV 06/25/2020 63.8* 80.0 - 100.0 fL Final  .  MCH 06/25/2020 20.7* 26.0 - 34.0 pg Final  . MCHC 06/25/2020 32.5  30.0 - 36.0 g/dL Final  . RDW 06/25/2020 15.0  11.5 - 15.5 % Final  . Platelets 06/25/2020 451* 150 - 400 K/uL Final  . nRBC 06/25/2020 0.0  0.0 - 0.2 % Final   Performed at [facility], [address] [place], [place] [address]  . Sodium 06/25/2020 134* 135 - 145 mmol/L Final  . Potassium 06/25/2020 4.5  3.5 - 5.1 mmol/L Final  . Chloride 06/25/2020 102  98 - 111 mmol/L Final  . CO2 06/25/2020 25  22 - 32 mmol/L Final  . Glucose, Bld 06/25/2020 101* 70 - 99 mg/dL Final   Glucose reference range applies only to samples taken after fasting for at least 8 hours.  . BUN 06/25/2020 9  8 - 23 mg/dL Final  . Creatinine, Ser 06/25/2020 0.68  0.44 - 1.00 mg/dL Final  . Calcium 06/25/2020 8.4* 8.9 - 10.3 mg/dL Final  . GFR, Estimated 06/25/2020 >60  >60 mL/min Final   Comment: (NOTE) Calculated using the CKD-EPI Creatinine Equation (2021)   . Anion gap 06/25/2020 7  5 - 15 Final   Performed at [facility], [address] [place], [place] [address]  . Phosphorus 06/25/2020 3.1  2.5 - 4.6 mg/dL Final   Performed at [facility], [address] [place], [place] [address]  . Magnesium 06/25/2020 1.6* 1.7 - 2.4 mg/dL Final   Performed at [facility], [address] [place], [place] [address]  . Glucose-Capillary 06/26/2020 94  70 - 99 mg/dL Final   Glucose reference range applies only to samples taken  after fasting for at least 8 hours.    Assessment:  Phyllis Ross is a 85 y.o. 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  Pathology revealed a 5.2 x 4.3 x 2.7 cm grade II moderately differentiated adenocarcinoma, invasive through muscularis propria into pericolorectal tissue. Seven of 27 lymph nodes were positive. There was lymph-vascular invasion, but no perineural invasion.  Margins were negative.  MMR was negative (low probability of MSI-H).  Pathologic stage was [id number].  CEA was 524.0 on 06/09/2020.  Colonoscopy on 06/04/2020 revealed a partially obstructing tumor in the cecum. There were non-bleeding internal hemorrhoids. EGD on 06/04/2020 revealed gastritis with hemorrhage. Pathology showed chronic active gastritis with surface erosion and features of healing mucosal injury.  Abdomen and pelvis CT on 06/10/2020 revealed a 5.7 cm cecal mass. There were associated ileocolic nodal metastases measuring up to 2.0 cm short axis.   Right hemicolectomy was complicated by anastomotic breakdown/leak vs abscess.  Drains were placed (later removed 07/02/2020). She was treated with Zosyn then discharged on Augmentin.   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 She completed 6 cycles of paclitaxel and carboplatin in 04/2004. She was last seen by Dr Theora Gianotti on 11/06/2019. Exam was negative.  She has a follow-up in 1 year.    CA 125 has been followed (0-38.1): 26 on 12/22/2009, 25.2 on 03/22/2011, 24.8 on 03/20/2012, 28.5 on 06/22/2012, 22.8 on 05/20/2015, 20.5 on 05/25/2016, 25 on 05/15/2017, 26.7 on 11/07/2018, and 35.4 on 11/06/2019.  She has beta-thalassemia (no records available).  Symptomatically, she has felt stronger since hospital discharge.  She spends the majority of her day sitting or lying down.  She requires assistance with her ADLs.  She patient has 3 rotating caregivers that  are  with her 24/7.   Plan: 1.   Labs today: CBC with diff, CMP, CEA, ferritin, iron studies, B12, folate, retic. 2.   Stage IIIC adenocarcinoma of the cecum  She is s/p right hemicolectomy on 06/15/2020.   Pathologic stage was T3N2bMx.   Preop CEA was 524.  Course complicated by anastomotic breakdown/leak vs abscess.  Discuss chest CT to complete staging.  Discuss benefit of adjuvant chemotherapy (FOLFOX vs Xeloda).   Given patient's age and performance status, discuss consideration of Xeloda.   Information provided.  Potential side effects reviewed.  Multiple questions asked and answered. 3.   Weight loss  Discuss importance of caloric intake.  RN:  Ensure or Boost samples.  Nutrition consult. 4.   Microcytic anemia  She has microcytosis at baseline secondary to beta-thalassemia.  Prior hemoglobin ranged from 9.4 - 11.9 in 2016 and 2021.  Anemia work-up today to r/o iron deficiency and vitamin deficiency given poor oral intake. 5.   Chest CT 07/28/2020. 6.   Nutrition consult. 7.   RTC after chest CT for MD assessment and discussion regarding direction of therapy.   Addendum:  B12 was low (164).  Begin oral B12 1000 mcg a day.  Recheck level in 1 month.  Consider B12 injections if follow-up B12 < 400.  I discussed the assessment and treatment plan with the patient.  The patient was provided an opportunity to ask questions and all were answered.  The patient agreed with the plan and demonstrated an understanding of the instructions.  The patient was advised to call back if the symptoms worsen or if the condition fails to improve as anticipated.  I provided 54 minutes of face-to-face time during this this encounter and > 50% was spent counseling as documented under my assessment and plan.  An additional 15 minutes were spent reviewing her chart (Epic and Care Everywhere) including notes, labs, and imaging studies.    Feliz Herard C. Mike Gip, MD, PhD    07/23/2020, 3:52 PM  I, Mirian Mo  Tufford, am acting as Education administrator for [employer]. Mike Gip, MD, PhD.  I, Evee Liska C. Mike Gip, MD, have reviewed the above documentation for accuracy and completeness, and I agree with the above.

## 2020-07-23 ENCOUNTER — Encounter: Payer: Self-pay | Admitting: Hematology and Oncology

## 2020-07-23 ENCOUNTER — Inpatient Hospital Stay: Payer: Medicare PPO | Attending: Oncology | Admitting: Hematology and Oncology

## 2020-07-23 ENCOUNTER — Inpatient Hospital Stay: Payer: Medicare PPO

## 2020-07-23 ENCOUNTER — Other Ambulatory Visit: Payer: Self-pay | Admitting: Hematology and Oncology

## 2020-07-23 ENCOUNTER — Ambulatory Visit: Payer: Medicare PPO | Admitting: Oncology

## 2020-07-23 ENCOUNTER — Other Ambulatory Visit: Payer: Self-pay

## 2020-07-23 VITALS — BP 106/74 | HR 65 | Temp 98.2°F | Resp 14 | Wt 107.7 lb

## 2020-07-23 DIAGNOSIS — C180 Malignant neoplasm of cecum: Secondary | ICD-10-CM | POA: Diagnosis not present

## 2020-07-23 DIAGNOSIS — R634 Abnormal weight loss: Secondary | ICD-10-CM | POA: Diagnosis not present

## 2020-07-23 DIAGNOSIS — D509 Iron deficiency anemia, unspecified: Secondary | ICD-10-CM | POA: Diagnosis not present

## 2020-07-23 DIAGNOSIS — E43 Unspecified severe protein-calorie malnutrition: Secondary | ICD-10-CM

## 2020-07-23 LAB — CBC WITH DIFFERENTIAL/PLATELET
Abs Immature Granulocytes: 0.06 10*3/uL (ref 0.00–0.07)
Basophils Absolute: 0 10*3/uL (ref 0.0–0.1)
Basophils Relative: 0 %
Eosinophils Absolute: 0.1 10*3/uL (ref 0.0–0.5)
Eosinophils Relative: 1 %
HCT: 29.9 % — ABNORMAL LOW (ref 36.0–46.0)
Hemoglobin: 9.2 g/dL — ABNORMAL LOW (ref 12.0–15.0)
Immature Granulocytes: 1 %
Lymphocytes Relative: 18 %
Lymphs Abs: 1.6 10*3/uL (ref 0.7–4.0)
MCH: 20.1 pg — ABNORMAL LOW (ref 26.0–34.0)
MCHC: 30.8 g/dL (ref 30.0–36.0)
MCV: 65.4 fL — ABNORMAL LOW (ref 80.0–100.0)
Monocytes Absolute: 0.5 10*3/uL (ref 0.1–1.0)
Monocytes Relative: 6 %
Neutro Abs: 6.9 10*3/uL (ref 1.7–7.7)
Neutrophils Relative %: 74 %
Platelets: 395 10*3/uL (ref 150–400)
RBC: 4.57 MIL/uL (ref 3.87–5.11)
RDW: 17 % — ABNORMAL HIGH (ref 11.5–15.5)
WBC: 9.2 10*3/uL (ref 4.0–10.5)
nRBC: 0 % (ref 0.0–0.2)

## 2020-07-23 LAB — COMPREHENSIVE METABOLIC PANEL
ALT: 12 U/L (ref 0–44)
AST: 19 U/L (ref 15–41)
Albumin: 3 g/dL — ABNORMAL LOW (ref 3.5–5.0)
Alkaline Phosphatase: 59 U/L (ref 38–126)
Anion gap: 8 (ref 5–15)
BUN: 18 mg/dL (ref 8–23)
CO2: 24 mmol/L (ref 22–32)
Calcium: 8.9 mg/dL (ref 8.9–10.3)
Chloride: 105 mmol/L (ref 98–111)
Creatinine, Ser: 1.03 mg/dL — ABNORMAL HIGH (ref 0.44–1.00)
GFR, Estimated: 54 mL/min — ABNORMAL LOW (ref >60)
Glucose, Bld: 118 mg/dL — ABNORMAL HIGH (ref 70–99)
Potassium: 3.4 mmol/L — ABNORMAL LOW (ref 3.5–5.1)
Sodium: 137 mmol/L (ref 135–145)
Total Bilirubin: 0.7 mg/dL (ref 0.3–1.2)
Total Protein: 7.4 g/dL (ref 6.5–8.1)

## 2020-07-23 LAB — RETICULOCYTES
Immature Retic Fract: 19.7 % — ABNORMAL HIGH (ref 2.3–15.9)
RBC.: 4.35 MIL/uL (ref 3.87–5.11)
Retic Count, Absolute: 67.9 10*3/uL (ref 19.0–186.0)
Retic Ct Pct: 1.6 % (ref 0.4–3.1)

## 2020-07-23 LAB — VITAMIN B12: Vitamin B-12: 164 pg/mL — ABNORMAL LOW (ref 180–914)

## 2020-07-23 LAB — IRON AND TIBC
Iron: 33 ug/dL (ref 28–170)
Saturation Ratios: 13 % (ref 10.4–31.8)
TIBC: 265 ug/dL (ref 250–450)
UIBC: 232 ug/dL

## 2020-07-23 LAB — FERRITIN: Ferritin: 90 ng/mL (ref 11–307)

## 2020-07-23 LAB — FOLATE: Folate: 9.3 ng/mL (ref >5.9)

## 2020-07-23 NOTE — Patient Instructions (Addendum)
Capecitabine tablets What is this medicine? CAPECITABINE (ka pe SITE a been) is a chemotherapy drug. It treats certain types of cancer like breast cancer, colon cancer, and rectal cancer. This medicine may be used for other purposes; ask your health care provider or pharmacist if you have questions. COMMON BRAND NAME(S): Xeloda What should I tell my health care provider before I take this medicine? They need to know if you have any of these conditions:  dehydration  have been told that you lack the enzyme DPD (dihydropyrimidine dehydrogenase)  heart disease  kidney disease  liver disease  low blood counts (white cells or platelets)  take medicines that treat or prevent blood clots  an unusual or allergic reaction to capecitabine, fluorouracil, other medicines, foods, dyes, or preservatives  pregnant or trying to get pregnant  breast-feeding How should I use this medicine? Take this medicine by mouth with water. Take it as directed on the prescription label. Do not cut, crush or chew this medicine. Swallow the tablets whole. Take it with food within 30 minutes after finishing a meal. Keep taking it unless your health care provider tells you to stop. This medicine is taken in "cycles". There will be days you do not take it. Talk to your health care provider if you have questions about when to take your medicine. It is very important to follow the exact schedule. Taking it more often than directed can cause serious side effects. Handling this medicine may be harmful. Wear gloves while touching the medicine or bottle. Talk to your health care provider about how to handle this medicine. Special instructions may apply. Talk to your health care provider about the use of this medicine in children. Special care may be needed. Patients over 34 years of age may have a stronger reaction. Overdosage: If you think you have taken too much of this medicine contact a poison control center or  emergency room at once. NOTE: This medicine is only for you. Do not share this medicine with others. What if I miss a dose? If you miss a dose, skip it. Take your next dose at the normal time. Do not take extra or 2 doses at the same time to make up for the missed dose. What may interact with this medicine? This medicine may interact with the following medications:  allopurinol  leucovorin  phenytoin  warfarin This list may not describe all possible interactions. Give your health care provider a list of all the medicines, herbs, non-prescription drugs, or dietary supplements you use. Also tell them if you smoke, drink alcohol, or use illegal drugs. Some items may interact with your medicine. What should I watch for while using this medicine? This medicine may make you feel generally unwell. This is not uncommon as chemotherapy can affect healthy cells as well as cancer cells. Report any side effects. Continue your course of treatment even though you feel ill unless your health care provider tells you to stop. You may need blood work while you are taking this medicine. This medicine may increase your risk of getting an infection. Call your health care provider for advice if you get a fever, chills, sore throat, or other symptoms of a cold or flu. Do not treat yourself. Try to avoid being around people who are sick. This medicine may increase your risk to bruise or bleed. Call your doctor or health care professional if you notice any unusual bleeding. Be careful brushing and flossing your teeth or using a toothpick because you  may get an infection or bleed more easily. If you have any dental work done, tell your dentist you are receiving this medicine. Avoid taking medicines that contain aspirin, acetaminophen, ibuprofen, naproxen, or ketoprofen unless instructed by your health care provider. These medicines may hide a fever. Do not become pregnant while taking this medicine or for 6 months after  stopping it. Women should inform their health care provider if they wish to become pregnant or think they might be pregnant. Men should not father a child while taking this medicine and for 3 months after stopping it. There is potential for serious harm to an unborn child. Talk to your health care provider for more information. Do not breast-feed an infant while taking this medicine or for 2 weeks after stopping it. This medicine may make it more difficult to get pregnant or father a child. Talk to your health care provider if you are concerned about your fertility. Check with your health care provider if you have severe diarrhea, nausea, and vomiting, or if you sweat a lot. The loss of too much body fluid may make it dangerous for you to take this medicine. This medicine may cause serious skin reactions. They can happen weeks to months after starting the medicine. Contact your health care provider right away if you notice fevers or flu-like symptoms with a rash. The rash may be red or purple and then turn into blisters or peeling of the skin. Or, you might notice a red rash with swelling of the face, lips or lymph nodes in your neck or under your arms. What side effects may I notice from receiving this medicine? Side effects that you should report to your doctor or health care professional as soon as possible:  allergic reactions (skin rash, itching or hives; swelling of the face, lips, or tongue)  diarrhea  dizziness  infection (fever, chills, cough, sore throat, pain or trouble passing urine)  low red blood cell counts (trouble breathing; feeling faint; lightheaded, falls; unusually weak or tired)  kidney injury (trouble passing urine or change in the amount of urine)  light-colored stool  liver injury (dark yellow or brown urine; general ill feeling or flu-like symptoms; loss of appetite, right upper belly pain; unusually weak or tired, yellowing of the eyes or skin)  mouth sores  nausea  or vomiting  pain or tightness in the chest, neck, back, or arms  redness, blistering, peeling, bleeding, or swelling of the skin on the palms of your hands or soles of your feet  redness, blistering, peeling, or loosening of the skin, including inside the mouth  unusual bruising or bleeding Side effects that usually do not require medical attention (report to your doctor or health care professional if they continue or are bothersome):  changes in vision  constipation  loss of appetite  mouth sores  pain, tingling, numbness in the hands or feet  stomach pain This list may not describe all possible side effects. Call your doctor for medical advice about side effects. You may report side effects to FDA at 1-800-FDA-1088. Where should I keep my medicine? Keep out of the reach of children and pets. Store at room temperature between 20 and 25 degrees C (68 and 77 degrees F). Keep the container tightly closed. Get rid of any unused medicine after the expiration date. To get rid of medicines that are no longer needed or have expired:  Take the medicine to a medicine take-back program. Check with your pharmacy or law enforcement  to find a location.  If you cannot return the medicine, ask your pharmacist or health care provider how to get rid of this medicine safely. NOTE: This sheet is a summary. It may not cover all possible information. If you have questions about this medicine, talk to your doctor, pharmacist, or health care provider.  2021 [employer] (2019-10-21 10:43:03)   ___________________________________________________________________________________  Fluorouracil, 5-FU injection What is this medicine? FLUOROURACIL, 5-FU (flure oh YOOR a sil) is a chemotherapy drug. It slows the growth of cancer cells. This medicine is used to treat many types of cancer like breast cancer, colon or rectal cancer, pancreatic cancer, and stomach cancer. This medicine may be used  for other purposes; ask your health care provider or pharmacist if you have questions. COMMON BRAND NAME(S): Adrucil What should I tell my health care provider before I take this medicine? They need to know if you have any of these conditions:  blood disorders  dihydropyrimidine dehydrogenase (DPD) deficiency  infection (especially a virus infection such as chickenpox, cold sores, or herpes)  kidney disease  liver disease  malnourished, poor nutrition  recent or ongoing radiation therapy  an unusual or allergic reaction to fluorouracil, other chemotherapy, other medicines, foods, dyes, or preservatives  pregnant or trying to get pregnant  breast-feeding How should I use this medicine? This drug is given as an infusion or injection into a vein. It is administered in a hospital or clinic by a specially trained health care professional. Talk to your pediatrician regarding the use of this medicine in children. Special care may be needed. Overdosage: If you think you have taken too much of this medicine contact a poison control center or emergency room at once. NOTE: This medicine is only for you. Do not share this medicine with others. What if I miss a dose? It is important not to miss your dose. Call your doctor or health care professional if you are unable to keep an appointment. What may interact with this medicine? Do not take this medicine with any of the following medications:  live virus vaccines This medicine may also interact with the following medications:  medicines that treat or prevent blood clots like warfarin, enoxaparin, and dalteparin This list may not describe all possible interactions. Give your health care provider a list of all the medicines, herbs, non-prescription drugs, or dietary supplements you use. Also tell them if you smoke, drink alcohol, or use illegal drugs. Some items may interact with your medicine. What should I watch for while using this  medicine? Visit your doctor for checks on your progress. This drug may make you feel generally unwell. This is not uncommon, as chemotherapy can affect healthy cells as well as cancer cells. Report any side effects. Continue your course of treatment even though you feel ill unless your doctor tells you to stop. In some cases, you may be given additional medicines to help with side effects. Follow all directions for their use. Call your doctor or health care professional for advice if you get a fever, chills or sore throat, or other symptoms of a cold or flu. Do not treat yourself. This drug decreases your body's ability to fight infections. Try to avoid being around people who are sick. This medicine may increase your risk to bruise or bleed. Call your doctor or health care professional if you notice any unusual bleeding. Be careful brushing and flossing your teeth or using a toothpick because you may get an infection or bleed more easily.  If you have any dental work done, tell your dentist you are receiving this medicine. Avoid taking products that contain aspirin, acetaminophen, ibuprofen, naproxen, or ketoprofen unless instructed by your doctor. These medicines may hide a fever. Do not become pregnant while taking this medicine. Women should inform their doctor if they wish to become pregnant or think they might be pregnant. There is a potential for serious side effects to an unborn child. Talk to your health care professional or pharmacist for more information. Do not breast-feed an infant while taking this medicine. Men should inform their doctor if they wish to father a child. This medicine may lower sperm counts. Do not treat diarrhea with over the counter products. Contact your doctor if you have diarrhea that lasts more than 2 days or if it is severe and watery. This medicine can make you more sensitive to the sun. Keep out of the sun. If you cannot avoid being in the sun, wear protective clothing  and use sunscreen. Do not use sun lamps or tanning beds/booths. What side effects may I notice from receiving this medicine? Side effects that you should report to your doctor or health care professional as soon as possible:  allergic reactions like skin rash, itching or hives, swelling of the face, lips, or tongue  low blood counts - this medicine may decrease the number of white blood cells, red blood cells and platelets. You may be at increased risk for infections and bleeding.  signs of infection - fever or chills, cough, sore throat, pain or difficulty passing urine  signs of decreased platelets or bleeding - bruising, pinpoint red spots on the skin, black, tarry stools, blood in the urine  signs of decreased red blood cells - unusually weak or tired, fainting spells, lightheadedness  breathing problems  changes in vision  chest pain  mouth sores  nausea and vomiting  pain, swelling, redness at site where injected  pain, tingling, numbness in the hands or feet  redness, swelling, or sores on hands or feet  stomach pain  unusual bleeding Side effects that usually do not require medical attention (report to your doctor or health care professional if they continue or are bothersome):  changes in finger or toe nails  diarrhea  dry or itchy skin  hair loss  headache  loss of appetite  sensitivity of eyes to the light  stomach upset  unusually teary eyes This list may not describe all possible side effects. Call your doctor for medical advice about side effects. You may report side effects to FDA at 1-800-FDA-1088. Where should I keep my medicine? This drug is given in a hospital or clinic and will not be stored at home. NOTE: This sheet is a summary. It may not cover all possible information. If you have questions about this medicine, talk to your doctor, pharmacist, or health care provider.  2021 [employer] Standard (2019-02-19 15:00:03)   Leucovorin  injection What is this medicine? LEUCOVORIN (loo koe VOR in) is used to prevent or treat the harmful effects of some medicines. This medicine is used to treat anemia caused by a low amount of folic acid in the body. It is also used with 5-fluorouracil (5-FU) to treat colon cancer. This medicine may be used for other purposes; ask your health care provider or pharmacist if you have questions. What should I tell my health care provider before I take this medicine? They need to know if you have any of these conditions:  anemia from low  levels of vitamin B-12 in the blood  an unusual or allergic reaction to leucovorin, folic acid, other medicines, foods, dyes, or preservatives  pregnant or trying to get pregnant  breast-feeding How should I use this medicine? This medicine is for injection into a muscle or into a vein. It is given by a health care professional in a hospital or clinic setting. Talk to your pediatrician regarding the use of this medicine in children. Special care may be needed. Overdosage: If you think you have taken too much of this medicine contact a poison control center or emergency room at once. NOTE: This medicine is only for you. Do not share this medicine with others. What if I miss a dose? This does not apply. What may interact with this medicine?  capecitabine  fluorouracil  phenobarbital  phenytoin  primidone  trimethoprim-sulfamethoxazole This list may not describe all possible interactions. Give your health care provider a list of all the medicines, herbs, non-prescription drugs, or dietary supplements you use. Also tell them if you smoke, drink alcohol, or use illegal drugs. Some items may interact with your medicine. What should I watch for while using this medicine? Your condition will be monitored carefully while you are receiving this medicine. This medicine may increase the side effects of 5-fluorouracil, 5-FU. Tell your doctor or health care  professional if you have diarrhea or mouth sores that do not get better or that get worse. What side effects may I notice from receiving this medicine? Side effects that you should report to your doctor or health care professional as soon as possible:  allergic reactions like skin rash, itching or hives, swelling of the face, lips, or tongue  breathing problems  fever, infection  mouth sores  unusual bleeding or bruising  unusually weak or tired Side effects that usually do not require medical attention (report to your doctor or health care professional if they continue or are bothersome):  constipation or diarrhea  loss of appetite  nausea, vomiting This list may not describe all possible side effects. Call your doctor for medical advice about side effects. You may report side effects to FDA at 1-800-FDA-1088. Where should I keep my medicine? This drug is given in a hospital or clinic and will not be stored at home. NOTE: This sheet is a summary. It may not cover all possible information. If you have questions about this medicine, talk to your doctor, pharmacist, or health care provider.  2021 [employer] Standard (2007-09-25 16:50:29)   Oxaliplatin Injection What is this medicine? OXALIPLATIN (ox AL i PLA tin) is a chemotherapy drug. It targets fast dividing cells, like cancer cells, and causes these cells to die. This medicine is used to treat cancers of the colon and rectum, and many other cancers. This medicine may be used for other purposes; ask your health care provider or pharmacist if you have questions. COMMON BRAND NAME(S): Eloxatin What should I tell my health care provider before I take this medicine? They need to know if you have any of these conditions:  heart disease  history of irregular heartbeat  liver disease  low blood counts, like white cells, platelets, or red blood cells  lung or breathing disease, like asthma  take medicines that treat or  prevent blood clots  tingling of the fingers or toes, or other nerve disorder  an unusual or allergic reaction to oxaliplatin, other chemotherapy, other medicines, foods, dyes, or preservatives  pregnant or trying to get pregnant  breast-feeding How should I  use this medicine? This drug is given as an infusion into a vein. It is administered in a hospital or clinic by a specially trained health care professional. Talk to your pediatrician regarding the use of this medicine in children. Special care may be needed. Overdosage: If you think you have taken too much of this medicine contact a poison control center or emergency room at once. NOTE: This medicine is only for you. Do not share this medicine with others. What if I miss a dose? It is important not to miss a dose. Call your doctor or health care professional if you are unable to keep an appointment. What may interact with this medicine? Do not take this medicine with any of the following medications:  cisapride  dronedarone  pimozide  thioridazine This medicine may also interact with the following medications:  aspirin and aspirin-like medicines  certain medicines that treat or prevent blood clots like warfarin, apixaban, dabigatran, and rivaroxaban  cisplatin  cyclosporine  diuretics  medicines for infection like acyclovir, adefovir, amphotericin B, bacitracin, cidofovir, foscarnet, ganciclovir, gentamicin, pentamidine, vancomycin  NSAIDs, medicines for pain and inflammation, like ibuprofen or naproxen  other medicines that prolong the QT interval (an abnormal heart rhythm)  pamidronate  zoledronic acid This list may not describe all possible interactions. Give your health care provider a list of all the medicines, herbs, non-prescription drugs, or dietary supplements you use. Also tell them if you smoke, drink alcohol, or use illegal drugs. Some items may interact with your medicine. What should I watch for  while using this medicine? Your condition will be monitored carefully while you are receiving this medicine. You may need blood work done while you are taking this medicine. This medicine may make you feel generally unwell. This is not uncommon as chemotherapy can affect healthy cells as well as cancer cells. Report any side effects. Continue your course of treatment even though you feel ill unless your healthcare professional tells you to stop. This medicine can make you more sensitive to cold. Do not drink cold drinks or use ice. Cover exposed skin before coming in contact with cold temperatures or cold objects. When out in cold weather wear warm clothing and cover your mouth and nose to warm the air that goes into your lungs. Tell your doctor if you get sensitive to the cold. Do not become pregnant while taking this medicine or for 9 months after stopping it. Women should inform their health care professional if they wish to become pregnant or think they might be pregnant. Men should not father a child while taking this medicine and for 6 months after stopping it. There is potential for serious side effects to an unborn child. Talk to your health care professional for more information. Do not breast-feed a child while taking this medicine or for 3 months after stopping it. This medicine has caused ovarian failure in some women. This medicine may make it more difficult to get pregnant. Talk to your health care professional if you are concerned about your fertility. This medicine has caused decreased sperm counts in some men. This may make it more difficult to father a child. Talk to your health care professional if you are concerned about your fertility. This medicine may increase your risk of getting an infection. Call your health care professional for advice if you get a fever, chills, or sore throat, or other symptoms of a cold or flu. Do not treat yourself. Try to avoid being around people who are  sick. Avoid taking medicines that contain aspirin, acetaminophen, ibuprofen, naproxen, or ketoprofen unless instructed by your health care professional. These medicines may hide a fever. Be careful brushing or flossing your teeth or using a toothpick because you may get an infection or bleed more easily. If you have any dental work done, tell your dentist you are receiving this medicine. What side effects may I notice from receiving this medicine? Side effects that you should report to your doctor or health care professional as soon as possible:  allergic reactions like skin rash, itching or hives, swelling of the face, lips, or tongue  breathing problems  cough  low blood counts - this medicine may decrease the number of white blood cells, red blood cells, and platelets. You may be at increased risk for infections and bleeding  nausea, vomiting  pain, redness, or irritation at site where injected  pain, tingling, numbness in the hands or feet  signs and symptoms of bleeding such as bloody or black, tarry stools; red or dark brown urine; spitting up blood or brown material that looks like coffee grounds; red spots on the skin; unusual bruising or bleeding from the eyes, gums, or nose  signs and symptoms of a dangerous change in heartbeat or heart rhythm like chest pain; dizziness; fast, irregular heartbeat; palpitations; feeling faint or lightheaded; falls  signs and symptoms of infection like fever; chills; cough; sore throat; pain or trouble passing urine  signs and symptoms of liver injury like dark yellow or brown urine; general ill feeling or flu-like symptoms; light-colored stools; loss of appetite; nausea; right upper belly pain; unusually weak or tired; yellowing of the eyes or skin  signs and symptoms of low red blood cells or anemia such as unusually weak or tired; feeling faint or lightheaded; falls  signs and symptoms of muscle injury like dark urine; trouble passing urine  or change in the amount of urine; unusually weak or tired; muscle pain; back pain Side effects that usually do not require medical attention (report to your doctor or health care professional if they continue or are bothersome):  changes in taste  diarrhea  gas  hair loss  loss of appetite  mouth sores This list may not describe all possible side effects. Call your doctor for medical advice about side effects. You may report side effects to FDA at 1-800-FDA-1088. Where should I keep my medicine? This drug is given in a hospital or clinic and will not be stored at home. NOTE: This sheet is a summary. It may not cover all possible information. If you have questions about this medicine, talk to your doctor, pharmacist, or health care provider.  2021 [employer] (2018-08-08 12:20:35)

## 2020-07-24 LAB — CEA: CEA: 21.9 ng/mL — ABNORMAL HIGH (ref 0.0–4.7)

## 2020-07-27 ENCOUNTER — Other Ambulatory Visit: Payer: Self-pay

## 2020-07-27 ENCOUNTER — Telehealth: Payer: Self-pay | Admitting: *Deleted

## 2020-07-27 ENCOUNTER — Telehealth: Payer: Self-pay

## 2020-07-27 DIAGNOSIS — D509 Iron deficiency anemia, unspecified: Secondary | ICD-10-CM

## 2020-07-27 NOTE — Telephone Encounter (Signed)
[facility]- Seth Bake called to let us know that patient requested a refill on Megestrol 40mg  but its is on back order so they gave her 20mg  tablets and changed the detail to take 2 tablets twice a day.  If you have any questions the number is [phone]

## 2020-07-27 NOTE — Progress Notes (Signed)
B12 is low. Start otc b12 1000 mcg daily. Plan to recheck in 3 months.

## 2020-07-28 ENCOUNTER — Telehealth: Payer: Self-pay | Admitting: Gastroenterology

## 2020-07-28 DIAGNOSIS — L0291 Cutaneous abscess, unspecified: Secondary | ICD-10-CM | POA: Diagnosis not present

## 2020-07-28 DIAGNOSIS — Z4682 Encounter for fitting and adjustment of non-vascular catheter: Secondary | ICD-10-CM | POA: Diagnosis not present

## 2020-07-28 DIAGNOSIS — Z792 Long term (current) use of antibiotics: Secondary | ICD-10-CM | POA: Diagnosis not present

## 2020-07-28 DIAGNOSIS — C180 Malignant neoplasm of cecum: Secondary | ICD-10-CM | POA: Diagnosis not present

## 2020-07-28 DIAGNOSIS — Z9049 Acquired absence of other specified parts of digestive tract: Secondary | ICD-10-CM | POA: Diagnosis not present

## 2020-07-28 DIAGNOSIS — T8141XD Infection following a procedure, superficial incisional surgical site, subsequent encounter: Secondary | ICD-10-CM | POA: Diagnosis not present

## 2020-07-28 DIAGNOSIS — Z483 Aftercare following surgery for neoplasm: Secondary | ICD-10-CM | POA: Diagnosis not present

## 2020-07-28 DIAGNOSIS — Z791 Long term (current) use of non-steroidal anti-inflammatories (NSAID): Secondary | ICD-10-CM | POA: Diagnosis not present

## 2020-07-28 DIAGNOSIS — Z79891 Long term (current) use of opiate analgesic: Secondary | ICD-10-CM | POA: Diagnosis not present

## 2020-07-28 NOTE — Telephone Encounter (Signed)
Spoke with pt's daughter, Colletta [place] regarding the sutabs. She thought either Dr. Ronnald Ramp or Dr. Mike Gip wanted her to stay on the medication. I advised her Nila Nephew is a colonoscopy prep and not intended for daily use. She will discuss with her new oncologist at the next appt.

## 2020-07-28 NOTE — Telephone Encounter (Signed)
Patient's daughter, Phyllis Ross called LVM.  Asks for call back, she has questions about her Mom's prescriptions that Dr Allen Norris prescribed.

## 2020-07-31 ENCOUNTER — Other Ambulatory Visit: Payer: Self-pay

## 2020-07-31 ENCOUNTER — Inpatient Hospital Stay: Payer: Medicare PPO

## 2020-07-31 DIAGNOSIS — Z9049 Acquired absence of other specified parts of digestive tract: Secondary | ICD-10-CM | POA: Diagnosis not present

## 2020-07-31 DIAGNOSIS — L0291 Cutaneous abscess, unspecified: Secondary | ICD-10-CM | POA: Diagnosis not present

## 2020-07-31 DIAGNOSIS — Z79891 Long term (current) use of opiate analgesic: Secondary | ICD-10-CM | POA: Diagnosis not present

## 2020-07-31 DIAGNOSIS — C180 Malignant neoplasm of cecum: Secondary | ICD-10-CM | POA: Diagnosis not present

## 2020-07-31 DIAGNOSIS — Z483 Aftercare following surgery for neoplasm: Secondary | ICD-10-CM | POA: Diagnosis not present

## 2020-07-31 DIAGNOSIS — Z792 Long term (current) use of antibiotics: Secondary | ICD-10-CM | POA: Diagnosis not present

## 2020-07-31 DIAGNOSIS — Z791 Long term (current) use of non-steroidal anti-inflammatories (NSAID): Secondary | ICD-10-CM | POA: Diagnosis not present

## 2020-07-31 DIAGNOSIS — T8141XD Infection following a procedure, superficial incisional surgical site, subsequent encounter: Secondary | ICD-10-CM | POA: Diagnosis not present

## 2020-07-31 DIAGNOSIS — Z4682 Encounter for fitting and adjustment of non-vascular catheter: Secondary | ICD-10-CM | POA: Diagnosis not present

## 2020-07-31 NOTE — Progress Notes (Signed)
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  Past medical history MI, HLD, HTN, ovarian adenocarcinoma.  Patient to meet with medical oncology next week.   Met with patient and daughter in clinic. Patient reports poor appetite, no desire to eat, although in the last week and half has been eating a little bit more.  Taking megace.  Breakfast recently was egg scrambles, ham, slice cantelope, toast with jelly and butter, coffee and juice.  Lunch was roast beef, 3/4 of cooked apples and 1/2 macaroni cheese. Drank ensure for evening meal.  Reports 1 bowel movement daily more solid.  Reports at times that food feels like it is growing in mouth.  Nausea is better.      Nutrition Focused Physical Exam:  Orbital: mild Upper arm: moderate Ribs: moderate  Temple: mild Clavicle: mild/moderate Shoulder: moderate Scapula: mild/moderate Hand: moderate Thigh: moderate Calf: moderate   Medications: Fe sulfate, megace   Labs: reviewed   Anthropometrics:   Height: 65 inches Weight: 107 lb 11.1 oz on 4/21 UBW: 120s 120 lb on 3/8 BMI: 17  11% weight loss in the last month and half, significant   Estimated Energy Needs  Kcals: 1400-1600 Protein: 70-80 g Fluid: 1.4 L   NUTRITION DIAGNOSIS: Inadequate oral intake related to cancer and cancer related treatment side effects (surgery) as evidenced by 11% weight loss, poor po intake and moderate fat and muscle loss   MALNUTRITION DIAGNOSIS: Patient meet criteria for severe malnutrition in context of acute illness as evidenced by 11% weight loss in month and half, moderate fat and muscle loss   INTERVENTION:  Continue appetite stimulant Encouraged taking nausea medications when food feels like growing in mouth or stomach is queasy Discussed ways to add calories and  protein in diet. Handout provided. Snack idea handout provided Samples of high calorie shakes given and encouraged to drink between meals Contact information given   MONITORING, EVALUATION, GOAL: weight trends, intake   Next Visit: Friday,  May 20th phone call  Phyllis Ross, [place], [place] Registered Dietitian [phone] (mobile)

## 2020-08-05 ENCOUNTER — Other Ambulatory Visit: Payer: Self-pay

## 2020-08-05 ENCOUNTER — Ambulatory Visit
Admission: RE | Admit: 2020-08-05 | Discharge: 2020-08-05 | Disposition: A | Discharge status: Home / Self Care | Payer: Medicare PPO | Source: Ambulatory Visit | Attending: Hematology and Oncology | Admitting: Hematology and Oncology

## 2020-08-05 ENCOUNTER — Telehealth: Payer: Self-pay

## 2020-08-05 DIAGNOSIS — Z79891 Long term (current) use of opiate analgesic: Secondary | ICD-10-CM | POA: Diagnosis not present

## 2020-08-05 DIAGNOSIS — Z792 Long term (current) use of antibiotics: Secondary | ICD-10-CM | POA: Diagnosis not present

## 2020-08-05 DIAGNOSIS — I2510 Atherosclerotic heart disease of native coronary artery without angina pectoris: Secondary | ICD-10-CM | POA: Diagnosis not present

## 2020-08-05 DIAGNOSIS — Z9049 Acquired absence of other specified parts of digestive tract: Secondary | ICD-10-CM | POA: Diagnosis not present

## 2020-08-05 DIAGNOSIS — J984 Other disorders of lung: Secondary | ICD-10-CM | POA: Diagnosis not present

## 2020-08-05 DIAGNOSIS — T8141XD Infection following a procedure, superficial incisional surgical site, subsequent encounter: Secondary | ICD-10-CM | POA: Diagnosis not present

## 2020-08-05 DIAGNOSIS — Z4682 Encounter for fitting and adjustment of non-vascular catheter: Secondary | ICD-10-CM | POA: Diagnosis not present

## 2020-08-05 DIAGNOSIS — J929 Pleural plaque without asbestos: Secondary | ICD-10-CM | POA: Diagnosis not present

## 2020-08-05 DIAGNOSIS — Z483 Aftercare following surgery for neoplasm: Secondary | ICD-10-CM | POA: Diagnosis not present

## 2020-08-05 DIAGNOSIS — C180 Malignant neoplasm of cecum: Secondary | ICD-10-CM | POA: Insufficient documentation

## 2020-08-05 DIAGNOSIS — L0291 Cutaneous abscess, unspecified: Secondary | ICD-10-CM | POA: Diagnosis not present

## 2020-08-05 DIAGNOSIS — Z791 Long term (current) use of non-steroidal anti-inflammatories (NSAID): Secondary | ICD-10-CM | POA: Diagnosis not present

## 2020-08-05 DIAGNOSIS — I728 Aneurysm of other specified arteries: Secondary | ICD-10-CM | POA: Diagnosis not present

## 2020-08-05 NOTE — Telephone Encounter (Unsigned)
Copied from [place] [phone]. Topic: General - Other >> Aug 05, 2020  8:27 AM Oneta Rack wrote: Reason for CRM: patient would like to speak with "Baxter Flattery" but did not want to elaborate. Patient states she needs Baxter Flattery expertise regarding "something" , please advise

## 2020-08-05 NOTE — Telephone Encounter (Signed)
Returned call

## 2020-08-06 ENCOUNTER — Telehealth: Payer: Self-pay | Admitting: Pharmacy Technician

## 2020-08-06 ENCOUNTER — Ambulatory Visit: Payer: Medicare PPO | Admitting: Nurse Practitioner

## 2020-08-06 ENCOUNTER — Inpatient Hospital Stay: Payer: Medicare PPO | Attending: Oncology | Admitting: Oncology

## 2020-08-06 ENCOUNTER — Inpatient Hospital Stay: Payer: Medicare PPO | Admitting: Pharmacist

## 2020-08-06 ENCOUNTER — Other Ambulatory Visit (HOSPITAL_COMMUNITY): Payer: Self-pay

## 2020-08-06 VITALS — BP 102/63 | HR 74 | Temp 98.3°F | Wt 106.6 lb

## 2020-08-06 DIAGNOSIS — Z7189 Other specified counseling: Secondary | ICD-10-CM | POA: Diagnosis not present

## 2020-08-06 DIAGNOSIS — E785 Hyperlipidemia, unspecified: Secondary | ICD-10-CM | POA: Diagnosis not present

## 2020-08-06 DIAGNOSIS — C180 Malignant neoplasm of cecum: Secondary | ICD-10-CM | POA: Diagnosis not present

## 2020-08-06 DIAGNOSIS — Z79899 Other long term (current) drug therapy: Secondary | ICD-10-CM | POA: Diagnosis not present

## 2020-08-06 DIAGNOSIS — I252 Old myocardial infarction: Secondary | ICD-10-CM | POA: Diagnosis not present

## 2020-08-06 DIAGNOSIS — I10 Essential (primary) hypertension: Secondary | ICD-10-CM | POA: Diagnosis not present

## 2020-08-06 DIAGNOSIS — Z9049 Acquired absence of other specified parts of digestive tract: Secondary | ICD-10-CM | POA: Diagnosis not present

## 2020-08-06 NOTE — Telephone Encounter (Signed)
Oral Oncology Patient Advocate Encounter  Received notification from [employer] that prior authorization for Xeloda is required.  PA submitted on CoverMyMeds Key [id number] Status is pending  Oral Oncology Clinic will continue to follow.  Phyllis Ross Patient [place] Phone [phone] Fax [phone] 08/10/2020 8:40 AM

## 2020-08-07 ENCOUNTER — Other Ambulatory Visit (HOSPITAL_COMMUNITY): Payer: Self-pay

## 2020-08-07 DIAGNOSIS — Z7189 Other specified counseling: Secondary | ICD-10-CM | POA: Insufficient documentation

## 2020-08-07 MED ORDER — CAPECITABINE 500 MG PO TABS
ORAL_TABLET | ORAL | 0 refills | Status: DC
  Filled 2020-08-07: qty 70, fill #0
  Filled 2020-08-17: qty 70, 21d supply, fill #0

## 2020-08-07 MED ORDER — CAPECITABINE 500 MG PO TABS: ORAL_TABLET | ORAL | Status: DC

## 2020-08-07 NOTE — Progress Notes (Signed)
Hematology/Oncology Consult note [facility]  Telephone:([phone] [phone]  Patient Care Team: Juline Patch, MD as PCP - General (Family Medicine) Corey Skains, MD as Consulting Physician (Cardiology) Clent Jacks, RN as Oncology Nurse Navigator Lequita Asal, MD as Referring Physician (Hematology and Oncology) Ronny Bacon, MD as Consulting Physician (General Surgery)   Name of the patient: Phyllis Ross  [id number]  08/23/1935   Date of visit: 08/07/20  Diagnosis-stage IIIc colon cancer s/p hemicolectomy  Chief complaint/ Reason for visit-discuss further management of colon cancer  Heme/Onc history: Patient is a 85 year old female who was 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  She is s/p laparoscopic right hemicolectomy.  Pathology showed 5.2 x 4.3 x 2.7 cm grade 2 moderately differentiated adenocarcinoma with invasion through muscularis propria into pericolorectal tissue.  7 of 27 lymph nodes positive.  Lymphovascular invasion present but no perineural invasion.  MMR negative.  [id number].  Right hemicolectomy complicated by anastomotic breakdown/leak which was treated with antibiotics.  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  Also has a history of baseline microcytic anemia secondary to beta thalassemia and baseline hemoglobin is between 10-11.  Patient has met with Dr. Mike Gip in the past management chemotherapy options including oral Xeloda has been offered to the patient  Interval history-patient reports that she is slowly recovering her strength.  Appetite is slowly coming back.  Prior to surgery she was active and playing table tennis at competitive level.  She has lost about 30 pounds in the last 3 to 4 months but weight has remained stable in the last 3 weeks  ECOG  PS- 1-2 Pain scale- 0   Review of systems- Review of Systems  Constitutional: Positive for malaise/fatigue. Negative for chills, fever and weight loss.       Lack of appetite  HENT: Negative for congestion, ear discharge and nosebleeds.   Eyes: Negative for blurred vision.  Respiratory: Negative for cough, hemoptysis, sputum production, shortness of breath and wheezing.   Cardiovascular: Negative for chest pain, palpitations, orthopnea and claudication.  Gastrointestinal: Negative for abdominal pain, blood in stool, constipation, diarrhea, heartburn, melena, nausea and vomiting.  Genitourinary: Negative for dysuria, flank pain, frequency, hematuria and urgency.  Musculoskeletal: Negative for back pain, joint pain and myalgias.  Skin: Negative for rash.  Neurological: Negative for dizziness, tingling, focal weakness, seizures, weakness and headaches.  Endo/Heme/Allergies: Does not bruise/bleed easily.  Psychiatric/Behavioral: Negative for depression and suicidal ideas. The patient does not have insomnia.      No Known Allergies   Past Medical History:  Diagnosis Date  . Anemia   . Anxiety   . Cancer ([place])   . Hyperlipidemia   . Hypertension   . MI (myocardial infarction) ([place]) 2008   triple bypass  . Osteoporosis   . Pneumonia    as a baby  . Rheumatic fever      Past Surgical History:  Procedure Laterality Date  . COLONOSCOPY  2011   normal- Dr Vira Agar  . COLONOSCOPY WITH PROPOFOL N/A 06/04/2020   Procedure: COLONOSCOPY WITH PROPOFOL;  Surgeon: Lucilla Lame, MD;  Location: [place];  Service: Endoscopy;  Laterality: N/A;  . CORONARY ARTERY BYPASS GRAFT    . ESOPHAGOGASTRODUODENOSCOPY (EGD) WITH PROPOFOL N/A 06/04/2020   Procedure: ESOPHAGOGASTRODUODENOSCOPY (EGD) WITH PROPOFOL;  Surgeon: Lucilla Lame, MD;  Location: [place];  Service: Endoscopy;  Laterality: N/A;  . VAGINAL HYSTERECTOMY     DUB    Social History   Socioeconomic History  .  Marital status: Married    Spouse name: Not on file  . Number of children: 2  . Years of education: Not on file  . Highest education level: Some college, no degree  Occupational History    Employer: RETIRED  Tobacco Use  . Smoking status: Never Smoker  . Smokeless tobacco: Never Used  Vaping Use  . Vaping Use: Never used  Substance and Sexual Activity  . Alcohol use: No  . Drug use: No  . Sexual activity: Yes  Other Topics Concern  . Not on file  Social History Narrative  . Not on file   Social Determinants of Health   Financial Resource Strain: Low Risk   . Difficulty of Paying Living Expenses: Not hard at all  Food Insecurity: No Food Insecurity  . Worried About Charity fundraiser in the Last Year: Never true  . Ran Out of Food in the Last Year: Never true  Transportation Needs: No Transportation Needs  . Lack of Transportation (Medical): No  . Lack of Transportation (Non-Medical): No  Physical Activity: Sufficiently Active  . Days of Exercise per Week: 6 days  . Minutes of Exercise per Session: 40 min  Stress: No Stress Concern Present  . Feeling of Stress : Not at all  Social Connections: Socially Integrated  . Frequency of Communication with Friends and Family: More than three times a week  . Frequency of Social Gatherings with Friends and Family: More than three times a week  . Attends Religious Services: More than 4 times per year  . Active Member of Clubs or Organizations: Yes  . Attends Archivist Meetings: More than 4 times per year  . Marital Status: Married  Human resources officer Violence: Not At Risk  . Fear of Current or Ex-Partner: No  . Emotionally Abused: No  . Physically Abused: No  . Sexually Abused: No    Family History  Problem Relation Age of Onset  . Heart attack Mother   . Heart attack Maternal Uncle   . Heart attack Maternal Uncle      Current Outpatient Medications:  .  atorvastatin (LIPITOR) 80 MG tablet, 1 tablet daily  (Patient taking differently: Take 80 mg by mouth daily.), Disp: 90 tablet, Rfl: 1 .  ferrous sulfate 325 (65 FE) MG tablet, Take 325 mg by mouth daily with breakfast., Disp: , Rfl:  .  lisinopril (ZESTRIL) 10 MG tablet, TAKE (1) TABLET BY MOUTH TWICE DAILY (Patient taking differently: Take 10 mg by mouth 2 (two) times daily.), Disp: 180 tablet, Rfl: 1 .  megestrol (MEGACE) 40 MG tablet, Take 1 tablet (40 mg total) by mouth 2 (two) times daily., Disp: 60 tablet, Rfl: 1 .  traZODone (DESYREL) 50 MG tablet, Take 0.5-1 tablets (25-50 mg total) by mouth at bedtime as needed for sleep., Disp: 30 tablet, Rfl: 5 .  ALPRAZolam (XANAX) 0.25 MG tablet, Take 1 tablet (0.25 mg total) by mouth daily as needed for anxiety. (Patient not taking: No sig reported), Disp: 30 tablet, Rfl: 5 .  HYDROcodone-acetaminophen (NORCO/VICODIN) 5-325 MG tablet, Take 1 tablet by mouth every 6 (six) hours as needed for severe pain (breakthrough pain). (Patient not taking: No sig reported), Disp: 15 tablet, Rfl: 0 .  loperamide (IMODIUM) 2 MG capsule, Take 2 mg by mouth as needed for  diarrhea or loose stools. (Patient not taking: Reported on 08/06/2020), Disp: , Rfl:  .  ondansetron (ZOFRAN ODT) 4 MG disintegrating tablet, Take 1 tablet (4 mg total) by mouth every 8 (eight) hours as needed for nausea or vomiting. (Patient not taking: No sig reported), Disp: 30 tablet, Rfl: 0 .  Sodium Sulfate-Mag Sulfate-KCl (SUTAB) [phone] MG TABS, Take 376 mg by mouth as directed. (Patient not taking: No sig reported), Disp: 24 tablet, Rfl: 0  Physical exam:  Vitals:   08/06/20 1513  BP: 102/63  Pulse: 74  Temp: 98.3 F (36.8 C)  TempSrc: Tympanic  SpO2: 94%  Weight: 106 lb 9.6 oz (48.4 kg)   Physical Exam Constitutional:      General: She is not in acute distress.    Comments: She is sitting in a wheelchair and appears fatigued  Cardiovascular:     Rate and Rhythm: Normal rate and regular rhythm.     Heart sounds: Normal heart  sounds.  Pulmonary:     Effort: Pulmonary effort is normal.     Breath sounds: Normal breath sounds.  Abdominal:     General: Bowel sounds are normal.     Palpations: Abdomen is soft.  Skin:    General: Skin is warm and dry.  Neurological:     Mental Status: She is alert and oriented to person, place, and time.      CMP Latest Ref Rng & Units 07/23/2020  Glucose 70 - 99 mg/dL 118(H)  BUN 8 - 23 mg/dL 18  Creatinine 0.44 - 1.00 mg/dL 1.03(H)  Sodium 135 - 145 mmol/L 137  Potassium 3.5 - 5.1 mmol/L 3.4(L)  Chloride 98 - 111 mmol/L 105  CO2 22 - 32 mmol/L 24  Calcium 8.9 - 10.3 mg/dL 8.9  Total Protein 6.5 - 8.1 g/dL 7.4  Total Bilirubin 0.3 - 1.2 mg/dL 0.7  Alkaline Phos 38 - 126 U/L 59  AST 15 - 41 U/L 19  ALT 0 - 44 U/L 12   CBC Latest Ref Rng & Units 07/23/2020  WBC 4.0 - 10.5 K/uL 9.2  Hemoglobin 12.0 - 15.0 g/dL 9.2(L)  Hematocrit 36.0 - 46.0 % 29.9(L)  Platelets 150 - 400 K/uL 395    No images are attached to the encounter.  CT CHEST WO CONTRAST  Result Date: 08/06/2020 CLINICAL DATA:  Colorectal cancer staging in a 85 year old female EXAM: CT CHEST WITHOUT CONTRAST TECHNIQUE: Multidetector CT imaging of the chest was performed following the standard protocol without IV contrast. COMPARISON:  June 24, 2020 abdomen and pelvis CT. FINDINGS: Cardiovascular: Calcified atheromatous plaque of the thoracic aorta. Median sternotomy for coronary revascularization. Dilated ascending thoracic aorta, mildly dilated to 3.7 cm. Heart size normal. Calcifications in native coronary vasculature. Central pulmonary vessels with normal caliber. Limited assessment of cardiovascular structures given lack of intravenous contrast. Mediastinum/Nodes: Esophagus patulous with some debris in the lumen. No thoracic inlet lymphadenopathy. No axillary lymphadenopathy. No mediastinal lymphadenopathy. Lungs/Pleura: No effusion. No consolidation. Subpleural reticulation at the lung bases. Small nodule at the  RIGHT lung base measures 12 x 5 mm. (Image 136/2) Biapical pleural and parenchymal scarring with some bronchiectasis at the lung apices showing mild asymmetry RIGHT slightly greater than LEFT. Upper Abdomen: Incidental imaging of upper abdominal contents without acute process. Stable peripherally calcified splenic artery aneurysm at approximately 14 mm greatest size. Imaged portions of pancreas, spleen and adrenal glands are normal. Partially imaged large LEFT renal cyst. Musculoskeletal: Spinal degenerative changes. Post median sternotomy. No acute or destructive  bone process. IMPRESSION: 1. Small nodule at the RIGHT lung base measures 12 x 5 mm. Not changed compared to very recent imaging. Findings are nonspecific given that it is unifocal, neoplasm or post inflammatory changes are considered. More remote priors for comparison do not show this abnormality. Consider short interval follow-up or PET imaging for further assessment as warranted. 2. Signs of subpleural reticulation at the lung bases, also with apical septal thickening and pleural and parenchymal scarring. Correlate with any respiratory symptoms. Findings could be related to interstitial disease. Findings at the lung bases could also be due to aspiration given patulous debris-filled esophagus. Follow-up with high-resolution chest CT as warranted. 3. Stable peripherally calcified splenic artery aneurysm at approximately 14 mm greatest size. 4. Dilated ascending thoracic aorta, mildly dilated to 3.7 cm. Aortic Atherosclerosis (ICD10-I70.0). Electronically Signed   By: Zetta Bills M.D.   On: 08/06/2020 11:29     Assessment and plan- Patient is a 85 y.o. female with stage IIIc adenocarcinoma of the colon noted to discuss further management.  Patient presented with her daughter and son today.  I again went over the results of final pathology.  As per the [facility] nomogram her 3-year disease-free survival without chemotherapy is 25% and 5 years is 15%.   With chemotherapy her 3-year disease-free survival increases to 65%.  Patient is to recuperating from her surgery and she is now almost 6 weeks out.  Discussed that adjuvant chemotherapy should ideally begin within 8 weeks of surgery.  Given her present frailty and age I would not recommend adding oxaliplatin.  Both infusional 5-FU chemotherapy as well as Xeloda are reasonable options to consider.  Discussed risks and benefits of Xeloda including all but not limited to nausea, vomiting, abdominal pain, skin rash and diarrhea.  Discussed risks and benefits of infusional 5-FU chemotherapy including all but not limited to nausea, vomiting, diarrhea, low blood counts, risk of infections and hospitalizations.  Patient prefers to try Xeloda first.  I would like to try a dose reduced regimen of approximately a 30 mg per metered square which would allow the patient to take 1500 mg in the morning and 1000 mg in the evening and reduce her pill burden as well.  This would be given 2 weeks on 1 week off for 8 cycles.  We will work on Print production planner authorization for the same and I will tentatively see her back in 2 weeks to start Xeloda.  Treatment will be given with a curative intent  Discussed results of CT chest without contrast which showed 12 x 5 mm right lower lobe lung nodule.  Unclear if this is inflammatory versus concerning for malignancy.  The plan is to repeat a CT scan 3 months from now.  Hold off on PET scan or biopsy at this time  B12 deficiency: Patient prefers to take oral B12 at this time instead of B12 shots.  Continue to monitor  Microcytic anemia: Patient's hemoglobin is about 1-2.7 from her baseline.  It is presently 9.2 and she typically lives between 10-11 secondary to beta thalassemia.   Total face to face encounter time for this patient visit was 40 min.   Visit Diagnosis 1. Cecal cancer ([place])   2. Goals of care, counseling/discussion      Dr. Randa Evens, MD, MPH [facility] [phone] 08/07/2020 12:44 PM

## 2020-08-07 NOTE — Progress Notes (Signed)
[place]  Telephone:([phone] [phone]  Patient Care Team: Juline Patch, MD as PCP - General (Family Medicine) Corey Skains, MD as Consulting Physician (Cardiology) Clent Jacks, RN as Oncology Nurse Navigator Lequita Asal, MD as Referring Physician (Hematology and Oncology) Ronny Bacon, MD as Consulting Physician (General Surgery)   Name of the patient: Phyllis Ross  [id number]  1935-07-30   Date of visit: 08/07/20  HPI: Patient is a 85 y.o. female with stage IIIC adenocarcinoma of the cecum. Planned adjuvant treatment with Xeloda (capecitabine).   Reason for Consult: Xeloda (capecitabine) oral chemotherapy education.   PAST MEDICAL HISTORY: Past Medical History:  Diagnosis Date  . Anemia   . Anxiety   . Cancer ([place])   . Hyperlipidemia   . Hypertension   . MI (myocardial infarction) ([place]) 2008   triple bypass  . Osteoporosis   . Pneumonia    as a baby  . Rheumatic fever     HEMATOLOGY/ONCOLOGY HISTORY:  Oncology History  Cecal cancer ([place])  06/15/2020 Initial Diagnosis   Cecal cancer ([place])   06/15/2020 Cancer Staging   Staging form: Colon and Rectum, AJCC 8th Edition - Clinical stage from 06/15/2020: Stage IIIC (cT3, cN2b, cM0) - Signed by Lequita Asal, MD on 07/23/2020 Histopathologic type: Adenocarcinoma, NOS Stage prefix: Initial diagnosis Total positive nodes: 7 Total nodes examined: 27 Histologic grade (G): G2 Histologic grading system: 4 grade system Laterality: Right Tumor size (mm): 7 Lymph-vascular invasion (LVI): LVI present/identified, NOS Diagnostic confirmation: Positive histology Specimen type: Excision Staged by: Managing physician Tumor deposits (TD): Unknown Carcinoembryonic antigen (CEA) (ng/mL): 524 Perineural invasion (PNI): Absent Microsatellite instability (MSI): Stable KRAS mutation: Unknown NRAS mutation: Unknown BRAF Mutation:  Unknown Stage used in treatment planning: Yes National guidelines used in treatment planning: Yes Type of national guideline used in treatment planning: NCCN     ALLERGIES:  has No Known Allergies.  MEDICATIONS:  Current Outpatient Medications  Medication Sig Dispense Refill  . ALPRAZolam (XANAX) 0.25 MG tablet Take 1 tablet (0.25 mg total) by mouth daily as needed for anxiety. (Patient not taking: No sig reported) 30 tablet 5  . atorvastatin (LIPITOR) 80 MG tablet 1 tablet daily (Patient taking differently: Take 80 mg by mouth daily.) 90 tablet 1  . ferrous sulfate 325 (65 FE) MG tablet Take 325 mg by mouth daily with breakfast.    . HYDROcodone-acetaminophen (NORCO/VICODIN) 5-325 MG tablet Take 1 tablet by mouth every 6 (six) hours as needed for severe pain (breakthrough pain). (Patient not taking: No sig reported) 15 tablet 0  . lisinopril (ZESTRIL) 10 MG tablet TAKE (1) TABLET BY MOUTH TWICE DAILY (Patient taking differently: Take 10 mg by mouth 2 (two) times daily.) 180 tablet 1  . loperamide (IMODIUM) 2 MG capsule Take 2 mg by mouth as needed for diarrhea or loose stools. (Patient not taking: Reported on 08/06/2020)    . megestrol (MEGACE) 40 MG tablet Take 1 tablet (40 mg total) by mouth 2 (two) times daily. 60 tablet 1  . ondansetron (ZOFRAN ODT) 4 MG disintegrating tablet Take 1 tablet (4 mg total) by mouth every 8 (eight) hours as needed for nausea or vomiting. (Patient not taking: No sig reported) 30 tablet 0  . Sodium Sulfate-Mag Sulfate-KCl (SUTAB) [phone] MG TABS Take 376 mg by mouth as directed. (Patient not taking: No sig reported) 24 tablet 0  . traZODone (DESYREL) 50 MG tablet Take 0.5-1 tablets (25-50 mg total) by  mouth at bedtime as needed for sleep. 30 tablet 5   No current facility-administered medications for this visit.    VITAL SIGNS: There were no vitals taken for this visit. There were no vitals filed for this visit.  Estimated body mass index is 17.74 kg/m  as calculated from the following:   Height as of 07/20/20: _0  (1.651 m).   Weight as of an earlier encounter on 08/06/20: 48.4 kg (106 lb 9.6 oz).  LABS: CBC:    Component Value Date/Time   WBC 9.2 07/23/2020 1557   HGB 9.2 (L) 07/23/2020 1557   HGB 10.9 (L) 04/07/2020 0914   HCT 29.9 (L) 07/23/2020 1557   HCT 37.3 04/07/2020 0914   PLT 395 07/23/2020 1557   PLT 302 04/07/2020 0914   MCV 65.4 (L) 07/23/2020 1557   MCV 67 (L) 04/07/2020 0914   MCV 66 (L) 06/22/2012 1026   NEUTROABS 6.9 07/23/2020 1557   NEUTROABS 5.6 04/07/2020 0914   NEUTROABS 5.3 06/22/2012 1026   LYMPHSABS 1.6 07/23/2020 1557   LYMPHSABS 1.5 04/07/2020 0914   LYMPHSABS 2.3 06/22/2012 1026   MONOABS 0.5 07/23/2020 1557   MONOABS 0.6 06/22/2012 1026   EOSABS 0.1 07/23/2020 1557   EOSABS 0.2 04/07/2020 0914   EOSABS 0.2 06/22/2012 1026   BASOSABS 0.0 07/23/2020 1557   BASOSABS 0.1 04/07/2020 0914   BASOSABS 0.1 06/22/2012 1026   Comprehensive Metabolic Panel:    Component Value Date/Time   NA 137 07/23/2020 1557   NA 142 04/07/2020 0914   NA 143 06/22/2012 1026   K 3.4 (L) 07/23/2020 1557   K 4.4 06/22/2012 1026   CL 105 07/23/2020 1557   CL 103 06/22/2012 1026   CO2 24 07/23/2020 1557   CO2 30 06/22/2012 1026   BUN 18 07/23/2020 1557   BUN 20 04/07/2020 0914   BUN 16 06/22/2012 1026   CREATININE 1.03 (H) 07/23/2020 1557   CREATININE 1.19 06/22/2012 1026   GLUCOSE 118 (H) 07/23/2020 1557   GLUCOSE 126 (H) 06/22/2012 1026   CALCIUM 8.9 07/23/2020 1557   CALCIUM 9.2 06/22/2012 1026   AST 19 07/23/2020 1557   AST 22 06/22/2012 1026   ALT 12 07/23/2020 1557   ALT 26 06/22/2012 1026   ALKPHOS 59 07/23/2020 1557   ALKPHOS 98 06/22/2012 1026   BILITOT 0.7 07/23/2020 1557   BILITOT 0.5 04/07/2020 0914   BILITOT 0.8 06/22/2012 1026   PROT 7.4 07/23/2020 1557   PROT 6.6 04/07/2020 0914   PROT 7.4 06/22/2012 1026   ALBUMIN 3.0 (L) 07/23/2020 1557   ALBUMIN 3.9 04/07/2020 0914   ALBUMIN 3.4  06/22/2012 1026     Present during today's visit: Phyllis Ross and her 2 children Phyllis Ross and Phyllis Ross  Assessment and Plan: Start plan: Phyllis Ross is going on a beach trip with her family. The plan is to get started in Xeloda once she returns.   CMP from 07/23/20 assessed, SCr elevated from her normal at 1.55m/dL, CrCl ~358mmin. She will be started on a reduced dose of capecitabine ~825 mg/m2/dose (165073m2/day). Continue to monitor renal function. Prescription dose and frequency assessed.   Current medication list in Epic reviewed, no relevant DDIs with capecitabine identified.  Patient Education I spoke with patient for overview of new oral chemotherapy medication: capecitabine   Administration: Counseled patient on administration, dosing, side effects, monitoring, drug-food interactions, safe handling, storage, and disposal.  Side Effects: Side effects include but not limited to: diarrhea, hand-foot syndrome, N/V, fatigue, decreased  wbc/hgb/plt, mouth irritation.    Adherence: After discussion with patient no patient barriers to medication adherence identified. Her daughter helps with her medications. Provided her with an AM/PM medication pill tray. Reviewed with patient importance of keeping a medication schedule and plan for any missed doses.  Phyllis Ross and her children voiced understanding and appreciation. All questions answered. Medication handout provided.  Provided patient with [facility] phone number. Patient knows to call the office with questions or concerns. Oral [facility] will continue to follow.  Patient expressed understanding and was in agreement with this plan. She also understands that She can call clinic at any time with any questions, concerns, or complaints.   Medication Access Issues: Currently pending capecitabine insurance approval  Follow-up plan: pharmacy clinic follow-up pending Xeloda start date  Thank  you for allowing me to participate in the care of this patient.   Time Total: 45 mins  Visit consisted of counseling and education on dealing with issues of symptom management in the setting of serious and potentially life-threatening illness.Greater than 50%  of this time was spent counseling and coordinating care related to the above assessment and plan.  Signed by: Darl Pikes, PharmD, BCPS, Salley Slaughter, CPP Hematology/Oncology Clinical Pharmacist Practitioner [facility]/HP/[facility] [phone]  08/07/2020 11:32 AM

## 2020-08-08 ENCOUNTER — Other Ambulatory Visit (HOSPITAL_COMMUNITY): Payer: Self-pay

## 2020-08-10 ENCOUNTER — Other Ambulatory Visit (HOSPITAL_COMMUNITY): Payer: Self-pay

## 2020-08-10 NOTE — Telephone Encounter (Signed)
Oral Oncology Patient Advocate Encounter  Prior Authorization for Xeloda has been approved under part B benefit.    PA# [id number] Effective dates: 08/07/20 through 04/03/21  Patients co-pay is $50.00  Oral Oncology Clinic will continue to follow.   [place] Patient [place] Phone [phone] Fax [phone] 08/10/2020 9:58 AM

## 2020-08-11 ENCOUNTER — Other Ambulatory Visit (HOSPITAL_COMMUNITY): Payer: Self-pay

## 2020-08-11 NOTE — Telephone Encounter (Signed)
Oral Oncology Patient Advocate Encounter  I spoke with Colletta [place], patients daughter, this morning to set up delivery of Xeloda.  Address verified for shipment.  Xeloda will be filled through [facility] and mailed 08/18/20 for delivery 08/19/20. Apolonio Schneiders that her mother is not to start the medication but bring to her appt on 5/25.    [facility] will call 7-10 days before next refill is due to complete adherence call and set up delivery of medication.     [place] Patient [place] Phone [phone] Fax [phone] 08/11/2020 1:25 PM

## 2020-08-17 ENCOUNTER — Other Ambulatory Visit (HOSPITAL_COMMUNITY): Payer: Self-pay

## 2020-08-17 DIAGNOSIS — Z483 Aftercare following surgery for neoplasm: Secondary | ICD-10-CM | POA: Diagnosis not present

## 2020-08-17 DIAGNOSIS — Z79891 Long term (current) use of opiate analgesic: Secondary | ICD-10-CM | POA: Diagnosis not present

## 2020-08-17 DIAGNOSIS — Z791 Long term (current) use of non-steroidal anti-inflammatories (NSAID): Secondary | ICD-10-CM | POA: Diagnosis not present

## 2020-08-17 DIAGNOSIS — Z792 Long term (current) use of antibiotics: Secondary | ICD-10-CM | POA: Diagnosis not present

## 2020-08-17 DIAGNOSIS — Z9049 Acquired absence of other specified parts of digestive tract: Secondary | ICD-10-CM | POA: Diagnosis not present

## 2020-08-17 DIAGNOSIS — Z4682 Encounter for fitting and adjustment of non-vascular catheter: Secondary | ICD-10-CM | POA: Diagnosis not present

## 2020-08-17 DIAGNOSIS — L0291 Cutaneous abscess, unspecified: Secondary | ICD-10-CM | POA: Diagnosis not present

## 2020-08-17 DIAGNOSIS — T8141XD Infection following a procedure, superficial incisional surgical site, subsequent encounter: Secondary | ICD-10-CM | POA: Diagnosis not present

## 2020-08-17 DIAGNOSIS — C180 Malignant neoplasm of cecum: Secondary | ICD-10-CM | POA: Diagnosis not present

## 2020-08-18 ENCOUNTER — Other Ambulatory Visit (HOSPITAL_COMMUNITY): Payer: Self-pay

## 2020-08-19 ENCOUNTER — Encounter: Payer: Self-pay | Admitting: Family Medicine

## 2020-08-20 ENCOUNTER — Other Ambulatory Visit: Payer: Self-pay

## 2020-08-20 ENCOUNTER — Other Ambulatory Visit (HOSPITAL_COMMUNITY): Payer: Self-pay

## 2020-08-20 DIAGNOSIS — C180 Malignant neoplasm of cecum: Secondary | ICD-10-CM

## 2020-08-21 ENCOUNTER — Inpatient Hospital Stay: Payer: Medicare PPO

## 2020-08-21 ENCOUNTER — Other Ambulatory Visit (HOSPITAL_COMMUNITY): Payer: Self-pay

## 2020-08-21 ENCOUNTER — Other Ambulatory Visit: Payer: Self-pay

## 2020-08-21 NOTE — Progress Notes (Signed)
Nutrition Follow-up:  Patient with stage III cecal cancer s/p open colectomy.  Planning to start xeloda.    Called patient for nutrition follow-up.  Patient reports that her appetite is better.  She and her husband went with 6 other couples to beach recently and had a great time. Appetite was good and couples cooked meals and she ate well.  She reports feeling stronger.  This am ate canadian bacon, eggs, toast, fruit, coffee and juice.  Last night for dinner ate barbecued [place], candied yams and slaw.  Drinking ensure about 2 times per day. Denies nausea or diarrhea.    Has questions about not receiving xeloda medication.      Medications: reviewed  Labs: reviewed  Anthropometrics:   Weight 106 lb on 5/5  107 lb 4/21  UBW in 120s   NUTRITION DIAGNOSIS: Inadequate oral intake improving    INTERVENTION:  Encouraged patient to continue to eat high calorie, high protein foods Encouraged drinking 2-3 ensure (350 calorie) daily Spoke with Clearnce Sorrel, Pharmacist and [employer] has been speaking with patient's daughter today about chemo pill.  Encouraged patient to speak with daughter regarding chemo medication.      MONITORING, EVALUATION, GOAL: weight trends, intake   NEXT VISIT: Friday, June 10th phone call  Juda Toepfer B. Zenia Resides, [place], [place] Registered Dietitian [phone] (mobile)

## 2020-08-24 ENCOUNTER — Other Ambulatory Visit (HOSPITAL_COMMUNITY): Payer: Self-pay

## 2020-08-24 NOTE — Telephone Encounter (Signed)
Due to a delay at the pharmacy patients prescription was not shipped out until 5/20.  Checked delivery status this morning and package was delivered on Saturday morning to front porch.  [place] Patient [place] Phone [phone] Fax [phone] 08/24/2020 8:53 AM

## 2020-08-26 ENCOUNTER — Inpatient Hospital Stay (HOSPITAL_BASED_OUTPATIENT_CLINIC_OR_DEPARTMENT_OTHER): Payer: Medicare PPO | Admitting: Oncology

## 2020-08-26 ENCOUNTER — Other Ambulatory Visit: Payer: Self-pay

## 2020-08-26 ENCOUNTER — Encounter: Payer: Self-pay | Admitting: Oncology

## 2020-08-26 ENCOUNTER — Inpatient Hospital Stay: Payer: Medicare PPO

## 2020-08-26 VITALS — BP 114/62 | HR 78 | Temp 97.9°F | Resp 14 | Wt 106.0 lb

## 2020-08-26 DIAGNOSIS — Z79899 Other long term (current) drug therapy: Secondary | ICD-10-CM | POA: Diagnosis not present

## 2020-08-26 DIAGNOSIS — I10 Essential (primary) hypertension: Secondary | ICD-10-CM | POA: Diagnosis not present

## 2020-08-26 DIAGNOSIS — I252 Old myocardial infarction: Secondary | ICD-10-CM | POA: Diagnosis not present

## 2020-08-26 DIAGNOSIS — C180 Malignant neoplasm of cecum: Secondary | ICD-10-CM

## 2020-08-26 DIAGNOSIS — Z9049 Acquired absence of other specified parts of digestive tract: Secondary | ICD-10-CM | POA: Diagnosis not present

## 2020-08-26 DIAGNOSIS — E785 Hyperlipidemia, unspecified: Secondary | ICD-10-CM | POA: Diagnosis not present

## 2020-08-26 LAB — COMPREHENSIVE METABOLIC PANEL
ALT: 35 U/L (ref 0–44)
AST: 33 U/L (ref 15–41)
Albumin: 3.4 g/dL — ABNORMAL LOW (ref 3.5–5.0)
Alkaline Phosphatase: 51 U/L (ref 38–126)
Anion gap: 6 (ref 5–15)
BUN: 15 mg/dL (ref 8–23)
CO2: 27 mmol/L (ref 22–32)
Calcium: 8.9 mg/dL (ref 8.9–10.3)
Chloride: 106 mmol/L (ref 98–111)
Creatinine, Ser: 0.86 mg/dL (ref 0.44–1.00)
GFR, Estimated: >60 mL/min (ref >60)
Glucose, Bld: 107 mg/dL — ABNORMAL HIGH (ref 70–99)
Potassium: 3.5 mmol/L (ref 3.5–5.1)
Sodium: 139 mmol/L (ref 135–145)
Total Bilirubin: 1.2 mg/dL (ref 0.3–1.2)
Total Protein: 7 g/dL (ref 6.5–8.1)

## 2020-08-26 LAB — CBC WITH DIFFERENTIAL/PLATELET
Abs Immature Granulocytes: 0.04 10*3/uL (ref 0.00–0.07)
Basophils Absolute: 0.1 10*3/uL (ref 0.0–0.1)
Basophils Relative: 1 %
Eosinophils Absolute: 0.1 10*3/uL (ref 0.0–0.5)
Eosinophils Relative: 1 %
HCT: 30.3 % — ABNORMAL LOW (ref 36.0–46.0)
Hemoglobin: 9.3 g/dL — ABNORMAL LOW (ref 12.0–15.0)
Immature Granulocytes: 1 %
Lymphocytes Relative: 24 %
Lymphs Abs: 1.8 10*3/uL (ref 0.7–4.0)
MCH: 20.1 pg — ABNORMAL LOW (ref 26.0–34.0)
MCHC: 30.7 g/dL (ref 30.0–36.0)
MCV: 65.6 fL — ABNORMAL LOW (ref 80.0–100.0)
Monocytes Absolute: 0.5 10*3/uL (ref 0.1–1.0)
Monocytes Relative: 7 %
Neutro Abs: 4.9 10*3/uL (ref 1.7–7.7)
Neutrophils Relative %: 66 %
Platelets: 304 10*3/uL (ref 150–400)
RBC: 4.62 MIL/uL (ref 3.87–5.11)
RDW: 18.1 % — ABNORMAL HIGH (ref 11.5–15.5)
WBC: 7.4 10*3/uL (ref 4.0–10.5)
nRBC: 0 % (ref 0.0–0.2)

## 2020-08-26 MED ORDER — UREA 10 % EX OINT
1.0000 | TOPICAL_OINTMENT | Freq: Two times a day (BID) | CUTANEOUS | 0 refills | Status: DC
Start: 2020-08-26 — End: 2021-02-05

## 2020-08-27 ENCOUNTER — Telehealth: Payer: Self-pay | Admitting: *Deleted

## 2020-08-27 LAB — CEA: CEA: 5.9 ng/mL — ABNORMAL HIGH (ref 0.0–4.7)

## 2020-08-27 NOTE — Telephone Encounter (Signed)
Renee from [facility] called and states that Urea Ointment is not available and asks if Cream can be substituted for the Ointment. Please advise

## 2020-08-27 NOTE — Telephone Encounter (Signed)
Call returned to [facility] and informed OK to use the Urea Cream

## 2020-08-27 NOTE — Telephone Encounter (Signed)
Yes that's fine to substitute

## 2020-08-29 NOTE — Progress Notes (Signed)
Hematology/Oncology Consult note [facility]  [phone] [phone]  Patient Care Team: Juline Patch, MD as PCP - General (Family Medicine) Corey Skains, MD as Consulting Physician (Cardiology) Clent Jacks, RN as Oncology Nurse Navigator Lequita Asal, MD as Referring Physician (Hematology and Oncology) Ronny Bacon, MD as Consulting Physician (General Surgery)   Name of the patient: Phyllis Ross  [id number]  22-Mar-1936   Date of visit: 08/29/20  Diagnosis- stage IIIc colon cancer s/p hemicolectomy   Chief complaint/ Reason for visit-routine follow-up house colon cancer currently on adjuvant Xeloda  Heme/Onc history: Patient is a 85 year old female who was 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  She is s/p laparoscopic right hemicolectomy.  Pathology showed 5.2 x 4.3 x 2.7 cm grade 2 moderately differentiated adenocarcinoma with invasion through muscularis propria into pericolorectal tissue.  7 of 27 lymph nodes positive.  Lymphovascular invasion present but no perineural invasion.  MMR negative.  [id number].  Right hemicolectomy complicated by anastomotic breakdown/leak which was treated with antibiotics.  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  Also has a history of baseline microcytic anemia secondary to beta thalassemia and baseline hemoglobin is between 10-11.  Patient has met with Dr. Mike Gip in the past management chemotherapy options including oral Xeloda has been offered to the patient  Patient started adjuvant Xeloda on 08/23/2020  Interval history- patient had a good time when she went for her beach vacation.  Reports that her appetite is improving and so is her level of Energy.  She has not lost any further weight but has not gained any weight either.  She  has been on Xeloda for about 3 days now and tolerating it well without any significant side effects.  Denies any nausea.  Bowel movements have been regular  ECOG PS- 2 Pain scale- 0  Review of systems- Review of Systems  Constitutional: Positive for malaise/fatigue. Negative for chills, fever and weight loss.  HENT: Negative for congestion, ear discharge and nosebleeds.   Eyes: Negative for blurred vision.  Respiratory: Negative for cough, hemoptysis, sputum production, shortness of breath and wheezing.   Cardiovascular: Negative for chest pain, palpitations, orthopnea and claudication.  Gastrointestinal: Negative for abdominal pain, blood in stool, constipation, diarrhea, heartburn, melena, nausea and vomiting.  Genitourinary: Negative for dysuria, flank pain, frequency, hematuria and urgency.  Musculoskeletal: Negative for back pain, joint pain and myalgias.  Skin: Negative for rash.  Neurological: Negative for dizziness, tingling, focal weakness, seizures, weakness and headaches.  Endo/Heme/Allergies: Does not bruise/bleed easily.  Psychiatric/Behavioral: Negative for depression and suicidal ideas. The patient does not have insomnia.       No Known Allergies   Past Medical History:  Diagnosis Date  . Anemia   . Anxiety   . Cancer ([place])   . Hyperlipidemia   . Hypertension   . MI (myocardial infarction) ([place]) 2008   triple bypass  . Osteoporosis   . Pneumonia    as a baby  . Rheumatic fever      Past Surgical History:  Procedure Laterality Date  . COLONOSCOPY  2011   normal- Dr Vira Agar  . COLONOSCOPY WITH PROPOFOL N/A 06/04/2020   Procedure: COLONOSCOPY WITH PROPOFOL;  Surgeon: Lucilla Lame, MD;  Location: [place];  Service: Endoscopy;  Laterality: N/A;  . CORONARY ARTERY BYPASS GRAFT    .  ESOPHAGOGASTRODUODENOSCOPY (EGD) WITH PROPOFOL N/A 06/04/2020   Procedure: ESOPHAGOGASTRODUODENOSCOPY (EGD) WITH PROPOFOL;  Surgeon: Lucilla Lame, MD;  Location: [place];  Service: Endoscopy;  Laterality: N/A;  . VAGINAL HYSTERECTOMY     DUB    Social History   Socioeconomic History  . Marital status: Married    Spouse name: Not on file  . Number of children: 2  . Years of education: Not on file  . Highest education level: Some college, no degree  Occupational History    Employer: RETIRED  Tobacco Use  . Smoking status: Never Smoker  . Smokeless tobacco: Never Used  Vaping Use  . Vaping Use: Never used  Substance and Sexual Activity  . Alcohol use: No  . Drug use: No  . Sexual activity: Yes  Other Topics Concern  . Not on file  Social History Narrative  . Not on file   Social Determinants of Health   Financial Resource Strain: Low Risk   . Difficulty of Paying Living Expenses: Not hard at all  Food Insecurity: No Food Insecurity  . Worried About Charity fundraiser in the Last Year: Never true  . Ran Out of Food in the Last Year: Never true  Transportation Needs: No Transportation Needs  . Lack of Transportation (Medical): No  . Lack of Transportation (Non-Medical): No  Physical Activity: Sufficiently Active  . Days of Exercise per Week: 6 days  . Minutes of Exercise per Session: 40 min  Stress: No Stress Concern Present  . Feeling of Stress : Not at all  Social Connections: Socially Integrated  . Frequency of Communication with Friends and Family: More than three times a week  . Frequency of Social Gatherings with Friends and Family: More than three times a week  . Attends Religious Services: More than 4 times per year  . Active Member of Clubs or Organizations: Yes  . Attends Archivist Meetings: More than 4 times per year  . Marital Status: Married  Human resources officer Violence: Not At Risk  . Fear of Current or Ex-Partner: No  . Emotionally Abused: No  . Physically Abused: No  . Sexually Abused: No    Family History  Problem Relation Age of Onset  . Heart attack Mother   . Heart attack Maternal  Uncle   . Heart attack Maternal Uncle      Current Outpatient Medications:  .  atorvastatin (LIPITOR) 80 MG tablet, 1 tablet daily (Patient taking differently: Take 80 mg by mouth daily.), Disp: 90 tablet, Rfl: 1 .  capecitabine (XELODA) 500 MG tablet, Take 3 tablets (1550m) by mouth in AM and 2 tablets (10083m in PM. Take with food. Take for 14 days, then hold for 7 days. Repeat every 21 days., Disp: 70 tablet, Rfl: 0 .  ferrous sulfate 325 (65 FE) MG tablet, Take 325 mg by mouth daily with breakfast., Disp: , Rfl:  .  lisinopril (ZESTRIL) 10 MG tablet, TAKE (1) TABLET BY MOUTH TWICE DAILY (Patient taking differently: Take 10 mg by mouth 2 (two) times daily.), Disp: 180 tablet, Rfl: 1 .  megestrol (MEGACE) 40 MG tablet, Take 1 tablet (40 mg total) by mouth 2 (two) times daily., Disp: 60 tablet, Rfl: 1 .  traZODone (DESYREL) 50 MG tablet, Take 0.5-1 tablets (25-50 mg total) by mouth at bedtime as needed for sleep., Disp: 30 tablet, Rfl: 5 .  Urea 10 % OINT, Apply 1 Dose topically in the morning and at bedtime. To hands and  feet from side effects of xeloda, Disp: 454 g, Rfl: 0 .  ALPRAZolam (XANAX) 0.25 MG tablet, Take 1 tablet (0.25 mg total) by mouth daily as needed for anxiety. (Patient not taking: No sig reported), Disp: 30 tablet, Rfl: 5 .  HYDROcodone-acetaminophen (NORCO/VICODIN) 5-325 MG tablet, Take 1 tablet by mouth every 6 (six) hours as needed for severe pain (breakthrough pain). (Patient not taking: No sig reported), Disp: 15 tablet, Rfl: 0 .  loperamide (IMODIUM) 2 MG capsule, Take 2 mg by mouth as needed for diarrhea or loose stools. (Patient not taking: No sig reported), Disp: , Rfl:  .  ondansetron (ZOFRAN ODT) 4 MG disintegrating tablet, Take 1 tablet (4 mg total) by mouth every 8 (eight) hours as needed for nausea or vomiting. (Patient not taking: No sig reported), Disp: 30 tablet, Rfl: 0 .  Sodium Sulfate-Mag Sulfate-KCl (SUTAB) [phone] MG TABS, Take 376 mg by mouth as  directed. (Patient not taking: No sig reported), Disp: 24 tablet, Rfl: 0  Physical exam:  Vitals:   08/26/20 1354  BP: 114/62  Pulse: 78  Resp: 14  Temp: 97.9 F (36.6 C)  SpO2: 100%  Weight: 106 lb 0.7 oz (48.1 kg)   Physical Exam Constitutional:      Comments: Patient is sitting in a wheelchair.  Appears somewhat fatigued  Cardiovascular:     Rate and Rhythm: Normal rate and regular rhythm.     Heart sounds: Normal heart sounds.  Pulmonary:     Effort: Pulmonary effort is normal.     Breath sounds: Normal breath sounds.  Abdominal:     General: Bowel sounds are normal.     Palpations: Abdomen is soft.  Musculoskeletal:     Right lower leg: No edema.     Left lower leg: No edema.  Skin:    General: Skin is warm and dry.  Neurological:     Mental Status: She is alert and oriented to person, place, and time.      CMP Latest Ref Rng & Units 08/26/2020  Glucose 70 - 99 mg/dL 107(H)  BUN 8 - 23 mg/dL 15  Creatinine 0.44 - 1.00 mg/dL 0.86  Sodium 135 - 145 mmol/L 139  Potassium 3.5 - 5.1 mmol/L 3.5  Chloride 98 - 111 mmol/L 106  CO2 22 - 32 mmol/L 27  Calcium 8.9 - 10.3 mg/dL 8.9  Total Protein 6.5 - 8.1 g/dL 7.0  Total Bilirubin 0.3 - 1.2 mg/dL 1.2  Alkaline Phos 38 - 126 U/L 51  AST 15 - 41 U/L 33  ALT 0 - 44 U/L 35   CBC Latest Ref Rng & Units 08/26/2020  WBC 4.0 - 10.5 K/uL 7.4  Hemoglobin 12.0 - 15.0 g/dL 9.3(L)  Hematocrit 36.0 - 46.0 % 30.3(L)  Platelets 150 - 400 K/uL 304    No images are attached to the encounter.  CT CHEST WO CONTRAST  Result Date: 08/06/2020 CLINICAL DATA:  Colorectal cancer staging in a 85 year old female EXAM: CT CHEST WITHOUT CONTRAST TECHNIQUE: Multidetector CT imaging of the chest was performed following the standard protocol without IV contrast. COMPARISON:  June 24, 2020 abdomen and pelvis CT. FINDINGS: Cardiovascular: Calcified atheromatous plaque of the thoracic aorta. Median sternotomy for coronary revascularization. Dilated  ascending thoracic aorta, mildly dilated to 3.7 cm. Heart size normal. Calcifications in native coronary vasculature. Central pulmonary vessels with normal caliber. Limited assessment of cardiovascular structures given lack of intravenous contrast. Mediastinum/Nodes: Esophagus patulous with some debris in the lumen. No thoracic inlet  lymphadenopathy. No axillary lymphadenopathy. No mediastinal lymphadenopathy. Lungs/Pleura: No effusion. No consolidation. Subpleural reticulation at the lung bases. Small nodule at the RIGHT lung base measures 12 x 5 mm. (Image 136/2) Biapical pleural and parenchymal scarring with some bronchiectasis at the lung apices showing mild asymmetry RIGHT slightly greater than LEFT. Upper Abdomen: Incidental imaging of upper abdominal contents without acute process. Stable peripherally calcified splenic artery aneurysm at approximately 14 mm greatest size. Imaged portions of pancreas, spleen and adrenal glands are normal. Partially imaged large LEFT renal cyst. Musculoskeletal: Spinal degenerative changes. Post median sternotomy. No acute or destructive bone process. IMPRESSION: 1. Small nodule at the RIGHT lung base measures 12 x 5 mm. Not changed compared to very recent imaging. Findings are nonspecific given that it is unifocal, neoplasm or post inflammatory changes are considered. More remote priors for comparison do not show this abnormality. Consider short interval follow-up or PET imaging for further assessment as warranted. 2. Signs of subpleural reticulation at the lung bases, also with apical septal thickening and pleural and parenchymal scarring. Correlate with any respiratory symptoms. Findings could be related to interstitial disease. Findings at the lung bases could also be due to aspiration given patulous debris-filled esophagus. Follow-up with high-resolution chest CT as warranted. 3. Stable peripherally calcified splenic artery aneurysm at approximately 14 mm greatest size. 4.  Dilated ascending thoracic aorta, mildly dilated to 3.7 cm. Aortic Atherosclerosis (ICD10-I70.0). Electronically Signed   By: Zetta Bills M.D.   On: 08/06/2020 11:29     Assessment and plan- Patient is a 85 y.o. female with stage IIIc adenocarcinoma of the colon.  She is currently on Xeloda and this is a routine follow-up visit  Patient has been on Xeloda for about 3 days now and so far tolerating it well without any significant side effects.  We are giving her at a reduced dose of 830 mg per metered square which would allow her to take 3 500 mg tablets in the morning and 2 500 mg tablets in the evening to reduce her pill burden.  Plan is to take this 2 weeks on 1 week off for 8 cycles.  I will tentatively see her on 09/16/2020 when she starts her second cycle of Xeloda.  Urea ointment has also been prescribed to apply on bilateral hands and feet to prevent hand-foot rash that can be seen with Xeloda.  Patient's family including her husband and her daughter were present at the time of my visit today and they all had multiple insightful questions which were answered to their satisfaction   Visit Diagnosis 1. Cecal cancer ([place])   2. High risk medication use      Dr. Randa Evens, MD, MPH [facility] [phone] 08/29/2020 1:01 PM

## 2020-09-09 ENCOUNTER — Other Ambulatory Visit (HOSPITAL_COMMUNITY): Payer: Self-pay

## 2020-09-11 ENCOUNTER — Other Ambulatory Visit: Payer: Self-pay | Admitting: Oncology

## 2020-09-11 ENCOUNTER — Other Ambulatory Visit (HOSPITAL_COMMUNITY): Payer: Self-pay

## 2020-09-11 ENCOUNTER — Inpatient Hospital Stay: Payer: Medicare PPO | Attending: Oncology

## 2020-09-11 DIAGNOSIS — E538 Deficiency of other specified B group vitamins: Secondary | ICD-10-CM | POA: Insufficient documentation

## 2020-09-11 DIAGNOSIS — C180 Malignant neoplasm of cecum: Secondary | ICD-10-CM | POA: Insufficient documentation

## 2020-09-11 DIAGNOSIS — Z8543 Personal history of malignant neoplasm of ovary: Secondary | ICD-10-CM | POA: Insufficient documentation

## 2020-09-11 DIAGNOSIS — I10 Essential (primary) hypertension: Secondary | ICD-10-CM | POA: Insufficient documentation

## 2020-09-11 DIAGNOSIS — D561 Beta thalassemia: Secondary | ICD-10-CM | POA: Insufficient documentation

## 2020-09-11 DIAGNOSIS — D509 Iron deficiency anemia, unspecified: Secondary | ICD-10-CM | POA: Insufficient documentation

## 2020-09-11 NOTE — Progress Notes (Signed)
Nutrition Follow-up:   Patient with stage III cecal cancer s/p open colectomy.  Patient taking xeloda.    Spoke with patient via phone for nutrition follow-up.  Patient reports appetite is better and eating more.  Had sausage, eggs, grits, toast and juice for breakfast this am. Caregiver has been preparing meals for her ([place] meatloaf, cooked vegetables).  Has been drinking ensure complete about 2 a day.  Denies nausea, diarrhea or other nutrition impact symptoms.      Medications: reviewed  Labs: reviewed  Anthropometrics:   Weight 106 lb on 5/25, stable  106 lb on 5/5 107 lb on 4/21 UBW in 120s   NUTRITION DIAGNOSIS: Inadequate oral intake improving    INTERVENTION:  Patient to continue eating high calorie, high protein foods Recommend ensure complete 2-3 times per day.  Patient can order via [facility] or [facility] if unable to find in store.       MONITORING, EVALUATION, GOAL: weight trends, intake   NEXT VISIT: Phone call Friday, July 22  Phyllis Ross B. Zenia Resides, [place], [place] Registered Dietitian [phone] (mobile)

## 2020-09-14 ENCOUNTER — Other Ambulatory Visit: Payer: Self-pay | Admitting: Oncology

## 2020-09-14 ENCOUNTER — Other Ambulatory Visit (HOSPITAL_COMMUNITY): Payer: Self-pay

## 2020-09-14 DIAGNOSIS — C180 Malignant neoplasm of cecum: Secondary | ICD-10-CM

## 2020-09-14 MED ORDER — CAPECITABINE 500 MG PO TABS
ORAL_TABLET | ORAL | 0 refills | Status: DC
  Filled 2020-09-14: qty 70, 21d supply, fill #0

## 2020-09-15 ENCOUNTER — Other Ambulatory Visit (HOSPITAL_COMMUNITY): Payer: Self-pay

## 2020-09-15 ENCOUNTER — Telehealth: Payer: Self-pay

## 2020-09-15 ENCOUNTER — Other Ambulatory Visit: Payer: Self-pay | Admitting: *Deleted

## 2020-09-15 DIAGNOSIS — C180 Malignant neoplasm of cecum: Secondary | ICD-10-CM

## 2020-09-15 NOTE — Telephone Encounter (Signed)
[name] called requesting a refill for megestrol 40 mg tablet. Written on 07/02/2020 (60 tablet with 1 refill). Please advise.

## 2020-09-16 ENCOUNTER — Other Ambulatory Visit: Payer: Self-pay | Admitting: *Deleted

## 2020-09-16 ENCOUNTER — Inpatient Hospital Stay (HOSPITAL_BASED_OUTPATIENT_CLINIC_OR_DEPARTMENT_OTHER): Payer: Medicare PPO | Admitting: Oncology

## 2020-09-16 ENCOUNTER — Inpatient Hospital Stay: Payer: Medicare PPO

## 2020-09-16 ENCOUNTER — Other Ambulatory Visit: Payer: Self-pay

## 2020-09-16 ENCOUNTER — Encounter: Payer: Self-pay | Admitting: Oncology

## 2020-09-16 VITALS — BP 74/46 | HR 83 | Temp 97.4°F | Resp 14 | Wt 103.5 lb

## 2020-09-16 DIAGNOSIS — E538 Deficiency of other specified B group vitamins: Secondary | ICD-10-CM | POA: Diagnosis not present

## 2020-09-16 DIAGNOSIS — C180 Malignant neoplasm of cecum: Secondary | ICD-10-CM

## 2020-09-16 DIAGNOSIS — G4701 Insomnia due to medical condition: Secondary | ICD-10-CM

## 2020-09-16 DIAGNOSIS — I10 Essential (primary) hypertension: Secondary | ICD-10-CM | POA: Diagnosis not present

## 2020-09-16 DIAGNOSIS — D509 Iron deficiency anemia, unspecified: Secondary | ICD-10-CM

## 2020-09-16 DIAGNOSIS — Z79899 Other long term (current) drug therapy: Secondary | ICD-10-CM

## 2020-09-16 DIAGNOSIS — Z8543 Personal history of malignant neoplasm of ovary: Secondary | ICD-10-CM | POA: Diagnosis not present

## 2020-09-16 DIAGNOSIS — D561 Beta thalassemia: Secondary | ICD-10-CM | POA: Diagnosis not present

## 2020-09-16 LAB — CBC WITH DIFFERENTIAL/PLATELET
Abs Immature Granulocytes: 0.02 10*3/uL (ref 0.00–0.07)
Basophils Absolute: 0 10*3/uL (ref 0.0–0.1)
Basophils Relative: 0 %
Eosinophils Absolute: 0 10*3/uL (ref 0.0–0.5)
Eosinophils Relative: 1 %
HCT: 26.7 % — ABNORMAL LOW (ref 36.0–46.0)
Hemoglobin: 8.4 g/dL — ABNORMAL LOW (ref 12.0–15.0)
Immature Granulocytes: 0 %
Lymphocytes Relative: 22 %
Lymphs Abs: 1.7 10*3/uL (ref 0.7–4.0)
MCH: 20.6 pg — ABNORMAL LOW (ref 26.0–34.0)
MCHC: 31.5 g/dL (ref 30.0–36.0)
MCV: 65.4 fL — ABNORMAL LOW (ref 80.0–100.0)
Monocytes Absolute: 0.6 10*3/uL (ref 0.1–1.0)
Monocytes Relative: 8 %
Neutro Abs: 5.5 10*3/uL (ref 1.7–7.7)
Neutrophils Relative %: 69 %
Platelets: 292 10*3/uL (ref 150–400)
RBC: 4.08 MIL/uL (ref 3.87–5.11)
RDW: 20.7 % — ABNORMAL HIGH (ref 11.5–15.5)
WBC: 7.9 10*3/uL (ref 4.0–10.5)
nRBC: 0 % (ref 0.0–0.2)

## 2020-09-16 LAB — COMPREHENSIVE METABOLIC PANEL
ALT: 20 U/L (ref 0–44)
AST: 25 U/L (ref 15–41)
Albumin: 3.5 g/dL (ref 3.5–5.0)
Alkaline Phosphatase: 49 U/L (ref 38–126)
Anion gap: 9 (ref 5–15)
BUN: 19 mg/dL (ref 8–23)
CO2: 24 mmol/L (ref 22–32)
Calcium: 9 mg/dL (ref 8.9–10.3)
Chloride: 104 mmol/L (ref 98–111)
Creatinine, Ser: 0.96 mg/dL (ref 0.44–1.00)
GFR, Estimated: 59 mL/min — ABNORMAL LOW (ref >60)
Glucose, Bld: 123 mg/dL — ABNORMAL HIGH (ref 70–99)
Potassium: 3 mmol/L — ABNORMAL LOW (ref 3.5–5.1)
Sodium: 137 mmol/L (ref 135–145)
Total Bilirubin: 1.8 mg/dL — ABNORMAL HIGH (ref 0.3–1.2)
Total Protein: 7.4 g/dL (ref 6.5–8.1)

## 2020-09-16 MED ORDER — MEGESTROL ACETATE 40 MG PO TABS: 40.0000 mg | ORAL_TABLET | Freq: Two times a day (BID) | ORAL | 1 refills | Status: DC

## 2020-09-16 MED ORDER — CYANOCOBALAMIN 1000 MCG/ML IJ SOLN
1000.0000 ug | INTRAMUSCULAR | Status: DC
  Administered 2020-09-16: 1000 ug via INTRAMUSCULAR

## 2020-09-16 MED ORDER — CAPECITABINE 500 MG PO TABS: ORAL_TABLET | ORAL | 5 refills | Status: DC

## 2020-09-16 MED ORDER — TRAZODONE HCL 50 MG PO TABS: 25.0000 mg | ORAL_TABLET | Freq: Every evening | ORAL | 5 refills | Status: DC | PRN

## 2020-09-16 NOTE — Progress Notes (Signed)
mm    Hematology/Oncology Consult note [facility]  [phone] [phone]  Patient Care Team: Juline Patch, MD as PCP - General (Family Medicine) Corey Skains, MD as Consulting Physician (Cardiology) Clent Jacks, RN as Oncology Nurse Navigator Ronny Bacon, MD as Consulting Physician (General Surgery) Sindy Guadeloupe, MD as Consulting Physician (Oncology)   Name of the patient: Phyllis Ross  [id number]  10-30-1935   Date of visit: 09/16/20  Diagnosis: stage IIIc colon cancer s/p hemicolectomy  Chief complaint/ Reason for visit-routine follow-up of colon cancer on Xeloda  Heme/Onc history: Patient is a 85 year old female who was 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  She is s/p laparoscopic right hemicolectomy.  Pathology showed 5.2 x 4.3 x 2.7 cm grade 2 moderately differentiated adenocarcinoma with invasion through muscularis propria into pericolorectal tissue.  7 of 27 lymph nodes positive.  Lymphovascular invasion present but no perineural invasion.  MMR negative.  [id number].  Right hemicolectomy complicated by anastomotic breakdown/leak which was treated with antibiotics.  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  Also has a history of baseline microcytic anemia secondary to beta thalassemia and baseline hemoglobin is between 10-11.   Patient has met with Dr. Mike Gip in the past management chemotherapy options including oral Xeloda has been offered to the patient   Patient started adjuvant Xeloda on 08/23/2020  Interval history-patient reports that her appetite is improving and she is eating better although she has still lost a couple of pounds as compared to last visit.  States it may be due to heavier shoes that she was wearing last time  ECOG PS- 1-2 Pain scale-  0  Review of systems- Review of Systems  Constitutional:  Positive for malaise/fatigue and weight loss. Negative for chills and fever.  HENT:  Negative for congestion, ear discharge and nosebleeds.   Eyes:  Negative for blurred vision.  Respiratory:  Negative for cough, hemoptysis, sputum production, shortness of breath and wheezing.   Cardiovascular:  Negative for chest pain, palpitations, orthopnea and claudication.  Gastrointestinal:  Negative for abdominal pain, blood in stool, constipation, diarrhea, heartburn, melena, nausea and vomiting.  Genitourinary:  Negative for dysuria, flank pain, frequency, hematuria and urgency.  Musculoskeletal:  Negative for back pain, joint pain and myalgias.  Skin:  Negative for rash.  Neurological:  Negative for dizziness, tingling, focal weakness, seizures, weakness and headaches.  Endo/Heme/Allergies:  Does not bruise/bleed easily.  Psychiatric/Behavioral:  Negative for depression and suicidal ideas. The patient does not have insomnia.       No Known Allergies   Past Medical History:  Diagnosis Date   Anemia    Anxiety    Cancer ([place])    Hyperlipidemia    Hypertension    MI (myocardial infarction) ([place]) 2008   triple bypass   Osteoporosis    Pneumonia    as a baby   Rheumatic fever      Past Surgical History:  Procedure Laterality Date   COLONOSCOPY  2011   normal- Dr Vira Agar   COLONOSCOPY WITH PROPOFOL N/A 06/04/2020   Procedure: COLONOSCOPY WITH PROPOFOL;  Surgeon: Lucilla Lame, MD;  Location: [facility];  Service: Endoscopy;  Laterality: N/A;   CORONARY ARTERY BYPASS GRAFT     ESOPHAGOGASTRODUODENOSCOPY (EGD) WITH PROPOFOL N/A 06/04/2020   Procedure: ESOPHAGOGASTRODUODENOSCOPY (EGD) WITH PROPOFOL;  Surgeon: Lucilla Lame,  MD;  Location: [facility];  Service: Endoscopy;  Laterality: N/A;   VAGINAL HYSTERECTOMY     DUB    Social History   Socioeconomic History   Marital status: Married    Spouse name: Not on  file   Number of children: 2   Years of education: Not on file   Highest education level: Some college, no degree  Occupational History    Employer: RETIRED  Tobacco Use   Smoking status: Never   Smokeless tobacco: Never  Vaping Use   Vaping Use: Never used  Substance and Sexual Activity   Alcohol use: No   Drug use: No   Sexual activity: Yes  Other Topics Concern   Not on file  Social History Narrative   Not on file   Social Determinants of Health   Financial Resource Strain: Low Risk    Difficulty of Paying Living Expenses: Not hard at all  Food Insecurity: No Food Insecurity   Worried About Charity fundraiser in the Last Year: Never true   [place] in the Last Year: Never true  Transportation Needs: No Transportation Needs   Lack of Transportation (Medical): No   Lack of Transportation (Non-Medical): No  Physical Activity: Sufficiently Active   Days of Exercise per Week: 6 days   Minutes of Exercise per Session: 40 min  Stress: No Stress Concern Present   Feeling of Stress : Not at all  Social Connections: Socially Integrated   Frequency of Communication with Friends and Family: More than three times a week   Frequency of Social Gatherings with Friends and Family: More than three times a week   Attends Religious Services: More than 4 times per year   Active Member of [employer] or Organizations: Yes   Attends Music therapist: More than 4 times per year   Marital Status: Married  Human resources officer Violence: Not At Risk   Fear of Current or Ex-Partner: No   Emotionally Abused: No   Physically Abused: No   Sexually Abused: No    Family History  Problem Relation Age of Onset   Heart attack Mother    Heart attack Maternal Uncle    Heart attack Maternal Uncle      Current Outpatient Medications:    atorvastatin (LIPITOR) 80 MG tablet, 1 tablet daily (Patient taking differently: Take 80 mg by mouth daily.), Disp: 90 tablet, Rfl: 1   ferrous  sulfate 325 (65 FE) MG tablet, Take 325 mg by mouth daily with breakfast., Disp: , Rfl:    lisinopril (ZESTRIL) 10 MG tablet, TAKE (1) TABLET BY MOUTH TWICE DAILY (Patient taking differently: Take 10 mg by mouth 2 (two) times daily.), Disp: 180 tablet, Rfl: 1   ALPRAZolam (XANAX) 0.25 MG tablet, Take 1 tablet (0.25 mg total) by mouth daily as needed for anxiety. (Patient not taking: No sig reported), Disp: 30 tablet, Rfl: 5   capecitabine (XELODA) 500 MG tablet, Take 3 tablets (1586m) by mouth in AM and 2 tablets (10066m in PM. Take with food. Take for 14 days, then hold for 7 days. Repeat every 21 days., Disp: 70 tablet, Rfl: 5   HYDROcodone-acetaminophen (NORCO/VICODIN) 5-325 MG tablet, Take 1 tablet by mouth every 6 (six) hours as needed for severe pain (breakthrough pain). (Patient not taking: No sig reported), Disp: 15 tablet, Rfl: 0   loperamide (IMODIUM) 2 MG capsule, Take 2 mg by mouth as needed for diarrhea or loose stools. (Patient not taking: No  sig reported), Disp: , Rfl:    megestrol (MEGACE) 40 MG tablet, Take 1 tablet (40 mg total) by mouth 2 (two) times daily., Disp: 60 tablet, Rfl: 1   ondansetron (ZOFRAN ODT) 4 MG disintegrating tablet, Take 1 tablet (4 mg total) by mouth every 8 (eight) hours as needed for nausea or vomiting. (Patient not taking: No sig reported), Disp: 30 tablet, Rfl: 0   Sodium Sulfate-Mag Sulfate-KCl (SUTAB) [phone] MG TABS, Take 376 mg by mouth as directed. (Patient not taking: No sig reported), Disp: 24 tablet, Rfl: 0   traZODone (DESYREL) 50 MG tablet, Take 0.5-1 tablets (25-50 mg total) by mouth at bedtime as needed for sleep., Disp: 30 tablet, Rfl: 5   Urea 10 % OINT, Apply 1 Dose topically in the morning and at bedtime. To hands and feet from side effects of xeloda (Patient not taking: Reported on 09/16/2020), Disp: 454 g, Rfl: 0 No current facility-administered medications for this visit.  Facility-Administered Medications Ordered in Other Visits:     cyanocobalamin ((VITAMIN B-12)) injection 1,000 mcg, 1,000 mcg, Intramuscular, Q21 days, Sindy Guadeloupe, MD, 1,000 mcg at 09/16/20 1547  Physical exam:  Vitals:   09/16/20 1500  BP: (!) 74/46  Pulse: 83  Resp: 14  Temp: (!) 97.4 F (36.3 C)  SpO2: 97%  Weight: 103 lb 8.1 oz (47 kg)   Physical Exam Constitutional:      Comments: Elderly woman sitting in a wheelchair. Appears in no acute distress  Cardiovascular:     Rate and Rhythm: Normal rate and regular rhythm.     Heart sounds: Normal heart sounds.  Pulmonary:     Effort: Pulmonary effort is normal.     Breath sounds: Normal breath sounds.  Abdominal:     General: Bowel sounds are normal.     Palpations: Abdomen is soft.  Skin:    General: Skin is warm and dry.  Neurological:     Mental Status: She is alert and oriented to person, place, and time.     CMP Latest Ref Rng & Units 09/16/2020  Glucose 70 - 99 mg/dL 123(H)  BUN 8 - 23 mg/dL 19  Creatinine 0.44 - 1.00 mg/dL 0.96  Sodium 135 - 145 mmol/L 137  Potassium 3.5 - 5.1 mmol/L 3.0(L)  Chloride 98 - 111 mmol/L 104  CO2 22 - 32 mmol/L 24  Calcium 8.9 - 10.3 mg/dL 9.0  Total Protein 6.5 - 8.1 g/dL 7.4  Total Bilirubin 0.3 - 1.2 mg/dL 1.8(H)  Alkaline Phos 38 - 126 U/L 49  AST 15 - 41 U/L 25  ALT 0 - 44 U/L 20   CBC Latest Ref Rng & Units 09/16/2020  WBC 4.0 - 10.5 K/uL 7.9  Hemoglobin 12.0 - 15.0 g/dL 8.4(L)  Hematocrit 36.0 - 46.0 % 26.7(L)  Platelets 150 - 400 K/uL 292   Assessment and plan- Patient is a 85 y.o. female with stage IIIc adenocarcinoma of the colon.  She is currently on Xeloda and this is a routine follow-up visit  Patient has been on Xeloda for 1 cycle now and tolerated well without any significant nausea or vomiting diarrhea or skin rash.  I have renewed the prescription for the next cycle today as well.  She continues to have ongoing anemia and her hemoglobin is 8.4.  Baseline microcytosis is likely secondary to beta thalassemia and her  hemoglobin is typically between 10-11 in the past.  Suspect anemia is also secondary to chronic disease in the setting of recent  surgery and postoperative recovery potentially worsened by Xeloda     Visit Diagnosis 1. High risk medication use   2. Cecal cancer ([place])   3. B12 deficiency   4. Microcytic anemia      Dr. Randa Evens, MD, MPH [facility] [phone] 09/16/2020 5:02 PM

## 2020-09-16 NOTE — Progress Notes (Signed)
Daughter states every time Xeloda needs to be picked up from pharmacy there is always an issue of not being ready or refills being available. Needs a refill on trazadone and megastrol.

## 2020-09-18 ENCOUNTER — Other Ambulatory Visit (HOSPITAL_COMMUNITY): Payer: Self-pay

## 2020-09-18 ENCOUNTER — Other Ambulatory Visit: Payer: Self-pay | Admitting: Pharmacist

## 2020-09-18 DIAGNOSIS — C180 Malignant neoplasm of cecum: Secondary | ICD-10-CM

## 2020-09-18 MED ORDER — CAPECITABINE 500 MG PO TABS
ORAL_TABLET | ORAL | 5 refills | Status: DC
  Filled 2020-09-18: qty 70, fill #0
  Filled 2020-09-28 – 2020-10-02 (×2): qty 70, 21d supply, fill #0

## 2020-09-20 ENCOUNTER — Encounter: Payer: Self-pay | Admitting: Oncology

## 2020-09-21 ENCOUNTER — Telehealth: Payer: Self-pay

## 2020-09-21 ENCOUNTER — Other Ambulatory Visit: Payer: Self-pay

## 2020-09-21 ENCOUNTER — Other Ambulatory Visit (HOSPITAL_COMMUNITY): Payer: Self-pay

## 2020-09-21 DIAGNOSIS — K219 Gastro-esophageal reflux disease without esophagitis: Secondary | ICD-10-CM

## 2020-09-21 MED ORDER — PANTOPRAZOLE SODIUM 40 MG PO TBEC: 40.0000 mg | DELAYED_RELEASE_TABLET | Freq: Every day | ORAL | 2 refills | Status: DC

## 2020-09-21 NOTE — Telephone Encounter (Unsigned)
Copied from [place] [id number]. Topic: General - Other >> Sep 21, 2020 12:43 PM Alanda Slim E wrote: Reason for CRM:Pt stated she is having some issues with heart burn/ Pt asked for Baxter Flattery to please give her a call when she gets a moment / please advise

## 2020-09-22 ENCOUNTER — Other Ambulatory Visit: Payer: Self-pay

## 2020-09-22 ENCOUNTER — Other Ambulatory Visit (HOSPITAL_COMMUNITY): Payer: Self-pay

## 2020-09-22 NOTE — Telephone Encounter (Signed)
Pt called in stated she had spoken to Baxter Flattery this morning, and has forgotten the information that she needed to give to Baxter Flattery, pt request Baxter Flattery reach out to her. Please advise

## 2020-09-22 NOTE — Progress Notes (Signed)
Stopped lisinopril due to low bp readings.

## 2020-09-28 ENCOUNTER — Encounter: Payer: Self-pay | Admitting: Oncology

## 2020-09-28 ENCOUNTER — Other Ambulatory Visit (HOSPITAL_COMMUNITY): Payer: Self-pay

## 2020-09-29 ENCOUNTER — Other Ambulatory Visit: Payer: Self-pay | Admitting: Hospice and Palliative Medicine

## 2020-09-29 DIAGNOSIS — R531 Weakness: Secondary | ICD-10-CM

## 2020-09-29 NOTE — Telephone Encounter (Signed)
I spoke with [facility]. They are unable to take patient but coordinated with [facility] who will see her for home health.

## 2020-09-29 NOTE — Telephone Encounter (Signed)
Can you please see if we can get home PT for her?

## 2020-09-29 NOTE — Progress Notes (Signed)
Order for home health PT/OT

## 2020-09-30 ENCOUNTER — Other Ambulatory Visit (HOSPITAL_COMMUNITY): Payer: Self-pay

## 2020-10-02 ENCOUNTER — Other Ambulatory Visit (HOSPITAL_COMMUNITY): Payer: Self-pay

## 2020-10-06 ENCOUNTER — Other Ambulatory Visit: Payer: Self-pay

## 2020-10-06 ENCOUNTER — Ambulatory Visit: Payer: Medicare PPO | Admitting: Family Medicine

## 2020-10-06 ENCOUNTER — Encounter: Payer: Self-pay | Admitting: Family Medicine

## 2020-10-06 VITALS — BP 120/60 | HR 78 | Ht 65.0 in | Wt 102.0 lb

## 2020-10-06 DIAGNOSIS — E7849 Other hyperlipidemia: Secondary | ICD-10-CM | POA: Diagnosis not present

## 2020-10-06 DIAGNOSIS — K219 Gastro-esophageal reflux disease without esophagitis: Secondary | ICD-10-CM

## 2020-10-06 MED ORDER — ATORVASTATIN CALCIUM 80 MG PO TABS: ORAL_TABLET | ORAL | 1 refills | Status: DC

## 2020-10-06 MED ORDER — PANTOPRAZOLE SODIUM 40 MG PO TBEC
40.0000 mg | DELAYED_RELEASE_TABLET | Freq: Every day | ORAL | 1 refills | Status: DC
Start: 2020-10-06 — End: 2020-10-12

## 2020-10-06 NOTE — Progress Notes (Signed)
Date:  10/06/2020   Name:  Phyllis Ross   DOB:  [birth year]   MRN:  [id number]   Chief Complaint: Gastroesophageal Reflux and Hyperlipidemia  Gastroesophageal Reflux She reports no abdominal pain, no belching, no chest pain, no choking, no coughing, no dysphagia, no early satiety, no globus sensation, no heartburn, no hoarse voice, no nausea, no sore throat, no stridor or no wheezing. This is a chronic problem. The current episode started more than 1 year ago. The problem has been gradually worsening. Pertinent negatives include no anemia, fatigue, melena, muscle weakness, orthopnea or weight loss. She has tried a PPI for the symptoms. The treatment provided moderate relief.  Hyperlipidemia This is a chronic problem. The current episode started more than 1 year ago. Recent lipid tests were reviewed and are normal. She has no history of chronic renal disease, diabetes, hypothyroidism, liver disease, obesity or nephrotic syndrome. Pertinent negatives include no chest pain, focal sensory loss, focal weakness, leg pain, myalgias or shortness of breath. Current antihyperlipidemic treatment includes statins. The current treatment provides moderate improvement of lipids. There are no compliance problems.  Risk factors for coronary artery disease include dyslipidemia and hypertension.   Lab Results  Component Value Date   CREATININE 0.96 09/16/2020   BUN 19 09/16/2020   NA 137 09/16/2020   K 3.0 (L) 09/16/2020   CL 104 09/16/2020   CO2 24 09/16/2020   Lab Results  Component Value Date   CHOL 152 01/29/2020   HDL 51 01/29/2020   LDLCALC 72 01/29/2020   TRIG 173 (A) 01/29/2020   CHOLHDL 2.9 12/12/2017   No results found for: TSH Lab Results  Component Value Date   HGBA1C 5.5 04/07/2020   Lab Results  Component Value Date   WBC 7.9 09/16/2020   HGB 8.4 (L) 09/16/2020   HCT 26.7 (L) 09/16/2020   MCV 65.4 (L) 09/16/2020   PLT 292 09/16/2020   Lab Results  Component Value Date    ALT 20 09/16/2020   AST 25 09/16/2020   ALKPHOS 49 09/16/2020   BILITOT 1.8 (H) 09/16/2020     Review of Systems  Constitutional:  Negative for chills, fatigue, fever and weight loss.  HENT:  Negative for drooling, ear discharge, ear pain, hoarse voice and sore throat.   Respiratory:  Negative for cough, choking, shortness of breath and wheezing.   Cardiovascular:  Negative for chest pain, palpitations and leg swelling.  Gastrointestinal:  Negative for abdominal pain, blood in stool, constipation, diarrhea, dysphagia, heartburn, melena and nausea.  Endocrine: Negative for polydipsia.  Genitourinary:  Negative for dysuria, frequency, hematuria and urgency.  Musculoskeletal:  Negative for back pain, myalgias, muscle weakness and neck pain.  Skin:  Negative for rash.  Allergic/Immunologic: Negative for environmental allergies.  Neurological:  Negative for dizziness, focal weakness and headaches.  Hematological:  Does not bruise/bleed easily.  Psychiatric/Behavioral:  Negative for suicidal ideas. The patient is not nervous/anxious.    Patient Active Problem List   Diagnosis Date Noted   B12 deficiency 09/16/2020   Goals of care, counseling/discussion 08/07/2020   Microcytic anemia 07/23/2020   Status post partial colectomy 07/02/2020   Protein-calorie malnutrition, severe 06/24/2020   Cecal cancer ([place]) 06/15/2020   Iron deficiency anemia due to chronic blood loss    Neoplasm of lower gastrointestinal tract    Acute gastritis with hemorrhage    Personal history of other malignant neoplasm of skin 01/21/2019   Atherosclerosis of abdominal aorta ([place]) 12/26/2017   Age-related  osteoporosis without current pathological fracture 06/16/2016   Encounter for follow-up surveillance of ovarian cancer 05/20/2015   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   Wedging of vertebra ([place]) 10/13/2014   Polypharmacy 10/13/2014   Encounter for general adult medical examination  without abnormal findings 10/13/2014   Anxiety 10/13/2014   Essential hypertension 08/27/2014   Bradycardia 08/27/2014   Coronary artery disease of native heart with stable angina pectoris ([place]) 08/27/2014   MI (mitral incompetence) 08/27/2014   TI (tricuspid incompetence) 08/27/2014   Combined fat and carbohydrate induced hyperlipemia 08/13/2014   HTN (hypertension), malignant 08/11/2014   Chest pain 08/11/2014   CAD (coronary artery disease) of artery bypass graft 08/11/2014    Allergies  Allergen Reactions   Trazodone And Nefazodone Other (See Comments)    Made her pass out    Past Surgical History:  Procedure Laterality Date   COLONOSCOPY  2011   normal- Dr Vira Agar   COLONOSCOPY WITH PROPOFOL N/A 06/04/2020   Procedure: COLONOSCOPY WITH PROPOFOL;  Surgeon: Lucilla Lame, MD;  Location: [facility];  Service: Endoscopy;  Laterality: N/A;   CORONARY ARTERY BYPASS GRAFT     ESOPHAGOGASTRODUODENOSCOPY (EGD) WITH PROPOFOL N/A 06/04/2020   Procedure: ESOPHAGOGASTRODUODENOSCOPY (EGD) WITH PROPOFOL;  Surgeon: Lucilla Lame, MD;  Location: [facility];  Service: Endoscopy;  Laterality: N/A;   VAGINAL HYSTERECTOMY     DUB    Social History   Tobacco Use   Smoking status: Never   Smokeless tobacco: Never  Vaping Use   Vaping Use: Never used  Substance Use Topics   Alcohol use: No   Drug use: No     Medication list has been reviewed and updated.  Current Meds  Medication Sig   atorvastatin (LIPITOR) 80 MG tablet 1 tablet daily (Patient taking differently: Take 80 mg by mouth daily.)   capecitabine (XELODA) 500 MG tablet Take 3 tablets (1500mg ) by mouth in AM and 2 tablets (1000mg ) in PM. Take with food. Take for 14 days, then hold for 7 days. Repeat every 21 days.   ferrous sulfate 325 (65 FE) MG tablet Take 325 mg by mouth daily with breakfast.   megestrol (MEGACE) 40 MG tablet Take 1 tablet (40 mg total) by mouth 2 (two) times daily.   pantoprazole (PROTONIX)  40 MG tablet Take 1 tablet (40 mg total) by mouth daily.    PHQ 2/9 Scores 07/20/2020 02/03/2020 09/24/2019 03/21/2019  PHQ - 2 Score 0 0 0 0  PHQ- 9 Score 5 - 0 0    GAD 7 : Generalized Anxiety Score 07/20/2020 09/24/2019 03/21/2019  Nervous, Anxious, on Edge 2 0 0  Control/stop worrying 2 0 0  Worry too much - different things 2 0 0  Trouble relaxing 0 0 0  Restless 0 0 0  Easily annoyed or irritable 1 0 0  Afraid - awful might happen 1 0 0  Total GAD 7 Score 8 0 0  Anxiety Difficulty Not difficult at all - -    BP Readings from Last 3 Encounters:  09/16/20 (!) 74/46  08/26/20 114/62  08/06/20 102/63    Physical Exam Vitals and nursing note reviewed.  Constitutional:      General: She is not in acute distress.    Appearance: She is not diaphoretic.  HENT:     Head: Normocephalic and atraumatic.     Right Ear: External ear normal.     Left Ear: External ear normal.     Nose: Nose normal.  Eyes:  General:        Right eye: No discharge.        Left eye: No discharge.     Conjunctiva/sclera: Conjunctivae normal.     Pupils: Pupils are equal, round, and reactive to light.  Neck:     Thyroid: No thyromegaly.     Vascular: No JVD.  Cardiovascular:     Rate and Rhythm: Normal rate and regular rhythm.     Heart sounds: Normal heart sounds. No murmur heard.   No friction rub. No gallop.  Pulmonary:     Effort: Pulmonary effort is normal.     Breath sounds: Normal breath sounds. No wheezing or rhonchi.  Abdominal:     General: Bowel sounds are normal.     Palpations: Abdomen is soft. There is no mass.     Tenderness: There is no abdominal tenderness. There is no guarding.  Musculoskeletal:        General: Normal range of motion.     Cervical back: Normal range of motion and neck supple.  Lymphadenopathy:     Cervical: No cervical adenopathy.  Skin:    General: Skin is warm and dry.  Neurological:     Mental Status: She is alert.     Deep Tendon Reflexes:  Reflexes are normal and symmetric.    Wt Readings from Last 3 Encounters:  10/06/20 102 lb (46.3 kg)  09/16/20 103 lb 8.1 oz (47 kg)  08/26/20 106 lb 0.7 oz (48.1 kg)    Ht 5\' 5"  (1.651 m)   Wt 102 lb (46.3 kg)   BMI 16.97 kg/m   Assessment and Plan:  1. Familial multiple lipoprotein-type hyperlipidemia Chronic.  Controlled.  Stable.  Continue atorvastatin 80 mg once a day. - atorvastatin (LIPITOR) 80 MG tablet; 1 tablet daily  Dispense: 90 tablet; Refill: 1  2. Gastroesophageal reflux disease without esophagitis Chronic.  Controlled.  Stable.  Continue pantoprazole 40 mg once a day. - pantoprazole (PROTONIX) 40 MG tablet; Take 1 tablet (40 mg total) by mouth daily.  Dispense: 90 tablet; Refill: 1

## 2020-10-07 ENCOUNTER — Other Ambulatory Visit (HOSPITAL_COMMUNITY): Payer: Self-pay

## 2020-10-07 ENCOUNTER — Other Ambulatory Visit: Payer: Self-pay

## 2020-10-07 ENCOUNTER — Encounter: Payer: Self-pay | Admitting: Oncology

## 2020-10-07 ENCOUNTER — Inpatient Hospital Stay (HOSPITAL_BASED_OUTPATIENT_CLINIC_OR_DEPARTMENT_OTHER): Payer: Medicare PPO | Admitting: Oncology

## 2020-10-07 ENCOUNTER — Inpatient Hospital Stay: Payer: Medicare PPO

## 2020-10-07 ENCOUNTER — Inpatient Hospital Stay: Payer: Medicare PPO | Attending: Oncology

## 2020-10-07 VITALS — BP 111/68 | HR 75 | Temp 97.2°F | Wt 102.4 lb

## 2020-10-07 DIAGNOSIS — D561 Beta thalassemia: Secondary | ICD-10-CM | POA: Diagnosis not present

## 2020-10-07 DIAGNOSIS — Z8543 Personal history of malignant neoplasm of ovary: Secondary | ICD-10-CM | POA: Diagnosis not present

## 2020-10-07 DIAGNOSIS — E789 Disorder of lipoprotein metabolism, unspecified: Secondary | ICD-10-CM

## 2020-10-07 DIAGNOSIS — E876 Hypokalemia: Secondary | ICD-10-CM | POA: Insufficient documentation

## 2020-10-07 DIAGNOSIS — E538 Deficiency of other specified B group vitamins: Secondary | ICD-10-CM | POA: Insufficient documentation

## 2020-10-07 DIAGNOSIS — C180 Malignant neoplasm of cecum: Secondary | ICD-10-CM | POA: Insufficient documentation

## 2020-10-07 DIAGNOSIS — R5383 Other fatigue: Secondary | ICD-10-CM | POA: Insufficient documentation

## 2020-10-07 DIAGNOSIS — Z79899 Other long term (current) drug therapy: Secondary | ICD-10-CM | POA: Diagnosis not present

## 2020-10-07 LAB — LIPID PANEL
Cholesterol: 118 mg/dL (ref 0–200)
HDL: 43 mg/dL (ref >40)
LDL Cholesterol: 53 mg/dL (ref 0–99)
Total CHOL/HDL Ratio: 2.7 RATIO
Triglycerides: 109 mg/dL (ref <150)
VLDL: 22 mg/dL (ref 0–40)

## 2020-10-07 LAB — COMPREHENSIVE METABOLIC PANEL
ALT: 16 U/L (ref 0–44)
AST: 23 U/L (ref 15–41)
Albumin: 3.4 g/dL — ABNORMAL LOW (ref 3.5–5.0)
Alkaline Phosphatase: 50 U/L (ref 38–126)
Anion gap: 7 (ref 5–15)
BUN: 16 mg/dL (ref 8–23)
CO2: 26 mmol/L (ref 22–32)
Calcium: 8.9 mg/dL (ref 8.9–10.3)
Chloride: 104 mmol/L (ref 98–111)
Creatinine, Ser: 0.82 mg/dL (ref 0.44–1.00)
GFR, Estimated: >60 mL/min (ref >60)
Glucose, Bld: 92 mg/dL (ref 70–99)
Potassium: 3.2 mmol/L — ABNORMAL LOW (ref 3.5–5.1)
Sodium: 137 mmol/L (ref 135–145)
Total Bilirubin: 2.2 mg/dL — ABNORMAL HIGH (ref 0.3–1.2)
Total Protein: 7.3 g/dL (ref 6.5–8.1)

## 2020-10-07 LAB — CBC WITH DIFFERENTIAL/PLATELET
Abs Immature Granulocytes: 0.03 10*3/uL (ref 0.00–0.07)
Basophils Absolute: 0 10*3/uL (ref 0.0–0.1)
Basophils Relative: 1 %
Eosinophils Absolute: 0.1 10*3/uL (ref 0.0–0.5)
Eosinophils Relative: 1 %
HCT: 24.8 % — ABNORMAL LOW (ref 36.0–46.0)
Hemoglobin: 8 g/dL — ABNORMAL LOW (ref 12.0–15.0)
Immature Granulocytes: 0 %
Lymphocytes Relative: 24 %
Lymphs Abs: 1.9 10*3/uL (ref 0.7–4.0)
MCH: 22.5 pg — ABNORMAL LOW (ref 26.0–34.0)
MCHC: 32.3 g/dL (ref 30.0–36.0)
MCV: 69.9 fL — ABNORMAL LOW (ref 80.0–100.0)
Monocytes Absolute: 0.4 10*3/uL (ref 0.1–1.0)
Monocytes Relative: 5 %
Neutro Abs: 5.3 10*3/uL (ref 1.7–7.7)
Neutrophils Relative %: 69 %
Platelets: 340 10*3/uL (ref 150–400)
RBC: 3.55 MIL/uL — ABNORMAL LOW (ref 3.87–5.11)
RDW: 24.6 % — ABNORMAL HIGH (ref 11.5–15.5)
WBC: 7.7 10*3/uL (ref 4.0–10.5)
nRBC: 0 % (ref 0.0–0.2)

## 2020-10-07 LAB — IRON AND TIBC
Iron: 179 ug/dL — ABNORMAL HIGH (ref 28–170)
Saturation Ratios: 45 % — ABNORMAL HIGH (ref 10.4–31.8)
TIBC: 400 ug/dL (ref 250–450)
UIBC: 221 ug/dL

## 2020-10-07 LAB — FERRITIN: Ferritin: 125 ng/mL (ref 11–307)

## 2020-10-07 LAB — VITAMIN B12: Vitamin B-12: 461 pg/mL (ref 180–914)

## 2020-10-07 MED ORDER — CYANOCOBALAMIN 1000 MCG/ML IJ SOLN
1000.0000 ug | INTRAMUSCULAR | Status: DC
  Administered 2020-10-07: 1000 ug via INTRAMUSCULAR

## 2020-10-07 MED ORDER — POTASSIUM CHLORIDE CRYS ER 20 MEQ PO TBCR: 20.0000 meq | EXTENDED_RELEASE_TABLET | Freq: Every day | ORAL | 0 refills | Status: DC

## 2020-10-07 MED ORDER — CAPECITABINE 500 MG PO TABS
ORAL_TABLET | ORAL | 5 refills | Status: DC
  Filled 2020-10-07: qty 70, fill #0
  Filled 2020-10-19: qty 70, 21d supply, fill #0

## 2020-10-07 NOTE — Progress Notes (Signed)
Hematology/Oncology Consult note [facility]  Telephone:([phone] [phone]  Patient Care Team: Juline Patch, MD as PCP - General (Family Medicine) Corey Skains, MD as Consulting Physician (Cardiology) Clent Jacks, RN as Oncology Nurse Navigator Ronny Bacon, MD as Consulting Physician (General Surgery) Sindy Guadeloupe, MD as Consulting Physician (Oncology)   Name of the patient: Phyllis Ross  [id number]  1935/05/26   Date of visit: 10/07/20  Diagnosis- stage IIIc colon cancer s/p hemicolectomy  Chief complaint/ Reason for visit-on treatment assessment prior to cycle 2 of adjuvant Xeloda  Heme/Onc history: Patient is a 85 year old female who was 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  She is s/p laparoscopic right hemicolectomy.  Pathology showed 5.2 x 4.3 x 2.7 cm grade 2 moderately differentiated adenocarcinoma with invasion through muscularis propria into pericolorectal tissue.  7 of 27 lymph nodes positive.  Lymphovascular invasion present but no perineural invasion.  MMR negative.  T3N2BMX.  Right hemicolectomy complicated by anastomotic breakdown/leak which was treated with antibiotics.  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  Also has a history of baseline microcytic anemia secondary to beta thalassemia and baseline hemoglobin is between 10-11.   Patient has met with Dr. Mike Gip in the past management chemotherapy options including oral Xeloda has been offered to the patient   Patient started adjuvant Xeloda on 08/23/2020  Interval history-patient reports overall improvement in her energy levels as well as appetite.  She still has some ongoing fatigue.  Tolerating Xeloda well without any significant nausea vomiting diarrhea or skin rash.  She did report having heartburn and  was prescribed Protonix by Dr. Ronnald Ramp.  ECOG PS- 2 Pain scale- 0   Review of systems- Review of Systems  Constitutional:  Positive for malaise/fatigue. Negative for chills, fever and weight loss.  HENT:  Negative for congestion, ear discharge and nosebleeds.   Eyes:  Negative for blurred vision.  Respiratory:  Negative for cough, hemoptysis, sputum production, shortness of breath and wheezing.   Cardiovascular:  Negative for chest pain, palpitations, orthopnea and claudication.  Gastrointestinal:  Negative for abdominal pain, blood in stool, constipation, diarrhea, heartburn, melena, nausea and vomiting.  Genitourinary:  Negative for dysuria, flank pain, frequency, hematuria and urgency.  Musculoskeletal:  Negative for back pain, joint pain and myalgias.  Skin:  Negative for rash.  Neurological:  Negative for dizziness, tingling, focal weakness, seizures, weakness and headaches.  Endo/Heme/Allergies:  Does not bruise/bleed easily.  Psychiatric/Behavioral:  Negative for depression and suicidal ideas. The patient does not have insomnia.      Allergies  Allergen Reactions   Trazodone And Nefazodone Other (See Comments)    Made her pass out     Past Medical History:  Diagnosis Date   Anemia    Anxiety    Cancer ([place])    Hyperlipidemia    Hypertension    MI (myocardial infarction) ([place]) 2008   triple bypass   Osteoporosis    Pneumonia    as a baby   Rheumatic fever      Past Surgical History:  Procedure Laterality Date   COLONOSCOPY  2011   normal- Dr Vira Agar   COLONOSCOPY WITH PROPOFOL N/A 06/04/2020   Procedure: COLONOSCOPY WITH PROPOFOL;  Surgeon: Lucilla Lame, MD;  Location: [place];  Service: Endoscopy;  Laterality: N/A;   CORONARY ARTERY  BYPASS GRAFT     ESOPHAGOGASTRODUODENOSCOPY (EGD) WITH PROPOFOL N/A 06/04/2020   Procedure: ESOPHAGOGASTRODUODENOSCOPY (EGD) WITH PROPOFOL;  Surgeon: Lucilla Lame, MD;  Location: [place];  Service: Endoscopy;   Laterality: N/A;   VAGINAL HYSTERECTOMY     DUB    Social History   Socioeconomic History   Marital status: Married    Spouse name: Not on file   Number of children: 2   Years of education: Not on file   Highest education level: Some college, no degree  Occupational History    Employer: RETIRED  Tobacco Use   Smoking status: Never   Smokeless tobacco: Never  Vaping Use   Vaping Use: Never used  Substance and Sexual Activity   Alcohol use: No   Drug use: No   Sexual activity: Yes  Other Topics Concern   Not on file  Social History Narrative   Not on file   Social Determinants of Health   Financial Resource Strain: Low Risk    Difficulty of Paying Living Expenses: Not hard at all  Food Insecurity: No Food Insecurity   Worried About Charity fundraiser in the Last Year: Never true   [place] in the Last Year: Never true  Transportation Needs: No Transportation Needs   Lack of Transportation (Medical): No   Lack of Transportation (Non-Medical): No  Physical Activity: Sufficiently Active   Days of Exercise per Week: 6 days   Minutes of Exercise per Session: 40 min  Stress: No Stress Concern Present   Feeling of Stress : Not at all  Social Connections: Socially Integrated   Frequency of Communication with Friends and Family: More than three times a week   Frequency of Social Gatherings with Friends and Family: More than three times a week   Attends Religious Services: More than 4 times per year   Active Member of [employer] or Organizations: Yes   Attends Music therapist: More than 4 times per year   Marital Status: Married  Human resources officer Violence: Not At Risk   Fear of Current or Ex-Partner: No   Emotionally Abused: No   Physically Abused: No   Sexually Abused: No    Family History  Problem Relation Age of Onset   Heart attack Mother    Heart attack Maternal Uncle    Heart attack Maternal Uncle      Current Outpatient Medications:     atorvastatin (LIPITOR) 80 MG tablet, 1 tablet daily, Disp: 90 tablet, Rfl: 1   capecitabine (XELODA) 500 MG tablet, Take 3 tablets (1521m) by mouth in AM and 2 tablets (10028m in PM. Take with food. Take for 14 days, then hold for 7 days. Repeat every 21 days., Disp: 70 tablet, Rfl: 5   ferrous sulfate 325 (65 FE) MG tablet, Take 325 mg by mouth daily with breakfast., Disp: , Rfl:    loperamide (IMODIUM) 2 MG capsule, Take 2 mg by mouth as needed for diarrhea or loose stools., Disp: , Rfl:    megestrol (MEGACE) 40 MG tablet, Take 1 tablet (40 mg total) by mouth 2 (two) times daily., Disp: 60 tablet, Rfl: 1   pantoprazole (PROTONIX) 40 MG tablet, Take 1 tablet (40 mg total) by mouth daily., Disp: 90 tablet, Rfl: 1   ALPRAZolam (XANAX) 0.25 MG tablet, Take 1 tablet (0.25 mg total) by mouth daily as needed for anxiety. (Patient not taking: Reported on 10/07/2020), Disp: 30 tablet, Rfl: 5   ondansetron (ZOFRAN ODT) 4  MG disintegrating tablet, Take 1 tablet (4 mg total) by mouth every 8 (eight) hours as needed for nausea or vomiting. (Patient not taking: No sig reported), Disp: 30 tablet, Rfl: 0   Urea 10 % OINT, Apply 1 Dose topically in the morning and at bedtime. To hands and feet from side effects of xeloda (Patient not taking: No sig reported), Disp: 454 g, Rfl: 0 No current facility-administered medications for this visit.  Facility-Administered Medications Ordered in Other Visits:    cyanocobalamin ((VITAMIN B-12)) injection 1,000 mcg, 1,000 mcg, Intramuscular, Q21 days, Sindy Guadeloupe, MD, 1,000 mcg at 10/07/20 1153  Physical exam:  Vitals:   10/07/20 1107  BP: 111/68  Pulse: 75  Temp: (!) 97.2 F (36.2 C)  TempSrc: Tympanic  SpO2: 100%  Weight: 102 lb 6.5 oz (46.5 kg)   Physical Exam Constitutional:      General: She is not in acute distress.    Comments: Elderly female sitting in a wheelchair.  Appears in no acute distress  Cardiovascular:     Rate and Rhythm: Normal rate and  regular rhythm.     Heart sounds: Normal heart sounds.  Pulmonary:     Effort: Pulmonary effort is normal.     Breath sounds: Normal breath sounds.  Abdominal:     General: Bowel sounds are normal.     Palpations: Abdomen is soft.  Skin:    General: Skin is warm and dry.  Neurological:     Mental Status: She is alert and oriented to person, place, and time.     CMP Latest Ref Rng & Units 10/07/2020  Glucose 70 - 99 mg/dL 92  BUN 8 - 23 mg/dL 16  Creatinine 0.44 - 1.00 mg/dL 0.82  Sodium 135 - 145 mmol/L 137  Potassium 3.5 - 5.1 mmol/L 3.2(L)  Chloride 98 - 111 mmol/L 104  CO2 22 - 32 mmol/L 26  Calcium 8.9 - 10.3 mg/dL 8.9  Total Protein 6.5 - 8.1 g/dL 7.3  Total Bilirubin 0.3 - 1.2 mg/dL 2.2(H)  Alkaline Phos 38 - 126 U/L 50  AST 15 - 41 U/L 23  ALT 0 - 44 U/L 16   CBC Latest Ref Rng & Units 10/07/2020  WBC 4.0 - 10.5 K/uL 7.7  Hemoglobin 12.0 - 15.0 g/dL 8.0(L)  Hematocrit 36.0 - 46.0 % 24.8(L)  Platelets 150 - 400 K/uL 340    Assessment and plan- Patient is a 85 y.o. female with history of stage IIIc adenocarcinoma of the colon currently on adjuvant Xeloda here for routine follow-up  Patient started cycle 2 of adjuvant Xeloda 2 days ago.  She is tolerating treatment well so far except for some heartburn.  She was prescribed Protonix by Dr. Ronnald Ramp.  However Protonix has a drug interaction with Xeloda and can reduce the efficacy of Xeloda.  I have therefore asked her to stop taking the Protonix and try Pepcid instead.  I will see her back in 3 weeks prior to starting cycle 3.  Normocytic anemia: Likely secondary to chronic disease as well as B12 deficiency.  She also has baseline beta thalassemia and her hemoglobin runs around 10.  She will receive her B12 shot today and her next shot in 3 weeks iron levels are presently normal  Hypokalemia: We will send prescription for potassium for 2 weeks   Visit Diagnosis 1. High risk medication use   2. Cecal cancer ([place])   3.  Hypokalemia      Dr. Randa Evens, MD, MPH  [facility] [phone] 10/07/2020 4:25 PM

## 2020-10-12 ENCOUNTER — Other Ambulatory Visit: Payer: Self-pay

## 2020-10-19 ENCOUNTER — Other Ambulatory Visit (HOSPITAL_COMMUNITY): Payer: Self-pay

## 2020-10-20 ENCOUNTER — Other Ambulatory Visit (HOSPITAL_COMMUNITY): Payer: Self-pay

## 2020-10-23 ENCOUNTER — Inpatient Hospital Stay: Payer: Medicare PPO | Attending: Oncology

## 2020-10-23 NOTE — Progress Notes (Signed)
Nutrition Follow-up:    Patient with stage II cecal cancer s/p open colectomy.  Patient taking xeloda.  Spoke with patient via phone.  Patient reports good appetite.  Ate bacon, egg, grits, cantelope, toast, coffee and juice this am, sometimes has cereal or sausage biscuit.  Lunch maybe sandwich. Last night for dinner ate thick bologna, beans, sliced tomatoes and applesauce.  Has had fried chicken and vegetables, meatloaf, [place] and dressing.  Drinking 1 ensure complete per day. Continues with megace.  Denies any nutrition impact symptoms  Reports bowel movement 1-2 times per day, not loose/diarrhea, normal consistency  Medications: reviewed  Labs: reviewed  Anthropometrics:   Weight 102 lb 6.5 oz on 7/6  106 lb on 5/25 106 lb on 5/5 107 lb on 4/21  UBW of 120s   NUTRITION DIAGNOSIS: Inadequate oral intake improving   INTERVENTION:  Recommend increasing ensure complete to 2-3 times per day    MONITORING, EVALUATION, GOAL: weight trends, intake   NEXT VISIT: phone call on Friday August 19th  Phyllis Ross B. Phyllis Ross, [place], [place] Registered Dietitian [phone] (mobile)

## 2020-10-26 ENCOUNTER — Other Ambulatory Visit: Payer: Self-pay | Admitting: *Deleted

## 2020-10-26 ENCOUNTER — Other Ambulatory Visit: Payer: Medicare PPO

## 2020-10-26 DIAGNOSIS — C180 Malignant neoplasm of cecum: Secondary | ICD-10-CM

## 2020-10-28 ENCOUNTER — Encounter: Payer: Self-pay | Admitting: Oncology

## 2020-10-28 ENCOUNTER — Other Ambulatory Visit: Payer: Medicare PPO

## 2020-10-28 ENCOUNTER — Inpatient Hospital Stay: Payer: Medicare PPO

## 2020-10-28 ENCOUNTER — Inpatient Hospital Stay: Payer: Medicare PPO | Admitting: Oncology

## 2020-10-28 ENCOUNTER — Other Ambulatory Visit: Payer: Self-pay | Admitting: *Deleted

## 2020-10-28 ENCOUNTER — Other Ambulatory Visit: Payer: Self-pay

## 2020-10-28 VITALS — BP 101/83 | HR 70 | Temp 98.7°F | Resp 16 | Ht 65.0 in | Wt 104.9 lb

## 2020-10-28 DIAGNOSIS — C180 Malignant neoplasm of cecum: Secondary | ICD-10-CM

## 2020-10-28 DIAGNOSIS — E876 Hypokalemia: Secondary | ICD-10-CM | POA: Diagnosis not present

## 2020-10-28 DIAGNOSIS — E538 Deficiency of other specified B group vitamins: Secondary | ICD-10-CM

## 2020-10-28 DIAGNOSIS — R5383 Other fatigue: Secondary | ICD-10-CM | POA: Diagnosis not present

## 2020-10-28 DIAGNOSIS — Z8543 Personal history of malignant neoplasm of ovary: Secondary | ICD-10-CM | POA: Diagnosis not present

## 2020-10-28 DIAGNOSIS — Z79899 Other long term (current) drug therapy: Secondary | ICD-10-CM | POA: Diagnosis not present

## 2020-10-28 DIAGNOSIS — D649 Anemia, unspecified: Secondary | ICD-10-CM

## 2020-10-28 DIAGNOSIS — D561 Beta thalassemia: Secondary | ICD-10-CM | POA: Diagnosis not present

## 2020-10-28 DIAGNOSIS — D509 Iron deficiency anemia, unspecified: Secondary | ICD-10-CM

## 2020-10-28 LAB — COMPREHENSIVE METABOLIC PANEL
ALT: 22 U/L (ref 0–44)
AST: 32 U/L (ref 15–41)
Albumin: 3.3 g/dL — ABNORMAL LOW (ref 3.5–5.0)
Alkaline Phosphatase: 46 U/L (ref 38–126)
Anion gap: 7 (ref 5–15)
BUN: 20 mg/dL (ref 8–23)
CO2: 25 mmol/L (ref 22–32)
Calcium: 8.6 mg/dL — ABNORMAL LOW (ref 8.9–10.3)
Chloride: 106 mmol/L (ref 98–111)
Creatinine, Ser: 0.89 mg/dL (ref 0.44–1.00)
GFR, Estimated: >60 mL/min (ref >60)
Glucose, Bld: 109 mg/dL — ABNORMAL HIGH (ref 70–99)
Potassium: 3.8 mmol/L (ref 3.5–5.1)
Sodium: 138 mmol/L (ref 135–145)
Total Bilirubin: 1.7 mg/dL — ABNORMAL HIGH (ref 0.3–1.2)
Total Protein: 7.1 g/dL (ref 6.5–8.1)

## 2020-10-28 LAB — CBC WITH DIFFERENTIAL/PLATELET
Abs Immature Granulocytes: 0.02 10*3/uL (ref 0.00–0.07)
Basophils Absolute: 0 10*3/uL (ref 0.0–0.1)
Basophils Relative: 1 %
Eosinophils Absolute: 0.1 10*3/uL (ref 0.0–0.5)
Eosinophils Relative: 1 %
HCT: 22.7 % — ABNORMAL LOW (ref 36.0–46.0)
Hemoglobin: 7.2 g/dL — ABNORMAL LOW (ref 12.0–15.0)
Immature Granulocytes: 0 %
Lymphocytes Relative: 20 %
Lymphs Abs: 1.4 10*3/uL (ref 0.7–4.0)
MCH: 24.3 pg — ABNORMAL LOW (ref 26.0–34.0)
MCHC: 31.7 g/dL (ref 30.0–36.0)
MCV: 76.7 fL — ABNORMAL LOW (ref 80.0–100.0)
Monocytes Absolute: 0.6 10*3/uL (ref 0.1–1.0)
Monocytes Relative: 8 %
Neutro Abs: 4.8 10*3/uL (ref 1.7–7.7)
Neutrophils Relative %: 70 %
Platelets: 310 10*3/uL (ref 150–400)
RBC: 2.96 MIL/uL — ABNORMAL LOW (ref 3.87–5.11)
RDW: 25.5 % — ABNORMAL HIGH (ref 11.5–15.5)
WBC: 7 10*3/uL (ref 4.0–10.5)
nRBC: 0 % (ref 0.0–0.2)

## 2020-10-28 LAB — VITAMIN B12: Vitamin B-12: 412 pg/mL (ref 180–914)

## 2020-10-28 MED ORDER — POTASSIUM CHLORIDE CRYS ER 20 MEQ PO TBCR: 20.0000 meq | EXTENDED_RELEASE_TABLET | Freq: Every day | ORAL | 0 refills | Status: DC

## 2020-10-28 MED ORDER — CYANOCOBALAMIN 1000 MCG/ML IJ SOLN
1000.0000 ug | Freq: Once | INTRAMUSCULAR | Status: AC
  Administered 2020-10-28: 1000 ug via INTRAMUSCULAR

## 2020-10-28 NOTE — Progress Notes (Signed)
Hematology/Oncology Consult note [facility]  Telephone:([phone] [phone]  Patient Care Team: Juline Patch, MD as PCP - General (Family Medicine) Corey Skains, MD as Consulting Physician (Cardiology) Clent Jacks, RN as Oncology Nurse Navigator Ronny Bacon, MD as Consulting Physician (General Surgery) Sindy Guadeloupe, MD as Consulting Physician (Oncology)   Name of the patient: Phyllis Ross  [id number]  1935-09-26   Date of visit: 10/28/20  Diagnosis- stage IIIc colon cancer s/p hemicolectomy  Chief complaint/ Reason for visit-on treatment assessment prior to cycle 4 of adjuvant Xeloda  Heme/Onc history: Patient is a 85 year old female who was 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  She is s/p laparoscopic right hemicolectomy.  Pathology showed 5.2 x 4.3 x 2.7 cm grade 2 moderately differentiated adenocarcinoma with invasion through muscularis propria into pericolorectal tissue.  7 of 27 lymph nodes positive.  Lymphovascular invasion present but no perineural invasion.  MMR negative.  T3N2BMX.  Right hemicolectomy complicated by anastomotic breakdown/leak which was treated with antibiotics.  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  Also has a history of baseline microcytic anemia secondary to beta thalassemia and baseline hemoglobin is between 10-11.   Patient has met with Dr. Mike Gip in the past management chemotherapy options including oral Xeloda has been offered to the patient   Patient started adjuvant Xeloda on 08/23/2020  Interval history-patient reports improvement in her appetite as well as energy levels.  No recent falls.  She still has some ongoing fatigue.  Otherwise tolerating Xeloda well without any significant complaints.  ECOG PS- 2 Pain scale- 0   Review of  systems- Review of Systems  Constitutional:  Positive for malaise/fatigue. Negative for chills, fever and weight loss.  HENT:  Negative for congestion, ear discharge and nosebleeds.   Eyes:  Negative for blurred vision.  Respiratory:  Negative for cough, hemoptysis, sputum production, shortness of breath and wheezing.   Cardiovascular:  Negative for chest pain, palpitations, orthopnea and claudication.  Gastrointestinal:  Negative for abdominal pain, blood in stool, constipation, diarrhea, heartburn, melena, nausea and vomiting.  Genitourinary:  Negative for dysuria, flank pain, frequency, hematuria and urgency.  Musculoskeletal:  Negative for back pain, joint pain and myalgias.  Skin:  Negative for rash.  Neurological:  Negative for dizziness, tingling, focal weakness, seizures, weakness and headaches.  Endo/Heme/Allergies:  Does not bruise/bleed easily.  Psychiatric/Behavioral:  Negative for depression and suicidal ideas. The patient does not have insomnia.      Allergies  Allergen Reactions   Trazodone And Nefazodone Other (See Comments)    Made her pass out     Past Medical History:  Diagnosis Date   Anemia    Anxiety    Cancer ([place])    Hyperlipidemia    Hypertension    MI (myocardial infarction) ([place]) 2008   triple bypass   Osteoporosis    Pneumonia    as a baby   Rheumatic fever      Past Surgical History:  Procedure Laterality Date   COLONOSCOPY  2011   normal- Dr Vira Agar   COLONOSCOPY WITH PROPOFOL N/A 06/04/2020   Procedure: COLONOSCOPY WITH PROPOFOL;  Surgeon: Lucilla Lame, MD;  Location: [place];  Service: Endoscopy;  Laterality: N/A;   CORONARY ARTERY BYPASS GRAFT     ESOPHAGOGASTRODUODENOSCOPY (EGD) WITH PROPOFOL N/A 06/04/2020  Procedure: ESOPHAGOGASTRODUODENOSCOPY (EGD) WITH PROPOFOL;  Surgeon: Lucilla Lame, MD;  Location: [place];  Service: Endoscopy;  Laterality: N/A;   VAGINAL HYSTERECTOMY     DUB    Social History    Socioeconomic History   Marital status: Married    Spouse name: Not on file   Number of children: 2   Years of education: Not on file   Highest education level: Some college, no degree  Occupational History    Employer: RETIRED  Tobacco Use   Smoking status: Never   Smokeless tobacco: Never  Vaping Use   Vaping Use: Never used  Substance and Sexual Activity   Alcohol use: No   Drug use: No   Sexual activity: Yes  Other Topics Concern   Not on file  Social History Narrative   Not on file   Social Determinants of Health   Financial Resource Strain: Low Risk    Difficulty of Paying Living Expenses: Not hard at all  Food Insecurity: No Food Insecurity   Worried About Charity fundraiser in the Last Year: Never true   [place] in the Last Year: Never true  Transportation Needs: No Transportation Needs   Lack of Transportation (Medical): No   Lack of Transportation (Non-Medical): No  Physical Activity: Sufficiently Active   Days of Exercise per Week: 6 days   Minutes of Exercise per Session: 40 min  Stress: No Stress Concern Present   Feeling of Stress : Not at all  Social Connections: Socially Integrated   Frequency of Communication with Friends and Family: More than three times a week   Frequency of Social Gatherings with Friends and Family: More than three times a week   Attends Religious Services: More than 4 times per year   Active Member of [employer] or Organizations: Yes   Attends Music therapist: More than 4 times per year   Marital Status: Married  Human resources officer Violence: Not At Risk   Fear of Current or Ex-Partner: No   Emotionally Abused: No   Physically Abused: No   Sexually Abused: No    Family History  Problem Relation Age of Onset   Heart attack Mother    Heart attack Maternal Uncle    Heart attack Maternal Uncle      Current Outpatient Medications:    atorvastatin (LIPITOR) 80 MG tablet, 1 tablet daily, Disp: 90 tablet,  Rfl: 1   capecitabine (XELODA) 500 MG tablet, Take 3 tablets (1517m) by mouth in AM and 2 tablets (10033m in PM. Take with food. Take for 14 days, then hold for 7 days. Repeat every 21 days., Disp: 70 tablet, Rfl: 5   ferrous sulfate 325 (65 FE) MG tablet, Take 325 mg by mouth daily with breakfast., Disp: , Rfl:    loperamide (IMODIUM) 2 MG capsule, Take 2 mg by mouth as needed for diarrhea or loose stools., Disp: , Rfl:    megestrol (MEGACE) 40 MG tablet, Take 1 tablet (40 mg total) by mouth 2 (two) times daily., Disp: 60 tablet, Rfl: 1   potassium chloride SA (KLOR-CON) 20 MEQ tablet, Take 1 tablet (20 mEq total) by mouth daily., Disp: 14 tablet, Rfl: 0   ALPRAZolam (XANAX) 0.25 MG tablet, Take 1 tablet (0.25 mg total) by mouth daily as needed for anxiety. (Patient not taking: No sig reported), Disp: 30 tablet, Rfl: 5   ondansetron (ZOFRAN ODT) 4 MG disintegrating tablet, Take 1 tablet (4 mg total) by mouth every  8 (eight) hours as needed for nausea or vomiting. (Patient not taking: No sig reported), Disp: 30 tablet, Rfl: 0   Urea 10 % OINT, Apply 1 Dose topically in the morning and at bedtime. To hands and feet from side effects of xeloda (Patient not taking: No sig reported), Disp: 454 g, Rfl: 0  Physical exam:  Vitals:   10/28/20 1453  BP: 101/83  Pulse: 70  Resp: 16  Temp: 98.7 F (37.1 C)  TempSrc: Oral  Weight: 104 lb 15 oz (47.6 kg)  Height: _0  (1.651 m)   Physical Exam Constitutional:      General: She is not in acute distress.    Comments: Elderly female sitting in a wheelchair.  Appears fatigued  Cardiovascular:     Rate and Rhythm: Normal rate and regular rhythm.     Heart sounds: Normal heart sounds.  Pulmonary:     Effort: Pulmonary effort is normal.     Breath sounds: Normal breath sounds.  Abdominal:     General: Bowel sounds are normal.     Palpations: Abdomen is soft.  Musculoskeletal:     Right lower leg: No edema.     Left lower leg: No edema.  Skin:     General: Skin is warm and dry.  Neurological:     Mental Status: She is alert and oriented to person, place, and time.     CMP Latest Ref Rng & Units 10/28/2020  Glucose 70 - 99 mg/dL 109(H)  BUN 8 - 23 mg/dL 20  Creatinine 0.44 - 1.00 mg/dL 0.89  Sodium 135 - 145 mmol/L 138  Potassium 3.5 - 5.1 mmol/L 3.8  Chloride 98 - 111 mmol/L 106  CO2 22 - 32 mmol/L 25  Calcium 8.9 - 10.3 mg/dL 8.6(L)  Total Protein 6.5 - 8.1 g/dL 7.1  Total Bilirubin 0.3 - 1.2 mg/dL 1.7(H)  Alkaline Phos 38 - 126 U/L 46  AST 15 - 41 U/L 32  ALT 0 - 44 U/L 22   CBC Latest Ref Rng & Units 10/28/2020  WBC 4.0 - 10.5 K/uL 7.0  Hemoglobin 12.0 - 15.0 g/dL 7.2(L)  Hematocrit 36.0 - 46.0 % 22.7(L)  Platelets 150 - 400 K/uL 310      Assessment and plan- Patient is a 85 y.o. female with stage IIIc adenocarcinoma of the colon here for on treatment assessment prior to cycle 4 of adjuvant Xeloda  Patient has baseline beta thalassemia and her hemoglobin runs around 10.  Her hemoglobin had dropped down to 9 following her surgery.  Anemia work-up had revealed evidence of B12 deficiency and patient has been on monthly B12 injections.  She did not have any iron deficiency.  After she was started on Xeloda in May 2022 she has had a progressive decline in her hemoglobin from 9.3-8 0.4-8 and now 7.2.  I am concerned that her anemia is due to Xeloda since there is no other obvious reversible factors.  Her B12 levels have normalized but her anemia continues to worsen.  I have therefore asked her to hold off on doing cycle 4 of Xeloda at this time.  She is taking 2 days so far and will not take it henceforth.  Repeat CBC, hold due for possible transfusion ferritin and iron studies in 2 weeks and I will see her back in 4 weeks with CBC with differential CMP and CEA.  She will also receive a B12 shot on that day  Hypokalemia: Presently potassium levels are normal and  we will renew her potassium as well today   Visit Diagnosis 1.  Cecal cancer ([employer])   2. B12 deficiency   3. High risk medication use   4. Normocytic anemia      Dr. Randa Evens, MD, MPH [facility] [phone] 10/28/2020 4:09 PM

## 2020-10-28 NOTE — Progress Notes (Signed)
Pt eating and drinking good,  2 ensures a day. Sometimes after eating has stool and then loose but some shape to it. One day she did have diarrhea and took imodium and it was fine. She has good energy these last 5 days. Stays cold all the time

## 2020-10-29 LAB — CA 125: Cancer Antigen (CA) 125: 31.6 U/mL (ref 0.0–38.1)

## 2020-11-06 DIAGNOSIS — I252 Old myocardial infarction: Secondary | ICD-10-CM | POA: Diagnosis not present

## 2020-11-06 DIAGNOSIS — I2510 Atherosclerotic heart disease of native coronary artery without angina pectoris: Secondary | ICD-10-CM | POA: Diagnosis not present

## 2020-11-06 DIAGNOSIS — Z7722 Contact with and (suspected) exposure to environmental tobacco smoke (acute) (chronic): Secondary | ICD-10-CM | POA: Diagnosis not present

## 2020-11-06 DIAGNOSIS — R636 Underweight: Secondary | ICD-10-CM | POA: Diagnosis not present

## 2020-11-06 DIAGNOSIS — R030 Elevated blood-pressure reading, without diagnosis of hypertension: Secondary | ICD-10-CM | POA: Diagnosis not present

## 2020-11-06 DIAGNOSIS — D649 Anemia, unspecified: Secondary | ICD-10-CM | POA: Diagnosis not present

## 2020-11-06 DIAGNOSIS — E785 Hyperlipidemia, unspecified: Secondary | ICD-10-CM | POA: Diagnosis not present

## 2020-11-06 DIAGNOSIS — Z681 Body mass index (BMI) 19 or less, adult: Secondary | ICD-10-CM | POA: Diagnosis not present

## 2020-11-06 DIAGNOSIS — I739 Peripheral vascular disease, unspecified: Secondary | ICD-10-CM | POA: Diagnosis not present

## 2020-11-09 ENCOUNTER — Other Ambulatory Visit: Payer: Self-pay | Admitting: Family Medicine

## 2020-11-11 ENCOUNTER — Ambulatory Visit: Payer: Medicare PPO

## 2020-11-11 ENCOUNTER — Other Ambulatory Visit: Payer: Medicare PPO

## 2020-11-11 ENCOUNTER — Other Ambulatory Visit: Payer: Self-pay

## 2020-11-11 ENCOUNTER — Inpatient Hospital Stay: Payer: Medicare PPO | Attending: Oncology

## 2020-11-11 DIAGNOSIS — D509 Iron deficiency anemia, unspecified: Secondary | ICD-10-CM | POA: Insufficient documentation

## 2020-11-11 DIAGNOSIS — E538 Deficiency of other specified B group vitamins: Secondary | ICD-10-CM | POA: Diagnosis not present

## 2020-11-11 DIAGNOSIS — C180 Malignant neoplasm of cecum: Secondary | ICD-10-CM | POA: Diagnosis not present

## 2020-11-11 LAB — CBC WITH DIFFERENTIAL/PLATELET
Abs Immature Granulocytes: 0.03 10*3/uL (ref 0.00–0.07)
Basophils Absolute: 0.1 10*3/uL (ref 0.0–0.1)
Basophils Relative: 1 %
Eosinophils Absolute: 0.3 10*3/uL (ref 0.0–0.5)
Eosinophils Relative: 4 %
HCT: 27.7 % — ABNORMAL LOW (ref 36.0–46.0)
Hemoglobin: 8.9 g/dL — ABNORMAL LOW (ref 12.0–15.0)
Immature Granulocytes: 0 %
Lymphocytes Relative: 25 %
Lymphs Abs: 1.9 10*3/uL (ref 0.7–4.0)
MCH: 25 pg — ABNORMAL LOW (ref 26.0–34.0)
MCHC: 32.1 g/dL (ref 30.0–36.0)
MCV: 77.8 fL — ABNORMAL LOW (ref 80.0–100.0)
Monocytes Absolute: 0.7 10*3/uL (ref 0.1–1.0)
Monocytes Relative: 9 %
Neutro Abs: 4.8 10*3/uL (ref 1.7–7.7)
Neutrophils Relative %: 61 %
Platelets: 299 10*3/uL (ref 150–400)
RBC: 3.56 MIL/uL — ABNORMAL LOW (ref 3.87–5.11)
RDW: 19.5 % — ABNORMAL HIGH (ref 11.5–15.5)
WBC: 7.7 10*3/uL (ref 4.0–10.5)
nRBC: 0 % (ref 0.0–0.2)

## 2020-11-11 LAB — IRON AND TIBC
Iron: 62 ug/dL (ref 28–170)
Saturation Ratios: 15 % (ref 10.4–31.8)
TIBC: 407 ug/dL (ref 250–450)
UIBC: 345 ug/dL

## 2020-11-11 LAB — SAMPLE TO BLOOD BANK

## 2020-11-11 LAB — FERRITIN: Ferritin: 65 ng/mL (ref 11–307)

## 2020-11-12 ENCOUNTER — Ambulatory Visit: Payer: Medicare PPO

## 2020-11-19 ENCOUNTER — Other Ambulatory Visit: Payer: Self-pay | Admitting: *Deleted

## 2020-11-19 MED ORDER — POTASSIUM CHLORIDE CRYS ER 20 MEQ PO TBCR: 20.0000 meq | EXTENDED_RELEASE_TABLET | Freq: Every day | ORAL | 0 refills | Status: DC

## 2020-11-20 ENCOUNTER — Inpatient Hospital Stay: Payer: Medicare PPO | Attending: Oncology

## 2020-11-20 NOTE — Progress Notes (Signed)
Nutrition Follow-up:  Patient with stage II cecal cancer s/p open colectomy.  Patient's xeloda is on hold currently  Spoke with patient via phone.  Patient reports that her appetite is good. "I am getting hungry now."  Reports ate eggs, bacon, grits, cantelope, toast and coffee and juice this am.  Last night for dinner ate pulled pork, mashed potatoes, beans and corn.  Later after dinner ate slice of pepperoni pizza.  Had lunch yesterday but can't remember what she ate.  Drinking 2 ensure completes per day.  "I love them."  Denies diarrhea or any nutrition impact symptomes    Medications: reviewed  Labs: reviewed  Anthropometrics:   Weight 104 lb 15 oz on 7/27  102 lb on 6.5 oz on 7/6  106 lb on 5/25 106 lb on 5/5 107 lb on 4/21   NUTRITION DIAGNOSIS: Inadequate oral intake improved   INTERVENTION:  Continue oral nutrition supplements and high calorie high protein foods. Patient to call RD if needed in the future Contact number given    NEXT VISIT: patient prefers to call RD if needed  Skylen Spiering B. Zenia Resides, [place], [place] Registered Dietitian [phone] (mobile)

## 2020-11-25 ENCOUNTER — Inpatient Hospital Stay (HOSPITAL_BASED_OUTPATIENT_CLINIC_OR_DEPARTMENT_OTHER): Payer: Medicare PPO | Admitting: Oncology

## 2020-11-25 ENCOUNTER — Other Ambulatory Visit: Payer: Self-pay

## 2020-11-25 ENCOUNTER — Inpatient Hospital Stay: Payer: Medicare PPO

## 2020-11-25 ENCOUNTER — Other Ambulatory Visit (HOSPITAL_COMMUNITY): Payer: Self-pay

## 2020-11-25 VITALS — BP 114/60 | HR 70 | Temp 98.3°F | Resp 16 | Wt 105.3 lb

## 2020-11-25 DIAGNOSIS — C180 Malignant neoplasm of cecum: Secondary | ICD-10-CM

## 2020-11-25 DIAGNOSIS — D509 Iron deficiency anemia, unspecified: Secondary | ICD-10-CM

## 2020-11-25 DIAGNOSIS — E538 Deficiency of other specified B group vitamins: Secondary | ICD-10-CM | POA: Diagnosis not present

## 2020-11-25 DIAGNOSIS — Z79899 Other long term (current) drug therapy: Secondary | ICD-10-CM

## 2020-11-25 LAB — COMPREHENSIVE METABOLIC PANEL
ALT: 25 U/L (ref 0–44)
AST: 24 U/L (ref 15–41)
Albumin: 3.3 g/dL — ABNORMAL LOW (ref 3.5–5.0)
Alkaline Phosphatase: 49 U/L (ref 38–126)
Anion gap: 6 (ref 5–15)
BUN: 20 mg/dL (ref 8–23)
CO2: 27 mmol/L (ref 22–32)
Calcium: 8.9 mg/dL (ref 8.9–10.3)
Chloride: 107 mmol/L (ref 98–111)
Creatinine, Ser: 0.95 mg/dL (ref 0.44–1.00)
GFR, Estimated: 60 mL/min — ABNORMAL LOW (ref >60)
Glucose, Bld: 106 mg/dL — ABNORMAL HIGH (ref 70–99)
Potassium: 3.8 mmol/L (ref 3.5–5.1)
Sodium: 140 mmol/L (ref 135–145)
Total Bilirubin: 1 mg/dL (ref 0.3–1.2)
Total Protein: 7.2 g/dL (ref 6.5–8.1)

## 2020-11-25 LAB — CBC WITH DIFFERENTIAL/PLATELET
Abs Immature Granulocytes: 0.02 10*3/uL (ref 0.00–0.07)
Basophils Absolute: 0 10*3/uL (ref 0.0–0.1)
Basophils Relative: 1 %
Eosinophils Absolute: 0.3 10*3/uL (ref 0.0–0.5)
Eosinophils Relative: 4 %
HCT: 28.7 % — ABNORMAL LOW (ref 36.0–46.0)
Hemoglobin: 9.2 g/dL — ABNORMAL LOW (ref 12.0–15.0)
Immature Granulocytes: 0 %
Lymphocytes Relative: 24 %
Lymphs Abs: 1.8 10*3/uL (ref 0.7–4.0)
MCH: 24 pg — ABNORMAL LOW (ref 26.0–34.0)
MCHC: 32.1 g/dL (ref 30.0–36.0)
MCV: 74.9 fL — ABNORMAL LOW (ref 80.0–100.0)
Monocytes Absolute: 0.7 10*3/uL (ref 0.1–1.0)
Monocytes Relative: 9 %
Neutro Abs: 4.7 10*3/uL (ref 1.7–7.7)
Neutrophils Relative %: 62 %
Platelets: 316 10*3/uL (ref 150–400)
RBC: 3.83 MIL/uL — ABNORMAL LOW (ref 3.87–5.11)
RDW: 15.9 % — ABNORMAL HIGH (ref 11.5–15.5)
WBC: 7.5 10*3/uL (ref 4.0–10.5)
nRBC: 0 % (ref 0.0–0.2)

## 2020-11-25 MED ORDER — CYANOCOBALAMIN 1000 MCG/ML IJ SOLN
1000.0000 ug | INTRAMUSCULAR | Status: DC
  Administered 2020-11-25: 1000 ug via INTRAMUSCULAR

## 2020-11-26 ENCOUNTER — Other Ambulatory Visit: Payer: Self-pay | Admitting: Pharmacist

## 2020-11-26 ENCOUNTER — Other Ambulatory Visit (HOSPITAL_COMMUNITY): Payer: Self-pay

## 2020-11-26 DIAGNOSIS — C180 Malignant neoplasm of cecum: Secondary | ICD-10-CM

## 2020-11-26 LAB — CEA: CEA: 4.2 ng/mL (ref 0.0–4.7)

## 2020-11-26 MED ORDER — CAPECITABINE 500 MG PO TABS
ORAL_TABLET | ORAL | 0 refills | Status: DC
Start: 2020-11-26 — End: 2021-01-04
  Filled 2020-11-26: qty 42, fill #0
  Filled 2020-12-17: qty 42, 21d supply, fill #0

## 2020-11-29 ENCOUNTER — Encounter: Payer: Self-pay | Admitting: Oncology

## 2020-11-29 NOTE — Progress Notes (Signed)
I connected with Phyllis Ross on 11/29/20 at  2:15 PM EDT by video enabled telemedicine visit and verified that I am speaking with the correct person using two identifiers.   I discussed the limitations, risks, security and privacy concerns of performing an evaluation and management service by telemedicine and the availability of in-person appointments. I also discussed with the patient that there may be a patient responsible charge related to this service. The patient expressed understanding and agreed to proceed.  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  She is s/p laparoscopic right hemicolectomy.  Pathology showed 5.2 x 4.3 x 2.7 cm grade 2 moderately differentiated adenocarcinoma with invasion through muscularis propria into pericolorectal tissue.  7 of 27 lymph nodes positive.  Lymphovascular invasion present but no perineural invasion.  MMR negative.  T3N2BMX.  Right hemicolectomy complicated by anastomotic breakdown/leak which was treated with antibiotics.  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  Also has a history of baseline microcytic anemia secondary to beta thalassemia and baseline hemoglobin is between 10-11.   Patient has met with Dr. Mike Gip in the past management chemotherapy options including oral Xeloda has been offered to the patient   Patient started adjuvant Xeloda on 08/23/2020. It was held after 4 cycles due to worsening anemia  Interval history: patient received 4 cycles of adjuvant Xeloda following which it was held due to worsening anemia.   Patient reports doing much better overall over the last 3 months.  She is more up and about.  Appetite is improving on Megace.  Weight is up 205 pounds from 102 pounds last month.     Review of Systems  Constitutional:  Positive for malaise/fatigue. Negative for chills, fever and weight loss.  HENT:  Negative for congestion, ear discharge and nosebleeds.   Eyes:  Negative for blurred vision.  Respiratory:  Negative for cough, hemoptysis, sputum production, shortness of breath and wheezing.   Cardiovascular:  Negative for chest pain, palpitations, orthopnea and claudication.  Gastrointestinal:  Negative for abdominal pain, blood in stool, constipation, diarrhea, heartburn, melena, nausea and vomiting.  Genitourinary:  Negative for dysuria, flank pain, frequency, hematuria and urgency.  Musculoskeletal:  Negative for back pain, joint pain and myalgias.  Skin:  Negative for rash.  Neurological:  Negative for dizziness, tingling, focal weakness, seizures, weakness and headaches.  Endo/Heme/Allergies:  Does not bruise/bleed easily.  Psychiatric/Behavioral:  Negative for depression and suicidal ideas. The patient does not have insomnia.    Allergies  Allergen Reactions   Trazodone And Nefazodone Other (See Comments)    Made her pass out    Past Medical History:  Diagnosis Date   Anemia    Anxiety    Cancer ([place])    Hyperlipidemia    Hypertension    MI (myocardial infarction) ([place]) 2008   triple bypass   Osteoporosis    Pneumonia    as a baby   Rheumatic fever     Past Surgical History:  Procedure Laterality Date   COLONOSCOPY  2011   normal- Dr Vira Agar   COLONOSCOPY WITH PROPOFOL N/A 06/04/2020  Procedure: COLONOSCOPY WITH PROPOFOL;  Surgeon: Lucilla Lame, MD;  Location: [place];  Service: Endoscopy;  Laterality: N/A;   CORONARY ARTERY BYPASS GRAFT     ESOPHAGOGASTRODUODENOSCOPY (EGD) WITH PROPOFOL N/A 06/04/2020   Procedure: ESOPHAGOGASTRODUODENOSCOPY (EGD) WITH  PROPOFOL;  Surgeon: Lucilla Lame, MD;  Location: [place];  Service: Endoscopy;  Laterality: N/A;   VAGINAL HYSTERECTOMY     DUB    Social History   Socioeconomic History   Marital status: Married    Spouse name: Not on file   Number of children: 2   Years of education: Not on file   Highest education level: Some college, no degree  Occupational History    Employer: RETIRED  Tobacco Use   Smoking status: Never   Smokeless tobacco: Never  Vaping Use   Vaping Use: Never used  Substance and Sexual Activity   Alcohol use: No   Drug use: No   Sexual activity: Yes  Other Topics Concern   Not on file  Social History Narrative   Not on file   Social Determinants of Health   Financial Resource Strain: Low Risk    Difficulty of Paying Living Expenses: Not hard at all  Food Insecurity: No Food Insecurity   Worried About Charity fundraiser in the Last Year: Never true   [place] in the Last Year: Never true  Transportation Needs: No Transportation Needs   Lack of Transportation (Medical): No   Lack of Transportation (Non-Medical): No  Physical Activity: Sufficiently Active   Days of Exercise per Week: 6 days   Minutes of Exercise per Session: 40 min  Stress: No Stress Concern Present   Feeling of Stress : Not at all  Social Connections: Socially Integrated   Frequency of Communication with Friends and Family: More than three times a week   Frequency of Social Gatherings with Friends and Family: More than three times a week   Attends Religious Services: More than 4 times per year   Active Member of [employer] or Organizations: Yes   Attends Music therapist: More than 4 times per year   Marital Status: Married  Human resources officer Violence: Not At Risk   Fear of Current or Ex-Partner: No   Emotionally Abused: No   Physically Abused: No   Sexually Abused: No    Family History  Problem Relation Age of Onset   Heart attack Mother    Heart attack  Maternal Uncle    Heart attack Maternal Uncle      Current Outpatient Medications:    atorvastatin (LIPITOR) 80 MG tablet, 1 tablet daily, Disp: 90 tablet, Rfl: 1   ferrous sulfate 325 (65 FE) MG tablet, Take 325 mg by mouth daily with breakfast., Disp: , Rfl:    loperamide (IMODIUM) 2 MG capsule, Take 2 mg by mouth as needed for diarrhea or loose stools., Disp: , Rfl:    megestrol (MEGACE) 40 MG tablet, Take 1 tablet (40 mg total) by mouth 2 (two) times daily., Disp: 60 tablet, Rfl: 1   potassium chloride SA (KLOR-CON) 20 MEQ tablet, Take 1 tablet (20 mEq total) by mouth daily., Disp: 30 tablet, Rfl: 0   ALPRAZolam (XANAX) 0.25 MG tablet, Take 1 tablet (0.25 mg total) by mouth daily as needed for anxiety. (Patient not taking: No sig reported), Disp: 30 tablet, Rfl: 5   capecitabine (XELODA) 500 MG tablet, Take 2 tablets (1058m) by mouth in AM and 1 tablet (5057m in PM. Take  with food. Take for 14 days, then hold for 7 days. Repeat every 21 days., Disp: 42 tablet, Rfl: 0   lisinopril (ZESTRIL) 10 MG tablet, TAKE ONE TABLET BY MOUTH TWICE DAILY (Patient not taking: Reported on 11/25/2020), Disp: 180 tablet, Rfl: 1   ondansetron (ZOFRAN ODT) 4 MG disintegrating tablet, Take 1 tablet (4 mg total) by mouth every 8 (eight) hours as needed for nausea or vomiting. (Patient not taking: No sig reported), Disp: 30 tablet, Rfl: 0   Urea 10 % OINT, Apply 1 Dose topically in the morning and at bedtime. To hands and feet from side effects of xeloda (Patient not taking: No sig reported), Disp: 454 g, Rfl: 0  Current Facility-Administered Medications:    cyanocobalamin ((VITAMIN B-12)) injection 1,000 mcg, 1,000 mcg, Intramuscular, Q21 days, Sindy Guadeloupe, MD, 1,000 mcg at 11/25/20 1438  No results found.  No images are attached to the encounter.   CMP Latest Ref Rng & Units 11/25/2020  Glucose 70 - 99 mg/dL 106(H)  BUN 8 - 23 mg/dL 20  Creatinine 0.44 - 1.00 mg/dL 0.95  Sodium 135 - 145 mmol/L 140   Potassium 3.5 - 5.1 mmol/L 3.8  Chloride 98 - 111 mmol/L 107  CO2 22 - 32 mmol/L 27  Calcium 8.9 - 10.3 mg/dL 8.9  Total Protein 6.5 - 8.1 g/dL 7.2  Total Bilirubin 0.3 - 1.2 mg/dL 1.0  Alkaline Phos 38 - 126 U/L 49  AST 15 - 41 U/L 24  ALT 0 - 44 U/L 25   CBC Latest Ref Rng & Units 11/25/2020  WBC 4.0 - 10.5 K/uL 7.5  Hemoglobin 12.0 - 15.0 g/dL 9.2(L)  Hematocrit 36.0 - 46.0 % 28.7(L)  Platelets 150 - 400 K/uL 316     Observation/objective: Appears in no acute distress over video visit today.  Breathing is nonlabored  Assessment and plan: Patient is a 39-year-old female with history of stage IIIc adenocarcinoma of the colon s/p 4 cycles of adjuvant Xeloda which was held due to worsening anemia here for routine follow-up  It decreased down to 7.2 while on Xeloda and anemia work-up did not reveal any evidence of iron deficiency.  She did have evidence of B12 deficiency for which she was given B12 injections but anemia continued to worsen and therefore is a lot of was held for 1 cycle.  Today her hemoglobin is improved to 9.2.  Baseline microcytosis is secondary to thalassemia.  We discussed one of the 2 following options moving forward: Stopping Xeloda altogether Reducing the dose of Xeloda further from 1000 mg in the morning and 500 mg in the evening to 500 mg twice daily and see if her anemia would stabilize with that regimen.  Patient chooses to proceed with option 2.  She will receive a B12 shot today and I will see her back in 3 weeks prior to start of cycle 6 of adjuvant Xeloda.  She does have leftover tablets from the last treatment which will take her through cycle 5  Patient has baseline microcytic anemia secondary to thalassemia and her hemoglobin runs around 10.  Follow-up instructions: As above  I discussed the assessment and treatment plan with the patient. The patient was provided an opportunity to ask questions and all were answered. The patient agreed with the plan and  demonstrated an understanding of the instructions.   The patient was advised to call back or seek an in-person evaluation if the symptoms worsen or if the condition fails to improve as  anticipated.    Visit Diagnosis: 1. B12 deficiency   2. Microcytic anemia   3. High risk medication use   4. Cecal cancer ([place])     Dr. Randa Evens, MD, MPH [facility] at [facility] Tel- [phone] 11/29/2020 7:06 PM

## 2020-12-14 ENCOUNTER — Encounter: Payer: Self-pay | Admitting: Oncology

## 2020-12-14 ENCOUNTER — Other Ambulatory Visit: Payer: Self-pay | Admitting: *Deleted

## 2020-12-14 MED ORDER — POTASSIUM CHLORIDE CRYS ER 20 MEQ PO TBCR: 20.0000 meq | EXTENDED_RELEASE_TABLET | Freq: Every day | ORAL | 0 refills | Status: DC

## 2020-12-14 NOTE — Telephone Encounter (Signed)
Component Ref Range & Units 2 wk ago  (11/25/20) 1 mo ago  (10/28/20) 2 mo ago  (10/07/20) 2 mo ago  (09/16/20) 3 mo ago  (08/26/20) 4 mo ago  (07/23/20) 5 mo ago  (06/25/20)  Potassium 3.5 - 5.1 mmol/L 3.8  3.8  3.2 Low   3.0 Low   3.5  3.4 Low   4.5

## 2020-12-16 ENCOUNTER — Ambulatory Visit: Payer: Medicare PPO | Admitting: Oncology

## 2020-12-16 ENCOUNTER — Other Ambulatory Visit: Payer: Medicare PPO

## 2020-12-17 ENCOUNTER — Other Ambulatory Visit (HOSPITAL_COMMUNITY): Payer: Self-pay

## 2020-12-21 ENCOUNTER — Other Ambulatory Visit (HOSPITAL_COMMUNITY): Payer: Self-pay

## 2020-12-29 ENCOUNTER — Encounter: Payer: Self-pay | Admitting: Oncology

## 2020-12-29 ENCOUNTER — Other Ambulatory Visit: Payer: Self-pay

## 2020-12-29 ENCOUNTER — Inpatient Hospital Stay: Payer: Medicare PPO | Attending: Oncology

## 2020-12-29 DIAGNOSIS — D561 Beta thalassemia: Secondary | ICD-10-CM | POA: Insufficient documentation

## 2020-12-29 DIAGNOSIS — Z8249 Family history of ischemic heart disease and other diseases of the circulatory system: Secondary | ICD-10-CM | POA: Insufficient documentation

## 2020-12-29 DIAGNOSIS — Z79899 Other long term (current) drug therapy: Secondary | ICD-10-CM | POA: Diagnosis not present

## 2020-12-29 DIAGNOSIS — D509 Iron deficiency anemia, unspecified: Secondary | ICD-10-CM

## 2020-12-29 DIAGNOSIS — E538 Deficiency of other specified B group vitamins: Secondary | ICD-10-CM | POA: Diagnosis not present

## 2020-12-29 DIAGNOSIS — C180 Malignant neoplasm of cecum: Secondary | ICD-10-CM

## 2020-12-29 DIAGNOSIS — I10 Essential (primary) hypertension: Secondary | ICD-10-CM | POA: Diagnosis not present

## 2020-12-29 DIAGNOSIS — R5383 Other fatigue: Secondary | ICD-10-CM | POA: Insufficient documentation

## 2020-12-29 DIAGNOSIS — I252 Old myocardial infarction: Secondary | ICD-10-CM | POA: Insufficient documentation

## 2020-12-29 LAB — IRON AND TIBC
Iron: 85 ug/dL (ref 28–170)
Saturation Ratios: 22 % (ref 10.4–31.8)
TIBC: 389 ug/dL (ref 250–450)
UIBC: 304 ug/dL

## 2020-12-29 LAB — COMPREHENSIVE METABOLIC PANEL
ALT: 35 U/L (ref 0–44)
AST: 29 U/L (ref 15–41)
Albumin: 3.8 g/dL (ref 3.5–5.0)
Alkaline Phosphatase: 55 U/L (ref 38–126)
Anion gap: 6 (ref 5–15)
BUN: 20 mg/dL (ref 8–23)
CO2: 25 mmol/L (ref 22–32)
Calcium: 9 mg/dL (ref 8.9–10.3)
Chloride: 105 mmol/L (ref 98–111)
Creatinine, Ser: 0.98 mg/dL (ref 0.44–1.00)
GFR, Estimated: 58 mL/min — ABNORMAL LOW (ref >60)
Glucose, Bld: 123 mg/dL — ABNORMAL HIGH (ref 70–99)
Potassium: 3.6 mmol/L (ref 3.5–5.1)
Sodium: 136 mmol/L (ref 135–145)
Total Bilirubin: 1.7 mg/dL — ABNORMAL HIGH (ref 0.3–1.2)
Total Protein: 7.6 g/dL (ref 6.5–8.1)

## 2020-12-29 LAB — CBC WITH DIFFERENTIAL/PLATELET
Abs Immature Granulocytes: 0.02 10*3/uL (ref 0.00–0.07)
Basophils Absolute: 0 10*3/uL (ref 0.0–0.1)
Basophils Relative: 0 %
Eosinophils Absolute: 0.1 10*3/uL (ref 0.0–0.5)
Eosinophils Relative: 2 %
HCT: 28.5 % — ABNORMAL LOW (ref 36.0–46.0)
Hemoglobin: 9.4 g/dL — ABNORMAL LOW (ref 12.0–15.0)
Immature Granulocytes: 0 %
Lymphocytes Relative: 30 %
Lymphs Abs: 2.3 10*3/uL (ref 0.7–4.0)
MCH: 23.5 pg — ABNORMAL LOW (ref 26.0–34.0)
MCHC: 33 g/dL (ref 30.0–36.0)
MCV: 71.3 fL — ABNORMAL LOW (ref 80.0–100.0)
Monocytes Absolute: 0.6 10*3/uL (ref 0.1–1.0)
Monocytes Relative: 7 %
Neutro Abs: 4.6 10*3/uL (ref 1.7–7.7)
Neutrophils Relative %: 61 %
Platelets: 314 10*3/uL (ref 150–400)
RBC: 4 MIL/uL (ref 3.87–5.11)
RDW: 17.6 % — ABNORMAL HIGH (ref 11.5–15.5)
WBC: 7.6 10*3/uL (ref 4.0–10.5)
nRBC: 0 % (ref 0.0–0.2)

## 2020-12-29 LAB — FERRITIN: Ferritin: 84 ng/mL (ref 11–307)

## 2020-12-30 ENCOUNTER — Inpatient Hospital Stay: Payer: Medicare PPO

## 2020-12-30 ENCOUNTER — Other Ambulatory Visit (HOSPITAL_COMMUNITY): Payer: Self-pay

## 2020-12-30 ENCOUNTER — Inpatient Hospital Stay (HOSPITAL_BASED_OUTPATIENT_CLINIC_OR_DEPARTMENT_OTHER): Payer: Medicare PPO | Admitting: Oncology

## 2020-12-30 ENCOUNTER — Telehealth: Payer: Self-pay | Admitting: *Deleted

## 2020-12-30 ENCOUNTER — Other Ambulatory Visit: Payer: Medicare PPO

## 2020-12-30 ENCOUNTER — Encounter: Payer: Self-pay | Admitting: Oncology

## 2020-12-30 ENCOUNTER — Ambulatory Visit: Payer: Medicare PPO | Admitting: Oncology

## 2020-12-30 VITALS — BP 143/72 | HR 68 | Temp 98.3°F | Resp 16 | Ht 69.0 in | Wt 105.1 lb

## 2020-12-30 DIAGNOSIS — I252 Old myocardial infarction: Secondary | ICD-10-CM | POA: Diagnosis not present

## 2020-12-30 DIAGNOSIS — E538 Deficiency of other specified B group vitamins: Secondary | ICD-10-CM | POA: Diagnosis not present

## 2020-12-30 DIAGNOSIS — R5383 Other fatigue: Secondary | ICD-10-CM | POA: Diagnosis not present

## 2020-12-30 DIAGNOSIS — C180 Malignant neoplasm of cecum: Secondary | ICD-10-CM

## 2020-12-30 DIAGNOSIS — I10 Essential (primary) hypertension: Secondary | ICD-10-CM | POA: Diagnosis not present

## 2020-12-30 DIAGNOSIS — Z8249 Family history of ischemic heart disease and other diseases of the circulatory system: Secondary | ICD-10-CM | POA: Diagnosis not present

## 2020-12-30 DIAGNOSIS — Z79899 Other long term (current) drug therapy: Secondary | ICD-10-CM

## 2020-12-30 DIAGNOSIS — D561 Beta thalassemia: Secondary | ICD-10-CM | POA: Diagnosis not present

## 2020-12-30 MED ORDER — CYANOCOBALAMIN 1000 MCG/ML IJ SOLN
1000.0000 ug | INTRAMUSCULAR | Status: DC
  Administered 2020-12-30: 1000 ug via INTRAMUSCULAR

## 2020-12-30 NOTE — Progress Notes (Signed)
[place] good, eating well she states

## 2020-12-30 NOTE — Progress Notes (Signed)
Hematology/Oncology Consult note [facility]  [phone] [phone]  Patient Care Team: Juline Patch, MD as PCP - General (Family Medicine) Corey Skains, MD as Consulting Physician (Cardiology) Clent Jacks, RN as Oncology Nurse Navigator Ronny Bacon, MD as Consulting Physician (General Surgery) Sindy Guadeloupe, MD as Consulting Physician (Oncology)   Name of the patient: Phyllis Ross  [id number]  01-12-1936   Date of visit: 12/30/20  Diagnosis-stage III adenocarcinoma of the cecum  Chief complaint/ Reason for visit-routine follow-up of cecal cancer on Xeloda  Heme/Onc history:  Patient is a 85 year old female who was 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  She is s/p laparoscopic right hemicolectomy.  Pathology showed 5.2 x 4.3 x 2.7 cm grade 2 moderately differentiated adenocarcinoma with invasion through muscularis propria into pericolorectal tissue.  7 of 27 lymph nodes positive.  Lymphovascular invasion present but no perineural invasion.  MMR negative.  T3N2BMX.  Right hemicolectomy complicated by anastomotic breakdown/leak which was treated with antibiotics.  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  Also has a history of baseline microcytic anemia secondary to beta thalassemia and baseline hemoglobin is between 10-11.   Patient has met with Dr. Mike Gip in the past management chemotherapy options including oral Xeloda has been offered to the patient   Patient started adjuvant Xeloda on 08/23/2020. It was held after 4 cycles due to worsening anemia   Interval history-patient was supposed to start her next cycle after seeing me today.  However she was confused when she received her new shipment and took a few pills during her off week.  She is otherwise doing well and  appetite is good.  Weight has remained stable.  Energy levels are improving.  Denies any specific side effects from Xeloda  ECOG PS- 1 Pain scale- 0   Review of systems- Review of Systems  Constitutional:  Positive for malaise/fatigue. Negative for chills, fever and weight loss.  HENT:  Negative for congestion, ear discharge and nosebleeds.   Eyes:  Negative for blurred vision.  Respiratory:  Negative for cough, hemoptysis, sputum production, shortness of breath and wheezing.   Cardiovascular:  Negative for chest pain, palpitations, orthopnea and claudication.  Gastrointestinal:  Negative for abdominal pain, blood in stool, constipation, diarrhea, heartburn, melena, nausea and vomiting.  Genitourinary:  Negative for dysuria, flank pain, frequency, hematuria and urgency.  Musculoskeletal:  Negative for back pain, joint pain and myalgias.  Skin:  Negative for rash.  Neurological:  Negative for dizziness, tingling, focal weakness, seizures, weakness and headaches.  Endo/Heme/Allergies:  Does not bruise/bleed easily.  Psychiatric/Behavioral:  Negative for depression and suicidal ideas. The patient does not have insomnia.       Allergies  Allergen Reactions   Trazodone And Nefazodone Other (See Comments)    Made her pass out     Past Medical History:  Diagnosis Date   Anemia    Anxiety    Cancer ([place])    Hyperlipidemia    Hypertension    MI (myocardial infarction) ([place]) 2008   triple bypass   Osteoporosis    Pneumonia    as a baby   Rheumatic fever      Past Surgical History:  Procedure Laterality Date   COLONOSCOPY  2011   normal- Dr Vira Agar   COLONOSCOPY WITH PROPOFOL N/A 06/04/2020  Procedure: COLONOSCOPY WITH PROPOFOL;  Surgeon: Lucilla Lame, MD;  Location: [place];  Service: Endoscopy;  Laterality: N/A;   CORONARY ARTERY BYPASS GRAFT     ESOPHAGOGASTRODUODENOSCOPY (EGD) WITH PROPOFOL N/A 06/04/2020   Procedure: ESOPHAGOGASTRODUODENOSCOPY (EGD) WITH  PROPOFOL;  Surgeon: Lucilla Lame, MD;  Location: [place];  Service: Endoscopy;  Laterality: N/A;   VAGINAL HYSTERECTOMY     DUB    Social History   Socioeconomic History   Marital status: Married    Spouse name: Not on file   Number of children: 2   Years of education: Not on file   Highest education level: Some college, no degree  Occupational History    Employer: RETIRED  Tobacco Use   Smoking status: Never   Smokeless tobacco: Never  Vaping Use   Vaping Use: Never used  Substance and Sexual Activity   Alcohol use: No   Drug use: No   Sexual activity: Yes  Other Topics Concern   Not on file  Social History Narrative   Not on file   Social Determinants of Health   Financial Resource Strain: Low Risk    Difficulty of Paying Living Expenses: Not hard at all  Food Insecurity: No Food Insecurity   Worried About Charity fundraiser in the Last Year: Never true   [place] in the Last Year: Never true  Transportation Needs: No Transportation Needs   Lack of Transportation (Medical): No   Lack of Transportation (Non-Medical): No  Physical Activity: Sufficiently Active   Days of Exercise per Week: 6 days   Minutes of Exercise per Session: 40 min  Stress: No Stress Concern Present   Feeling of Stress : Not at all  Social Connections: Socially Integrated   Frequency of Communication with Friends and Family: More than three times a week   Frequency of Social Gatherings with Friends and Family: More than three times a week   Attends Religious Services: More than 4 times per year   Active Member of [employer] or Organizations: Yes   Attends Music therapist: More than 4 times per year   Marital Status: Married  Human resources officer Violence: Not At Risk   Fear of Current or Ex-Partner: No   Emotionally Abused: No   Physically Abused: No   Sexually Abused: No    Family History  Problem Relation Age of Onset   Heart attack Mother    Heart attack  Maternal Uncle    Heart attack Maternal Uncle      Current Outpatient Medications:    ALPRAZolam (XANAX) 0.25 MG tablet, Take 1 tablet (0.25 mg total) by mouth daily as needed for anxiety. (Patient not taking: No sig reported), Disp: 30 tablet, Rfl: 5   atorvastatin (LIPITOR) 80 MG tablet, 1 tablet daily, Disp: 90 tablet, Rfl: 1   capecitabine (XELODA) 500 MG tablet, Take 2 tablets (1035m) by mouth in AM and 1 tablet (5078m in PM. Take with food. Take for 14 days, then hold for 7 days. Repeat every 21 days., Disp: 42 tablet, Rfl: 0   ferrous sulfate 325 (65 FE) MG tablet, Take 325 mg by mouth daily with breakfast., Disp: , Rfl:    lisinopril (ZESTRIL) 10 MG tablet, TAKE ONE TABLET BY MOUTH TWICE DAILY (Patient not taking: Reported on 11/25/2020), Disp: 180 tablet, Rfl: 1   loperamide (IMODIUM) 2 MG capsule, Take 2 mg by mouth as needed for diarrhea or loose stools., Disp: , Rfl:  megestrol (MEGACE) 40 MG tablet, Take 1 tablet (40 mg total) by mouth 2 (two) times daily., Disp: 60 tablet, Rfl: 1   ondansetron (ZOFRAN ODT) 4 MG disintegrating tablet, Take 1 tablet (4 mg total) by mouth every 8 (eight) hours as needed for nausea or vomiting. (Patient not taking: No sig reported), Disp: 30 tablet, Rfl: 0   potassium chloride SA (KLOR-CON) 20 MEQ tablet, Take 1 tablet (20 mEq total) by mouth daily., Disp: 30 tablet, Rfl: 0   Urea 10 % OINT, Apply 1 Dose topically in the morning and at bedtime. To hands and feet from side effects of xeloda (Patient not taking: No sig reported), Disp: 454 g, Rfl: 0  Physical exam:  Vitals:   12/30/20 1315  BP: (!) 143/72  Pulse: 68  Resp: 16  Temp: 98.3 F (36.8 C)  TempSrc: Tympanic  Weight: 105 lb 1.6 oz (47.7 kg)  Height: 5' 9"  (1.753 m)   Physical Exam Constitutional:      Comments: Sitting in a wheelchair.  Appears in no acute distress  Cardiovascular:     Rate and Rhythm: Normal rate and regular rhythm.     Heart sounds: Normal heart sounds.   Pulmonary:     Effort: Pulmonary effort is normal.     Breath sounds: Normal breath sounds.  Abdominal:     General: Bowel sounds are normal.     Palpations: Abdomen is soft.  Skin:    General: Skin is warm and dry.  Neurological:     Mental Status: She is alert and oriented to person, place, and time.     CMP Latest Ref Rng & Units 12/29/2020  Glucose 70 - 99 mg/dL 123(H)  BUN 8 - 23 mg/dL 20  Creatinine 0.44 - 1.00 mg/dL 0.98  Sodium 135 - 145 mmol/L 136  Potassium 3.5 - 5.1 mmol/L 3.6  Chloride 98 - 111 mmol/L 105  CO2 22 - 32 mmol/L 25  Calcium 8.9 - 10.3 mg/dL 9.0  Total Protein 6.5 - 8.1 g/dL 7.6  Total Bilirubin 0.3 - 1.2 mg/dL 1.7(H)  Alkaline Phos 38 - 126 U/L 55  AST 15 - 41 U/L 29  ALT 0 - 44 U/L 35   CBC Latest Ref Rng & Units 12/29/2020  WBC 4.0 - 10.5 K/uL 7.6  Hemoglobin 12.0 - 15.0 g/dL 9.4(L)  Hematocrit 36.0 - 46.0 % 28.5(L)  Platelets 150 - 400 K/uL 314     Assessment and plan- Patient is a 85 y.o. female with stage IIIc adenocarcinoma of the colon s/p 5 cycles of adjuvant Xeloda here for routine follow-up  Patient had significant anemia when she was taking Xeloda at 1000 mg in the morning and 500 mg in the evening and therefore Xeloda was held for 1 cycle.  It was restarted at a lower dose of 500 mg twice daily.Today her hemoglobin is remaining stable overall around 9.4.  She is tolerating it well without any significant side effects.  Her baseline hemoglobin prior to her colon surgery was around 11.  She will continue Xeloda at present dose to complete 8 cycles and this will be the start of her sixth cycle.  I will see her back in 3 weeks for a video visit.  She will get labs prior and [place].  We will call her daughter and see how many tablets she has remaining for the new cycle and she will take it accordingly  Patient has baseline microcytosis secondary to beta thalassemia  History of B12  deficiency: We will receive B12 injection today   Visit  Diagnosis 1. High risk medication use   2. Cecal cancer ([place])      Dr. Randa Evens, MD, MPH [facility] at [facility] [phone] 12/30/2020 1:05 PM

## 2020-12-30 NOTE — Telephone Encounter (Signed)
Pthad showed [place] in clinic that she had only taken 2 tablets in am and 1 in pm 9/19, then took 2 in am 9/20 and then no more after that. I checked her med box that she brought in and she only has 24 tablets. So she will need to take 1 pill in am and 1 pill in pm. Until she finishes which will take 12 days starting tomorrow. I tried her cell phone and no answer and I left message and I also told her daughter what has happened and she said that she put all the meds in her med box so all she has was in there. I told the daughter Colletta Maryland that I would get up with her and let her know the plan. I also will send my chart about so Colletta Maryland in the loop since she is out of town now

## 2020-12-31 ENCOUNTER — Other Ambulatory Visit (HOSPITAL_COMMUNITY): Payer: Self-pay

## 2021-01-03 ENCOUNTER — Encounter: Payer: Self-pay | Admitting: Oncology

## 2021-01-04 ENCOUNTER — Other Ambulatory Visit (HOSPITAL_COMMUNITY): Payer: Self-pay

## 2021-01-04 ENCOUNTER — Other Ambulatory Visit: Payer: Self-pay | Admitting: Oncology

## 2021-01-04 DIAGNOSIS — C180 Malignant neoplasm of cecum: Secondary | ICD-10-CM

## 2021-01-04 MED ORDER — CAPECITABINE 500 MG PO TABS
ORAL_TABLET | ORAL | 0 refills | Status: DC
  Filled 2021-01-04: qty 42, 21d supply, fill #0

## 2021-01-05 ENCOUNTER — Other Ambulatory Visit (HOSPITAL_COMMUNITY): Payer: Self-pay

## 2021-01-14 ENCOUNTER — Encounter: Payer: Self-pay | Admitting: Oncology

## 2021-01-14 ENCOUNTER — Inpatient Hospital Stay: Payer: Medicare PPO | Attending: Oncology

## 2021-01-14 ENCOUNTER — Ambulatory Visit: Payer: Medicare PPO

## 2021-01-14 ENCOUNTER — Other Ambulatory Visit: Payer: Self-pay

## 2021-01-14 ENCOUNTER — Other Ambulatory Visit (HOSPITAL_COMMUNITY): Payer: Self-pay

## 2021-01-14 DIAGNOSIS — C180 Malignant neoplasm of cecum: Secondary | ICD-10-CM | POA: Diagnosis not present

## 2021-01-14 DIAGNOSIS — E538 Deficiency of other specified B group vitamins: Secondary | ICD-10-CM | POA: Insufficient documentation

## 2021-01-14 LAB — CBC WITH DIFFERENTIAL/PLATELET
Abs Immature Granulocytes: 0.04 10*3/uL (ref 0.00–0.07)
Basophils Absolute: 0.1 10*3/uL (ref 0.0–0.1)
Basophils Relative: 1 %
Eosinophils Absolute: 0.2 10*3/uL (ref 0.0–0.5)
Eosinophils Relative: 2 %
HCT: 27.1 % — ABNORMAL LOW (ref 36.0–46.0)
Hemoglobin: 8.8 g/dL — ABNORMAL LOW (ref 12.0–15.0)
Immature Granulocytes: 0 %
Lymphocytes Relative: 27 %
Lymphs Abs: 2.5 10*3/uL (ref 0.7–4.0)
MCH: 23.5 pg — ABNORMAL LOW (ref 26.0–34.0)
MCHC: 32.5 g/dL (ref 30.0–36.0)
MCV: 72.5 fL — ABNORMAL LOW (ref 80.0–100.0)
Monocytes Absolute: 0.9 10*3/uL (ref 0.1–1.0)
Monocytes Relative: 9 %
Neutro Abs: 5.6 10*3/uL (ref 1.7–7.7)
Neutrophils Relative %: 61 %
Platelets: 312 10*3/uL (ref 150–400)
RBC: 3.74 MIL/uL — ABNORMAL LOW (ref 3.87–5.11)
RDW: 19.6 % — ABNORMAL HIGH (ref 11.5–15.5)
WBC: 9.3 10*3/uL (ref 4.0–10.5)
nRBC: 0 % (ref 0.0–0.2)

## 2021-01-14 LAB — COMPREHENSIVE METABOLIC PANEL
ALT: 35 U/L (ref 0–44)
AST: 33 U/L (ref 15–41)
Albumin: 3.9 g/dL (ref 3.5–5.0)
Alkaline Phosphatase: 54 U/L (ref 38–126)
Anion gap: 6 (ref 5–15)
BUN: 21 mg/dL (ref 8–23)
CO2: 26 mmol/L (ref 22–32)
Calcium: 9.1 mg/dL (ref 8.9–10.3)
Chloride: 107 mmol/L (ref 98–111)
Creatinine, Ser: 0.88 mg/dL (ref 0.44–1.00)
GFR, Estimated: >60 mL/min (ref >60)
Glucose, Bld: 91 mg/dL (ref 70–99)
Potassium: 3.7 mmol/L (ref 3.5–5.1)
Sodium: 139 mmol/L (ref 135–145)
Total Bilirubin: 1.6 mg/dL — ABNORMAL HIGH (ref 0.3–1.2)
Total Protein: 7.4 g/dL (ref 6.5–8.1)

## 2021-01-14 MED ORDER — CYANOCOBALAMIN 1000 MCG/ML IJ SOLN
1000.0000 ug | INTRAMUSCULAR | Status: DC
  Administered 2021-01-14: 1000 ug via INTRAMUSCULAR
  Filled 2021-01-14: qty 1

## 2021-01-15 ENCOUNTER — Encounter: Payer: Self-pay | Admitting: Oncology

## 2021-01-15 ENCOUNTER — Inpatient Hospital Stay (HOSPITAL_BASED_OUTPATIENT_CLINIC_OR_DEPARTMENT_OTHER): Payer: Medicare PPO | Admitting: Oncology

## 2021-01-15 DIAGNOSIS — C180 Malignant neoplasm of cecum: Secondary | ICD-10-CM | POA: Diagnosis not present

## 2021-01-15 DIAGNOSIS — E538 Deficiency of other specified B group vitamins: Secondary | ICD-10-CM

## 2021-01-15 LAB — CEA: CEA: 5.4 ng/mL — ABNORMAL HIGH (ref 0.0–4.7)

## 2021-01-15 NOTE — Progress Notes (Signed)
Patient called/ pre- screened for virtual appoinment today with oncologist. No new concerns  

## 2021-01-19 ENCOUNTER — Other Ambulatory Visit: Payer: Medicare PPO

## 2021-01-19 ENCOUNTER — Ambulatory Visit: Payer: Medicare PPO

## 2021-01-20 ENCOUNTER — Telehealth: Payer: Medicare PPO | Admitting: Oncology

## 2021-01-21 ENCOUNTER — Encounter: Payer: Self-pay | Admitting: Oncology

## 2021-01-21 NOTE — Progress Notes (Signed)
I connected with Phyllis Ross on 01/21/21 at  1:45 PM EDT by telephone visit and verified that I am speaking with the correct person using two identifiers.   I discussed the limitations, risks, security and privacy concerns of performing an evaluation and management service by telemedicine and the availability of in-person appointments. I also discussed with the patient that there may be a patient responsible charge related to this service. The patient expressed understanding and agreed to proceed.  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  She is s/p laparoscopic right hemicolectomy.  Pathology showed 5.2 x 4.3 x 2.7 cm grade 2 moderately differentiated adenocarcinoma with invasion through muscularis propria into pericolorectal tissue.  7 of 27 lymph nodes positive.  Lymphovascular invasion present but no perineural invasion.  MMR negative.  [id number].  Right hemicolectomy complicated by anastomotic breakdown/leak which was treated with antibiotics.  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  Also has a history of baseline microcytic anemia secondary to beta thalassemia and baseline hemoglobin is between 10-11.   Patient has met with Dr. Corcoran in the past management chemotherapy options including oral Xeloda has been offered to the patient   Patient started adjuvant Xeloda on 08/23/2020. It was held after 4 cycles due to worsening anemia    Interval history reports tolerating Xeloda well.  Energy levels are improving appetite is better.  Denies any nausea vomiting or  diarrhea   Review of Systems  Constitutional:  Positive for malaise/fatigue. Negative for chills, fever and weight loss.  HENT:  Negative for congestion, ear discharge and nosebleeds.   Eyes:  Negative for blurred vision.  Respiratory:  Negative for cough, hemoptysis, sputum production, shortness of breath and wheezing.   Cardiovascular:  Negative for chest pain, palpitations, orthopnea and claudication.  Gastrointestinal:  Negative for abdominal pain, blood in stool, constipation, diarrhea, heartburn, melena, nausea and vomiting.  Genitourinary:  Negative for dysuria, flank pain, frequency, hematuria and urgency.  Musculoskeletal:  Negative for back pain, joint pain and myalgias.  Skin:  Negative for rash.  Neurological:  Negative for dizziness, tingling, focal weakness, seizures, weakness and headaches.  Endo/Heme/Allergies:  Does not bruise/bleed easily.  Psychiatric/Behavioral:  Negative for depression and suicidal ideas. The patient does not have insomnia.    Allergies  Allergen Reactions   Trazodone And Nefazodone Other (See Comments)    Made her pass out    Past Medical History:  Diagnosis Date   Anemia    Anxiety    Cancer (HCC)    Hyperlipidemia    Hypertension    MI (myocardial infarction) (HCC) 2008   triple bypass   Osteoporosis    Pneumonia    as a baby   Rheumatic fever     Past Surgical History:  Procedure Laterality Date   COLONOSCOPY  2011   normal- Dr Elliott   COLONOSCOPY WITH PROPOFOL N/A 06/04/2020   Procedure: COLONOSCOPY WITH PROPOFOL;  Surgeon: Wohl, Darren, MD;  Location: [facility];  Service: Endoscopy;  Laterality: N/A;   CORONARY ARTERY BYPASS GRAFT     ESOPHAGOGASTRODUODENOSCOPY (EGD) WITH PROPOFOL N/A 06/04/2020     Procedure: ESOPHAGOGASTRODUODENOSCOPY (EGD) WITH PROPOFOL;  Surgeon: Wohl, Darren, MD;  Location: [facility];  Service: Endoscopy;  Laterality: N/A;   VAGINAL HYSTERECTOMY     DUB    Social History    Socioeconomic History   Marital status: Married    Spouse name: Not on file   Number of children: 2   Years of education: Not on file   Highest education level: Some college, no degree  Occupational History    Employer: RETIRED  Tobacco Use   Smoking status: Never   Smokeless tobacco: Never  Vaping Use   Vaping Use: Never used  Substance and Sexual Activity   Alcohol use: No   Drug use: No   Sexual activity: Yes  Other Topics Concern   Not on file  Social History Narrative   Not on file   Social Determinants of Health   Financial Resource Strain: Low Risk    Difficulty of Paying Living Expenses: Not hard at all  Food Insecurity: No Food Insecurity   Worried About Running Out of Food in the Last Year: Never true   Ran Out of Food in the Last Year: Never true  Transportation Needs: No Transportation Needs   Lack of Transportation (Medical): No   Lack of Transportation (Non-Medical): No  Physical Activity: Sufficiently Active   Days of Exercise per Week: 6 days   Minutes of Exercise per Session: 40 min  Stress: No Stress Concern Present   Feeling of Stress : Not at all  Social Connections: Socially Integrated   Frequency of Communication with Friends and Family: More than three times a week   Frequency of Social Gatherings with Friends and Family: More than three times a week   Attends Religious Services: More than 4 times per year   Active Member of Clubs or Organizations: Yes   Attends Club or Organization Meetings: More than 4 times per year   Marital Status: Married  Intimate Partner Violence: Not At Risk   Fear of Current or Ex-Partner: No   Emotionally Abused: No   Physically Abused: No   Sexually Abused: No    Family History  Problem Relation Age of Onset   Heart attack Mother    Heart attack Maternal Uncle    Heart attack Maternal Uncle      Current Outpatient Medications:    atorvastatin (LIPITOR) 80 MG tablet, 1 tablet daily, Disp: 90 tablet,  Rfl: 1   capecitabine (XELODA) 500 MG tablet, Take 2 tablets (1000mg) by mouth in AM and 1 tablet (500mg) in PM. Take with food. Take for 14 days, then hold for 7 days. Repeat every 21 days., Disp: 42 tablet, Rfl: 0   ferrous sulfate 325 (65 FE) MG tablet, Take 325 mg by mouth daily with breakfast., Disp: , Rfl:    potassium chloride SA (KLOR-CON) 20 MEQ tablet, Take 1 tablet (20 mEq total) by mouth daily., Disp: 30 tablet, Rfl: 0   ALPRAZolam (XANAX) 0.25 MG tablet, Take 1 tablet (0.25 mg total) by mouth daily as needed for anxiety. (Patient not taking: No sig reported), Disp: 30 tablet, Rfl: 5   loperamide (IMODIUM) 2 MG capsule, Take 2 mg by mouth as needed for diarrhea or loose stools. (Patient not taking: No sig reported), Disp: , Rfl:    megestrol (MEGACE) 40 MG tablet, Take 1 tablet (40 mg total) by mouth 2 (two) times daily. (Patient not taking: No sig reported), Disp: 60 tablet, Rfl: 1   ondansetron (ZOFRAN ODT) 4   MG disintegrating tablet, Take 1 tablet (4 mg total) by mouth every 8 (eight) hours as needed for nausea or vomiting. (Patient not taking: No sig reported), Disp: 30 tablet, Rfl: 0   Urea 10 % OINT, Apply 1 Dose topically in the morning and at bedtime. To hands and feet from side effects of xeloda (Patient not taking: No sig reported), Disp: 454 g, Rfl: 0  No results found.  No images are attached to the encounter.   CMP Latest Ref Rng & Units 01/14/2021  Glucose 70 - 99 mg/dL 91  BUN 8 - 23 mg/dL 21  Creatinine 0.44 - 1.00 mg/dL 0.88  Sodium 135 - 145 mmol/L 139  Potassium 3.5 - 5.1 mmol/L 3.7  Chloride 98 - 111 mmol/L 107  CO2 22 - 32 mmol/L 26  Calcium 8.9 - 10.3 mg/dL 9.1  Total Protein 6.5 - 8.1 g/dL 7.4  Total Bilirubin 0.3 - 1.2 mg/dL 1.6(H)  Alkaline Phos 38 - 126 U/L 54  AST 15 - 41 U/L 33  ALT 0 - 44 U/L 35   CBC Latest Ref Rng & Units 01/14/2021  WBC 4.0 - 10.5 K/uL 9.3  Hemoglobin 12.0 - 15.0 g/dL 8.8(L)  Hematocrit 36.0 - 46.0 % 27.1(L)  Platelets 150  - 400 K/uL 312       Assessment and plan: Patient is a 85 year old female with history of stage IIIc adenocarcinoma of the colon s/p 6 cycles of adjuvant Xeloda and this is a routine follow-up visit.    Clinically patient is doing well and tolerating Xeloda without any significant side effects.  Plan is to complete 8 cycles.I will see her back on 02/05/2021 in [place] before the start of cycle 8.  Follow-up instructions: As above  I discussed the assessment and treatment plan with the patient. The patient was provided an opportunity to ask questions and all were answered. The patient agreed with the plan and demonstrated an understanding of the instructions.   The patient was advised to call back or seek an in-person evaluation if the symptoms worsen or if the condition fails to improve as anticipated.  I provided 11 minutes of non face-to-face telephone visit time during this encounter, and > 50% was spent counseling as documented under my assessment & plan.  Visit Diagnosis: 1. B12 deficiency   2. Cecal cancer ([place])     Dr. Randa Evens, MD, MPH [facility] Tel- [phone] 01/21/2021 1:56 PM

## 2021-01-22 ENCOUNTER — Other Ambulatory Visit: Payer: Self-pay | Admitting: Pharmacist

## 2021-01-22 DIAGNOSIS — E876 Hypokalemia: Secondary | ICD-10-CM

## 2021-01-22 MED ORDER — POTASSIUM CHLORIDE CRYS ER 20 MEQ PO TBCR: 20.0000 meq | EXTENDED_RELEASE_TABLET | Freq: Every day | ORAL | 0 refills | Status: DC

## 2021-01-25 ENCOUNTER — Other Ambulatory Visit (HOSPITAL_COMMUNITY): Payer: Self-pay

## 2021-01-25 ENCOUNTER — Other Ambulatory Visit: Payer: Self-pay

## 2021-01-25 ENCOUNTER — Telehealth: Payer: Self-pay | Admitting: *Deleted

## 2021-01-25 ENCOUNTER — Telehealth: Payer: Self-pay

## 2021-01-25 ENCOUNTER — Ambulatory Visit
Admission: RE | Admit: 2021-01-25 | Discharge: 2021-01-25 | Disposition: A | Discharge status: Home / Self Care | Payer: Medicare PPO | Source: Ambulatory Visit | Attending: Family Medicine | Admitting: Family Medicine

## 2021-01-25 ENCOUNTER — Encounter: Payer: Self-pay | Admitting: Family Medicine

## 2021-01-25 ENCOUNTER — Ambulatory Visit
Admission: RE | Admit: 2021-01-25 | Discharge: 2021-01-25 | Disposition: A | Discharge status: Home / Self Care | Payer: Medicare PPO | Attending: Family Medicine | Admitting: Family Medicine

## 2021-01-25 ENCOUNTER — Ambulatory Visit: Payer: Medicare PPO | Admitting: Family Medicine

## 2021-01-25 VITALS — BP 120/70 | HR 78 | Ht 69.0 in | Wt 106.0 lb

## 2021-01-25 DIAGNOSIS — R0789 Other chest pain: Secondary | ICD-10-CM | POA: Diagnosis not present

## 2021-01-25 DIAGNOSIS — R0781 Pleurodynia: Secondary | ICD-10-CM | POA: Insufficient documentation

## 2021-01-25 DIAGNOSIS — J984 Other disorders of lung: Secondary | ICD-10-CM | POA: Diagnosis not present

## 2021-01-25 DIAGNOSIS — Z951 Presence of aortocoronary bypass graft: Secondary | ICD-10-CM | POA: Diagnosis not present

## 2021-01-25 NOTE — Telephone Encounter (Signed)
Daughter Colletta Maryland called reporting that patient is having abdominal pain which patient states she has had prior to her colon surgery in March of this year. She has an appointment with her PCP right now and Colletta Maryland was asking if we need to see her also since she is on Xeloda, of which the patient is haivng no problems taking or side effects. I advised that since she is at her PCP office lets see what they say and if needed, we can get her in to be seen in our office. She agreed to this and I asked that she call me back either way to let me know what her PCP says. She agreed to call me back

## 2021-01-25 NOTE — Addendum Note (Signed)
Addended by: Fredderick Severance on: 01/25/2021 03:01 PM   Modules accepted: Orders

## 2021-01-25 NOTE — Progress Notes (Signed)
Date:  01/25/2021   Name:  Phyllis Ross   DOB:  May 25, 1935   MRN:  [id number]   Chief Complaint: pain in rib (L) ribs- had pain yesterday that was sharp and "lasted longer than it normally does". Prob x 10 -15 minutes. Having this for about a year)  Chest Pain  This is a recurrent problem. The current episode started more than 1 month ago. Onset quality: worse yesterday. The problem occurs intermittently. The problem has been waxing and waning. The pain is present in the lateral region. The pain is at a severity of 7/10. The pain is moderate. The quality of the pain is described as sharp. The pain does not radiate. Pertinent negatives include no abdominal pain, back pain, palpitations or shortness of breath.   Lab Results  Component Value Date   CREATININE 0.88 01/14/2021   BUN 21 01/14/2021   NA 139 01/14/2021   K 3.7 01/14/2021   CL 107 01/14/2021   CO2 26 01/14/2021   Lab Results  Component Value Date   CHOL 118 10/07/2020   HDL 43 10/07/2020   LDLCALC 53 10/07/2020   TRIG 109 10/07/2020   CHOLHDL 2.7 10/07/2020   No results found for: TSH Lab Results  Component Value Date   HGBA1C 5.5 04/07/2020   Lab Results  Component Value Date   WBC 9.3 01/14/2021   HGB 8.8 (L) 01/14/2021   HCT 27.1 (L) 01/14/2021   MCV 72.5 (L) 01/14/2021   PLT 312 01/14/2021   Lab Results  Component Value Date   ALT 35 01/14/2021   AST 33 01/14/2021   ALKPHOS 54 01/14/2021   BILITOT 1.6 (H) 01/14/2021     Review of Systems  Respiratory:  Negative for shortness of breath.   Cardiovascular:  Positive for chest pain. Negative for palpitations.  Gastrointestinal:  Negative for abdominal pain.  Musculoskeletal:  Negative for back pain.   Patient Active Problem List   Diagnosis Date Noted   B12 deficiency 09/16/2020   Goals of care, counseling/discussion 08/07/2020   Microcytic anemia 07/23/2020   Status post partial colectomy 07/02/2020   Protein-calorie malnutrition, severe  06/24/2020   Cecal cancer ([place]) 06/15/2020   Iron deficiency anemia due to chronic blood loss    Neoplasm of lower gastrointestinal tract    Acute gastritis with hemorrhage    Personal history of other malignant neoplasm of skin 01/21/2019   Atherosclerosis of abdominal aorta ([place]) 12/26/2017   Age-related osteoporosis without current pathological fracture 06/16/2016   Encounter for follow-up surveillance of ovarian cancer 05/20/2015   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   Wedging of vertebra ([place]) 10/13/2014   Polypharmacy 10/13/2014   Encounter for general adult medical examination without abnormal findings 10/13/2014   Anxiety 10/13/2014   Essential hypertension 08/27/2014   Bradycardia 08/27/2014   Coronary artery disease of native heart with stable angina pectoris ([place]) 08/27/2014   MI (mitral incompetence) 08/27/2014   TI (tricuspid incompetence) 08/27/2014   Combined fat and carbohydrate induced hyperlipemia 08/13/2014   HTN (hypertension), malignant 08/11/2014   Chest pain 08/11/2014   CAD (coronary artery disease) of artery bypass graft 08/11/2014    Allergies  Allergen Reactions   Trazodone And Nefazodone Other (See Comments)    Made her pass out    Past Surgical History:  Procedure Laterality Date   COLONOSCOPY  2011   normal- Dr Vira Agar   COLONOSCOPY WITH PROPOFOL N/A 06/04/2020   Procedure: COLONOSCOPY WITH PROPOFOL;  Surgeon: Lucilla Lame,  MD;  Location: [facility];  Service: Endoscopy;  Laterality: N/A;   CORONARY ARTERY BYPASS GRAFT     ESOPHAGOGASTRODUODENOSCOPY (EGD) WITH PROPOFOL N/A 06/04/2020   Procedure: ESOPHAGOGASTRODUODENOSCOPY (EGD) WITH PROPOFOL;  Surgeon: Lucilla Lame, MD;  Location: [facility];  Service: Endoscopy;  Laterality: N/A;   VAGINAL HYSTERECTOMY     DUB    Social History   Tobacco Use   Smoking status: Never   Smokeless tobacco: Never  Vaping Use   Vaping Use: Never used  Substance Use  Topics   Alcohol use: No   Drug use: No     Medication list has been reviewed and updated.  Current Meds  Medication Sig   atorvastatin (LIPITOR) 80 MG tablet 1 tablet daily   capecitabine (XELODA) 500 MG tablet Take 2 tablets (1000mg ) by mouth in AM and 1 tablet (500mg ) in PM. Take with food. Take for 14 days, then hold for 7 days. Repeat every 21 days.   ferrous sulfate 325 (65 FE) MG tablet Take 325 mg by mouth daily with breakfast.   potassium chloride SA (KLOR-CON) 20 MEQ tablet Take 1 tablet (20 mEq total) by mouth daily.    PHQ 2/9 Scores 07/20/2020 02/03/2020 09/24/2019 03/21/2019  PHQ - 2 Score 0 0 0 0  PHQ- 9 Score 5 - 0 0    GAD 7 : Generalized Anxiety Score 07/20/2020 09/24/2019 03/21/2019  Nervous, Anxious, on Edge 2 0 0  Control/stop worrying 2 0 0  Worry too much - different things 2 0 0  Trouble relaxing 0 0 0  Restless 0 0 0  Easily annoyed or irritable 1 0 0  Afraid - awful might happen 1 0 0  Total GAD 7 Score 8 0 0  Anxiety Difficulty Not difficult at all - -    BP Readings from Last 3 Encounters:  01/25/21 120/70  12/30/20 (!) 143/72  11/25/20 114/60    Physical Exam  Wt Readings from Last 3 Encounters:  01/25/21 106 lb (48.1 kg)  12/30/20 105 lb 1.6 oz (47.7 kg)  11/25/20 105 lb 4.3 oz (47.7 kg)    BP 120/70   Pulse 78   Ht 5\' 9"  (1.753 m)   Wt 106 lb (48.1 kg)   BMI 15.65 kg/m   Assessment and Plan:  1. Chest wall pain Chronic.  Waxes and wanes.  Yesterday had significant pain of 8/10 but this was after loading close in the washer or dryer.  Not associated with dyspnea nor cough.  2. Rib pain on left side Examination notes point tenderness in the left lateral rib area without palpable defect or crepitance.  Auscultation notes no pleural friction rub, decreased breath sounds, nor rales or rhonchi.  Point of tenderness was marked and we will get a unilateral rib view to rule out metastatic lesion or fracture. - DG Ribs Unilateral Right;  Future

## 2021-01-25 NOTE — Telephone Encounter (Signed)
Copied from [place] [phone]. Topic: General - Other >> Jan 25, 2021  1:10 PM Celene Kras wrote: Reason for CRM: Pts daughter, Colletta Maryland, calling on behalf of pt to speak with Baxter Flattery. Please advise.

## 2021-01-26 ENCOUNTER — Other Ambulatory Visit: Payer: Self-pay | Admitting: Family Medicine

## 2021-01-26 DIAGNOSIS — E7849 Other hyperlipidemia: Secondary | ICD-10-CM

## 2021-01-26 DIAGNOSIS — I700 Atherosclerosis of aorta: Secondary | ICD-10-CM | POA: Diagnosis not present

## 2021-01-26 DIAGNOSIS — I10 Essential (primary) hypertension: Secondary | ICD-10-CM | POA: Diagnosis not present

## 2021-01-26 DIAGNOSIS — I25810 Atherosclerosis of coronary artery bypass graft(s) without angina pectoris: Secondary | ICD-10-CM | POA: Diagnosis not present

## 2021-01-26 DIAGNOSIS — E782 Mixed hyperlipidemia: Secondary | ICD-10-CM | POA: Diagnosis not present

## 2021-01-26 NOTE — Telephone Encounter (Signed)
Refilled 10/06/2020 #90 with 1 refill. Patient should have enough medication to last until 04/2021.

## 2021-02-01 ENCOUNTER — Other Ambulatory Visit: Payer: Self-pay | Admitting: Oncology

## 2021-02-01 ENCOUNTER — Other Ambulatory Visit (HOSPITAL_COMMUNITY): Payer: Self-pay

## 2021-02-01 DIAGNOSIS — C180 Malignant neoplasm of cecum: Secondary | ICD-10-CM

## 2021-02-01 MED ORDER — CAPECITABINE 500 MG PO TABS
ORAL_TABLET | ORAL | 0 refills | Status: DC
Start: 2021-02-01 — End: 2021-03-19
  Filled 2021-02-01: qty 42, 21d supply, fill #0

## 2021-02-03 ENCOUNTER — Ambulatory Visit (INDEPENDENT_AMBULATORY_CARE_PROVIDER_SITE_OTHER): Payer: Medicare PPO

## 2021-02-03 ENCOUNTER — Other Ambulatory Visit: Payer: Self-pay

## 2021-02-03 VITALS — BP 110/60 | HR 66 | Temp 97.9°F | Resp 16 | Ht 69.0 in | Wt 106.6 lb

## 2021-02-03 DIAGNOSIS — Z0000 Encounter for general adult medical examination without abnormal findings: Secondary | ICD-10-CM

## 2021-02-03 DIAGNOSIS — Z23 Encounter for immunization: Secondary | ICD-10-CM

## 2021-02-03 NOTE — Progress Notes (Signed)
Subjective:   Phyllis Ross is a 85 y.o. female who presents for Medicare Annual (Subsequent) preventive examination.  Review of Systems     Cardiac Risk Factors include: advanced age (>58men, >34 women);dyslipidemia;hypertension     Objective:    Today's Vitals   02/03/21 1458  BP: 110/60  Pulse: 66  Resp: 16  Temp: 97.9 F (36.6 C)  TempSrc: Oral  SpO2: 99%  Weight: 106 lb 9.6 oz (48.4 kg)  Height: 5\' 9"  (1.753 m)   Body mass index is 15.74 kg/m.  Advanced Directives 02/03/2021 12/30/2020 11/25/2020 10/28/2020 10/07/2020 09/16/2020 06/15/2020  Does Patient Have a Medical Advance Directive? Yes Yes No Yes Yes No Yes  Type of Paramedic of [place];Living will [place];Living will - [place];Living will - - [place];Living will  Does patient want to make changes to medical advance directive? - No - Patient declined - No - Patient declined - - No - Guardian declined  Copy of [place] in Chart? No - copy requested No - copy requested - No - copy requested - - -  Would patient like information on creating a medical advance directive? - - No - Patient declined - - No - Patient declined -    Current Medications (verified) Outpatient Encounter Medications as of 02/03/2021  Medication Sig   ALPRAZolam (XANAX) 0.25 MG tablet Take 1 tablet (0.25 mg total) by mouth daily as needed for anxiety.   atorvastatin (LIPITOR) 80 MG tablet TAKE ONE TABLET BY MOUTH ONCE DAILY   capecitabine (XELODA) 500 MG tablet Take 2 tablets (1000mg ) by mouth in AM and 1 tablet (500mg ) in PM. Take with food. Take for 14 days, then hold for 7 days. Repeat every 21 days.   ferrous sulfate 325 (65 FE) MG tablet Take 325 mg by mouth daily with breakfast.   loperamide (IMODIUM) 2 MG capsule Take 2 mg by mouth as needed for diarrhea or loose stools.   megestrol (MEGACE) 40 MG tablet Take 1 tablet (40 mg total) by  mouth 2 (two) times daily.   ondansetron (ZOFRAN ODT) 4 MG disintegrating tablet Take 1 tablet (4 mg total) by mouth every 8 (eight) hours as needed for nausea or vomiting.   potassium chloride SA (KLOR-CON) 20 MEQ tablet Take 1 tablet (20 mEq total) by mouth daily.   Urea 10 % OINT Apply 1 Dose topically in the morning and at bedtime. To hands and feet from side effects of xeloda (Patient not taking: No sig reported)   No facility-administered encounter medications on file as of 02/03/2021.    Allergies (verified) Trazodone and nefazodone   History: Past Medical History:  Diagnosis Date   Anemia    Anxiety    Cancer ([place])    Hyperlipidemia    Hypertension    MI (myocardial infarction) ([place]) 2008   triple bypass   Osteoporosis    Pneumonia    as a baby   Rheumatic fever    Past Surgical History:  Procedure Laterality Date   COLONOSCOPY  2011   normal- Dr Vira Agar   COLONOSCOPY WITH PROPOFOL N/A 06/04/2020   Procedure: COLONOSCOPY WITH PROPOFOL;  Surgeon: Lucilla Lame, MD;  Location: [facility];  Service: Endoscopy;  Laterality: N/A;   CORONARY ARTERY BYPASS GRAFT     ESOPHAGOGASTRODUODENOSCOPY (EGD) WITH PROPOFOL N/A 06/04/2020   Procedure: ESOPHAGOGASTRODUODENOSCOPY (EGD) WITH PROPOFOL;  Surgeon: Lucilla Lame, MD;  Location: [facility];  Service: Endoscopy;  Laterality: N/A;   VAGINAL HYSTERECTOMY     DUB   Family History  Problem Relation Age of Onset   Heart attack Mother    Heart attack Maternal Uncle    Heart attack Maternal Uncle    Social History   Socioeconomic History   Marital status: Married    Spouse name: Not on file   Number of children: 2   Years of education: Not on file   Highest education level: Some college, no degree  Occupational History    Employer: RETIRED  Tobacco Use   Smoking status: Never   Smokeless tobacco: Never  Vaping Use   Vaping Use: Never used  Substance and Sexual Activity   Alcohol use: No   Drug use: No    Sexual activity: Yes  Other Topics Concern   Not on file  Social History Narrative   Not on file   Social Determinants of Health   Financial Resource Strain: Low Risk    Difficulty of Paying Living Expenses: Not hard at all  Food Insecurity: No Food Insecurity   Worried About Charity fundraiser in the Last Year: Never true   [place] in the Last Year: Never true  Transportation Needs: No Transportation Needs   Lack of Transportation (Medical): No   Lack of Transportation (Non-Medical): No  Physical Activity: Insufficiently Active   Days of Exercise per Week: 4 days   Minutes of Exercise per Session: 10 min  Stress: No Stress Concern Present   Feeling of Stress : Not at all  Social Connections: Socially Integrated   Frequency of Communication with Friends and Family: More than three times a week   Frequency of Social Gatherings with Friends and Family: More than three times a week   Attends Religious Services: More than 4 times per year   Active Member of [employer] or Organizations: Yes   Attends Music therapist: More than 4 times per year   Marital Status: Married    Tobacco Counseling Counseling given: Not Answered   Clinical Intake:  Pre-visit preparation completed: Yes  Pain : No/denies pain     BMI - recorded: 15.74 Nutritional Status: BMI <19  Underweight Nutritional Risks: None Diabetes: No  How often do you need to have someone help you when you read instructions, pamphlets, or other written materials from your doctor or pharmacy?: 1 - Never    Interpreter Needed?: No  Information entered by :: Clemetine Marker LPN   Activities of Daily Living In your present state of health, do you have any difficulty performing the following activities: 02/03/2021  Hearing? N  Vision? N  Difficulty concentrating or making decisions? N  Walking or climbing stairs? N  Dressing or bathing? N  Doing errands, shopping? N  Preparing Food and eating ? N   Using the Toilet? N  In the past six months, have you accidently leaked urine? N  Do you have problems with loss of bowel control? N  Managing your Medications? N  Managing your Finances? N  Housekeeping or managing your Housekeeping? N  Some recent data might be hidden    Patient Care Team: Juline Patch, MD as PCP - General (Family Medicine) Corey Skains, MD as Consulting Physician (Cardiology) Clent Jacks, RN as Oncology Nurse Navigator Ronny Bacon, MD as Consulting Physician (General Surgery) Sindy Guadeloupe, MD as Consulting Physician (Oncology)  Indicate any recent Medical Services you may have received from  other than [facility] providers in the past year (date may be approximate).     Assessment:   This is a routine wellness examination for Phyllis Ross.  Hearing/Vision screen Hearing Screening - Comments:: Pt denies hearing difficulty Vision Screening - Comments:: Annual vision screenings done by Dr. Matilde Sprang; due for exam  Dietary issues and exercise activities discussed: Current Exercise Habits: Home exercise routine, Time (Minutes): 15, Frequency (Times/Week): 4, Weekly Exercise (Minutes/Week): 60, Intensity: Mild, Exercise limited by: orthopedic condition(s)   Goals Addressed   None    Depression Screen PHQ 2/9 Scores 02/03/2021 07/20/2020 02/03/2020 09/24/2019 03/21/2019 07/18/2018 05/14/2018  PHQ - 2 Score 0 0 0 0 0 0 0  PHQ- 9 Score - 5 - 0 0 0 -    Fall Risk Fall Risk  02/03/2021 07/16/2020 06/09/2020 02/03/2020 09/24/2019  Falls in the past year? 0 0 0 0 0  Number falls in past yr: 0 - - 0 -  Injury with Fall? 0 - - 0 -  Risk for fall due to : No Fall Risks - - No Fall Risks -  Follow up Falls prevention discussed - - Falls prevention discussed Falls evaluation completed    FALL RISK PREVENTION PERTAINING TO THE HOME:  Any stairs in or around the home? No  If so, are there any without handrails? No  Home free of loose throw rugs in walkways, pet beds,  electrical cords, etc? Yes  Adequate lighting in your home to reduce risk of falls? Yes   ASSISTIVE DEVICES UTILIZED TO PREVENT FALLS:  Life alert? No  Use of a cane, walker or w/c? Yes  Grab bars in the bathroom? Yes  Shower chair or bench in shower? Yes  Elevated toilet seat or a handicapped toilet? Yes   TIMED UP AND GO:  Was the test performed? No . Pt remained in wheelchair  Cognitive Function: Normal cognitive status assessed by direct observation by this Nurse Health Advisor. No abnormalities found.       6CIT Screen 02/03/2020 05/14/2018 02/13/2017  What Year? 0 points 0 points 0 points  What month? 0 points 0 points 0 points  What time? 0 points 0 points 0 points  Count back from 20 0 points 0 points 0 points  Months in reverse 0 points 0 points 0 points  Repeat phrase 0 points 0 points 2 points  Total Score 0 0 2    Immunizations Immunization History  Administered Date(s) Administered   Fluad Quad(high Dose 65+) 02/03/2021   Influenza-Unspecified 01/03/2019   PFIZER(Purple Top)SARS-COV-2 Vaccination 05/29/2019, 06/19/2019, 03/14/2020   Tdap 06/16/2016    TDAP status: Up to date  Flu Vaccine status: Up to date  Pneumococcal vaccine status: Declined,  Education has been provided regarding the importance of this vaccine but patient still declined. Advised may receive this vaccine at local pharmacy or Health Dept. Aware to provide a copy of the vaccination record if obtained from local pharmacy or Health Dept. Verbalized acceptance and understanding.   Covid-19 vaccine status: Completed vaccines  Qualifies for Shingles Vaccine? Yes   Zostavax completed No   Shingrix Completed?: No.    Education has been provided regarding the importance of this vaccine. Patient has been advised to call insurance company to determine out of pocket expense if they have not yet received this vaccine. Advised may also receive vaccine at local pharmacy or Health Dept. Verbalized  acceptance and understanding.  Screening Tests Health Maintenance  Topic Date Due   Pneumonia Vaccine 65+ Years  old (1 - PCV) Never done   Zoster Vaccines- Shingrix (1 of 2) Never done   COVID-19 Vaccine (4 - Booster for Pfizer series) 05/09/2020   TETANUS/TDAP  06/17/2026   INFLUENZA VACCINE  Completed   HPV VACCINES  Aged Out   DEXA SCAN  Discontinued    Health Maintenance  Health Maintenance Due  Topic Date Due   Pneumonia Vaccine 15+ Years old (1 - PCV) Never done   Zoster Vaccines- Shingrix (1 of 2) Never done   COVID-19 Vaccine (4 - Booster for Pfizer series) 05/09/2020    Colorectal cancer screening: No longer required.   Mammogram status: No longer required due to age.  Bone Density status: Completed 06/11/18. Results reflect: Bone density results: OSTEOPENIA. Repeat every 2 years.  Lung Cancer Screening: (Low Dose CT Chest recommended if Age 67-80 years, 30 pack-year currently smoking OR have quit w/in 15years.) does not qualify.   Additional Screening:  Hepatitis C Screening: does not qualify  Vision Screening: Recommended annual ophthalmology exams for early detection of glaucoma and other disorders of the eye. Is the patient up to date with their annual eye exam?  No  Who is the provider or what is the name of the office in which the patient attends annual eye exams? Dr. Matilde Sprang.   Dental Screening: Recommended annual dental exams for proper oral hygiene  Community Resource Referral / Chronic Care Management: CRR required this visit?  No   CCM required this visit?  No      Plan:     I have personally reviewed and noted the following in the patient's chart:   Medical and social history Use of alcohol, tobacco or illicit drugs  Current medications and supplements including opioid prescriptions.  Functional ability and status Nutritional status Physical activity Advanced directives List of other physicians Hospitalizations, surgeries, and ER visits in  previous 12 months Vitals Screenings to include cognitive, depression, and falls Referrals and appointments  In addition, I have reviewed and discussed with patient certain preventive protocols, quality metrics, and best practice recommendations. A written personalized care plan for preventive services as well as general preventive health recommendations were provided to patient.     Clemetine Marker, LPN   [id number]   Nurse Notes: pt accompanied to visit today by daughter Phyllis Ross

## 2021-02-03 NOTE — Patient Instructions (Signed)
Phyllis Ross , Thank you for taking time to come for your Medicare Wellness Visit. I appreciate your ongoing commitment to your health goals. Please review the following plan we discussed and let me know if I can assist you in the future.   Screening recommendations/referrals: Colonoscopy: no longer required Mammogram: no longer required Bone Density: done 06/11/18 Recommended yearly ophthalmology/optometry visit for glaucoma screening and checkup Recommended yearly dental visit for hygiene and checkup  Vaccinations: Influenza vaccine: done today Pneumococcal vaccine: due Tdap vaccine: done 06/16/16 Shingles vaccine: Shingrix discussed. Please contact your pharmacy for coverage information.  Covid-19:done 05/29/19, 06/19/19 & 03/14/20  Advanced directives: Please bring a copy of your health care power of attorney and living will to the office at your convenience.   Conditions/risks identified: Recommend continuing fall prevention in the home  Next appointment: Follow up in one year for your annual wellness visit    Preventive Care 65 Years and Older, Female Preventive care refers to lifestyle choices and visits with your health care provider that can promote health and wellness. What does preventive care include? A yearly physical exam. This is also called an annual well check. Dental exams once or twice a year. Routine eye exams. Ask your health care provider how often you should have your eyes checked. Personal lifestyle choices, including: Daily care of your teeth and gums. Regular physical activity. Eating a healthy diet. Avoiding tobacco and drug use. Limiting alcohol use. Practicing safe sex. Taking low-dose aspirin every day. Taking vitamin and mineral supplements as recommended by your health care provider. What happens during an annual well check? The services and screenings done by your health care provider during your annual well check will depend on your age, overall  health, lifestyle risk factors, and family history of disease. Counseling  Your health care provider may ask you questions about your: Alcohol use. Tobacco use. Drug use. Emotional well-being. Home and relationship well-being. Sexual activity. Eating habits. History of falls. Memory and ability to understand (cognition). Work and work Statistician. Reproductive health. Screening  You may have the following tests or measurements: Height, weight, and BMI. Blood pressure. Lipid and cholesterol levels. These may be checked every 5 years, or more frequently if you are over 76 years old. Skin check. Lung cancer screening. You may have this screening every year starting at age 28 if you have a 30-pack-year history of smoking and currently smoke or have quit within the past 15 years. Fecal occult blood test (FOBT) of the stool. You may have this test every year starting at age 71. Flexible sigmoidoscopy or colonoscopy. You may have a sigmoidoscopy every 5 years or a colonoscopy every 10 years starting at age 26. Hepatitis C blood test. Hepatitis B blood test. Sexually transmitted disease (STD) testing. Diabetes screening. This is done by checking your blood sugar (glucose) after you have not eaten for a while (fasting). You may have this done every 1-3 years. Bone density scan. This is done to screen for osteoporosis. You may have this done starting at age 71. Mammogram. This may be done every 1-2 years. Talk to your health care provider about how often you should have regular mammograms. Talk with your health care provider about your test results, treatment options, and if necessary, the need for more tests. Vaccines  Your health care provider may recommend certain vaccines, such as: Influenza vaccine. This is recommended every year. Tetanus, diphtheria, and acellular pertussis (Tdap, Td) vaccine. You may need a Td booster every 10 years.  Zoster vaccine. You may need this after age  32. Pneumococcal 13-valent conjugate (PCV13) vaccine. One dose is recommended after age 71. Pneumococcal polysaccharide (PPSV23) vaccine. One dose is recommended after age 57. Talk to your health care provider about which screenings and vaccines you need and how often you need them. This information is not intended to replace advice given to you by your health care provider. Make sure you discuss any questions you have with your health care provider. Document Released: 04/17/2015 Document Revised: 12/09/2015 Document Reviewed: 01/20/2015 Elsevier Interactive Patient Education  2017 [place] Prevention in the Home Falls can cause injuries. They can happen to people of all ages. There are many things you can do to make your home safe and to help prevent falls. What can I do on the outside of my home? Regularly fix the edges of walkways and driveways and fix any cracks. Remove anything that might make you trip as you walk through a door, such as a raised step or threshold. Trim any bushes or trees on the path to your home. Use bright outdoor lighting. Clear any walking paths of anything that might make someone trip, such as rocks or tools. Regularly check to see if handrails are loose or broken. Make sure that both sides of any steps have handrails. Any raised decks and porches should have guardrails on the edges. Have any leaves, snow, or ice cleared regularly. Use sand or salt on walking paths during winter. Clean up any spills in your garage right away. This includes oil or grease spills. What can I do in the bathroom? Use night lights. Install grab bars by the toilet and in the tub and shower. Do not use towel bars as grab bars. Use non-skid mats or decals in the tub or shower. If you need to sit down in the shower, use a plastic, non-slip stool. Keep the floor dry. Clean up any water that spills on the floor as soon as it happens. Remove soap buildup in the tub or shower  regularly. Attach bath mats securely with double-sided non-slip rug tape. Do not have throw rugs and other things on the floor that can make you trip. What can I do in the bedroom? Use night lights. Make sure that you have a light by your bed that is easy to reach. Do not use any sheets or blankets that are too big for your bed. They should not hang down onto the floor. Have a firm chair that has side arms. You can use this for support while you get dressed. Do not have throw rugs and other things on the floor that can make you trip. What can I do in the kitchen? Clean up any spills right away. Avoid walking on wet floors. Keep items that you use a lot in easy-to-reach places. If you need to reach something above you, use a strong step stool that has a grab bar. Keep electrical cords out of the way. Do not use floor polish or wax that makes floors slippery. If you must use wax, use non-skid floor wax. Do not have throw rugs and other things on the floor that can make you trip. What can I do with my stairs? Do not leave any items on the stairs. Make sure that there are handrails on both sides of the stairs and use them. Fix handrails that are broken or loose. Make sure that handrails are as long as the stairways. Check any carpeting to make sure that  it is firmly attached to the stairs. Fix any carpet that is loose or worn. Avoid having throw rugs at the top or bottom of the stairs. If you do have throw rugs, attach them to the floor with carpet tape. Make sure that you have a light switch at the top of the stairs and the bottom of the stairs. If you do not have them, ask someone to add them for you. What else can I do to help prevent falls? Wear shoes that: Do not have high heels. Have rubber bottoms. Are comfortable and fit you well. Are closed at the toe. Do not wear sandals. If you use a stepladder: Make sure that it is fully opened. Do not climb a closed stepladder. Make sure that  both sides of the stepladder are locked into place. Ask someone to hold it for you, if possible. Clearly mark and make sure that you can see: Any grab bars or handrails. First and last steps. Where the edge of each step is. Use tools that help you move around (mobility aids) if they are needed. These include: Canes. Walkers. Scooters. Crutches. Turn on the lights when you go into a dark area. Replace any light bulbs as soon as they burn out. Set up your furniture so you have a clear path. Avoid moving your furniture around. If any of your floors are uneven, fix them. If there are any pets around you, be aware of where they are. Review your medicines with your doctor. Some medicines can make you feel dizzy. This can increase your chance of falling. Ask your doctor what other things that you can do to help prevent falls. This information is not intended to replace advice given to you by your health care provider. Make sure you discuss any questions you have with your health care provider. Document Released: 01/15/2009 Document Revised: 08/27/2015 Document Reviewed: 04/25/2014 Elsevier Interactive Patient Education  2017 [employer].

## 2021-02-05 ENCOUNTER — Other Ambulatory Visit: Payer: Self-pay

## 2021-02-05 ENCOUNTER — Inpatient Hospital Stay: Payer: Medicare PPO | Attending: Oncology

## 2021-02-05 ENCOUNTER — Inpatient Hospital Stay: Payer: Medicare PPO

## 2021-02-05 ENCOUNTER — Inpatient Hospital Stay: Payer: Medicare PPO | Admitting: Oncology

## 2021-02-05 VITALS — BP 129/72 | HR 74 | Temp 98.4°F | Resp 18 | Wt 106.2 lb

## 2021-02-05 DIAGNOSIS — C180 Malignant neoplasm of cecum: Secondary | ICD-10-CM

## 2021-02-05 DIAGNOSIS — Z79899 Other long term (current) drug therapy: Secondary | ICD-10-CM

## 2021-02-05 DIAGNOSIS — Z8543 Personal history of malignant neoplasm of ovary: Secondary | ICD-10-CM | POA: Insufficient documentation

## 2021-02-05 DIAGNOSIS — D561 Beta thalassemia: Secondary | ICD-10-CM | POA: Diagnosis not present

## 2021-02-05 DIAGNOSIS — E538 Deficiency of other specified B group vitamins: Secondary | ICD-10-CM | POA: Insufficient documentation

## 2021-02-05 LAB — COMPREHENSIVE METABOLIC PANEL
ALT: 34 U/L (ref 0–44)
AST: 29 U/L (ref 15–41)
Albumin: 3.9 g/dL (ref 3.5–5.0)
Alkaline Phosphatase: 53 U/L (ref 38–126)
Anion gap: 8 (ref 5–15)
BUN: 20 mg/dL (ref 8–23)
CO2: 27 mmol/L (ref 22–32)
Calcium: 8.8 mg/dL — ABNORMAL LOW (ref 8.9–10.3)
Chloride: 103 mmol/L (ref 98–111)
Creatinine, Ser: 0.95 mg/dL (ref 0.44–1.00)
GFR, Estimated: 60 mL/min — ABNORMAL LOW (ref >60)
Glucose, Bld: 103 mg/dL — ABNORMAL HIGH (ref 70–99)
Potassium: 3.2 mmol/L — ABNORMAL LOW (ref 3.5–5.1)
Sodium: 138 mmol/L (ref 135–145)
Total Bilirubin: 2.1 mg/dL — ABNORMAL HIGH (ref 0.3–1.2)
Total Protein: 7.4 g/dL (ref 6.5–8.1)

## 2021-02-05 LAB — CBC WITH DIFFERENTIAL/PLATELET
Abs Immature Granulocytes: 0.03 10*3/uL (ref 0.00–0.07)
Basophils Absolute: 0 10*3/uL (ref 0.0–0.1)
Basophils Relative: 0 %
Eosinophils Absolute: 0.2 10*3/uL (ref 0.0–0.5)
Eosinophils Relative: 2 %
HCT: 26.4 % — ABNORMAL LOW (ref 36.0–46.0)
Hemoglobin: 8.5 g/dL — ABNORMAL LOW (ref 12.0–15.0)
Immature Granulocytes: 0 %
Lymphocytes Relative: 26 %
Lymphs Abs: 2.2 10*3/uL (ref 0.7–4.0)
MCH: 23.9 pg — ABNORMAL LOW (ref 26.0–34.0)
MCHC: 32.2 g/dL (ref 30.0–36.0)
MCV: 74.2 fL — ABNORMAL LOW (ref 80.0–100.0)
Monocytes Absolute: 0.7 10*3/uL (ref 0.1–1.0)
Monocytes Relative: 9 %
Neutro Abs: 5.1 10*3/uL (ref 1.7–7.7)
Neutrophils Relative %: 63 %
Platelets: 334 10*3/uL (ref 150–400)
RBC: 3.56 MIL/uL — ABNORMAL LOW (ref 3.87–5.11)
RDW: 21.7 % — ABNORMAL HIGH (ref 11.5–15.5)
WBC: 8.2 10*3/uL (ref 4.0–10.5)
nRBC: 0 % (ref 0.0–0.2)

## 2021-02-05 MED ORDER — CYANOCOBALAMIN 1000 MCG/ML IJ SOLN
1000.0000 ug | INTRAMUSCULAR | Status: DC
  Administered 2021-02-05: 1000 ug via INTRAMUSCULAR
  Filled 2021-02-05: qty 1

## 2021-02-05 NOTE — Progress Notes (Signed)
Pt states that at times when she eats it seems to go right through her but it does not seem to bother her. Had her flu shot yesterday.

## 2021-02-06 LAB — CEA: CEA: 5 ng/mL — ABNORMAL HIGH (ref 0.0–4.7)

## 2021-02-08 DIAGNOSIS — I25810 Atherosclerosis of coronary artery bypass graft(s) without angina pectoris: Secondary | ICD-10-CM | POA: Diagnosis not present

## 2021-02-10 ENCOUNTER — Other Ambulatory Visit: Payer: Self-pay

## 2021-02-10 ENCOUNTER — Inpatient Hospital Stay (HOSPITAL_BASED_OUTPATIENT_CLINIC_OR_DEPARTMENT_OTHER): Payer: Medicare PPO | Admitting: Nurse Practitioner

## 2021-02-10 ENCOUNTER — Telehealth: Payer: Self-pay | Admitting: Obstetrics and Gynecology

## 2021-02-10 ENCOUNTER — Inpatient Hospital Stay: Payer: Medicare PPO

## 2021-02-10 VITALS — BP 132/62 | HR 64 | Temp 97.8°F | Resp 20 | Wt 107.2 lb

## 2021-02-10 DIAGNOSIS — C180 Malignant neoplasm of cecum: Secondary | ICD-10-CM | POA: Diagnosis not present

## 2021-02-10 DIAGNOSIS — Z08 Encounter for follow-up examination after completed treatment for malignant neoplasm: Secondary | ICD-10-CM

## 2021-02-10 DIAGNOSIS — Z79899 Other long term (current) drug therapy: Secondary | ICD-10-CM | POA: Diagnosis not present

## 2021-02-10 DIAGNOSIS — Z8543 Personal history of malignant neoplasm of ovary: Secondary | ICD-10-CM | POA: Diagnosis not present

## 2021-02-10 DIAGNOSIS — D561 Beta thalassemia: Secondary | ICD-10-CM | POA: Diagnosis not present

## 2021-02-10 DIAGNOSIS — E538 Deficiency of other specified B group vitamins: Secondary | ICD-10-CM | POA: Diagnosis not present

## 2021-02-10 NOTE — Progress Notes (Signed)
Gynecologic Oncology Interval Visit   Chief Concern: Ovarian cancer surveillance  Subjective:  Phyllis Ross is a 85 y.o. female, diagnosed with stage IIIb ovarian adenocarcinoma s/p surgical cytoreduction in 10/2003 followed by paclitaxel and carboplatin chemotherapy completed 04/2004, who returns to clinic for continued surveillance.   In the interim, she was diagnosed with stage IIIc adenocarcinoma of the colon and undewernt right hemicolectomy and 6 cycles of adjuvant xeloda. She is tolerating dose reduced xeloda well. She continues to recover from surgery but feels energy and strength is improving. 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   She is accompanied by her daughter, Phyllis Ross today. She had left rib pain in October, undwerwent chest xray with PCP which was negative for acute finding. Pain has resolved.   She had imaging in March 2022 for colon cancer surveillance which was negative for evidence of recurrent ovarian cancer.     Gynecologic Oncology History Phyllis Ross is a pleasant female who has a history of stage IIIb ovarian adenocarcinoma s/p surgical cytoreduction in 10/2003 followed by paclitaxel and carboplatin chemotherapy completed 04/2004. Serial CA125 negative.  Clinically NED.   Nov 2015 BI-RADS CATEGORY  1: Negative. Per her report she also had a mammogram last year that was negative. Ordered by Dr. Ronnald Ramp. She did not have a mammogram since b/c of discomfort with the exam.   CA 125  26.7 (11/07/2018)   25 (05/15/2017)  In the interim, she saw her PCP and complained of flank pain. Ultrasound of pelvis and abdomen were negative for acute finding. No mass or free fluid within midline pelvis or within either adnexal region. Non-obstructing urinary calculus observed.   She was last seen by gyn-onc 11/2018 with negative exam. Today, she says she feels well and denies specific complaints. Abdominal and flank pain in December has  resolved with no other occurrences. Her husband broke his hip and she has been caregiver for him and her 6 year old mother.    Problem List: Patient Active Problem List   Diagnosis Date Noted   B12 deficiency 09/16/2020   Goals of care, counseling/discussion 08/07/2020   Microcytic anemia 07/23/2020   Status post partial colectomy 07/02/2020   Protein-calorie malnutrition, severe 06/24/2020   Cecal cancer ([place]) 06/15/2020   Iron deficiency anemia due to chronic blood loss    Neoplasm of lower gastrointestinal tract    Acute gastritis with hemorrhage    Personal history of other malignant neoplasm of skin 01/21/2019   Atherosclerosis of abdominal aorta ([place]) 12/26/2017   Age-related osteoporosis without current pathological fracture 06/16/2016   Encounter for follow-up surveillance of ovarian cancer 05/20/2015   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   Wedging of vertebra ([place]) 10/13/2014   Polypharmacy 10/13/2014   Encounter for general adult medical examination without abnormal findings 10/13/2014   Anxiety 10/13/2014   Essential hypertension 08/27/2014   Bradycardia 08/27/2014   Coronary artery disease of native heart with stable angina pectoris ([place]) 08/27/2014   MI (mitral incompetence) 08/27/2014   TI (tricuspid incompetence) 08/27/2014   Combined fat and carbohydrate induced hyperlipemia 08/13/2014   HTN (hypertension), malignant 08/11/2014   Chest pain 08/11/2014   CAD (coronary artery disease) of artery bypass graft 08/11/2014    Past Medical History: Past Medical History:  Diagnosis Date   Anemia    Anxiety    Cancer ([place])    Hyperlipidemia    Hypertension    MI (myocardial infarction) ([place]) 2008   triple  bypass   Osteoporosis    Pneumonia    as a baby   Rheumatic fever     Past Surgical History: Past Surgical History:  Procedure Laterality Date   COLONOSCOPY  2011   normal- Dr Vira Agar   COLONOSCOPY WITH PROPOFOL N/A 06/04/2020    Procedure: COLONOSCOPY WITH PROPOFOL;  Surgeon: Lucilla Lame, MD;  Location: [place];  Service: Endoscopy;  Laterality: N/A;   CORONARY ARTERY BYPASS GRAFT     ESOPHAGOGASTRODUODENOSCOPY (EGD) WITH PROPOFOL N/A 06/04/2020   Procedure: ESOPHAGOGASTRODUODENOSCOPY (EGD) WITH PROPOFOL;  Surgeon: Lucilla Lame, MD;  Location: [place];  Service: Endoscopy;  Laterality: N/A;   VAGINAL HYSTERECTOMY     DUB    Past Gynecologic History:  As per Oncology history  OB History:  OB History  No obstetric history on file.    Family History: Family History  Problem Relation Age of Onset   Heart attack Mother    Heart attack Maternal Uncle    Heart attack Maternal Uncle     Social History: Social History   Socioeconomic History   Marital status: Married    Spouse name: Not on file   Number of children: 2   Years of education: Not on file   Highest education level: Some college, no degree  Occupational History    Employer: RETIRED  Tobacco Use   Smoking status: Never   Smokeless tobacco: Never  Vaping Use   Vaping Use: Never used  Substance and Sexual Activity   Alcohol use: No   Drug use: No   Sexual activity: Yes  Other Topics Concern   Not on file  Social History Narrative   Not on file   Social Determinants of Health   Financial Resource Strain: Low Risk    Difficulty of Paying Living Expenses: Not hard at all  Food Insecurity: No Food Insecurity   Worried About Charity fundraiser in the Last Year: Never true   [place] in the Last Year: Never true  Transportation Needs: No Transportation Needs   Lack of Transportation (Medical): No   Lack of Transportation (Non-Medical): No  Physical Activity: Insufficiently Active   Days of Exercise per Week: 4 days   Minutes of Exercise per Session: 10 min  Stress: No Stress Concern Present   Feeling of Stress : Not at all  Social Connections: Socially Integrated   Frequency of Communication with  Friends and Family: More than three times a week   Frequency of Social Gatherings with Friends and Family: More than three times a week   Attends Religious Services: More than 4 times per year   Active Member of [employer] or Organizations: Yes   Attends Music therapist: More than 4 times per year   Marital Status: Married  Human resources officer Violence: Not At Risk   Fear of Current or Ex-Partner: No   Emotionally Abused: No   Physically Abused: No   Sexually Abused: No    Allergies: Allergies  Allergen Reactions   Trazodone And Nefazodone Other (See Comments)    Made her pass out    Current Medications: Current Outpatient Medications  Medication Sig Dispense Refill   ALPRAZolam (XANAX) 0.25 MG tablet Take 1 tablet (0.25 mg total) by mouth daily as needed for anxiety. 30 tablet 5   atorvastatin (LIPITOR) 80 MG tablet TAKE ONE TABLET BY MOUTH ONCE DAILY 90 tablet 1   capecitabine (XELODA) 500 MG tablet Take 2 tablets (1000mg ) by mouth  in AM and 1 tablet (500mg ) in PM. Take with food. Take for 14 days, then hold for 7 days. Repeat every 21 days. 42 tablet 0   ferrous sulfate 325 (65 FE) MG tablet Take 325 mg by mouth daily with breakfast.     loperamide (IMODIUM) 2 MG capsule Take 2 mg by mouth as needed for diarrhea or loose stools.     megestrol (MEGACE) 40 MG tablet Take 1 tablet (40 mg total) by mouth 2 (two) times daily. 60 tablet 1   potassium chloride SA (KLOR-CON) 20 MEQ tablet Take 1 tablet (20 mEq total) by mouth daily. 30 tablet 0   ondansetron (ZOFRAN ODT) 4 MG disintegrating tablet Take 1 tablet (4 mg total) by mouth every 8 (eight) hours as needed for nausea or vomiting. 30 tablet 0   No current facility-administered medications for this visit.   Review of Systems General:  no complaints Skin: no complaints Eyes: no complaints HEENT: no complaints Breasts: no complaints Pulmonary: no complaints Cardiac: no complaints Gastrointestinal: no  complaints Genitourinary/Sexual: no complaints Ob/Gyn: no complaints Musculoskeletal: no complaints Hematology: no complaints Neurologic/Psych: no complaints  Objective:  Physical Examination:  BP 132/62   Pulse 64   Temp 97.8 F (36.6 C)   Resp 20   Wt 107 lb 3.2 oz (48.6 kg)   SpO2 100%   BMI 15.83 kg/m   ECOG Performance Status: 0 - Asymptomatic   GENERAL: frailer appearing compared to previous visits. In wheelchair. Accompanied.  HEENT:  Sclera clear. Anicteric NODES:  Negative axillary, supraclavicular, inguinal lymph node survery LUNGS:  Clear to auscultation bilaterally.   HEART:  Regular rate and rhythm.  ABDOMEN:  Soft, nontender.  No hernias, incisions well healed. No masses or ascites EXTREMITIES:  No peripheral edema. Atraumatic. No cyanosis SKIN:  Clear with no obvious rashes or skin changes.  NEURO:  Nonfocal. Well oriented.  Appropriate affect.  Pelvic:  exam chaperoned by CMA; Vulva: normal appearing vulva with no masses, tenderness or lesions; Vagina: normal; no discharge, bleeding, or lesions. Adnexa: negative for masses or nodularity; Uterus and Cervix: surgically absent; Rectal: not indicated   Lab Review Lab Results  Component Value Date   CA125 20.5 05/25/2016    Radiologic Imaging: None    Assessment:  Phyllis Ross is a 85 y.o. female diagnosed with history of stage IIIB  Ovarian adenocarcinoma s/p surgical cytoreduction 10/2003 followed by carbo-taxol chemotherapy x 6 completed 04/2004. NED since that time.   Clinically, NED. CA 125 was 31.6 approximately 3 months ago. She declines labs today and prefers to have scheduled for upcoming planned draw which is fine.   Stage III colon cancer s/p right hemicolectomy, adjuvant xeloda. Treatment complicated by anastomotic breakdown/leak.   Medical comorbidities: beta thalassemia Plan:   Problem List Items Addressed This Visit   None  Continue annual surveillance with CA 125. She will have ca  125 at next lab draw on 03/10/21. Imaging based on clinical symtoms. Continue follow up with Dr. Janese Banks for colon cancer.   Based on her preference she will continue surveillance with Dr. Janese Banks and declines future pelvic exams. She can return to gyn onc clinic if needed for symptoms or findings concerning of recurrent disease. Recommend checking ca125 at least annually.   Beckey Rutter, DNP, AGNP-C [place] at [facility] [phone] (clinic)  CC: Dr. Janese Banks

## 2021-02-10 NOTE — Telephone Encounter (Signed)
Daughter called and states that pt doesn't want to have any labs taken today when she comes in because she just had lab work on Friday.

## 2021-02-11 ENCOUNTER — Encounter: Payer: Self-pay | Admitting: Oncology

## 2021-02-11 NOTE — Progress Notes (Signed)
Hematology/Oncology Consult note [facility]  Telephone:([phone] Fax:([phone]) [phone]  Patient Care Team: Juline Patch, MD as PCP - General (Family Medicine) Corey Skains, MD as Consulting Physician (Cardiology) Clent Jacks, RN as Oncology Nurse Navigator Ronny Bacon, MD as Consulting Physician (General Surgery) Sindy Guadeloupe, MD as Consulting Physician (Oncology)   Name of the patient: Phyllis Ross  [id number]  [birth year]   Date of visit: 02/11/21  Diagnosis-3 cecal adenocarcinoma on adjuvant Xeloda  Chief complaint/ Reason for visit-routine follow-up of cecal cancer  Heme/Onc history: Patient is a 85 year old female who was 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  She is s/p laparoscopic right hemicolectomy.  Pathology showed 5.2 x 4.3 x 2.7 cm grade 2 moderately differentiated adenocarcinoma with invasion through muscularis propria into pericolorectal tissue.  7 of 27 lymph nodes positive.  Lymphovascular invasion present but no perineural invasion.  MMR negative.  [id number].  Right hemicolectomy complicated by anastomotic breakdown/leak which was treated with antibiotics.  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  Also has a history of baseline microcytic anemia secondary to beta thalassemia and baseline hemoglobin is between 10-11.   Patient has met with Dr. Mike Gip in the past management chemotherapy options including oral Xeloda has been offered to the patient   Patient started adjuvant Xeloda on 08/23/2020. It was held after 4 cycles due to worsening anemia and restarted at a lower dose    Interval history-patient is here with her daughter today.  Overall she reports doing better.  She has more energy and appetite is continuing to improve.  She denies any significant side effects from  Xeloda.  Denies any nausea vomiting or diarrhea  ECOG PS- 1 Pain scale- 0   Review of systems- Review of Systems  Constitutional:  Positive for malaise/fatigue. Negative for chills, fever and weight loss.  HENT:  Negative for congestion, ear discharge and nosebleeds.   Eyes:  Negative for blurred vision.  Respiratory:  Negative for cough, hemoptysis, sputum production, shortness of breath and wheezing.   Cardiovascular:  Negative for chest pain, palpitations, orthopnea and claudication.  Gastrointestinal:  Negative for abdominal pain, blood in stool, constipation, diarrhea, heartburn, melena, nausea and vomiting.  Genitourinary:  Negative for dysuria, flank pain, frequency, hematuria and urgency.  Musculoskeletal:  Negative for back pain, joint pain and myalgias.  Skin:  Negative for rash.  Neurological:  Negative for dizziness, tingling, focal weakness, seizures, weakness and headaches.  Endo/Heme/Allergies:  Does not bruise/bleed easily.  Psychiatric/Behavioral:  Negative for depression and suicidal ideas. The patient does not have insomnia.      Allergies  Allergen Reactions   Trazodone And Nefazodone Other (See Comments)    Made her pass out     Past Medical History:  Diagnosis Date   Anemia    Anxiety    Cancer ([place])    Hyperlipidemia    Hypertension    MI (myocardial infarction) ([place]) 2008   triple bypass   Osteoporosis    Pneumonia    as a baby   Rheumatic fever      Past Surgical History:  Procedure Laterality Date   COLONOSCOPY  2011   normal- Dr Vira Agar   COLONOSCOPY WITH PROPOFOL N/A 06/04/2020   Procedure: COLONOSCOPY WITH PROPOFOL;  Surgeon: Lucilla Lame, MD;  Location: [place];  Service: Endoscopy;  Laterality: N/A;   CORONARY ARTERY BYPASS GRAFT     ESOPHAGOGASTRODUODENOSCOPY (EGD) WITH PROPOFOL N/A 06/04/2020   Procedure: ESOPHAGOGASTRODUODENOSCOPY (EGD) WITH PROPOFOL;  Surgeon: Lucilla Lame, MD;  Location: [place];  Service:  Endoscopy;  Laterality: N/A;   VAGINAL HYSTERECTOMY     DUB    Social History   Socioeconomic History   Marital status: Married    Spouse name: Not on file   Number of children: 2   Years of education: Not on file   Highest education level: Some college, no degree  Occupational History    Employer: RETIRED  Tobacco Use   Smoking status: Never   Smokeless tobacco: Never  Vaping Use   Vaping Use: Never used  Substance and Sexual Activity   Alcohol use: No   Drug use: No   Sexual activity: Yes  Other Topics Concern   Not on file  Social History Narrative   Not on file   Social Determinants of Health   Financial Resource Strain: Low Risk    Difficulty of Paying Living Expenses: Not hard at all  Food Insecurity: No Food Insecurity   Worried About Charity fundraiser in the Last Year: Never true   [place] in the Last Year: Never true  Transportation Needs: No Transportation Needs   Lack of Transportation (Medical): No   Lack of Transportation (Non-Medical): No  Physical Activity: Insufficiently Active   Days of Exercise per Week: 4 days   Minutes of Exercise per Session: 10 min  Stress: No Stress Concern Present   Feeling of Stress : Not at all  Social Connections: Socially Integrated   Frequency of Communication with Friends and Family: More than three times a week   Frequency of Social Gatherings with Friends and Family: More than three times a week   Attends Religious Services: More than 4 times per year   Active Member of [employer] or Organizations: Yes   Attends Music therapist: More than 4 times per year   Marital Status: Married  Human resources officer Violence: Not At Risk   Fear of Current or Ex-Partner: No   Emotionally Abused: No   Physically Abused: No   Sexually Abused: No    Family History  Problem Relation Age of Onset   Heart attack Mother    Heart attack Maternal Uncle    Heart attack Maternal Uncle      Current Outpatient  Medications:    ALPRAZolam (XANAX) 0.25 MG tablet, Take 1 tablet (0.25 mg total) by mouth daily as needed for anxiety., Disp: 30 tablet, Rfl: 5   atorvastatin (LIPITOR) 80 MG tablet, TAKE ONE TABLET BY MOUTH ONCE DAILY, Disp: 90 tablet, Rfl: 1   capecitabine (XELODA) 500 MG tablet, Take 2 tablets (102m) by mouth in AM and 1 tablet (5034m in PM. Take with food. Take for 14 days, then hold for 7 days. Repeat every 21 days., Disp: 42 tablet, Rfl: 0   ferrous sulfate 325 (65 FE) MG tablet, Take 325 mg by mouth daily with breakfast., Disp: , Rfl:    loperamide (IMODIUM) 2 MG capsule, Take 2 mg by mouth as needed for diarrhea or loose stools., Disp: , Rfl:    megestrol (MEGACE) 40 MG tablet, Take 1 tablet (40 mg total) by mouth 2 (two) times daily., Disp: 60 tablet, Rfl: 1   potassium chloride SA (KLOR-CON) 20 MEQ tablet, Take 1 tablet (20 mEq total) by mouth daily., Disp: 30 tablet, Rfl: 0  ondansetron (ZOFRAN ODT) 4 MG disintegrating tablet, Take 1 tablet (4 mg total) by mouth every 8 (eight) hours as needed for nausea or vomiting., Disp: 30 tablet, Rfl: 0  Physical exam:  Vitals:   02/05/21 1401  BP: 129/72  Pulse: 74  Resp: 18  Temp: 98.4 F (36.9 C)  SpO2: 100%  Weight: 106 lb 3.2 oz (48.2 kg)   Physical Exam Constitutional:      General: She is not in acute distress. Cardiovascular:     Rate and Rhythm: Normal rate and regular rhythm.     Heart sounds: Normal heart sounds.  Pulmonary:     Effort: Pulmonary effort is normal.     Breath sounds: Normal breath sounds.  Abdominal:     General: Bowel sounds are normal.     Palpations: Abdomen is soft.  Skin:    General: Skin is warm and dry.  Neurological:     Mental Status: She is alert and oriented to person, place, and time.     CMP Latest Ref Rng & Units 02/05/2021  Glucose 70 - 99 mg/dL 103(H)  BUN 8 - 23 mg/dL 20  Creatinine 0.44 - 1.00 mg/dL 0.95  Sodium 135 - 145 mmol/L 138  Potassium 3.5 - 5.1 mmol/L 3.2(L)   Chloride 98 - 111 mmol/L 103  CO2 22 - 32 mmol/L 27  Calcium 8.9 - 10.3 mg/dL 8.8(L)  Total Protein 6.5 - 8.1 g/dL 7.4  Total Bilirubin 0.3 - 1.2 mg/dL 2.1(H)  Alkaline Phos 38 - 126 U/L 53  AST 15 - 41 U/L 29  ALT 0 - 44 U/L 34   CBC Latest Ref Rng & Units 02/05/2021  WBC 4.0 - 10.5 K/uL 8.2  Hemoglobin 12.0 - 15.0 g/dL 8.5(L)  Hematocrit 36.0 - 46.0 % 26.4(L)  Platelets 150 - 400 K/uL 334    No images are attached to the encounter.  DG Ribs Unilateral Left  Result Date: 01/26/2021 CLINICAL DATA:  Left rib pain EXAM: LEFT RIBS - 2 VIEW COMPARISON:  None. FINDINGS: No fracture or other bone lesions are seen involving the ribs. Left apical pleuroparenchymal scarring. The left hemithorax appears hyperinflated suggesting changes of COPD. Coronary artery bypass grafting has been performed. 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 IMPRESSION: No acute fracture or focal rib lesion identified. Electronically Signed   By: Fidela Salisbury M.D.   On: 01/26/2021 18:36     Assessment and plan- Patient is a 85 y.o. female  with history of stage IIIc adenocarcinoma of the colon s/p 6 cycles of adjuvant Xeloda.  She is here for routine follow-up visit  Overall patient is tolerating Xeloda well and counts are otherwise okay to proceed with cycle 8 of Xeloda today which would be her last cycle 2 weeks on and 1 week off.  I will see her backIn 4 to 5 weeks time with repeat labs CBC with differential CMP and CEA with a CT chest abdomen pelvis with contrast prior   Visit Diagnosis 1. Cecal cancer ([place])   2. High risk medication use      Dr. Randa Evens, MD, MPH [facility] at [facility] [phone] 02/11/2021 8:57 PM

## 2021-02-15 ENCOUNTER — Telehealth: Payer: Self-pay | Admitting: Oncology

## 2021-02-18 ENCOUNTER — Encounter: Payer: Self-pay | Admitting: Oncology

## 2021-02-19 ENCOUNTER — Other Ambulatory Visit: Payer: Self-pay | Admitting: Pharmacist

## 2021-02-19 DIAGNOSIS — E876 Hypokalemia: Secondary | ICD-10-CM

## 2021-02-19 MED ORDER — POTASSIUM CHLORIDE CRYS ER 20 MEQ PO TBCR: 20.0000 meq | EXTENDED_RELEASE_TABLET | Freq: Every day | ORAL | 2 refills | Status: DC

## 2021-02-22 ENCOUNTER — Other Ambulatory Visit (HOSPITAL_COMMUNITY): Payer: Self-pay

## 2021-02-23 ENCOUNTER — Encounter: Payer: Self-pay | Admitting: Oncology

## 2021-02-23 ENCOUNTER — Other Ambulatory Visit (HOSPITAL_COMMUNITY): Payer: Self-pay

## 2021-02-23 DIAGNOSIS — I25810 Atherosclerosis of coronary artery bypass graft(s) without angina pectoris: Secondary | ICD-10-CM | POA: Diagnosis not present

## 2021-02-23 DIAGNOSIS — I10 Essential (primary) hypertension: Secondary | ICD-10-CM | POA: Diagnosis not present

## 2021-02-23 DIAGNOSIS — E782 Mixed hyperlipidemia: Secondary | ICD-10-CM | POA: Diagnosis not present

## 2021-03-03 ENCOUNTER — Ambulatory Visit: Payer: Medicare PPO

## 2021-03-08 ENCOUNTER — Ambulatory Visit
Admission: RE | Admit: 2021-03-08 | Discharge: 2021-03-08 | Disposition: A | Discharge status: Home / Self Care | Payer: Medicare PPO | Source: Ambulatory Visit | Attending: Oncology | Admitting: Oncology

## 2021-03-08 DIAGNOSIS — M439 Deforming dorsopathy, unspecified: Secondary | ICD-10-CM | POA: Diagnosis not present

## 2021-03-08 DIAGNOSIS — N281 Cyst of kidney, acquired: Secondary | ICD-10-CM | POA: Diagnosis not present

## 2021-03-08 DIAGNOSIS — J849 Interstitial pulmonary disease, unspecified: Secondary | ICD-10-CM | POA: Diagnosis not present

## 2021-03-08 DIAGNOSIS — I700 Atherosclerosis of aorta: Secondary | ICD-10-CM | POA: Diagnosis not present

## 2021-03-08 DIAGNOSIS — C180 Malignant neoplasm of cecum: Secondary | ICD-10-CM | POA: Insufficient documentation

## 2021-03-08 DIAGNOSIS — N200 Calculus of kidney: Secondary | ICD-10-CM | POA: Diagnosis not present

## 2021-03-08 DIAGNOSIS — Z8543 Personal history of malignant neoplasm of ovary: Secondary | ICD-10-CM | POA: Diagnosis not present

## 2021-03-08 DIAGNOSIS — C189 Malignant neoplasm of colon, unspecified: Secondary | ICD-10-CM | POA: Diagnosis not present

## 2021-03-08 LAB — POCT I-STAT CREATININE: Creatinine, Ser: 1 mg/dL (ref 0.44–1.00)

## 2021-03-08 MED ORDER — IOHEXOL 300 MG/ML  SOLN
75.0000 mL | Freq: Once | INTRAMUSCULAR | Status: AC | PRN
  Administered 2021-03-08: 75 mL via INTRAVENOUS

## 2021-03-09 ENCOUNTER — Telehealth: Payer: Self-pay | Admitting: Oncology

## 2021-03-09 ENCOUNTER — Other Ambulatory Visit (HOSPITAL_COMMUNITY): Payer: Self-pay

## 2021-03-09 NOTE — Telephone Encounter (Signed)
Daughter called to cancel pt's appt for 12-7. Lease give her a call back at [phone]

## 2021-03-10 ENCOUNTER — Inpatient Hospital Stay: Payer: Medicare PPO | Admitting: Oncology

## 2021-03-10 ENCOUNTER — Inpatient Hospital Stay: Payer: Medicare PPO | Attending: Oncology

## 2021-03-10 DIAGNOSIS — Z8543 Personal history of malignant neoplasm of ovary: Secondary | ICD-10-CM | POA: Insufficient documentation

## 2021-03-10 DIAGNOSIS — D509 Iron deficiency anemia, unspecified: Secondary | ICD-10-CM | POA: Insufficient documentation

## 2021-03-10 DIAGNOSIS — C180 Malignant neoplasm of cecum: Secondary | ICD-10-CM | POA: Insufficient documentation

## 2021-03-10 DIAGNOSIS — D561 Beta thalassemia: Secondary | ICD-10-CM | POA: Insufficient documentation

## 2021-03-12 ENCOUNTER — Telehealth: Payer: Self-pay | Admitting: *Deleted

## 2021-03-12 NOTE — Telephone Encounter (Signed)
Judeen Hammans, do you know what Dr. Janese Banks planned for her f/u? I am assuming she will want to see her for a f/u appt post-CT.

## 2021-03-12 NOTE — Telephone Encounter (Signed)
Given the below message from [facility] and Dr. Elroy Channel last office note, I inactivated patient with the [place] for her Xeloda.   Hassan Rowan, did you let Colletta Maryland know about the information in Sherry's message?

## 2021-03-12 NOTE — Telephone Encounter (Signed)
Colletta Maryland called stating that Elvina Sidle called her asking if patient is to continue her chemotherapy pills or not. She was unsure how to answer their question. Please advise, patient had CT 03/08/21 and has no FOLLOW UP appts for future  Conversation: Xeloda  [facility]) February 24, 2021 Luella Cook, RN to Charlann Boxer     5:00 PM Glenard Haring, Dr. Janese Banks said that she hAS COMPLETED THE  8 cycles so no more xeloda. Thank you and have a great thanksgiving sherry  Last read by Charlann Boxer at 6:19 PM on 02/24/2021. February 23, 2021

## 2021-03-15 ENCOUNTER — Encounter: Payer: Self-pay | Admitting: Oncology

## 2021-03-17 ENCOUNTER — Telehealth: Payer: Self-pay | Admitting: Oncology

## 2021-03-17 NOTE — Telephone Encounter (Signed)
Spoke with daughter to let her know that we can give her a call during her appointment with Sonia Baller tomorrow. Confirmed her phone number and updated appointment notes.

## 2021-03-17 NOTE — Telephone Encounter (Signed)
Daughter wants to know if you can facetime her during pt visit on 12-15? Her name and number is [phone]

## 2021-03-18 ENCOUNTER — Other Ambulatory Visit: Payer: Self-pay

## 2021-03-18 ENCOUNTER — Inpatient Hospital Stay: Payer: Medicare PPO

## 2021-03-18 ENCOUNTER — Encounter: Payer: Self-pay | Admitting: Oncology

## 2021-03-18 ENCOUNTER — Inpatient Hospital Stay (HOSPITAL_BASED_OUTPATIENT_CLINIC_OR_DEPARTMENT_OTHER): Payer: Medicare PPO | Admitting: Oncology

## 2021-03-18 VITALS — BP 126/76 | HR 69 | Temp 98.6°F | Resp 16 | Wt 107.8 lb

## 2021-03-18 DIAGNOSIS — D509 Iron deficiency anemia, unspecified: Secondary | ICD-10-CM

## 2021-03-18 DIAGNOSIS — C180 Malignant neoplasm of cecum: Secondary | ICD-10-CM

## 2021-03-18 DIAGNOSIS — D561 Beta thalassemia: Secondary | ICD-10-CM | POA: Diagnosis not present

## 2021-03-18 DIAGNOSIS — Z8543 Personal history of malignant neoplasm of ovary: Secondary | ICD-10-CM | POA: Diagnosis not present

## 2021-03-18 LAB — COMPREHENSIVE METABOLIC PANEL
ALT: 49 U/L — ABNORMAL HIGH (ref 0–44)
AST: 40 U/L (ref 15–41)
Albumin: 3.9 g/dL (ref 3.5–5.0)
Alkaline Phosphatase: 51 U/L (ref 38–126)
Anion gap: 11 (ref 5–15)
BUN: 20 mg/dL (ref 8–23)
CO2: 25 mmol/L (ref 22–32)
Calcium: 9.4 mg/dL (ref 8.9–10.3)
Chloride: 104 mmol/L (ref 98–111)
Creatinine, Ser: 0.99 mg/dL (ref 0.44–1.00)
GFR, Estimated: 57 mL/min — ABNORMAL LOW (ref >60)
Glucose, Bld: 101 mg/dL — ABNORMAL HIGH (ref 70–99)
Potassium: 3.7 mmol/L (ref 3.5–5.1)
Sodium: 140 mmol/L (ref 135–145)
Total Bilirubin: 1.7 mg/dL — ABNORMAL HIGH (ref 0.3–1.2)
Total Protein: 7.7 g/dL (ref 6.5–8.1)

## 2021-03-18 LAB — CBC WITH DIFFERENTIAL/PLATELET
Abs Immature Granulocytes: 0.02 10*3/uL (ref 0.00–0.07)
Basophils Absolute: 0.1 10*3/uL (ref 0.0–0.1)
Basophils Relative: 1 %
Eosinophils Absolute: 0.1 10*3/uL (ref 0.0–0.5)
Eosinophils Relative: 1 %
HCT: 32.3 % — ABNORMAL LOW (ref 36.0–46.0)
Hemoglobin: 10.1 g/dL — ABNORMAL LOW (ref 12.0–15.0)
Immature Granulocytes: 0 %
Lymphocytes Relative: 28 %
Lymphs Abs: 2.4 10*3/uL (ref 0.7–4.0)
MCH: 24.5 pg — ABNORMAL LOW (ref 26.0–34.0)
MCHC: 31.3 g/dL (ref 30.0–36.0)
MCV: 78.2 fL — ABNORMAL LOW (ref 80.0–100.0)
Monocytes Absolute: 0.8 10*3/uL (ref 0.1–1.0)
Monocytes Relative: 9 %
Neutro Abs: 5 10*3/uL (ref 1.7–7.7)
Neutrophils Relative %: 61 %
Platelets: 325 10*3/uL (ref 150–400)
RBC: 4.13 MIL/uL (ref 3.87–5.11)
RDW: 16.6 % — ABNORMAL HIGH (ref 11.5–15.5)
WBC: 8.3 10*3/uL (ref 4.0–10.5)
nRBC: 0 % (ref 0.0–0.2)

## 2021-03-18 NOTE — Progress Notes (Signed)
Patient denies new problems/concerns today.   °

## 2021-03-18 NOTE — Progress Notes (Signed)
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  She is s/p laparoscopic right hemicolectomy.  Pathology showed 5.2 x 4.3 x 2.7 cm grade 2 moderately differentiated adenocarcinoma with invasion through muscularis propria into pericolorectal tissue.  7 of 27 lymph nodes positive.  Lymphovascular invasion present but no perineural invasion.  MMR negative.  [id number].  Right hemicolectomy complicated by anastomotic breakdown/leak which was treated with antibiotics.  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  Also has a history of baseline microcytic anemia secondary to beta thalassemia and baseline hemoglobin is between 10-11.   Patient has met with Dr. Mike Gip in the past management chemotherapy options including oral Xeloda has been offered to the patient   Patient started adjuvant Xeloda on 08/23/2020. It was held after 4 cycles due to worsening anemia and restarted at a lower dose  Interval history-patient presents with her son today.  Her daughter Colletta [place] is on speaker phone.  They are here to review most recent CT scan after completing 8  cycles of Xeloda.  Reports tolerating Xeloda well without any side effects.  Since her treatments, she denies any new concerns.  ECOG PS- 1 Pain scale- 0   Review of systems- Review of Systems  Constitutional: Negative.  Negative for chills, fever, malaise/fatigue and weight loss.  HENT:  Negative for congestion, ear pain and tinnitus.   Eyes: Negative.  Negative for blurred vision and double vision.  Respiratory: Negative.  Negative for cough, sputum production and shortness of breath.   Cardiovascular: Negative.  Negative for chest pain, palpitations and leg swelling.  Gastrointestinal: Negative.  Negative for abdominal pain, constipation, diarrhea, nausea and vomiting.  Genitourinary:  Negative for dysuria, frequency and urgency.  Musculoskeletal:  Negative for back pain and falls.  Skin: Negative.  Negative for rash.  Neurological: Negative.  Negative for weakness and headaches.  Endo/Heme/Allergies: Negative.  Does not bruise/bleed easily.  Psychiatric/Behavioral: Negative.  Negative for depression. The patient is not nervous/anxious and does not have insomnia.      Allergies  Allergen Reactions   Trazodone And Nefazodone Other (See Comments)    Made her pass out     Past Medical History:  Diagnosis Date   Anemia    Anxiety    Cancer ([place])    Hyperlipidemia    Hypertension    MI (myocardial infarction) ([place]) 2008   triple bypass   Osteoporosis    Pneumonia    as a baby   Rheumatic fever      Past Surgical History:  Procedure Laterality Date   COLONOSCOPY  2011   normal- Dr Vira Agar   COLONOSCOPY WITH PROPOFOL N/A 06/04/2020   Procedure: COLONOSCOPY WITH PROPOFOL;  Surgeon: Lucilla Lame, MD;  Location: [place];  Service: Endoscopy;  Laterality: N/A;   CORONARY ARTERY BYPASS GRAFT     ESOPHAGOGASTRODUODENOSCOPY (EGD) WITH PROPOFOL N/A 06/04/2020   Procedure: ESOPHAGOGASTRODUODENOSCOPY (EGD) WITH PROPOFOL;  Surgeon: Lucilla Lame, MD;  Location: [place];  Service: Endoscopy;  Laterality: N/A;   VAGINAL HYSTERECTOMY     DUB    Social History   Socioeconomic History   Marital status: Married    Spouse name: Not on file   Number of children: 2   Years of education: Not on file   Highest education level: Some college, no degree  Occupational History    Employer: RETIRED  Tobacco Use   Smoking status: Never   Smokeless tobacco: Never  Vaping Use   Vaping Use: Never used  Substance and Sexual Activity   Alcohol use: No   Drug use: No   Sexual activity: Yes  Other Topics Concern   Not on file  Social History Narrative   Not on file   Social Determinants of Health   Financial Resource Strain: Low Risk    Difficulty of Paying Living Expenses: Not hard at all  Food Insecurity: No Food Insecurity   Worried About Charity fundraiser in the Last Year: Never true   [place] in the Last Year: Never true  Transportation Needs: No Transportation Needs   Lack of Transportation (Medical): No   Lack of Transportation (Non-Medical): No  Physical Activity: Insufficiently Active   Days of Exercise per Week: 4 days   Minutes of Exercise per Session: 10 min  Stress: No Stress Concern Present   Feeling of Stress : Not at all  Social Connections: Socially Integrated   Frequency of Communication with Friends and Family: More than three times a week   Frequency of Social Gatherings with Friends and Family: More than three times a week   Attends Religious Services: More than 4 times per year   Active Member of [employer] or Organizations: Yes   Attends Music therapist: More than 4 times per year   Marital Status: Married  Human resources officer Violence: Not At Risk   Fear of Current or Ex-Partner: No   Emotionally Abused: No   Physically Abused: No   Sexually Abused: No    Family History  Problem Relation Age of Onset   Heart attack Mother    Heart attack Maternal Uncle    Heart attack Maternal Uncle       Current Outpatient Medications:    ALPRAZolam (XANAX) 0.25 MG tablet, Take 1 tablet (0.25 mg total) by mouth daily as needed for anxiety., Disp: 30 tablet, Rfl: 5   atorvastatin (LIPITOR) 80 MG tablet, TAKE ONE TABLET BY MOUTH ONCE DAILY, Disp: 90 tablet, Rfl: 1   ferrous sulfate 325 (65 FE) MG tablet, Take 325 mg by mouth daily with breakfast., Disp: , Rfl:    loperamide (IMODIUM) 2 MG capsule, Take 2 mg by mouth as needed for diarrhea or loose stools., Disp: , Rfl:    megestrol (MEGACE) 40 MG tablet, Take 1 tablet (40 mg total) by mouth 2 (two) times daily., Disp: 60 tablet, Rfl: 1   potassium chloride SA (KLOR-CON) 20 MEQ tablet, Take 1 tablet (20 mEq total) by mouth daily., Disp: 30 tablet, Rfl: 2   capecitabine (XELODA) 500 MG tablet, Take 2 tablets (1033m) by mouth in AM and 1 tablet (5021m in PM. Take with food. Take for 14 days, then hold for 7 days. Repeat  every 21 days. (Patient not taking: Reported on 03/18/2021), Disp: 42 tablet, Rfl: 0  Physical exam:  Vitals:   03/18/21 1338  BP: 126/76  Pulse: 69  Resp: 16  Temp: 98.6 F (37 C)  Weight: 107 lb 12.8 oz (48.9 kg)   Physical Exam Constitutional:      Appearance: Normal appearance.  HENT:     Head: Normocephalic and atraumatic.  Eyes:     Pupils: Pupils are equal, round, and reactive to light.  Cardiovascular:     Rate and Rhythm: Normal rate and regular rhythm.     Heart sounds: Normal heart sounds. No murmur heard. Pulmonary:     Effort: Pulmonary effort is normal.     Breath sounds: Normal breath sounds. No wheezing.  Abdominal:     General: Bowel sounds are normal. There is no distension.     Palpations: Abdomen is soft.     Tenderness: There is no abdominal tenderness.  Musculoskeletal:        General: Normal range of motion.     Cervical back: Normal range of motion.  Skin:    General: Skin is warm and dry.     Findings: No rash.  Neurological:     Mental Status: She is alert and oriented to  person, place, and time.  Psychiatric:        Judgment: Judgment normal.     CMP Latest Ref Rng & Units 03/18/2021  Glucose 70 - 99 mg/dL 101(H)  BUN 8 - 23 mg/dL 20  Creatinine 0.44 - 1.00 mg/dL 0.99  Sodium 135 - 145 mmol/L 140  Potassium 3.5 - 5.1 mmol/L 3.7  Chloride 98 - 111 mmol/L 104  CO2 22 - 32 mmol/L 25  Calcium 8.9 - 10.3 mg/dL 9.4  Total Protein 6.5 - 8.1 g/dL 7.7  Total Bilirubin 0.3 - 1.2 mg/dL 1.7(H)  Alkaline Phos 38 - 126 U/L 51  AST 15 - 41 U/L 40  ALT 0 - 44 U/L 49(H)   CBC Latest Ref Rng & Units 03/18/2021  WBC 4.0 - 10.5 K/uL 8.3  Hemoglobin 12.0 - 15.0 g/dL 10.1(L)  Hematocrit 36.0 - 46.0 % 32.3(L)  Platelets 150 - 400 K/uL 325    No images are attached to the encounter.  CT CHEST ABDOMEN PELVIS W CONTRAST  Result Date: 03/09/2021 CLINICAL DATA:  Colon cancer diagnosed in March. Recently finished chemotherapy. Prior ovarian cancer. EXAM: CT CHEST, ABDOMEN, AND PELVIS WITH CONTRAST TECHNIQUE: Multidetector CT imaging of the chest, abdomen and pelvis was performed following the standard protocol during bolus administration of intravenous contrast. CONTRAST:  83m OMNIPAQUE IOHEXOL 300 MG/ML  SOLN COMPARISON:  08/06/2020 chest CT.  Abdominopelvic CT of 06/24/2020 FINDINGS: CT CHEST FINDINGS Cardiovascular: Aortic atherosclerosis. Tortuous thoracic aorta. Normal heart size, without pericardial effusion. Median sternotomy for CABG. No central pulmonary embolism, on this non-dedicated study. Mediastinum/Nodes: No supraclavicular adenopathy. No mediastinal or hilar adenopathy. Lungs/Pleura: No pleural fluid. Biapical pleuroparenchymal scarring. Right lower lobe macrolobulated nodule measures 1.3 x 0.7 cm on 146/3 and is similar 2 1.3 x 0.5 cm on the prior exam. No new pulmonary nodule. Similar mild subpleural reticulation with slight craniocaudal gradient. Musculoskeletal: No acute osseous abnormality. CT ABDOMEN PELVIS FINDINGS Hepatobiliary: Normal liver. The  gallbladder is partially contracted, without calcified stone or biliary duct dilatation. Pancreas: Normal, without mass or ductal dilatation. Spleen: Normal in size, without focal abnormality. Adrenals/Urinary Tract: Normal adrenal glands. Bilateral renal cysts, including interpolar left renal 6.0 cm lesion. Dominant stone in  the right renal pelvis again identified at 1.8 cm. Mild caliectasis without hydroureter. Other smaller collecting system calculi. Normal urinary bladder. Stomach/Bowel: Normal stomach, without wall thickening. Scattered colonic diverticula. Right hemicolectomy. No bowel obstruction. Previously described fluid collections have resolved. Vascular/Lymphatic: Advanced aortic and branch vessel atherosclerosis. Abdominal retroperitoneal node dissection. Pelvic sidewall node dissection. No abdominopelvic adenopathy. Reproductive: Hysterectomy.  No adnexal mass. Other: No significant free fluid. No evidence of omental or peritoneal disease. Musculoskeletal: Convex right lumbar spine curvature. Bilateral hip osteoarthritis. IMPRESSION: CT CHEST IMPRESSION 1. Similar nonspecific right lower lobe pulmonary nodule over 5 months. Stability favors a benign etiology with primary malignancy or metastatic disease felt less likely. Recommend attention on follow-up. 2. Otherwise, no evidence of metastatic disease within the chest. 3. Mild interstitial lung disease, possibly nonspecific interstitial pneumonitis. 4.  Aortic Atherosclerosis (ICD10-I70.0). CT ABDOMEN AND PELVIS IMPRESSION 1. Status post right hemicolectomy, without recurrent or metastatic disease. 2. Dominant right renal pelvic stone with mild secondary caliectasis. Other smaller right renal collecting system calculi. Electronically Signed   By: Abigail Miyamoto M.D.   On: 03/09/2021 11:22     Assessment and plan- Patient is a 85 y.o. female  with history of stage IIIc adenocarcinoma of the colon s/p 8 cycles of adjuvant Xeloda.  Her last cycle was on  02/05/2021.  She is here to review most recent CT chest abdomen pelvis.  Clinically she is doing well overall.  Completed 8 cycles of adjuvant Xeloda with great tolerance.  CT scan from 03/08/2021 shows a right lower lobe pulmonary nodule that has been stable over the past 5 months likely representing a benign etiology rather than metastatic or new primary.  No recurrent or metastatic disease in the abdomen or pelvis.  Based on NCCN guidelines for stage III cecal cancer she will need physical examination and labs every 3 to 6 months for 2 years followed by every 6 months for total of 5.  She will need imaging CT chest abdomen and pelvis every 6 to 12 months for total of 5 years.  Colonoscopy in 1 year after surgery.  Reviewed labs which have improved greatly since stopping Xeloda.  Anemia- Previously receiving B12 injections.  We will get her set up for additional B12 injections in the future.  Disposition- RTC in 3 months for follow-up with Dr. Janese Banks with labs and assessment.    Visit Diagnosis 1. Cecal cancer ([place])   2. Microcytic anemia    Faythe Casa, NP 03/18/2021 2:45 PM

## 2021-03-19 ENCOUNTER — Ambulatory Visit
Admission: EM | Admit: 2021-03-19 | Discharge: 2021-03-19 | Disposition: A | Discharge status: Home / Self Care | Payer: Medicare PPO | Attending: Emergency Medicine | Admitting: Emergency Medicine

## 2021-03-19 ENCOUNTER — Encounter: Payer: Self-pay | Admitting: Emergency Medicine

## 2021-03-19 ENCOUNTER — Ambulatory Visit (INDEPENDENT_AMBULATORY_CARE_PROVIDER_SITE_OTHER): Payer: Medicare PPO

## 2021-03-19 ENCOUNTER — Ambulatory Visit: Payer: Self-pay

## 2021-03-19 ENCOUNTER — Other Ambulatory Visit: Payer: Self-pay

## 2021-03-19 DIAGNOSIS — M810 Age-related osteoporosis without current pathological fracture: Secondary | ICD-10-CM | POA: Diagnosis not present

## 2021-03-19 DIAGNOSIS — S93402A Sprain of unspecified ligament of left ankle, initial encounter: Secondary | ICD-10-CM | POA: Diagnosis not present

## 2021-03-19 DIAGNOSIS — M25572 Pain in left ankle and joints of left foot: Secondary | ICD-10-CM

## 2021-03-19 LAB — CA 125: Cancer Antigen (CA) 125: 28.7 U/mL (ref 0.0–38.1)

## 2021-03-19 NOTE — ED Provider Notes (Signed)
[facility]    CSN: [id number] Arrival date & time: 03/19/21  1139      History   Chief Complaint Chief Complaint  Patient presents with   Ankle Pain    left   Fall    HPI Phyllis Ross is a 85 y.o. female.   HPI  85 year old female here for evaluation of pain and swelling in her left ankle.  Patient reports that she lay down for a nap yesterday afternoon and when she got up her left leg was asleep.  When she went to step her leg gave out and it caused her to fall.  She is since been experiencing pain in the outside of her left ankle when she everts her foot only.  She is able to bear weight.  She has not had any swelling or color changes.  Past Medical History:  Diagnosis Date   Anemia    Anxiety    Cancer ([place])    Hyperlipidemia    Hypertension    MI (myocardial infarction) ([place]) 2008   triple bypass   Osteoporosis    Pneumonia    as a baby   Rheumatic fever     Patient Active Problem List   Diagnosis Date Noted   B12 deficiency 09/16/2020   Goals of care, counseling/discussion 08/07/2020   Microcytic anemia 07/23/2020   Status post partial colectomy 07/02/2020   Protein-calorie malnutrition, severe 06/24/2020   Cecal cancer ([place]) 06/15/2020   Iron deficiency anemia due to chronic blood loss    Neoplasm of lower gastrointestinal tract    Acute gastritis with hemorrhage    Personal history of other malignant neoplasm of skin 01/21/2019   Atherosclerosis of abdominal aorta ([place]) 12/26/2017   Age-related osteoporosis without current pathological fracture 06/16/2016   Encounter for follow-up surveillance of ovarian cancer 05/20/2015   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   Wedging of vertebra ([place]) 10/13/2014   Polypharmacy 10/13/2014   Encounter for general adult medical examination without abnormal findings 10/13/2014   Anxiety 10/13/2014   Essential hypertension 08/27/2014   Bradycardia 08/27/2014   Coronary  artery disease of native heart with stable angina pectoris ([place]) 08/27/2014   MI (mitral incompetence) 08/27/2014   TI (tricuspid incompetence) 08/27/2014   Combined fat and carbohydrate induced hyperlipemia 08/13/2014   HTN (hypertension), malignant 08/11/2014   Chest pain 08/11/2014   CAD (coronary artery disease) of artery bypass graft 08/11/2014    Past Surgical History:  Procedure Laterality Date   COLONOSCOPY  2011   normal- Dr Vira Agar   COLONOSCOPY WITH PROPOFOL N/A 06/04/2020   Procedure: COLONOSCOPY WITH PROPOFOL;  Surgeon: Lucilla Lame, MD;  Location: [place];  Service: Endoscopy;  Laterality: N/A;   CORONARY ARTERY BYPASS GRAFT     ESOPHAGOGASTRODUODENOSCOPY (EGD) WITH PROPOFOL N/A 06/04/2020   Procedure: ESOPHAGOGASTRODUODENOSCOPY (EGD) WITH PROPOFOL;  Surgeon: Lucilla Lame, MD;  Location: [place];  Service: Endoscopy;  Laterality: N/A;   VAGINAL HYSTERECTOMY     DUB    OB History   No obstetric history on file.      Home Medications    Prior to Admission medications   Medication Sig Start Date End Date Taking? Authorizing Provider  atorvastatin (LIPITOR) 80 MG tablet TAKE ONE TABLET BY MOUTH ONCE DAILY 01/27/21  Yes Juline Patch, MD  ferrous sulfate 325 (65 FE) MG tablet Take 325 mg by mouth daily with breakfast.   Yes [provider]  potassium chloride SA (KLOR-CON) 20 MEQ tablet  Take 1 tablet (20 mEq total) by mouth daily. 02/19/21  Yes Darl Pikes, RPH-CPP  ALPRAZolam Duanne Moron) 0.25 MG tablet Take 1 tablet (0.25 mg total) by mouth daily as needed for anxiety. 07/20/20   Juline Patch, MD  loperamide (IMODIUM) 2 MG capsule Take 2 mg by mouth as needed for diarrhea or loose stools.    [provider]  megestrol (MEGACE) 40 MG tablet Take 1 tablet (40 mg total) by mouth 2 (two) times daily. 09/16/20   Sindy Guadeloupe, MD    Family History Family History  Problem Relation Age of Onset   Heart attack Mother    Heart  attack Maternal Uncle    Heart attack Maternal Uncle     Social History Social History   Tobacco Use   Smoking status: Never   Smokeless tobacco: Never  Vaping Use   Vaping Use: Never used  Substance Use Topics   Alcohol use: No   Drug use: No     Allergies   Trazodone and nefazodone   Review of Systems Review of Systems  Constitutional:  Negative for activity change and appetite change.  Musculoskeletal:  Positive for arthralgias. Negative for joint swelling.  Skin:  Negative for color change and wound.  Neurological:  Negative for weakness and numbness.  Hematological: Negative.   Psychiatric/Behavioral: Negative.      Physical Exam Triage Vital Signs ED Triage Vitals  Enc Vitals Group     BP 03/19/21 1258 (!) 149/73     Pulse Rate 03/19/21 1258 71     Resp 03/19/21 1258 14     Temp 03/19/21 1258 98.1 F (36.7 C)     Temp Source 03/19/21 1258 Oral     SpO2 03/19/21 1258 100 %     Weight 03/19/21 1253 107 lb 12.8 oz (48.9 kg)     Height 03/19/21 1253 5\' 6"  (1.676 m)     Head Circumference --      Peak Flow --      Pain Score 03/19/21 1253 0     Pain Loc --      Pain Edu? --      Excl. in [place]? --    No data found.  Updated Vital Signs BP (!) 149/73 (BP Location: Left Arm)    Pulse 71    Temp 98.1 F (36.7 C) (Oral)    Resp 14    Ht 5\' 6"  (1.676 m)    Wt 107 lb 12.8 oz (48.9 kg)    SpO2 100%    BMI 17.40 kg/m   Visual Acuity Right Eye Distance:   Left Eye Distance:   Bilateral Distance:    Right Eye Near:   Left Eye Near:    Bilateral Near:     Physical Exam Vitals and nursing note reviewed.  Constitutional:      General: She is not in acute distress.    Appearance: Normal appearance. She is normal weight. She is not ill-appearing.  HENT:     Head: Normocephalic and atraumatic.  Musculoskeletal:        General: Tenderness present. No swelling or deformity. Normal range of motion.  Skin:    General: Skin is warm and dry.     Capillary  Refill: Capillary refill takes less than 2 seconds.     Findings: No bruising or erythema.  Neurological:     General: No focal deficit present.     Mental Status: She is alert and oriented to person,  place, and time.     Sensory: No sensory deficit.     Motor: No weakness.  Psychiatric:        Mood and Affect: Mood normal.        Behavior: Behavior normal.        Thought Content: Thought content normal.        Judgment: Judgment normal.     UC Treatments / Results  Labs (all labs ordered are listed, but only abnormal results are displayed) Labs Reviewed - No data to display  EKG   Radiology DG Ankle Complete Left  Result Date: 03/19/2021 CLINICAL DATA:  Ankle pain after falling yesterday. EXAM: LEFT ANKLE COMPLETE - 3+ VIEW COMPARISON:  None. FINDINGS: The bones are demineralized. No definite evidence of acute fracture or dislocation. Small well corticated ossific densities adjacent to the lateral malleolus appear nonacute. There is also mild irregularity along the dorsal aspect of the navicular on the lateral views. The joint spaces are preserved. No focal soft tissue swelling identified. IMPRESSION: No acute osseous findings identified at the ankle. Mild dorsal irregularity of the navicular on the lateral view could reflect a small avulsion fracture, age indeterminate. Electronically Signed   By: Richardean Sale M.D.   On: 03/19/2021 13:29    Procedures Procedures (including critical care time)  Medications Ordered in UC Medications - No data to display  Initial Impression / Assessment and Plan / UC Course  I have reviewed the triage vital signs and the nursing notes.  Pertinent labs & imaging results that were available during my care of the patient were reviewed by me and considered in my medical decision making (see chart for details).  Patient is a nontoxic-appearing 85 year old female here for evaluation of pain and swelling to her left ankle that occurred as a result  of a fall yesterday.  It was a ground-level fall after she got up from a nap and her leg was asleep.  Her leg gave out prior to the fall.  No loss of consciousness or head injury.  She denies any numbness or tingling in her foot and she states she is able to bear weight.  She states that she has no pain with range of motion of her ankle unless she goes to evert her foot and then she has pain in the lateral aspect of her ankle.  Patient's left ankle is in normal anatomical alignment and there is no appreciable edema.  There is also no erythema or ecchymosis noted.  DP and PT pulses are 2+.  Patient is full range of motion sensation in her toes and ankle.  Radiograph was obtained at triage.  Left ankle radiograph independently reviewed and evaluated by me.  Impression: No evidence of fracture or dislocation.  Awaiting radiology overread. Radiology impression is no acute osseous findings identified at the ankle.  Mild dorsal irregularity of the navicular on the lateral view could reflect a small avulsion fracture of undetermined age.  Patient is not having any pain irregularities at this site and I do not believe it is acute.  We will discharge patient home with a diagnosis of a sprained ankle and follow conservative measures of rest, ice, elevation, compression, and using Tylenol according to the package instructions as needed for pain.   Final Clinical Impressions(s) / UC Diagnoses   Final diagnoses:  Sprain of left ankle, unspecified ligament, initial encounter     Discharge Instructions      Your x-rays did not demonstrate any fractures of  your ankle bones.  I believe that your pain is coming from soft tissue inflammation as result of an ankle sprain that she suffered from your fall.  Keep your left ankle elevated as much as possible to help decrease swelling and aid in pain relief.  You can apply ice for 20 minutes at a time 2-3 times a day to your left ankle to help decrease pain and  swelling.  Use ice for the first 48 hours and after that switch to moist heat.  The moist heat will improve blood flow to the area you heal faster.  Take over-the-counter Tylenol per the package instructions as needed for pain.  Return for reevaluation for new or worsening symptoms.     ED Prescriptions   None    PDMP not reviewed this encounter.   Margarette Canada, NP 03/19/21 1346

## 2021-03-19 NOTE — Discharge Instructions (Addendum)
Your x-rays did not demonstrate any fractures of your ankle bones.  I believe that your pain is coming from soft tissue inflammation as result of an ankle sprain that she suffered from your fall.  Keep your left ankle elevated as much as possible to help decrease swelling and aid in pain relief.  You can apply ice for 20 minutes at a time 2-3 times a day to your left ankle to help decrease pain and swelling.  Use ice for the first 48 hours and after that switch to moist heat.  The moist heat will improve blood flow to the area you heal faster.  Take over-the-counter Tylenol per the package instructions as needed for pain.  Return for reevaluation for new or worsening symptoms.

## 2021-03-19 NOTE — ED Triage Notes (Signed)
Patient states that she took a nap around 4:30pm and states that her left leg went to sleep and when she went to stand she fell.  Patient c/o pain and swelling in her left ankle.

## 2021-03-19 NOTE — Telephone Encounter (Signed)
°  Chief Complaint: ankle injury r/t fall Symptoms: ankle pain, swelling, bruising Frequency: 1 day Pertinent Negatives: Patient denies hitting head Disposition: [] ED /[x] Urgent Care (no appt availability in office) / [] Appointment(In office/virtual)/ []  [place]/ [] Home Care/ [] Refused Recommended Disposition  Additional Notes: Pt states she fell d/t leg being asleep after a nap and got up and tripped. Fell on R side and R ankle is hurting when walking or certain movement and swelling and slightly bruised. Pt states she didn't hit her head. Advised pt no appt until 03/23/21 and called spoke with Levada Dy, [facility] who verified office is closed after lunch today so advised pt to go to UC to be seen and get XR done. Pt states she will go there today. Encouraged her to keep using ice and elevating as much as possible. Pt verbalized understanding    Summary: Patient [place] - Ankle Pain   Patient fell yesterday and did not hit her head but experiencing ankle pain no other symptoms.      Reason for Disposition  [1] MODERATE weakness (i.e., interferes with work, school, normal activities) AND [2] new-onset or worsening  Answer Assessment - Initial Assessment Questions 1. MECHANISM: "How did the fall happen?"     tripped 2. DOMESTIC VIOLENCE AND ELDER ABUSE SCREENING: "Did you fall because someone pushed you or tried to hurt you?" If Yes, ask: "Are you safe now?"     no 3. ONSET: "When did the fall happen?" (e.g., minutes, hours, or days ago)     yesterday 4. LOCATION: "What part of the body hit the ground?" (e.g., back, buttocks, head, hips, knees, hands, head, stomach)     Whole right side, didn't hit head 5. INJURY: "Did you hurt (injure) yourself when you fell?" If Yes, ask: "What did you injure? Tell me more about this?" (e.g., body area; type of injury; pain severity)"     ankle 6. PAIN: "Is there any pain?" If Yes, ask: "How bad is the pain?" (e.g., Scale 1-10; or mild,  moderate,  severe)   - NONE (0): No pain   - MILD (1-3): Doesn't interfere with normal activities    - MODERATE (4-7): Interferes with normal activities or awakens from sleep    - SEVERE (8-10): Excruciating pain, unable to do any normal activities      Mild with movement 7. SIZE: For cuts, bruises, or swelling, ask: "How large is it?" (e.g., inches or centimeters)      Bruising and swelling 9. OTHER SYMPTOMS: "Do you have any other symptoms?" (e.g., dizziness, fever, weakness; new onset or worsening).      No 10. CAUSE: "What do you think caused the fall (or falling)?" (e.g., tripped, dizzy spell)       Tripped d/t to leg asleep  Protocols used: Falls and [facility]

## 2021-03-23 ENCOUNTER — Inpatient Hospital Stay: Payer: Medicare PPO | Attending: Oncology

## 2021-04-02 DIAGNOSIS — Z7189 Other specified counseling: Secondary | ICD-10-CM | POA: Diagnosis not present

## 2021-04-02 DIAGNOSIS — Z85828 Personal history of other malignant neoplasm of skin: Secondary | ICD-10-CM | POA: Diagnosis not present

## 2021-04-02 DIAGNOSIS — D1801 Hemangioma of skin and subcutaneous tissue: Secondary | ICD-10-CM | POA: Diagnosis not present

## 2021-04-02 DIAGNOSIS — Z872 Personal history of diseases of the skin and subcutaneous tissue: Secondary | ICD-10-CM | POA: Diagnosis not present

## 2021-04-02 DIAGNOSIS — L821 Other seborrheic keratosis: Secondary | ICD-10-CM | POA: Diagnosis not present

## 2021-04-02 DIAGNOSIS — L814 Other melanin hyperpigmentation: Secondary | ICD-10-CM | POA: Diagnosis not present

## 2021-04-02 DIAGNOSIS — Z08 Encounter for follow-up examination after completed treatment for malignant neoplasm: Secondary | ICD-10-CM | POA: Diagnosis not present

## 2021-04-02 DIAGNOSIS — D485 Neoplasm of uncertain behavior of skin: Secondary | ICD-10-CM | POA: Diagnosis not present

## 2021-04-02 DIAGNOSIS — L570 Actinic keratosis: Secondary | ICD-10-CM | POA: Diagnosis not present

## 2021-04-07 ENCOUNTER — Telehealth: Payer: Self-pay | Admitting: *Deleted

## 2021-04-07 ENCOUNTER — Telehealth: Payer: Self-pay | Admitting: Family Medicine

## 2021-04-07 NOTE — Telephone Encounter (Signed)
Copied from [place] [id number]. Topic: General - Call Back - No Documentation >> Apr 07, 2021  2:00 PM Erick Blinks wrote: Reason for CRM: Pt is requesting to speak to Dr. Ronnald Ramp prior to her appt tomorrow. She has "insurance people coming to her house" and has questions.   Best contact: [phone]

## 2021-04-07 NOTE — Telephone Encounter (Signed)
Can we see if this can be self administered by her at home? If not we will bring her in monthly

## 2021-04-07 NOTE — Telephone Encounter (Signed)
Patient called and states that we were to set her up for B12 injection after checking with insurance and she has not heard from [place] and is asking if we are going to give her B 12 or not. Please advise  Anemia- Previously receiving B12 injections.  We will get her set up for additional B12 injections in the future.   Disposition- RTC in 3 months for follow-up with Dr. Janese Banks with labs and assessment.     Visit Diagnosis 1. Cecal cancer ([place])   2. Microcytic anemia     Faythe Casa, NP 03/18/2021 2:45 PM

## 2021-04-08 ENCOUNTER — Other Ambulatory Visit: Payer: Self-pay

## 2021-04-08 ENCOUNTER — Ambulatory Visit: Payer: Medicare PPO | Admitting: Family Medicine

## 2021-04-08 DIAGNOSIS — E7849 Other hyperlipidemia: Secondary | ICD-10-CM | POA: Diagnosis not present

## 2021-04-08 MED ORDER — ATORVASTATIN CALCIUM 80 MG PO TABS: ORAL_TABLET | ORAL | 1 refills | Status: DC

## 2021-04-08 NOTE — Progress Notes (Signed)
Date:  04/08/2021   Name:  Phyllis Ross   DOB:  Aug 03, 1935   MRN:  [id number]   Chief Complaint: Hyperlipidemia  Hyperlipidemia This is a chronic problem. The current episode started more than 1 year ago. The problem is controlled. Recent lipid tests were reviewed and are normal. She has no history of chronic renal disease, diabetes, hypothyroidism, liver disease, obesity or nephrotic syndrome. Pertinent negatives include no chest pain, focal sensory loss, focal weakness, leg pain, myalgias or shortness of breath. Current antihyperlipidemic treatment includes statins. The current treatment provides mild improvement of lipids. There are no compliance problems.  Risk factors for coronary artery disease include dyslipidemia.   Lab Results  Component Value Date   NA 140 03/18/2021   K 3.7 03/18/2021   CO2 25 03/18/2021   GLUCOSE 101 (H) 03/18/2021   BUN 20 03/18/2021   CREATININE 0.99 03/18/2021   CALCIUM 9.4 03/18/2021   GFRNONAA 57 (L) 03/18/2021   Lab Results  Component Value Date   CHOL 118 10/07/2020   HDL 43 10/07/2020   LDLCALC 53 10/07/2020   TRIG 109 10/07/2020   CHOLHDL 2.7 10/07/2020   No results found for: TSH Lab Results  Component Value Date   HGBA1C 5.5 04/07/2020   Lab Results  Component Value Date   WBC 8.3 03/18/2021   HGB 10.1 (L) 03/18/2021   HCT 32.3 (L) 03/18/2021   MCV 78.2 (L) 03/18/2021   PLT 325 03/18/2021   Lab Results  Component Value Date   ALT 49 (H) 03/18/2021   AST 40 03/18/2021   ALKPHOS 51 03/18/2021   BILITOT 1.7 (H) 03/18/2021   No results found for: 25OHVITD2, 25OHVITD3, VD25OH   Review of Systems  Constitutional: Negative.  Negative for chills, fatigue, fever and unexpected weight change.  HENT:  Negative for congestion, ear discharge, ear pain, rhinorrhea, sinus pressure, sneezing and sore throat.   Eyes:  Negative for photophobia, pain, discharge, redness and itching.  Respiratory:  Negative for cough, shortness of  breath, wheezing and stridor.   Cardiovascular:  Negative for chest pain.  Gastrointestinal:  Negative for abdominal pain, blood in stool, constipation, diarrhea, nausea and vomiting.  Endocrine: Negative for cold intolerance, heat intolerance, polydipsia, polyphagia and polyuria.  Genitourinary:  Negative for dysuria, flank pain, frequency, hematuria, menstrual problem, pelvic pain, urgency, vaginal bleeding and vaginal discharge.  Musculoskeletal:  Negative for arthralgias, back pain and myalgias.  Skin:  Negative for rash.  Allergic/Immunologic: Negative for environmental allergies and food allergies.  Neurological:  Negative for dizziness, focal weakness, weakness, light-headedness, numbness and headaches.  Hematological:  Negative for adenopathy. Does not bruise/bleed easily.  Psychiatric/Behavioral:  Negative for dysphoric mood. The patient is not nervous/anxious.    Patient Active Problem List   Diagnosis Date Noted   B12 deficiency 09/16/2020   Goals of care, counseling/discussion 08/07/2020   Microcytic anemia 07/23/2020   Status post partial colectomy 07/02/2020   Protein-calorie malnutrition, severe 06/24/2020   Cecal cancer ([place]) 06/15/2020   Iron deficiency anemia due to chronic blood loss    Neoplasm of lower gastrointestinal tract    Acute gastritis with hemorrhage    Personal history of other malignant neoplasm of skin 01/21/2019   Atherosclerosis of abdominal aorta ([place]) 12/26/2017   Age-related osteoporosis without current pathological fracture 06/16/2016   Encounter for follow-up surveillance of ovarian cancer 05/20/2015   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   Wedging of vertebra ([place]) 10/13/2014   Polypharmacy 10/13/2014   Encounter  for general adult medical examination without abnormal findings 10/13/2014   Anxiety 10/13/2014   Essential hypertension 08/27/2014   Bradycardia 08/27/2014   Coronary artery disease of native heart with  stable angina pectoris ([place]) 08/27/2014   MI (mitral incompetence) 08/27/2014   TI (tricuspid incompetence) 08/27/2014   Combined fat and carbohydrate induced hyperlipemia 08/13/2014   HTN (hypertension), malignant 08/11/2014   Chest pain 08/11/2014   CAD (coronary artery disease) of artery bypass graft 08/11/2014    Allergies  Allergen Reactions   Trazodone And Nefazodone Other (See Comments)    Made her pass out    Past Surgical History:  Procedure Laterality Date   COLONOSCOPY  2011   normal- Dr Vira Agar   COLONOSCOPY WITH PROPOFOL N/A 06/04/2020   Procedure: COLONOSCOPY WITH PROPOFOL;  Surgeon: Lucilla Lame, MD;  Location: [facility];  Service: Endoscopy;  Laterality: N/A;   CORONARY ARTERY BYPASS GRAFT     ESOPHAGOGASTRODUODENOSCOPY (EGD) WITH PROPOFOL N/A 06/04/2020   Procedure: ESOPHAGOGASTRODUODENOSCOPY (EGD) WITH PROPOFOL;  Surgeon: Lucilla Lame, MD;  Location: [facility];  Service: Endoscopy;  Laterality: N/A;   VAGINAL HYSTERECTOMY     DUB    Social History   Tobacco Use   Smoking status: Never   Smokeless tobacco: Never  Vaping Use   Vaping Use: Never used  Substance Use Topics   Alcohol use: No   Drug use: No     Medication list has been reviewed and updated.  Current Meds  Medication Sig   atorvastatin (LIPITOR) 80 MG tablet TAKE ONE TABLET BY MOUTH ONCE DAILY   ferrous sulfate 325 (65 FE) MG tablet Take 325 mg by mouth daily with breakfast.   loperamide (IMODIUM) 2 MG capsule Take 2 mg by mouth as needed for diarrhea or loose stools.   megestrol (MEGACE) 40 MG tablet Take 1 tablet (40 mg total) by mouth 2 (two) times daily.   potassium chloride SA (KLOR-CON) 20 MEQ tablet Take 1 tablet (20 mEq total) by mouth daily.   vitamin B-12 (CYANOCOBALAMIN) 1000 MCG tablet Take 1,000 mcg by mouth daily.    PHQ 2/9 Scores 04/08/2021 02/03/2021 07/20/2020 02/03/2020  PHQ - 2 Score 0 0 0 0  PHQ- 9 Score 0 - 5 -    GAD 7 : Generalized Anxiety Score  04/08/2021 07/20/2020 09/24/2019 03/21/2019  Nervous, Anxious, on Edge 0 2 0 0  Control/stop worrying 0 2 0 0  Worry too much - different things 0 2 0 0  Trouble relaxing 0 0 0 0  Restless 0 0 0 0  Easily annoyed or irritable 0 1 0 0  Afraid - awful might happen 0 1 0 0  Total GAD 7 Score 0 8 0 0  Anxiety Difficulty Not difficult at all Not difficult at all - -    BP Readings from Last 3 Encounters:  04/08/21 128/76  03/19/21 (!) 149/73  03/18/21 126/76    Physical Exam Vitals and nursing note reviewed.  Constitutional:      Appearance: She is well-developed.  HENT:     Head: Normocephalic.     Right Ear: Tympanic membrane and external ear normal.     Left Ear: Tympanic membrane and external ear normal.     Nose: Nose normal. No congestion or rhinorrhea.  Eyes:     General: Lids are everted, no foreign bodies appreciated. No scleral icterus.       Left eye: No foreign body or hordeolum.     Conjunctiva/sclera: Conjunctivae  normal.     Right eye: Right conjunctiva is not injected.     Left eye: Left conjunctiva is not injected.     Pupils: Pupils are equal, round, and reactive to light.  Neck:     Thyroid: No thyromegaly.     Vascular: No carotid bruit or JVD.     Trachea: No tracheal deviation.  Cardiovascular:     Rate and Rhythm: Normal rate and regular rhythm.     Heart sounds: Normal heart sounds. No murmur heard.   No friction rub. No gallop.  Pulmonary:     Effort: Pulmonary effort is normal. No respiratory distress.     Breath sounds: Normal breath sounds. No wheezing, rhonchi or rales.  Abdominal:     General: Bowel sounds are normal.     Palpations: Abdomen is soft. There is no mass.     Tenderness: There is no abdominal tenderness. There is no guarding or rebound.  Musculoskeletal:        General: No tenderness. Normal range of motion.     Cervical back: Normal range of motion and neck supple.  Lymphadenopathy:     Cervical: No cervical adenopathy.  Skin:     General: Skin is warm.     Findings: No rash.  Neurological:     Mental Status: She is alert and oriented to person, place, and time.     Cranial Nerves: No cranial nerve deficit.     Deep Tendon Reflexes: Reflexes normal.  Psychiatric:        Mood and Affect: Mood is not anxious or depressed.    Wt Readings from Last 3 Encounters:  04/08/21 110 lb (49.9 kg)  03/19/21 107 lb 12.8 oz (48.9 kg)  03/18/21 107 lb 12.8 oz (48.9 kg)    BP 128/76    Pulse 72    Ht 5\' 6"  (1.676 m)    Wt 110 lb (49.9 kg)    BMI 17.75 kg/m   Assessment and Plan:  1. Familial multiple lipoprotein-type hyperlipidemia Chronic.  Controlled.  Stable.  Continue atorvastatin 80 mg once a day. - atorvastatin (LIPITOR) 80 MG tablet; TAKE ONE TABLET BY MOUTH ONCE DAILY  Dispense: 90 tablet; Refill: 1

## 2021-04-09 ENCOUNTER — Inpatient Hospital Stay: Payer: Medicare PPO | Attending: Oncology

## 2021-04-09 DIAGNOSIS — E538 Deficiency of other specified B group vitamins: Secondary | ICD-10-CM | POA: Insufficient documentation

## 2021-04-09 MED ORDER — CYANOCOBALAMIN 1000 MCG/ML IJ SOLN
1000.0000 ug | INTRAMUSCULAR | Status: DC
  Administered 2021-04-09: 1000 ug via INTRAMUSCULAR
  Filled 2021-04-09: qty 1

## 2021-04-13 ENCOUNTER — Other Ambulatory Visit: Payer: Self-pay | Admitting: *Deleted

## 2021-04-14 ENCOUNTER — Telehealth: Payer: Self-pay | Admitting: *Deleted

## 2021-04-14 NOTE — Telephone Encounter (Signed)
Called pt and she wants inj. B12 at PCP office because they are in [place] where pt lives and if she comes to cancer center in [place] it is farther to drive. I have called Dr. Ronnald Ramp in [place] PCP and spoke  to amber and she will pass message along to the doctor and let us know. Pt is aware that I am waiting for response from PCP

## 2021-04-14 NOTE — Telephone Encounter (Signed)
Patient called asking if we could order her B 12 injection for her to get at home, she has a neighbor who is a nurse that can give her the injection if she has the medicine. Please advise

## 2021-04-14 NOTE — Telephone Encounter (Signed)
She initially wanted to come here but now has changed her mind. Please send b12 injections to pharmacy

## 2021-04-15 ENCOUNTER — Telehealth: Payer: Self-pay

## 2021-04-15 NOTE — Telephone Encounter (Signed)
Phyllis Ross at cancer center and asked that a letter stating pt has vitamin B12 deficiency and needs to proceed with monthly B12 injections starting Feb 6, be faxed to [place]. Anderson Malta said she would do this. [phone] is her call back

## 2021-04-15 NOTE — Telephone Encounter (Signed)
Copied from [place] [phone] [phone]. Topic: General - Other >> Apr 14, 2021  4:49 PM Fields, Museum/gallery conservator R wrote: Reason for CRM: Judeen Hammans from cancer center seeing if pt can get b12 shot monthly with pcp since cancer center is too far from her house.

## 2021-04-16 ENCOUNTER — Encounter: Payer: Self-pay | Admitting: Oncology

## 2021-04-16 NOTE — Telephone Encounter (Signed)
Signing encounter, See previous note on 07/27/20

## 2021-04-16 NOTE — Telephone Encounter (Signed)
Letter sent to PCP requesting B12 injections at PCP's office

## 2021-04-19 ENCOUNTER — Telehealth: Payer: Self-pay

## 2021-04-19 NOTE — Telephone Encounter (Signed)
A letter has been received today to proceed with Vitamin B12 injections monthly, from Dr Rao/ cancer center. Pt will start her first with [place] on 05/10/21. Letter from Janese Banks has been scanned to the chart.

## 2021-04-21 ENCOUNTER — Telehealth: Payer: Self-pay | Admitting: *Deleted

## 2021-04-21 NOTE — Telephone Encounter (Signed)
RN called and notified pt of appointment at Dr Otilio Miu office on 05/10/21 at 140 pm for B12 injection.  Pt verbalized understanding.

## 2021-04-21 NOTE — Telephone Encounter (Signed)
RN called office to verify pt appointment for 05/10/21.  Appt made for 05/10/21 at 140 pm per Juliann Pulse. RN to call pt and give appointment date and time.

## 2021-04-22 ENCOUNTER — Other Ambulatory Visit: Payer: Self-pay | Admitting: *Deleted

## 2021-04-22 NOTE — Telephone Encounter (Signed)
Would prefer that patient does not take this long term. Please ask her to hold off for now

## 2021-05-03 ENCOUNTER — Other Ambulatory Visit: Payer: Self-pay | Admitting: *Deleted

## 2021-05-03 DIAGNOSIS — E876 Hypokalemia: Secondary | ICD-10-CM

## 2021-05-03 MED ORDER — POTASSIUM CHLORIDE CRYS ER 20 MEQ PO TBCR: 20.0000 meq | EXTENDED_RELEASE_TABLET | Freq: Every day | ORAL | 2 refills | Status: DC

## 2021-05-03 NOTE — Telephone Encounter (Signed)
°  Component Ref Range & Units 1 mo ago (03/18/21) 1 mo ago (03/08/21) 2 mo ago (02/05/21) 3 mo ago (01/14/21) 4 mo ago (12/29/20) 5 mo ago (11/25/20) 6 mo ago (10/28/20)  Potassium 3.5 - 5.1 mmol/L 3.7   3.2 Low   3.7  3.6  3.8  3.8

## 2021-05-07 ENCOUNTER — Ambulatory Visit (INDEPENDENT_AMBULATORY_CARE_PROVIDER_SITE_OTHER): Payer: Medicare PPO | Admitting: Family Medicine

## 2021-05-07 ENCOUNTER — Ambulatory Visit: Payer: Self-pay | Admitting: *Deleted

## 2021-05-07 ENCOUNTER — Encounter: Payer: Self-pay | Admitting: Family Medicine

## 2021-05-07 ENCOUNTER — Other Ambulatory Visit: Payer: Self-pay

## 2021-05-07 VITALS — BP 100/60 | HR 60 | Ht 64.5 in | Wt 108.0 lb

## 2021-05-07 DIAGNOSIS — J0100 Acute maxillary sinusitis, unspecified: Secondary | ICD-10-CM

## 2021-05-07 MED ORDER — AMOXICILLIN 500 MG PO CAPS: 500.0000 mg | ORAL_CAPSULE | Freq: Three times a day (TID) | ORAL | 0 refills | Status: AC

## 2021-05-07 MED ORDER — AZELASTINE HCL 0.1 % NA SOLN: 1.0000 | Freq: Two times a day (BID) | NASAL | 12 refills | Status: DC

## 2021-05-07 MED ORDER — LORATADINE 10 MG PO TABS: 10.0000 mg | ORAL_TABLET | Freq: Every day | ORAL | 11 refills | Status: DC

## 2021-05-07 NOTE — Telephone Encounter (Signed)
Reason for Disposition  [1] Sinus congestion (pressure, fullness) AND [2] present > 10 days  Answer Assessment - Initial Assessment Questions 1. LOCATION: "Where does it hurt?"      Having sinus congestion with sinus pain.   Have a very runny nose.   No coughing. 2. ONSET: "When did the sinus pain start?"  (e.g., hours, days)      Started yesterday 3. SEVERITY: "How bad is the pain?"   (Scale 1-10; mild, moderate or severe)   - MILD (1-3): doesn't interfere with normal activities    - MODERATE (4-7): interferes with normal activities (e.g., work or school) or awakens from sleep   - SEVERE (8-10): excruciating pain and patient unable to do any normal activities        *No Answer* 4. RECURRENT SYMPTOM: "Have you ever had sinus problems before?" If Yes, ask: "When was the last time?" and "What happened that time?"      *No Answer* 5. NASAL CONGESTION: "Is the nose blocked?" If Yes, ask: "Can you open it or must you breathe through your mouth?"     *No Answer* 6. NASAL DISCHARGE: "Do you have discharge from your nose?" If so ask, "What color?"     *No Answer* 7. FEVER: "Do you have a fever?" If Yes, ask: "What is it, how was it measured, and when did it start?"      *No Answer* 8. OTHER SYMPTOMS: "Do you have any other symptoms?" (e.g., sore throat, cough, earache, difficulty breathing)     *No Answer* 9. PREGNANCY: "Is there any chance you are pregnant?" "When was your last menstrual period?"     *No Answer*  Protocols used: Sinus Pain or Congestion-A-AH  Chief Complaint: sinus pain and a very runny nose Symptoms: sinus pressure, congestion Frequency: Started yesterday afternoon Pertinent Negatives: Patient denies Coughing or fever or sore throat Disposition: [] ED /[] Urgent Care (no appt availability in office) / [x] Appointment(In office/virtual)/ []  [facility]/ [] Home Care/ [] Refused Recommended Disposition /[] [facility]/ []  Follow-up with PCP Additional  Notes: Getting over colon cancer so wants to get this treated as soon as possible due to a weak immune system.   Appt made.

## 2021-05-07 NOTE — Progress Notes (Signed)
Date:  05/07/2021   Name:  Phyllis Ross   DOB:  1935/06/14   MRN:  [id number]   Chief Complaint: Sinusitis (Drainage, runny nose, sneezing and burning sensation in nose)  Sinusitis This is a new problem. The current episode started in the past 7 days. The problem is unchanged. There has been no fever. The pain is moderate. Pertinent negatives include no chills, congestion, coughing, diaphoresis, ear pain, headaches, hoarse voice, neck pain, shortness of breath, sinus pressure, sneezing, sore throat or swollen glands. Past treatments include nothing. The treatment provided mild relief.   Lab Results  Component Value Date   NA 140 03/18/2021   K 3.7 03/18/2021   CO2 25 03/18/2021   GLUCOSE 101 (H) 03/18/2021   BUN 20 03/18/2021   CREATININE 0.99 03/18/2021   CALCIUM 9.4 03/18/2021   GFRNONAA 57 (L) 03/18/2021   Lab Results  Component Value Date   CHOL 118 10/07/2020   HDL 43 10/07/2020   LDLCALC 53 10/07/2020   TRIG 109 10/07/2020   CHOLHDL 2.7 10/07/2020   No results found for: TSH Lab Results  Component Value Date   HGBA1C 5.5 04/07/2020   Lab Results  Component Value Date   WBC 8.3 03/18/2021   HGB 10.1 (L) 03/18/2021   HCT 32.3 (L) 03/18/2021   MCV 78.2 (L) 03/18/2021   PLT 325 03/18/2021   Lab Results  Component Value Date   ALT 49 (H) 03/18/2021   AST 40 03/18/2021   ALKPHOS 51 03/18/2021   BILITOT 1.7 (H) 03/18/2021   No results found for: 25OHVITD2, 25OHVITD3, VD25OH   Review of Systems  Constitutional:  Negative for chills, diaphoresis and fever.  HENT:  Negative for congestion, drooling, ear discharge, ear pain, hoarse voice, sinus pressure, sneezing and sore throat.   Respiratory:  Negative for cough, shortness of breath and wheezing.   Cardiovascular:  Negative for chest pain, palpitations and leg swelling.  Gastrointestinal:  Negative for abdominal pain, blood in stool, constipation, diarrhea and nausea.  Endocrine: Negative for polydipsia.   Genitourinary:  Negative for dysuria, frequency, hematuria and urgency.  Musculoskeletal:  Negative for back pain, myalgias and neck pain.  Skin:  Negative for rash.  Allergic/Immunologic: Negative for environmental allergies.  Neurological:  Negative for dizziness and headaches.  Hematological:  Does not bruise/bleed easily.  Psychiatric/Behavioral:  Negative for suicidal ideas. The patient is not nervous/anxious.    Patient Active Problem List   Diagnosis Date Noted   B12 deficiency 09/16/2020   Goals of care, counseling/discussion 08/07/2020   Microcytic anemia 07/23/2020   Status post partial colectomy 07/02/2020   Protein-calorie malnutrition, severe 06/24/2020   Cecal cancer ([place]) 06/15/2020   Iron deficiency anemia due to chronic blood loss    Neoplasm of lower gastrointestinal tract    Acute gastritis with hemorrhage    Personal history of other malignant neoplasm of skin 01/21/2019   Atherosclerosis of abdominal aorta ([place]) 12/26/2017   Age-related osteoporosis without current pathological fracture 06/16/2016   Encounter for follow-up surveillance of ovarian cancer 05/20/2015   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   Wedging of vertebra ([place]) 10/13/2014   Polypharmacy 10/13/2014   Encounter for general adult medical examination without abnormal findings 10/13/2014   Anxiety 10/13/2014   Essential hypertension 08/27/2014   Bradycardia 08/27/2014   Coronary artery disease of native heart with stable angina pectoris ([place]) 08/27/2014   MI (mitral incompetence) 08/27/2014   TI (tricuspid incompetence) 08/27/2014   Combined fat and carbohydrate  induced hyperlipemia 08/13/2014   HTN (hypertension), malignant 08/11/2014   Chest pain 08/11/2014   CAD (coronary artery disease) of artery bypass graft 08/11/2014    Allergies  Allergen Reactions   Trazodone And Nefazodone Other (See Comments)    Made her pass out    Past Surgical History:  Procedure  Laterality Date   COLONOSCOPY  2011   normal- Dr Vira Agar   COLONOSCOPY WITH PROPOFOL N/A 06/04/2020   Procedure: COLONOSCOPY WITH PROPOFOL;  Surgeon: Lucilla Lame, MD;  Location: [facility];  Service: Endoscopy;  Laterality: N/A;   CORONARY ARTERY BYPASS GRAFT     ESOPHAGOGASTRODUODENOSCOPY (EGD) WITH PROPOFOL N/A 06/04/2020   Procedure: ESOPHAGOGASTRODUODENOSCOPY (EGD) WITH PROPOFOL;  Surgeon: Lucilla Lame, MD;  Location: [facility];  Service: Endoscopy;  Laterality: N/A;   VAGINAL HYSTERECTOMY     DUB    Social History   Tobacco Use   Smoking status: Never   Smokeless tobacco: Never  Vaping Use   Vaping Use: Never used  Substance Use Topics   Alcohol use: No   Drug use: No     Medication list has been reviewed and updated.  Current Meds  Medication Sig   ALPRAZolam (XANAX) 0.25 MG tablet Take 1 tablet (0.25 mg total) by mouth daily as needed for anxiety.   atorvastatin (LIPITOR) 80 MG tablet TAKE ONE TABLET BY MOUTH ONCE DAILY   ferrous sulfate 325 (65 FE) MG tablet Take 325 mg by mouth daily with breakfast.   loperamide (IMODIUM) 2 MG capsule Take 2 mg by mouth as needed for diarrhea or loose stools.   megestrol (MEGACE) 40 MG tablet Take 1 tablet (40 mg total) by mouth 2 (two) times daily.   potassium chloride SA (KLOR-CON M) 20 MEQ tablet Take 1 tablet (20 mEq total) by mouth daily.   vitamin B-12 (CYANOCOBALAMIN) 1000 MCG tablet Take 1,000 mcg by mouth daily.    PHQ 2/9 Scores 04/08/2021 02/03/2021 07/20/2020 02/03/2020  PHQ - 2 Score 0 0 0 0  PHQ- 9 Score 0 - 5 -    GAD 7 : Generalized Anxiety Score 04/08/2021 07/20/2020 09/24/2019 03/21/2019  Nervous, Anxious, on Edge 0 2 0 0  Control/stop worrying 0 2 0 0  Worry too much - different things 0 2 0 0  Trouble relaxing 0 0 0 0  Restless 0 0 0 0  Easily annoyed or irritable 0 1 0 0  Afraid - awful might happen 0 1 0 0  Total GAD 7 Score 0 8 0 0  Anxiety Difficulty Not difficult at all Not difficult at all  - -    BP Readings from Last 3 Encounters:  05/07/21 100/60  04/08/21 128/76  03/19/21 (!) 149/73    Physical Exam Vitals and nursing note reviewed.  Constitutional:      Appearance: She is well-developed.  HENT:     Head: Normocephalic.     Right Ear: Tympanic membrane and external ear normal.     Left Ear: Tympanic membrane and external ear normal.     Nose: Congestion and rhinorrhea present.     Right Turbinates: Swollen.     Left Turbinates: Swollen.     Right Sinus: Maxillary sinus tenderness present. No frontal sinus tenderness.     Left Sinus: Maxillary sinus tenderness present. No frontal sinus tenderness.     Mouth/Throat:     Pharynx: Oropharynx is clear. Uvula midline.  Eyes:     General: Lids are everted, no foreign bodies appreciated.  No scleral icterus.       Left eye: No foreign body or hordeolum.     Conjunctiva/sclera: Conjunctivae normal.     Right eye: Right conjunctiva is not injected.     Left eye: Left conjunctiva is not injected.     Pupils: Pupils are equal, round, and reactive to light.  Neck:     Thyroid: No thyromegaly.     Vascular: No carotid bruit or JVD.     Trachea: No tracheal deviation.  Cardiovascular:     Rate and Rhythm: Normal rate and regular rhythm.     Heart sounds: Normal heart sounds. No murmur heard.   No friction rub. No gallop.  Pulmonary:     Effort: Pulmonary effort is normal. No respiratory distress.     Breath sounds: Normal breath sounds. No stridor. No wheezing, rhonchi or rales.  Chest:     Chest wall: No tenderness.  Abdominal:     General: Bowel sounds are normal.     Palpations: Abdomen is soft. There is no mass.     Tenderness: There is no abdominal tenderness. There is no guarding or rebound.  Musculoskeletal:        General: No tenderness. Normal range of motion.     Cervical back: Normal range of motion and neck supple.  Lymphadenopathy:     Cervical: No cervical adenopathy.  Skin:    General: Skin is  warm.     Findings: No erythema or rash.  Neurological:     Mental Status: She is alert and oriented to person, place, and time.     Cranial Nerves: No cranial nerve deficit.     Deep Tendon Reflexes: Reflexes normal.  Psychiatric:        Mood and Affect: Mood is not anxious or depressed.    Wt Readings from Last 3 Encounters:  05/07/21 108 lb (49 kg)  04/08/21 110 lb (49.9 kg)  03/19/21 107 lb 12.8 oz (48.9 kg)    BP 100/60    Pulse 60    Ht 5' 4.5" (1.638 m)    Wt 108 lb (49 kg)    BMI 18.25 kg/m   Assessment and Plan:  1. Acute maxillary sinusitis, recurrence not specified Acute.  Persistent.  Stable.  Patient has had upper respiratory congestion and rhinorrhea for several days.  We will count on with Astelin nasal spray 1 spray in each nostril twice a day and loratadine 10 mg once a day and amoxicillin 500 mg 1 3 times a day. - azelastine (ASTELIN) 0.1 % nasal spray; Place 1 spray into both nostrils 2 (two) times daily. Use in each nostril as directed  Dispense: 30 mL; Refill: 12 - loratadine (CLARITIN) 10 MG tablet; Take 1 tablet (10 mg total) by mouth daily.  Dispense: 30 tablet; Refill: 11 - amoxicillin (AMOXIL) 500 MG capsule; Take 1 capsule (500 mg total) by mouth 3 (three) times daily for 10 days.  Dispense: 30 capsule; Refill: 0

## 2021-05-10 ENCOUNTER — Ambulatory Visit (INDEPENDENT_AMBULATORY_CARE_PROVIDER_SITE_OTHER): Payer: Medicare PPO

## 2021-05-10 ENCOUNTER — Other Ambulatory Visit: Payer: Self-pay

## 2021-05-10 ENCOUNTER — Ambulatory Visit: Payer: Medicare PPO

## 2021-05-10 DIAGNOSIS — E538 Deficiency of other specified B group vitamins: Secondary | ICD-10-CM | POA: Diagnosis not present

## 2021-05-10 MED ORDER — CYANOCOBALAMIN 1000 MCG/ML IJ SOLN
1000.0000 ug | Freq: Once | INTRAMUSCULAR | Status: AC
  Administered 2021-05-10: 1000 ug via INTRAMUSCULAR

## 2021-05-14 ENCOUNTER — Other Ambulatory Visit: Payer: Self-pay

## 2021-05-14 ENCOUNTER — Telehealth: Payer: Self-pay

## 2021-05-14 DIAGNOSIS — T3695XA Adverse effect of unspecified systemic antibiotic, initial encounter: Secondary | ICD-10-CM

## 2021-05-14 DIAGNOSIS — B379 Candidiasis, unspecified: Secondary | ICD-10-CM

## 2021-05-14 MED ORDER — FLUCONAZOLE 150 MG PO TABS: 150.0000 mg | ORAL_TABLET | Freq: Once | ORAL | 0 refills | Status: AC

## 2021-05-14 NOTE — Telephone Encounter (Signed)
Copied from [place] [id number]. Topic: General - Other >> May 14, 2021  1:24 PM Pawlus, Brayton Layman A wrote: Reason for CRM: Pt called in requesting to speak directly with Baxter Flattery to go over some medications, please advise.

## 2021-05-14 NOTE — Telephone Encounter (Signed)
Pt has yeast infection from antibiotic- sent in diflucan to [facility]

## 2021-05-17 ENCOUNTER — Encounter: Payer: Self-pay | Admitting: Family Medicine

## 2021-05-17 ENCOUNTER — Other Ambulatory Visit: Payer: Self-pay

## 2021-05-17 ENCOUNTER — Ambulatory Visit (INDEPENDENT_AMBULATORY_CARE_PROVIDER_SITE_OTHER): Payer: Medicare PPO | Admitting: Family Medicine

## 2021-05-17 VITALS — BP 132/78 | HR 76 | Temp 98.3°F | Ht 64.5 in | Wt 109.0 lb

## 2021-05-17 DIAGNOSIS — K140 Glossitis: Secondary | ICD-10-CM | POA: Diagnosis not present

## 2021-05-17 LAB — POCT RAPID STREP A (OFFICE): Rapid Strep A Screen: NEGATIVE

## 2021-05-17 MED ORDER — NYSTATIN 100000 UNIT/ML MT SUSP: 5.0000 mL | Freq: Four times a day (QID) | OROMUCOSAL | 0 refills | Status: DC

## 2021-05-17 NOTE — Addendum Note (Signed)
Addended by: Fredderick Severance on: 05/17/2021 02:26 PM   Modules accepted: Orders

## 2021-05-17 NOTE — Patient Instructions (Signed)

## 2021-05-17 NOTE — Progress Notes (Signed)
Date:  05/17/2021   Name:  Phyllis Ross   DOB:  [birth year]   MRN:  [id number]   Chief Complaint: Sore Throat (Finishes Amoxicillin today- sore throat started yesterday. Using nasal spray and loratadine)  Sore Throat  This is a new problem. The current episode started in the past 7 days (Friday). The problem has been waxing and waning. There has been no fever. The pain is at a severity of 5/10. The pain is moderate. Associated symptoms include a hoarse voice and trouble swallowing. Pertinent negatives include no abdominal pain, congestion, coughing, diarrhea, ear discharge, ear pain, headaches, plugged ear sensation, neck pain, shortness of breath, stridor, swollen glands or vomiting. Associated symptoms comments: Pain with swallowing. She has had no exposure to strep or mono. Treatments tried: amoxil. The treatment provided no relief.   Lab Results  Component Value Date   NA 140 03/18/2021   K 3.7 03/18/2021   CO2 25 03/18/2021   GLUCOSE 101 (H) 03/18/2021   BUN 20 03/18/2021   CREATININE 0.99 03/18/2021   CALCIUM 9.4 03/18/2021   GFRNONAA 57 (L) 03/18/2021   Lab Results  Component Value Date   CHOL 118 10/07/2020   HDL 43 10/07/2020   LDLCALC 53 10/07/2020   TRIG 109 10/07/2020   CHOLHDL 2.7 10/07/2020   No results found for: TSH Lab Results  Component Value Date   HGBA1C 5.5 04/07/2020   Lab Results  Component Value Date   WBC 8.3 03/18/2021   HGB 10.1 (L) 03/18/2021   HCT 32.3 (L) 03/18/2021   MCV 78.2 (L) 03/18/2021   PLT 325 03/18/2021   Lab Results  Component Value Date   ALT 49 (H) 03/18/2021   AST 40 03/18/2021   ALKPHOS 51 03/18/2021   BILITOT 1.7 (H) 03/18/2021   No results found for: 25OHVITD2, 25OHVITD3, VD25OH   Review of Systems  Constitutional: Negative.  Negative for chills, fatigue, fever and unexpected weight change.  HENT:  Positive for hoarse voice and trouble swallowing. Negative for congestion, ear discharge, ear pain, rhinorrhea,  sinus pressure, sneezing and sore throat.   Eyes:  Negative for photophobia, pain, discharge, redness and itching.  Respiratory:  Negative for cough, shortness of breath, wheezing and stridor.   Gastrointestinal:  Negative for abdominal pain, blood in stool, constipation, diarrhea, nausea and vomiting.  Endocrine: Negative for cold intolerance, heat intolerance, polydipsia, polyphagia and polyuria.  Genitourinary:  Negative for dysuria, flank pain, frequency, hematuria, menstrual problem, pelvic pain, urgency, vaginal bleeding and vaginal discharge.  Musculoskeletal:  Negative for arthralgias, back pain, myalgias and neck pain.  Skin:  Negative for rash.  Allergic/Immunologic: Negative for environmental allergies and food allergies.  Neurological:  Negative for dizziness, weakness, light-headedness, numbness and headaches.  Hematological:  Negative for adenopathy. Does not bruise/bleed easily.  Psychiatric/Behavioral:  Negative for dysphoric mood. The patient is not nervous/anxious.    Patient Active Problem List   Diagnosis Date Noted   B12 deficiency 09/16/2020   Goals of care, counseling/discussion 08/07/2020   Microcytic anemia 07/23/2020   Status post partial colectomy 07/02/2020   Protein-calorie malnutrition, severe 06/24/2020   Cecal cancer ([place]) 06/15/2020   Iron deficiency anemia due to chronic blood loss    Neoplasm of lower gastrointestinal tract    Acute gastritis with hemorrhage    Personal history of other malignant neoplasm of skin 01/21/2019   Atherosclerosis of abdominal aorta ([place]) 12/26/2017   Age-related osteoporosis without current pathological fracture 06/16/2016   Encounter for follow-up  surveillance of ovarian cancer 05/20/2015   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   Wedging of vertebra ([place]) 10/13/2014   Polypharmacy 10/13/2014   Encounter for general adult medical examination without abnormal findings 10/13/2014   Anxiety 10/13/2014    Essential hypertension 08/27/2014   Bradycardia 08/27/2014   Coronary artery disease of native heart with stable angina pectoris ([place]) 08/27/2014   MI (mitral incompetence) 08/27/2014   TI (tricuspid incompetence) 08/27/2014   Combined fat and carbohydrate induced hyperlipemia 08/13/2014   HTN (hypertension), malignant 08/11/2014   Chest pain 08/11/2014   CAD (coronary artery disease) of artery bypass graft 08/11/2014    Allergies  Allergen Reactions   Trazodone And Nefazodone Other (See Comments)    Made her pass out    Past Surgical History:  Procedure Laterality Date   COLONOSCOPY  2011   normal- Dr Vira Agar   COLONOSCOPY WITH PROPOFOL N/A 06/04/2020   Procedure: COLONOSCOPY WITH PROPOFOL;  Surgeon: Lucilla Lame, MD;  Location: [facility];  Service: Endoscopy;  Laterality: N/A;   CORONARY ARTERY BYPASS GRAFT     ESOPHAGOGASTRODUODENOSCOPY (EGD) WITH PROPOFOL N/A 06/04/2020   Procedure: ESOPHAGOGASTRODUODENOSCOPY (EGD) WITH PROPOFOL;  Surgeon: Lucilla Lame, MD;  Location: [facility];  Service: Endoscopy;  Laterality: N/A;   VAGINAL HYSTERECTOMY     DUB    Social History   Tobacco Use   Smoking status: Never   Smokeless tobacco: Never  Vaping Use   Vaping Use: Never used  Substance Use Topics   Alcohol use: No   Drug use: No     Medication list has been reviewed and updated.  Current Meds  Medication Sig   ALPRAZolam (XANAX) 0.25 MG tablet Take 1 tablet (0.25 mg total) by mouth daily as needed for anxiety.   amoxicillin (AMOXIL) 500 MG capsule Take 1 capsule (500 mg total) by mouth 3 (three) times daily for 10 days.   atorvastatin (LIPITOR) 80 MG tablet TAKE ONE TABLET BY MOUTH ONCE DAILY   azelastine (ASTELIN) 0.1 % nasal spray Place 1 spray into both nostrils 2 (two) times daily. Use in each nostril as directed   ferrous sulfate 325 (65 FE) MG tablet Take 325 mg by mouth daily with breakfast.   loperamide (IMODIUM) 2 MG capsule Take 2 mg by mouth  as needed for diarrhea or loose stools.   loratadine (CLARITIN) 10 MG tablet Take 1 tablet (10 mg total) by mouth daily.   megestrol (MEGACE) 40 MG tablet Take 1 tablet (40 mg total) by mouth 2 (two) times daily.   potassium chloride SA (KLOR-CON M) 20 MEQ tablet Take 1 tablet (20 mEq total) by mouth daily.   vitamin B-12 (CYANOCOBALAMIN) 1000 MCG tablet Take 1,000 mcg by mouth daily.    PHQ 2/9 Scores 04/08/2021 02/03/2021 07/20/2020 02/03/2020  PHQ - 2 Score 0 0 0 0  PHQ- 9 Score 0 - 5 -    GAD 7 : Generalized Anxiety Score 04/08/2021 07/20/2020 09/24/2019 03/21/2019  Nervous, Anxious, on Edge 0 2 0 0  Control/stop worrying 0 2 0 0  Worry too much - different things 0 2 0 0  Trouble relaxing 0 0 0 0  Restless 0 0 0 0  Easily annoyed or irritable 0 1 0 0  Afraid - awful might happen 0 1 0 0  Total GAD 7 Score 0 8 0 0  Anxiety Difficulty Not difficult at all Not difficult at all - -    BP Readings from Last 3 Encounters:  05/17/21 132/78  05/07/21 100/60  04/08/21 128/76    Physical Exam Vitals and nursing note reviewed.  Constitutional:      Appearance: She is well-developed.  HENT:     Head: Normocephalic.     Right Ear: Tympanic membrane, ear canal and external ear normal.     Left Ear: Tympanic membrane, ear canal and external ear normal.     Mouth/Throat:     Mouth: No oral lesions.     Pharynx: Posterior oropharyngeal erythema present. No pharyngeal swelling, oropharyngeal exudate or uvula swelling.  Eyes:     General: Lids are everted, no foreign bodies appreciated. No scleral icterus.       Left eye: No foreign body or hordeolum.     Conjunctiva/sclera: Conjunctivae normal.     Right eye: Right conjunctiva is not injected.     Left eye: Left conjunctiva is not injected.     Pupils: Pupils are equal, round, and reactive to light.  Neck:     Thyroid: No thyromegaly.     Vascular: No JVD.     Trachea: No tracheal deviation.  Cardiovascular:     Rate and Rhythm: Normal  rate and regular rhythm.     Heart sounds: Normal heart sounds. No murmur heard.   No friction rub. No gallop.  Pulmonary:     Effort: Pulmonary effort is normal. No respiratory distress.     Breath sounds: Normal breath sounds. No wheezing or rales.  Abdominal:     General: Bowel sounds are normal.     Palpations: Abdomen is soft. There is no mass.     Tenderness: There is no abdominal tenderness. There is no guarding or rebound.  Musculoskeletal:        General: No tenderness. Normal range of motion.     Cervical back: Normal range of motion and neck supple.  Lymphadenopathy:     Cervical: No cervical adenopathy.  Skin:    General: Skin is warm.     Findings: No rash.  Neurological:     Mental Status: She is alert and oriented to person, place, and time.     Cranial Nerves: No cranial nerve deficit.     Deep Tendon Reflexes: Reflexes normal.  Psychiatric:        Mood and Affect: Mood is not anxious or depressed.    Wt Readings from Last 3 Encounters:  05/17/21 109 lb (49.4 kg)  05/07/21 108 lb (49 kg)  04/08/21 110 lb (49.9 kg)    BP 132/78    Pulse 76    Temp 98.3 F (36.8 C) (Oral)    Ht 5' 4.5" (1.638 m)    Wt 109 lb (49.4 kg)    SpO2 99%    BMI 18.42 kg/m   Assessment and Plan:  1. Glossitis New onset.  Presently persistent.  Relatively stable but unresolving.  Patient is in the latter stages of antibiotic course of amoxicillin and has been instructed to discontinue at this time as underlying infection has been resolved.  Strep is negative and the oral exam is  with erythema of the tongue and oral cavity consistent with oral yeast infection/candidiasis.  We will treat with nystatin suspension teaspoon swish and swallow 3-4 times a day. - nystatin (MYCOSTATIN) 100000 UNIT/ML suspension; Take 5 mLs (500,000 Units total) by mouth 4 (four) times daily.  Dispense: 60 mL; Refill: 0

## 2021-05-20 ENCOUNTER — Other Ambulatory Visit: Payer: Self-pay

## 2021-05-20 ENCOUNTER — Ambulatory Visit (INDEPENDENT_AMBULATORY_CARE_PROVIDER_SITE_OTHER): Payer: Medicare PPO | Admitting: Family Medicine

## 2021-05-20 ENCOUNTER — Encounter: Payer: Self-pay | Admitting: Family Medicine

## 2021-05-20 VITALS — BP 120/80 | HR 60 | Ht 64.5 in | Wt 109.0 lb

## 2021-05-20 DIAGNOSIS — M436 Torticollis: Secondary | ICD-10-CM | POA: Diagnosis not present

## 2021-05-20 NOTE — Patient Instructions (Signed)
Acute Torticollis, Adult Torticollis is a condition in which the muscles of the neck tighten (contract) abnormally, causing the neck to twist and the head to move into an unnatural position. Torticollis that develops suddenly is called acute torticollis. People with acute torticollis may have trouble turning their head. The condition can be painful and may range from mild to severe. What are the causes? This condition may be caused by: Sleeping in an awkward position. This is common. Extending or twisting the neck muscles beyond their normal position. An injury to the neck muscles. An infection. A tumor. Certain medicines. Long-lasting spasms of the neck muscles. In some cases, the cause may not be known. What increases the risk? You are more likely to develop this condition if: You have a condition associated with loose ligaments, such as Down syndrome. You have a brain condition that affects vision, such as strabismus. What are the signs or symptoms? The main symptom of this condition is tilting of the head to one side. Other symptoms include: Pain in the neck. Trouble turning the head from side to side or up and down. How is this diagnosed? This condition may be diagnosed based on: A physical exam. Your medical history. Imaging tests, such as: An X-ray. An ultrasound. A CT scan. An MRI. How is this treated? Treatment for this condition depends on what is causing the condition. Mild cases may go away without treatment. Treatment for more serious cases may include: Medicines or shots to relax the muscles. Other medicines, such as antibiotics, to treat the underlying cause. Wearing a soft neck collar. Physical therapy and stretching exercises to improve movement and strength in your neck. Neck massage. In severe cases, surgery may be needed to repair dislocated or broken bones or to treat nerves in the neck. Follow these instructions at home:  Take over-the-counter and  prescription medicines only as told by your health care provider. Do stretching exercises and massage your neck as told by your health care provider. If directed, apply heat to the affected area as often as told by your health care provider. Use the heat source that your health care provider recommends, such as a moist heat pack or a heating pad. Place a towel between your skin and the heat source. Leave the heat on for 20-30 minutes. Remove the heat if your skin turns bright red. This is especially important if you are unable to feel pain, heat, or cold. You have a greater risk of getting burned. If you wake up with torticollis after sleeping, check your bed or sleeping area. Look for lumpy pillows or unusual objects. Make sure your bed and sleeping area are comfortable. Keep all follow-up visits. This is important. Contact a health care provider if: You have a fever. Your symptoms do not improve or they get worse. Get help right away if: You have trouble breathing. You make loud, high-pitched sounds when you breathe, most often when you breathe in (stridor). You start to drool. You have trouble swallowing or pain when swallowing. You develop numbness or weakness in your hands or feet. You have changes in your speech, understanding, or vision. You are in severe pain. You cannot move your head or neck. These symptoms may represent a serious problem that is an emergency. Do not wait to see if the symptoms will go away. Get medical help right away. Call your local emergency services (911 in the [place] Do not drive yourself to the hospital. Summary Torticollis is a condition in which the  muscles of the neck tighten (contract) abnormally, causing the neck to twist and the head to move into an unnatural position. Torticollis that develops suddenly is called acute torticollis. Treatment for this condition depends on what is causing the condition. Mild cases may go away without treatment. Do  stretching exercises and massage your neck as told by your health care provider. You may also be instructed to apply heat to the area. Contact your health care provider if your symptoms do not improve or they get worse. This information is not intended to replace advice given to you by your health care provider. Make sure you discuss any questions you have with your health care provider. Document Revised: 07/19/2019 Document Reviewed: 07/19/2019 Elsevier Patient Education  [employer].

## 2021-05-20 NOTE — Progress Notes (Signed)
Date:  05/20/2021   Name:  Phyllis Ross   DOB:  1935-09-20   MRN:  [id number]   Chief Complaint: Ear Pain (R) ear pain and having pains in head on R) side)  Otalgia  There is pain in the right ("hell of a night with the ear") ear. This is a new problem. The current episode started in the past 7 days (about a week). The problem occurs hourly. The problem has been waxing and waning. There has been no fever. The pain is at a severity of 3/10. The pain is moderate. Pertinent negatives include no hearing loss or rash. She has tried acetaminophen for the symptoms.   Lab Results  Component Value Date   NA 140 03/18/2021   K 3.7 03/18/2021   CO2 25 03/18/2021   GLUCOSE 101 (H) 03/18/2021   BUN 20 03/18/2021   CREATININE 0.99 03/18/2021   CALCIUM 9.4 03/18/2021   GFRNONAA 57 (L) 03/18/2021   Lab Results  Component Value Date   CHOL 118 10/07/2020   HDL 43 10/07/2020   LDLCALC 53 10/07/2020   TRIG 109 10/07/2020   CHOLHDL 2.7 10/07/2020   No results found for: TSH Lab Results  Component Value Date   HGBA1C 5.5 04/07/2020   Lab Results  Component Value Date   WBC 8.3 03/18/2021   HGB 10.1 (L) 03/18/2021   HCT 32.3 (L) 03/18/2021   MCV 78.2 (L) 03/18/2021   PLT 325 03/18/2021   Lab Results  Component Value Date   ALT 49 (H) 03/18/2021   AST 40 03/18/2021   ALKPHOS 51 03/18/2021   BILITOT 1.7 (H) 03/18/2021   No results found for: 25OHVITD2, 25OHVITD3, VD25OH   Review of Systems  HENT:  Positive for ear pain. Negative for hearing loss.   Eyes:  Negative for photophobia and visual disturbance.  Cardiovascular:  Negative for chest pain.  Skin:  Negative for rash.   Patient Active Problem List   Diagnosis Date Noted   B12 deficiency 09/16/2020   Goals of care, counseling/discussion 08/07/2020   Microcytic anemia 07/23/2020   Status post partial colectomy 07/02/2020   Protein-calorie malnutrition, severe 06/24/2020   Cecal cancer ([place]) 06/15/2020   Iron  deficiency anemia due to chronic blood loss    Neoplasm of lower gastrointestinal tract    Acute gastritis with hemorrhage    Personal history of other malignant neoplasm of skin 01/21/2019   Atherosclerosis of abdominal aorta ([place]) 12/26/2017   Age-related osteoporosis without current pathological fracture 06/16/2016   Encounter for follow-up surveillance of ovarian cancer 05/20/2015   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   Wedging of vertebra ([place]) 10/13/2014   Polypharmacy 10/13/2014   Encounter for general adult medical examination without abnormal findings 10/13/2014   Anxiety 10/13/2014   Essential hypertension 08/27/2014   Bradycardia 08/27/2014   Coronary artery disease of native heart with stable angina pectoris ([place]) 08/27/2014   MI (mitral incompetence) 08/27/2014   TI (tricuspid incompetence) 08/27/2014   Combined fat and carbohydrate induced hyperlipemia 08/13/2014   HTN (hypertension), malignant 08/11/2014   Chest pain 08/11/2014   CAD (coronary artery disease) of artery bypass graft 08/11/2014    Allergies  Allergen Reactions   Trazodone And Nefazodone Other (See Comments)    Made her pass out    Past Surgical History:  Procedure Laterality Date   COLONOSCOPY  2011   normal- Dr Vira Agar   COLONOSCOPY WITH PROPOFOL N/A 06/04/2020   Procedure: COLONOSCOPY WITH PROPOFOL;  Surgeon:  Lucilla Lame, MD;  Location: [facility];  Service: Endoscopy;  Laterality: N/A;   CORONARY ARTERY BYPASS GRAFT     ESOPHAGOGASTRODUODENOSCOPY (EGD) WITH PROPOFOL N/A 06/04/2020   Procedure: ESOPHAGOGASTRODUODENOSCOPY (EGD) WITH PROPOFOL;  Surgeon: Lucilla Lame, MD;  Location: [facility];  Service: Endoscopy;  Laterality: N/A;   VAGINAL HYSTERECTOMY     DUB    Social History   Tobacco Use   Smoking status: Never   Smokeless tobacco: Never  Vaping Use   Vaping Use: Never used  Substance Use Topics   Alcohol use: No   Drug use: No      Medication list has been reviewed and updated.  Current Meds  Medication Sig   ALPRAZolam (XANAX) 0.25 MG tablet Take 1 tablet (0.25 mg total) by mouth daily as needed for anxiety.   atorvastatin (LIPITOR) 80 MG tablet TAKE ONE TABLET BY MOUTH ONCE DAILY   azelastine (ASTELIN) 0.1 % nasal spray Place 1 spray into both nostrils 2 (two) times daily. Use in each nostril as directed   ferrous sulfate 325 (65 FE) MG tablet Take 325 mg by mouth daily with breakfast.   loperamide (IMODIUM) 2 MG capsule Take 2 mg by mouth as needed for diarrhea or loose stools.   loratadine (CLARITIN) 10 MG tablet Take 1 tablet (10 mg total) by mouth daily.   megestrol (MEGACE) 40 MG tablet Take 1 tablet (40 mg total) by mouth 2 (two) times daily.   nystatin (MYCOSTATIN) 100000 UNIT/ML suspension Take 5 mLs (500,000 Units total) by mouth 4 (four) times daily.   potassium chloride SA (KLOR-CON M) 20 MEQ tablet Take 1 tablet (20 mEq total) by mouth daily.   vitamin B-12 (CYANOCOBALAMIN) 1000 MCG tablet Take 1,000 mcg by mouth daily.    PHQ 2/9 Scores 04/08/2021 02/03/2021 07/20/2020 02/03/2020  PHQ - 2 Score 0 0 0 0  PHQ- 9 Score 0 - 5 -    GAD 7 : Generalized Anxiety Score 04/08/2021 07/20/2020 09/24/2019 03/21/2019  Nervous, Anxious, on Edge 0 2 0 0  Control/stop worrying 0 2 0 0  Worry too much - different things 0 2 0 0  Trouble relaxing 0 0 0 0  Restless 0 0 0 0  Easily annoyed or irritable 0 1 0 0  Afraid - awful might happen 0 1 0 0  Total GAD 7 Score 0 8 0 0  Anxiety Difficulty Not difficult at all Not difficult at all - -    BP Readings from Last 3 Encounters:  05/20/21 120/80  05/17/21 132/78  05/07/21 100/60    Physical Exam HENT:     Head: Normocephalic.     Right Ear: Tympanic membrane, ear canal and external ear normal. There is no impacted cerumen.     Nose: Nose normal.  Neck:     Comments: Tender along right sternocleidomastoid Cardiovascular:     Heart sounds: No murmur heard.    No gallop.  Pulmonary:     Breath sounds: No wheezing or rhonchi.  Musculoskeletal:     Cervical back: Normal range of motion and neck supple. Tenderness present. No rigidity.  Lymphadenopathy:     Cervical: No cervical adenopathy.  Neurological:     Mental Status: She is alert.     Cranial Nerves: Cranial nerves 2-12 are intact.     Sensory: Sensation is intact.     Motor: Motor function is intact.    Wt Readings from Last 3 Encounters:  05/20/21 109 lb (49.4  kg)  05/17/21 109 lb (49.4 kg)  05/07/21 108 lb (49 kg)    BP 120/80    Pulse 60    Ht 5' 4.5" (1.638 m)    Wt 109 lb (49.4 kg)    BMI 18.42 kg/m   Assessment and Plan:  1. Acute torticollis Acute.  Episodic.  Relatively stable.  On examination tenderness is noted along the sternocleidomastoid and trapezius.  There is no palpable mass no adenopathy tympanic membrane is normal.  Suggested Advil or Aleve on a baseline daily for 1 to 2 weeks with episodic Tylenol 3 breakthrough.  Of also suggested that she has some pillow or other support of the head so that she is not twisting and turning during the night.

## 2021-05-27 ENCOUNTER — Other Ambulatory Visit: Payer: Self-pay

## 2021-05-27 ENCOUNTER — Encounter: Payer: Self-pay | Admitting: Family Medicine

## 2021-05-27 ENCOUNTER — Ambulatory Visit (INDEPENDENT_AMBULATORY_CARE_PROVIDER_SITE_OTHER): Payer: Medicare PPO | Admitting: Family Medicine

## 2021-05-27 VITALS — BP 110/60 | HR 100 | Temp 97.4°F | Ht 64.5 in | Wt 105.0 lb

## 2021-05-27 DIAGNOSIS — J45901 Unspecified asthma with (acute) exacerbation: Secondary | ICD-10-CM

## 2021-05-27 DIAGNOSIS — J849 Interstitial pulmonary disease, unspecified: Secondary | ICD-10-CM

## 2021-05-27 MED ORDER — BENZONATATE 100 MG PO CAPS
100.0000 mg | ORAL_CAPSULE | Freq: Three times a day (TID) | ORAL | 2 refills | Status: DC | PRN
Start: 2021-05-27 — End: 2021-09-30

## 2021-05-27 MED ORDER — MONTELUKAST SODIUM 10 MG PO TABS: 10.0000 mg | ORAL_TABLET | Freq: Every day | ORAL | 3 refills | Status: DC

## 2021-05-27 NOTE — Progress Notes (Signed)
Date:  05/27/2021   Name:  Phyllis Ross   DOB:  December 24, 1935   MRN:  [id number]   Chief Complaint: Cough  Cough This is a new problem. The current episode started in the past 7 days (last 2 days). The problem has been waxing and waning. The cough is Non-productive. Pertinent negatives include no chills, fever, nasal congestion, postnasal drip, rhinorrhea, sore throat or shortness of breath. The symptoms are aggravated by pollens. She has tried nothing for the symptoms.   Lab Results  Component Value Date   NA 140 03/18/2021   K 3.7 03/18/2021   CO2 25 03/18/2021   GLUCOSE 101 (H) 03/18/2021   BUN 20 03/18/2021   CREATININE 0.99 03/18/2021   CALCIUM 9.4 03/18/2021   GFRNONAA 57 (L) 03/18/2021   Lab Results  Component Value Date   CHOL 118 10/07/2020   HDL 43 10/07/2020   LDLCALC 53 10/07/2020   TRIG 109 10/07/2020   CHOLHDL 2.7 10/07/2020   No results found for: TSH Lab Results  Component Value Date   HGBA1C 5.5 04/07/2020   Lab Results  Component Value Date   WBC 8.3 03/18/2021   HGB 10.1 (L) 03/18/2021   HCT 32.3 (L) 03/18/2021   MCV 78.2 (L) 03/18/2021   PLT 325 03/18/2021   Lab Results  Component Value Date   ALT 49 (H) 03/18/2021   AST 40 03/18/2021   ALKPHOS 51 03/18/2021   BILITOT 1.7 (H) 03/18/2021   No results found for: 25OHVITD2, 25OHVITD3, VD25OH   Review of Systems  Constitutional:  Negative for chills and fever.  HENT:  Negative for postnasal drip, rhinorrhea and sore throat.   Respiratory:  Positive for cough. Negative for shortness of breath.    Patient Active Problem List   Diagnosis Date Noted   B12 deficiency 09/16/2020   Goals of care, counseling/discussion 08/07/2020   Microcytic anemia 07/23/2020   Status post partial colectomy 07/02/2020   Protein-calorie malnutrition, severe 06/24/2020   Cecal cancer ([place]) 06/15/2020   Iron deficiency anemia due to chronic blood loss    Neoplasm of lower gastrointestinal tract    Acute  gastritis with hemorrhage    Personal history of other malignant neoplasm of skin 01/21/2019   Atherosclerosis of abdominal aorta ([place]) 12/26/2017   Age-related osteoporosis without current pathological fracture 06/16/2016   Encounter for follow-up surveillance of ovarian cancer 05/20/2015   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   Wedging of vertebra ([place]) 10/13/2014   Polypharmacy 10/13/2014   Encounter for general adult medical examination without abnormal findings 10/13/2014   Anxiety 10/13/2014   Essential hypertension 08/27/2014   Bradycardia 08/27/2014   Coronary artery disease of native heart with stable angina pectoris ([place]) 08/27/2014   MI (mitral incompetence) 08/27/2014   TI (tricuspid incompetence) 08/27/2014   Combined fat and carbohydrate induced hyperlipemia 08/13/2014   HTN (hypertension), malignant 08/11/2014   Chest pain 08/11/2014   CAD (coronary artery disease) of artery bypass graft 08/11/2014    Allergies  Allergen Reactions   Trazodone And Nefazodone Other (See Comments)    Made her pass out    Past Surgical History:  Procedure Laterality Date   COLONOSCOPY  2011   normal- Dr Vira Agar   COLONOSCOPY WITH PROPOFOL N/A 06/04/2020   Procedure: COLONOSCOPY WITH PROPOFOL;  Surgeon: Lucilla Lame, MD;  Location: [facility];  Service: Endoscopy;  Laterality: N/A;   CORONARY ARTERY BYPASS GRAFT     ESOPHAGOGASTRODUODENOSCOPY (EGD) WITH PROPOFOL N/A 06/04/2020  Procedure: ESOPHAGOGASTRODUODENOSCOPY (EGD) WITH PROPOFOL;  Surgeon: Lucilla Lame, MD;  Location: [facility];  Service: Endoscopy;  Laterality: N/A;   VAGINAL HYSTERECTOMY     DUB    Social History   Tobacco Use   Smoking status: Never   Smokeless tobacco: Never  Vaping Use   Vaping Use: Never used  Substance Use Topics   Alcohol use: No   Drug use: No     Medication list has been reviewed and updated.  Current Meds  Medication Sig   ALPRAZolam (XANAX)  0.25 MG tablet Take 1 tablet (0.25 mg total) by mouth daily as needed for anxiety.   atorvastatin (LIPITOR) 80 MG tablet TAKE ONE TABLET BY MOUTH ONCE DAILY   azelastine (ASTELIN) 0.1 % nasal spray Place 1 spray into both nostrils 2 (two) times daily. Use in each nostril as directed   ferrous sulfate 325 (65 FE) MG tablet Take 325 mg by mouth daily with breakfast.   loperamide (IMODIUM) 2 MG capsule Take 2 mg by mouth as needed for diarrhea or loose stools.   loratadine (CLARITIN) 10 MG tablet Take 1 tablet (10 mg total) by mouth daily.   megestrol (MEGACE) 40 MG tablet Take 1 tablet (40 mg total) by mouth 2 (two) times daily.   nystatin (MYCOSTATIN) 100000 UNIT/ML suspension Take 5 mLs (500,000 Units total) by mouth 4 (four) times daily.   potassium chloride SA (KLOR-CON M) 20 MEQ tablet Take 1 tablet (20 mEq total) by mouth daily.   vitamin B-12 (CYANOCOBALAMIN) 1000 MCG tablet Take 1,000 mcg by mouth daily.    PHQ 2/9 Scores 04/08/2021 02/03/2021 07/20/2020 02/03/2020  PHQ - 2 Score 0 0 0 0  PHQ- 9 Score 0 - 5 -    GAD 7 : Generalized Anxiety Score 04/08/2021 07/20/2020 09/24/2019 03/21/2019  Nervous, Anxious, on Edge 0 2 0 0  Control/stop worrying 0 2 0 0  Worry too much - different things 0 2 0 0  Trouble relaxing 0 0 0 0  Restless 0 0 0 0  Easily annoyed or irritable 0 1 0 0  Afraid - awful might happen 0 1 0 0  Total GAD 7 Score 0 8 0 0  Anxiety Difficulty Not difficult at all Not difficult at all - -    BP Readings from Last 3 Encounters:  05/27/21 110/60  05/20/21 120/80  05/17/21 132/78    Physical Exam  Wt Readings from Last 3 Encounters:  05/27/21 105 lb (47.6 kg)  05/20/21 109 lb (49.4 kg)  05/17/21 109 lb (49.4 kg)    BP 110/60    Pulse 100    Temp (!) 97.4 F (36.3 C) (Oral)    Ht 5' 4.5" (1.638 m)    Wt 105 lb (47.6 kg)    BMI 17.74 kg/m   Assessment and Plan:  1. Bronchitis, allergic, unspecified asthma severity, with acute exacerbation New exacerbation of  underlying lung lung disease.  Review of last CT notes some interstitial changes but for the most part unremarkable.  There is no productive cough no hemoptysis no fever no chills as suspect there is no pneumonia.  Lung exam was unremarkable on percussion and auscultation.  Given the pollen concerns which is in the moderate to high range today and when conditions we will treat with Singulair 10 mg once a day.  And prescribe Tessalon Perles to be used on a as needed basis.  Upon evaluation there were findings that were noted that could be consistent  with interstitial disease.  But there is no increasing shortness of breath and I rather doubt that this is even of a mild variety - montelukast (SINGULAIR) 10 MG tablet; Take 1 tablet (10 mg total) by mouth at bedtime.  Dispense: 30 tablet; Refill: 3 - benzonatate (TESSALON PERLES) 100 MG capsule; Take 1 capsule (100 mg total) by mouth 3 (three) times daily as needed for cough.  Dispense: 20 capsule; Refill: 2  2. Interstitial lung disease ([place]) Review of CT scan that was done last December findings that were noted that could be consistent with interstitial disease.  Patient does not demonstrate any dyspnea productive cough so I do not think that this is a primary concern but should cough and certainly if shortness of breath develop her neck step will be to have pulmonary consultation.

## 2021-05-31 ENCOUNTER — Telehealth: Payer: Self-pay | Admitting: *Deleted

## 2021-05-31 NOTE — Telephone Encounter (Signed)
No xeloda is not causing any sinus issues

## 2021-05-31 NOTE — Telephone Encounter (Signed)
Called stephanie and got her voice mail and told her that Dr. Janese Banks states that the xeloda would not cause this. Can call me back if needed

## 2021-05-31 NOTE — Telephone Encounter (Signed)
Patient's daughter called to report that patient has had a sinus infection for 4 weeks that has not responded to treatment from PCP. She is wondering if the drug she took for cancer could be affecting her.

## 2021-06-03 ENCOUNTER — Telehealth: Payer: Self-pay | Admitting: Family Medicine

## 2021-06-03 ENCOUNTER — Encounter: Payer: Self-pay | Admitting: Oncology

## 2021-06-03 NOTE — Telephone Encounter (Signed)
Copied from [facility] [phone]. Topic: General - Other ?>> Jun 03, 2021 10:03 AM Pawlus, Brayton Layman A wrote: ?Reason for CRM: Pt called in requesting to speak directly with Baxter Flattery, pt stated she has completed taking benzonatate (TESSALON PERLES) 100 MG capsule and wanted to further discuss. ?

## 2021-06-08 ENCOUNTER — Other Ambulatory Visit: Payer: Self-pay | Admitting: *Deleted

## 2021-06-08 ENCOUNTER — Ambulatory Visit: Payer: Medicare PPO

## 2021-06-08 DIAGNOSIS — E538 Deficiency of other specified B group vitamins: Secondary | ICD-10-CM

## 2021-06-08 DIAGNOSIS — D509 Iron deficiency anemia, unspecified: Secondary | ICD-10-CM

## 2021-06-08 DIAGNOSIS — Z8543 Personal history of malignant neoplasm of ovary: Secondary | ICD-10-CM

## 2021-06-08 DIAGNOSIS — Z08 Encounter for follow-up examination after completed treatment for malignant neoplasm: Secondary | ICD-10-CM

## 2021-06-08 DIAGNOSIS — C180 Malignant neoplasm of cecum: Secondary | ICD-10-CM

## 2021-06-09 ENCOUNTER — Other Ambulatory Visit: Payer: Self-pay

## 2021-06-09 ENCOUNTER — Ambulatory Visit (INDEPENDENT_AMBULATORY_CARE_PROVIDER_SITE_OTHER): Payer: Medicare PPO

## 2021-06-09 DIAGNOSIS — E538 Deficiency of other specified B group vitamins: Secondary | ICD-10-CM

## 2021-06-09 MED ORDER — CYANOCOBALAMIN 1000 MCG/ML IJ SOLN
1000.0000 ug | Freq: Once | INTRAMUSCULAR | Status: AC
  Administered 2021-06-09: 1000 ug via INTRAMUSCULAR

## 2021-06-16 ENCOUNTER — Ambulatory Visit: Payer: Medicare PPO | Admitting: Oncology

## 2021-06-16 ENCOUNTER — Other Ambulatory Visit: Payer: Medicare PPO

## 2021-06-17 ENCOUNTER — Ambulatory Visit: Payer: Medicare PPO

## 2021-06-17 ENCOUNTER — Other Ambulatory Visit: Payer: Self-pay

## 2021-06-17 ENCOUNTER — Encounter: Payer: Self-pay | Admitting: Oncology

## 2021-06-17 ENCOUNTER — Ambulatory Visit: Payer: Medicare PPO | Admitting: Nurse Practitioner

## 2021-06-17 ENCOUNTER — Other Ambulatory Visit: Payer: Medicare PPO

## 2021-06-17 ENCOUNTER — Inpatient Hospital Stay: Payer: Medicare PPO | Attending: Oncology

## 2021-06-17 ENCOUNTER — Inpatient Hospital Stay: Payer: Medicare PPO | Admitting: Oncology

## 2021-06-17 VITALS — BP 136/69 | HR 63 | Temp 98.8°F | Resp 16 | Wt 107.2 lb

## 2021-06-17 DIAGNOSIS — R7989 Other specified abnormal findings of blood chemistry: Secondary | ICD-10-CM | POA: Insufficient documentation

## 2021-06-17 DIAGNOSIS — Z08 Encounter for follow-up examination after completed treatment for malignant neoplasm: Secondary | ICD-10-CM

## 2021-06-17 DIAGNOSIS — J40 Bronchitis, not specified as acute or chronic: Secondary | ICD-10-CM | POA: Insufficient documentation

## 2021-06-17 DIAGNOSIS — C180 Malignant neoplasm of cecum: Secondary | ICD-10-CM

## 2021-06-17 DIAGNOSIS — D509 Iron deficiency anemia, unspecified: Secondary | ICD-10-CM | POA: Diagnosis not present

## 2021-06-17 DIAGNOSIS — M810 Age-related osteoporosis without current pathological fracture: Secondary | ICD-10-CM | POA: Diagnosis not present

## 2021-06-17 DIAGNOSIS — I10 Essential (primary) hypertension: Secondary | ICD-10-CM | POA: Insufficient documentation

## 2021-06-17 LAB — COMPREHENSIVE METABOLIC PANEL
ALT: 78 U/L — ABNORMAL HIGH (ref 0–44)
AST: 44 U/L — ABNORMAL HIGH (ref 15–41)
Albumin: 3.6 g/dL (ref 3.5–5.0)
Alkaline Phosphatase: 53 U/L (ref 38–126)
Anion gap: 8 (ref 5–15)
BUN: 24 mg/dL — ABNORMAL HIGH (ref 8–23)
CO2: 28 mmol/L (ref 22–32)
Calcium: 8.9 mg/dL (ref 8.9–10.3)
Chloride: 103 mmol/L (ref 98–111)
Creatinine, Ser: 0.86 mg/dL (ref 0.44–1.00)
GFR, Estimated: >60 mL/min (ref >60)
Glucose, Bld: 102 mg/dL — ABNORMAL HIGH (ref 70–99)
Potassium: 4 mmol/L (ref 3.5–5.1)
Sodium: 139 mmol/L (ref 135–145)
Total Bilirubin: 1.4 mg/dL — ABNORMAL HIGH (ref 0.3–1.2)
Total Protein: 7.2 g/dL (ref 6.5–8.1)

## 2021-06-17 LAB — CBC WITH DIFFERENTIAL/PLATELET
Abs Immature Granulocytes: 0.01 10*3/uL (ref 0.00–0.07)
Basophils Absolute: 0.1 10*3/uL (ref 0.0–0.1)
Basophils Relative: 1 %
Eosinophils Absolute: 0.1 10*3/uL (ref 0.0–0.5)
Eosinophils Relative: 2 %
HCT: 33.6 % — ABNORMAL LOW (ref 36.0–46.0)
Hemoglobin: 10.3 g/dL — ABNORMAL LOW (ref 12.0–15.0)
Immature Granulocytes: 0 %
Lymphocytes Relative: 27 %
Lymphs Abs: 2.1 10*3/uL (ref 0.7–4.0)
MCH: 21.5 pg — ABNORMAL LOW (ref 26.0–34.0)
MCHC: 30.7 g/dL (ref 30.0–36.0)
MCV: 70 fL — ABNORMAL LOW (ref 80.0–100.0)
Monocytes Absolute: 0.7 10*3/uL (ref 0.1–1.0)
Monocytes Relative: 9 %
Neutro Abs: 4.9 10*3/uL (ref 1.7–7.7)
Neutrophils Relative %: 61 %
Platelets: 284 10*3/uL (ref 150–400)
RBC: 4.8 MIL/uL (ref 3.87–5.11)
RDW: 15.9 % — ABNORMAL HIGH (ref 11.5–15.5)
WBC: 8 10*3/uL (ref 4.0–10.5)
nRBC: 0 % (ref 0.0–0.2)

## 2021-06-17 NOTE — Progress Notes (Signed)
Pt has been dealing with bronchitis for the past five weeks or so currently on medications but feeling tired. ?

## 2021-06-17 NOTE — Progress Notes (Signed)
? ? ? ?Hematology/Oncology Consult note ?[place]  ?[phone] [phone] ? ?Patient Care Team: ?Juline Patch, MD as PCP - General (Family Medicine) ?Corey Skains, MD as Consulting Physician (Cardiology) ?Clent Jacks, RN as Oncology Nurse Navigator ?Ronny Bacon, MD as Consulting Physician (General Surgery) ?Sindy Guadeloupe, MD as Consulting Physician (Oncology)  ? ?Name of the patient: Phyllis Ross  ?[id number]  ?04-Dec-1935  ? ?Date of visit: 06/17/21 ? ?Diagnosis-cecal adenocarcinoma on adjuvant Xeloda ? ?Chief complaint/ Reason for visit-routine follow-up of cecal cancer  ? 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  She is s/p laparoscopic right hemicolectomy.  Pathology showed 5.2 x 4.3 x 2.7 cm grade 2 moderately differentiated adenocarcinoma with invasion through muscularis propria into pericolorectal tissue.  7 of 27 lymph nodes positive.  Lymphovascular invasion present but no perineural invasion.  MMR negative.  [id number].  Right hemicolectomy complicated by anastomotic breakdown/leak which was treated with antibiotics.  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  Also has a history of baseline microcytic anemia secondary to beta thalassemia and baseline hemoglobin is between 10-11. ?  ?Patient has met with Dr. Mike Gip in the past management chemotherapy options including oral Xeloda has been offered to the patient ?  ?Patient started adjuvant Xeloda on 08/23/2020. It was held after 4 cycles due to worsening anemia and restarted at a lower dose ? ?Interval history-Phyllis Ross is an 86 year old female who presents today with her daughter Phyllis Ross.  She is here for routine 60-monthfollow-up for cecal cancer.  Imaging from 03/08/2021 did not reveal  any recurrent or metastatic disease.  She completed 8 cycles of Xeloda last year. ? ?Continues to recover from bronchitis.  She was treated with antibiotics and steroids and continues to have some residual coughing but overall feels she is improving.  Having some insomnia.  Feels overall weak due to not eating as well since recovering from bronchitis.  Denies any abdominal pain, constipation or diarrhea.  Denies any additional or new medications. ? ? ?ECOG PS- 1 ?Pain scale- 0 ? ? ?Review of systems- Review of Systems  ?Constitutional:  Positive for malaise/fatigue. Negative for chills, fever and weight loss.  ?HENT:  Negative for congestion, ear pain and tinnitus.   ?Eyes: Negative.  Negative for blurred vision and double vision.  ?Respiratory:  Positive for cough. Negative for sputum production and shortness of breath.   ?Cardiovascular: Negative.  Negative for chest pain, palpitations and leg swelling.  ?Gastrointestinal: Negative.  Negative for abdominal pain, constipation, diarrhea, nausea and vomiting.  ?Genitourinary:  Negative for dysuria, frequency and urgency.  ?Musculoskeletal:  Negative for back pain and falls.  ?Skin: Negative.  Negative for rash.  ?Neurological:  Positive for weakness. Negative for headaches.  ?Endo/Heme/Allergies: Negative.  Does not bruise/bleed easily.  ?Psychiatric/Behavioral: Negative.  Negative for depression. The patient is not nervous/anxious and does not have insomnia.    ? ? ?Allergies  ?Allergen Reactions  ? Trazodone And Nefazodone Other (See Comments)  ?  Made her pass out  ? ? ? ?Past Medical History:  ?Diagnosis Date  ? Anemia   ? Anxiety   ? Cancer ([facility]   ? Hyperlipidemia   ? Hypertension   ? MI (myocardial infarction) ([place] 2008  ? triple bypass  ? Osteoporosis   ?  Pneumonia   ? as a baby  ? Rheumatic fever   ? ? ? ?Past Surgical History:  ?Procedure Laterality Date  ? COLONOSCOPY  2011  ? normal- Dr Vira Agar  ? COLONOSCOPY WITH PROPOFOL N/A 06/04/2020  ? Procedure:  COLONOSCOPY WITH PROPOFOL;  Surgeon: Lucilla Lame, MD;  Location: [facility];  Service: Endoscopy;  Laterality: N/A;  ? CORONARY ARTERY BYPASS GRAFT    ? ESOPHAGOGASTRODUODENOSCOPY (EGD) WITH PROPOFOL N/A 06/04/2020  ? Procedure: ESOPHAGOGASTRODUODENOSCOPY (EGD) WITH PROPOFOL;  Surgeon: Lucilla Lame, MD;  Location: [facility];  Service: Endoscopy;  Laterality: N/A;  ? VAGINAL HYSTERECTOMY    ? DUB  ? ? ?Social History  ? ?Socioeconomic History  ? Marital status: Married  ?  Spouse name: Not on file  ? Number of children: 2  ? Years of education: Not on file  ? Highest education level: Some college, no degree  ?Occupational History  ?  Employer: RETIRED  ?Tobacco Use  ? Smoking status: Never  ? Smokeless tobacco: Never  ?Vaping Use  ? Vaping Use: Never used  ?Substance and Sexual Activity  ? Alcohol use: No  ? Drug use: No  ? Sexual activity: Yes  ?Other Topics Concern  ? Not on file  ?Social History Narrative  ? Not on file  ? ?Social Determinants of Health  ? ?Financial Resource Strain: Low Risk   ? Difficulty of Paying Living Expenses: Not hard at all  ?Food Insecurity: No Food Insecurity  ? Worried About Charity fundraiser in the Last Year: Never true  ? Ran Out of Food in the Last Year: Never true  ?Transportation Needs: No Transportation Needs  ? Lack of Transportation (Medical): No  ? Lack of Transportation (Non-Medical): No  ?Physical Activity: Insufficiently Active  ? Days of Exercise per Week: 4 days  ? Minutes of Exercise per Session: 10 min  ?Stress: No Stress Concern Present  ? Feeling of Stress : Not at all  ?Social Connections: Socially Integrated  ? Frequency of Communication with Friends and Family: More than three times a week  ? Frequency of Social Gatherings with Friends and Family: More than three times a week  ? Attends Religious Services: More than 4 times per year  ? Active Member of Clubs or Organizations: Yes  ? Attends Archivist Meetings: More than 4 times per  year  ? Marital Status: Married  ?Intimate Partner Violence: Not At Risk  ? Fear of Current or Ex-Partner: No  ? Emotionally Abused: No  ? Physically Abused: No  ? Sexually Abused: No  ? ? ?Family History  ?Problem Relation Age of Onset  ? Heart attack Mother   ? Heart attack Maternal Uncle   ? Heart attack Maternal Uncle   ? ? ? ?Current Outpatient Medications:  ?  atorvastatin (LIPITOR) 80 MG tablet, TAKE ONE TABLET BY MOUTH ONCE DAILY, Disp: 90 tablet, Rfl: 1 ?  azelastine (ASTELIN) 0.1 % nasal spray, Place 1 spray into both nostrils 2 (two) times daily. Use in each nostril as directed, Disp: 30 mL, Rfl: 12 ?  ferrous sulfate 325 (65 FE) MG tablet, Take 325 mg by mouth daily with breakfast., Disp: , Rfl:  ?  fluconazole (DIFLUCAN) 150 MG tablet, Take by mouth., Disp: , Rfl:  ?  loperamide (IMODIUM) 2 MG capsule, Take 2 mg by mouth as needed for diarrhea or loose stools., Disp: , Rfl:  ?  loratadine (CLARITIN) 10 MG tablet, Take 1 tablet (10  mg total) by mouth daily., Disp: 30 tablet, Rfl: 11 ?  montelukast (SINGULAIR) 10 MG tablet, Take 1 tablet (10 mg total) by mouth at bedtime., Disp: 30 tablet, Rfl: 3 ?  nystatin (MYCOSTATIN) 100000 UNIT/ML suspension, Take 5 mLs (500,000 Units total) by mouth 4 (four) times daily., Disp: 60 mL, Rfl: 0 ?  Potassium (POTASSIMIN) 75 MG TABS, Take by mouth., Disp: , Rfl:  ?  potassium chloride SA (KLOR-CON M) 20 MEQ tablet, Take 1 tablet (20 mEq total) by mouth daily., Disp: 30 tablet, Rfl: 2 ?  vitamin B-12 (CYANOCOBALAMIN) 1000 MCG tablet, Take 1,000 mcg by mouth daily., Disp: , Rfl:  ?  ALPRAZolam (XANAX) 0.25 MG tablet, Take 1 tablet (0.25 mg total) by mouth daily as needed for anxiety. (Patient not taking: Reported on 06/17/2021), Disp: 30 tablet, Rfl: 5 ?  benzonatate (TESSALON PERLES) 100 MG capsule, Take 1 capsule (100 mg total) by mouth 3 (three) times daily as needed for cough. (Patient not taking: Reported on 06/17/2021), Disp: 20 capsule, Rfl: 2 ? ?Physical exam:   ?Vitals:  ? 06/17/21 1417  ?BP: 136/69  ?Pulse: 63  ?Resp: 16  ?Temp: 98.8 ?F (37.1 ?C)  ?SpO2: 98%  ?Weight: 107 lb 3.2 oz (48.6 kg)  ? ?Physical Exam ?Constitutional:   ?   Appearance: Normal appearance.  ?HEN

## 2021-06-18 LAB — CA 125: Cancer Antigen (CA) 125: 25.7 U/mL (ref 0.0–38.1)

## 2021-06-18 LAB — CEA: CEA: 4.8 ng/mL — ABNORMAL HIGH (ref 0.0–4.7)

## 2021-06-24 ENCOUNTER — Encounter: Payer: Self-pay | Admitting: Oncology

## 2021-06-25 ENCOUNTER — Other Ambulatory Visit: Payer: Self-pay | Admitting: *Deleted

## 2021-06-25 DIAGNOSIS — E876 Hypokalemia: Secondary | ICD-10-CM

## 2021-06-25 MED ORDER — POTASSIUM CHLORIDE CRYS ER 20 MEQ PO TBCR: 20.0000 meq | EXTENDED_RELEASE_TABLET | Freq: Every day | ORAL | 2 refills | Status: DC

## 2021-07-12 ENCOUNTER — Ambulatory Visit (INDEPENDENT_AMBULATORY_CARE_PROVIDER_SITE_OTHER): Payer: Medicare PPO

## 2021-07-12 DIAGNOSIS — E538 Deficiency of other specified B group vitamins: Secondary | ICD-10-CM

## 2021-07-12 MED ORDER — CYANOCOBALAMIN 1000 MCG/ML IJ SOLN
1000.0000 ug | Freq: Once | INTRAMUSCULAR | Status: AC
  Administered 2021-07-12: 1000 ug via INTRAMUSCULAR

## 2021-07-23 DIAGNOSIS — Z872 Personal history of diseases of the skin and subcutaneous tissue: Secondary | ICD-10-CM | POA: Diagnosis not present

## 2021-07-23 DIAGNOSIS — D1801 Hemangioma of skin and subcutaneous tissue: Secondary | ICD-10-CM | POA: Diagnosis not present

## 2021-07-23 DIAGNOSIS — Z789 Other specified health status: Secondary | ICD-10-CM | POA: Diagnosis not present

## 2021-07-23 DIAGNOSIS — D485 Neoplasm of uncertain behavior of skin: Secondary | ICD-10-CM | POA: Diagnosis not present

## 2021-07-23 DIAGNOSIS — L821 Other seborrheic keratosis: Secondary | ICD-10-CM | POA: Diagnosis not present

## 2021-07-23 DIAGNOSIS — L820 Inflamed seborrheic keratosis: Secondary | ICD-10-CM | POA: Diagnosis not present

## 2021-07-23 DIAGNOSIS — L814 Other melanin hyperpigmentation: Secondary | ICD-10-CM | POA: Diagnosis not present

## 2021-07-23 DIAGNOSIS — C44319 Basal cell carcinoma of skin of other parts of face: Secondary | ICD-10-CM | POA: Diagnosis not present

## 2021-07-23 DIAGNOSIS — Z85828 Personal history of other malignant neoplasm of skin: Secondary | ICD-10-CM | POA: Diagnosis not present

## 2021-07-23 DIAGNOSIS — Z08 Encounter for follow-up examination after completed treatment for malignant neoplasm: Secondary | ICD-10-CM | POA: Diagnosis not present

## 2021-08-02 ENCOUNTER — Ambulatory Visit: Payer: Self-pay | Admitting: *Deleted

## 2021-08-02 ENCOUNTER — Other Ambulatory Visit: Payer: Self-pay

## 2021-08-02 NOTE — Telephone Encounter (Signed)
?  Chief Complaint: possible arthritis in thumb- mild pain- declines appointment- just wants advice ?Symptoms: Advised some people find relief with : warm soaks-epsom salts, Aspercreme  ?Frequency:   ?Pertinent Negatives: Patient denies moderate/severe pain ?Disposition: '[]'$ ED /'[]'$ Urgent Care (no appt availability in office) / '[]'$ Appointment(In office/virtual)/ '[]'$  [facility]/ '[]'$ Home Care/ '[x]'$ Refused Recommended Disposition /'[]'$ [facility]/ '[]'$  Follow-up with PCP ?Additional Notes: Declines appointment at this time- patient advised hard to treat without knowing cause- cyst, bone spur, arthritis, etc. - patient will call back if she feels she needs further attention   ?

## 2021-08-02 NOTE — Telephone Encounter (Signed)
Summary: Pt having pain in her thumbs advice  ? Pt is calling because she is having pain in her thumb 5 out 10 and is wanting advice. Pt declined appt   ?  ? ?Reason for Disposition ? [1] MILD pain (e.g., does not interfere with normal activities) AND [2] present > 7 days ? ?Answer Assessment - Initial Assessment Questions ?1. ONSET: "When did the pain start?"  ?    recent ?2. LOCATION and RADIATION: "Where is the pain located?"  (e.g., fingertip, around nail, joint, entire  ?finger)  ?    L thumb- joint ?3. SEVERITY: "How bad is the pain?" "What does it keep you from doing?"   (Scale 1-10; or mild, moderate, severe) ? - MILD (1-3): doesn't interfere with normal activities.  ? - MODERATE (4-7): interferes with normal activities or awakens from sleep. ? - SEVERE (8-10): excruciating pain, unable to hold a glass of water or bend finger even a little. ?    mild ?4. APPEARANCE: "What does the finger look like?" (e.g., redness, swelling, bruising, pallor) ?    Maybe small knot at joint ?5. WORK OR EXERCISE: "Has there been any recent work or exercise that involved this part (i.e., fingers or hand) of the body?" ?    no ?6. CAUSE: "What do you think is causing the pain?" ?    Possible arthritis ?Patient is calling for advised on what she can do at home for beginning arthritis- declines need for appointment ? ?Protocols used: Finger Pain-A-AH ? ?

## 2021-08-06 ENCOUNTER — Other Ambulatory Visit: Payer: Self-pay

## 2021-08-06 DIAGNOSIS — M19049 Primary osteoarthritis, unspecified hand: Secondary | ICD-10-CM

## 2021-08-06 MED ORDER — MELOXICAM 7.5 MG PO TABS: 7.5000 mg | ORAL_TABLET | Freq: Every day | ORAL | 0 refills | Status: DC

## 2021-08-12 ENCOUNTER — Ambulatory Visit (INDEPENDENT_AMBULATORY_CARE_PROVIDER_SITE_OTHER): Payer: Medicare PPO

## 2021-08-12 DIAGNOSIS — E538 Deficiency of other specified B group vitamins: Secondary | ICD-10-CM | POA: Diagnosis not present

## 2021-08-12 MED ORDER — CYANOCOBALAMIN 1000 MCG/ML IJ SOLN
1000.0000 ug | Freq: Once | INTRAMUSCULAR | Status: AC
  Administered 2021-08-12: 1000 ug via INTRAMUSCULAR

## 2021-09-03 ENCOUNTER — Other Ambulatory Visit: Payer: Self-pay | Admitting: Family Medicine

## 2021-09-03 DIAGNOSIS — M19049 Primary osteoarthritis, unspecified hand: Secondary | ICD-10-CM

## 2021-09-14 ENCOUNTER — Ambulatory Visit: Payer: Medicare PPO

## 2021-09-15 ENCOUNTER — Ambulatory Visit (INDEPENDENT_AMBULATORY_CARE_PROVIDER_SITE_OTHER): Payer: Medicare PPO

## 2021-09-15 DIAGNOSIS — E538 Deficiency of other specified B group vitamins: Secondary | ICD-10-CM

## 2021-09-15 MED ORDER — CYANOCOBALAMIN 1000 MCG/ML IJ SOLN
1000.0000 ug | Freq: Once | INTRAMUSCULAR | Status: AC
  Administered 2021-09-15: 1000 ug via INTRAMUSCULAR

## 2021-09-16 DIAGNOSIS — Z961 Presence of intraocular lens: Secondary | ICD-10-CM | POA: Diagnosis not present

## 2021-09-16 DIAGNOSIS — H01131 Eczematous dermatitis of right upper eyelid: Secondary | ICD-10-CM | POA: Diagnosis not present

## 2021-09-16 DIAGNOSIS — H43813 Vitreous degeneration, bilateral: Secondary | ICD-10-CM | POA: Diagnosis not present

## 2021-09-17 DIAGNOSIS — L570 Actinic keratosis: Secondary | ICD-10-CM | POA: Diagnosis not present

## 2021-09-17 DIAGNOSIS — X32XXXA Exposure to sunlight, initial encounter: Secondary | ICD-10-CM | POA: Diagnosis not present

## 2021-09-17 DIAGNOSIS — L821 Other seborrheic keratosis: Secondary | ICD-10-CM | POA: Diagnosis not present

## 2021-09-17 DIAGNOSIS — C44319 Basal cell carcinoma of skin of other parts of face: Secondary | ICD-10-CM | POA: Diagnosis not present

## 2021-09-17 DIAGNOSIS — H00019 Hordeolum externum unspecified eye, unspecified eyelid: Secondary | ICD-10-CM | POA: Diagnosis not present

## 2021-09-18 DIAGNOSIS — H00021 Hordeolum internum right upper eyelid: Secondary | ICD-10-CM | POA: Diagnosis not present

## 2021-09-18 DIAGNOSIS — Z961 Presence of intraocular lens: Secondary | ICD-10-CM | POA: Diagnosis not present

## 2021-09-18 DIAGNOSIS — H43813 Vitreous degeneration, bilateral: Secondary | ICD-10-CM | POA: Diagnosis not present

## 2021-09-22 ENCOUNTER — Other Ambulatory Visit: Payer: Self-pay | Admitting: Family Medicine

## 2021-09-22 DIAGNOSIS — E7849 Other hyperlipidemia: Secondary | ICD-10-CM

## 2021-09-22 NOTE — Telephone Encounter (Signed)
Requested Prescriptions  Pending Prescriptions Disp Refills  . atorvastatin (LIPITOR) 80 MG tablet [Pharmacy Med Name: ATORVASTATIN CALCIUM 80 MG TAB] 90 tablet 1    Sig: TAKE (1) TABLET BY MOUTH EVERY DAY     Cardiovascular:  Antilipid - Statins Failed - 09/22/2021 12:15 PM      Failed - Lipid Panel in normal range within the last 12 months    Cholesterol, Total  Date Value Ref Range Status  03/21/2019 168 100 - 199 mg/dL Final   Cholesterol  Date Value Ref Range Status  10/07/2020 118 0 - 200 mg/dL Final   LDL Chol Calc (NIH)  Date Value Ref Range Status  03/21/2019 92 0 - 99 mg/dL Final   LDL Cholesterol  Date Value Ref Range Status  10/07/2020 53 0 - 99 mg/dL Final    Comment:           Total Cholesterol/HDL:CHD Risk Coronary Heart Disease Risk Table                     Men   Women  1/2 Average Risk   3.4   3.3  Average Risk       5.0   4.4  2 X Average Risk   9.6   7.1  3 X Average Risk  23.4   11.0        Use the calculated Patient Ratio above and the CHD Risk Table to determine the patient's CHD Risk.        ATP III CLASSIFICATION (LDL):  <100     mg/dL   Optimal  100-129  mg/dL   Near or Above                    Optimal  130-159  mg/dL   Borderline  160-189  mg/dL   High  >190     mg/dL   Very High Performed at [facility], [place], [place] [address]    HDL  Date Value Ref Range Status  10/07/2020 43 >40 mg/dL Final  03/21/2019 54 >39 mg/dL Final   Triglycerides  Date Value Ref Range Status  10/07/2020 109 <150 mg/dL Final         Passed - Patient is not pregnant      Passed - Valid encounter within last 12 months    Recent Outpatient Visits          3 months ago Bronchitis, allergic, unspecified asthma severity, with acute exacerbation   [facility] Juline Patch, MD   4 months ago Acute torticollis   [facility] Juline Patch, MD   4 months ago [facility] Juline Patch, MD   4 months ago Acute maxillary sinusitis, recurrence not specified   [facility] Juline Patch, MD   5 months ago Familial multiple lipoprotein-type hyperlipidemia   [facility] Juline Patch, MD      Future Appointments            In 1 week Juline Patch, MD [facility], [facility]

## 2021-09-30 ENCOUNTER — Ambulatory Visit (INDEPENDENT_AMBULATORY_CARE_PROVIDER_SITE_OTHER): Payer: Medicare PPO | Admitting: Family Medicine

## 2021-09-30 ENCOUNTER — Ambulatory Visit
Admission: RE | Admit: 2021-09-30 | Discharge: 2021-09-30 | Disposition: A | Discharge status: Home / Self Care | Payer: Medicare PPO | Attending: Family Medicine | Admitting: Family Medicine

## 2021-09-30 ENCOUNTER — Encounter: Payer: Self-pay | Admitting: Family Medicine

## 2021-09-30 ENCOUNTER — Ambulatory Visit
Admission: RE | Admit: 2021-09-30 | Discharge: 2021-09-30 | Disposition: A | Discharge status: Home / Self Care | Payer: Medicare PPO | Source: Ambulatory Visit | Attending: Family Medicine | Admitting: Family Medicine

## 2021-09-30 ENCOUNTER — Telehealth: Payer: Self-pay | Admitting: Family Medicine

## 2021-09-30 VITALS — BP 120/80 | HR 60 | Ht 64.5 in | Wt 110.0 lb

## 2021-09-30 DIAGNOSIS — R0781 Pleurodynia: Secondary | ICD-10-CM

## 2021-09-30 DIAGNOSIS — E7849 Other hyperlipidemia: Secondary | ICD-10-CM

## 2021-09-30 DIAGNOSIS — J45901 Unspecified asthma with (acute) exacerbation: Secondary | ICD-10-CM | POA: Diagnosis not present

## 2021-09-30 DIAGNOSIS — N3001 Acute cystitis with hematuria: Secondary | ICD-10-CM

## 2021-09-30 DIAGNOSIS — J0100 Acute maxillary sinusitis, unspecified: Secondary | ICD-10-CM | POA: Diagnosis not present

## 2021-09-30 DIAGNOSIS — M19049 Primary osteoarthritis, unspecified hand: Secondary | ICD-10-CM

## 2021-09-30 DIAGNOSIS — R0789 Other chest pain: Secondary | ICD-10-CM | POA: Diagnosis not present

## 2021-09-30 DIAGNOSIS — Z9889 Other specified postprocedural states: Secondary | ICD-10-CM | POA: Diagnosis not present

## 2021-09-30 LAB — POCT URINALYSIS DIPSTICK
Bilirubin, UA: NEGATIVE
Glucose, UA: NEGATIVE
Ketones, UA: NEGATIVE
Nitrite, UA: NEGATIVE
Protein, UA: POSITIVE — AB
Spec Grav, UA: 1.01 (ref 1.010–1.025)
Urobilinogen, UA: 0.2 E.U./dL
pH, UA: 7 (ref 5.0–8.0)

## 2021-09-30 MED ORDER — ATORVASTATIN CALCIUM 80 MG PO TABS: ORAL_TABLET | ORAL | 0 refills | Status: DC

## 2021-09-30 MED ORDER — NITROFURANTOIN MONOHYD MACRO 100 MG PO CAPS: 100.0000 mg | ORAL_CAPSULE | Freq: Two times a day (BID) | ORAL | 0 refills | Status: AC

## 2021-09-30 MED ORDER — LORATADINE 10 MG PO TABS: 10.0000 mg | ORAL_TABLET | Freq: Every day | ORAL | 11 refills | Status: DC

## 2021-09-30 MED ORDER — MELOXICAM 7.5 MG PO TABS: ORAL_TABLET | ORAL | 2 refills | Status: DC

## 2021-09-30 MED ORDER — MONTELUKAST SODIUM 10 MG PO TABS: 10.0000 mg | ORAL_TABLET | Freq: Every day | ORAL | 3 refills | Status: DC

## 2021-09-30 NOTE — Progress Notes (Signed)
Date:  09/30/2021   Name:  Phyllis Ross   DOB:  Jun 01, 1935   MRN:  [id number]   Chief Complaint: Arthritis (Meloxicam helps with this), Allergic Rhinitis , and Hyperlipidemia  Flank pain  Arthritis Presents for follow-up visit. She reports no pain, stiffness, joint swelling or joint warmth. The symptoms have been stable. Pertinent negatives include no diarrhea, dysuria, fever or rash.  Hyperlipidemia The current episode started more than 1 year ago. The problem is controlled. Recent lipid tests were reviewed and are variable. She has no history of chronic renal disease, diabetes, hypothyroidism, liver disease, obesity or nephrotic syndrome. Factors aggravating her hyperlipidemia include thiazides. Associated symptoms include chest pain. Pertinent negatives include no focal sensory loss, focal weakness, leg pain, myalgias or shortness of breath. Current antihyperlipidemic treatment includes statins.  Chest Pain  This is a chronic problem. The current episode started more than 1 year ago. The problem occurs intermittently. The problem has been waxing and waning. Pertinent negatives include no abdominal pain, back pain, cough, dizziness, fever, headaches, leg pain, nausea, palpitations or shortness of breath.  Her past medical history is significant for hyperlipidemia.  Pertinent negatives for past medical history include no diabetes.    Lab Results  Component Value Date   NA 139 06/17/2021   K 4.0 06/17/2021   CO2 28 06/17/2021   GLUCOSE 102 (H) 06/17/2021   BUN 24 (H) 06/17/2021   CREATININE 0.86 06/17/2021   CALCIUM 8.9 06/17/2021   GFRNONAA >60 06/17/2021   Lab Results  Component Value Date   CHOL 118 10/07/2020   HDL 43 10/07/2020   LDLCALC 53 10/07/2020   TRIG 109 10/07/2020   CHOLHDL 2.7 10/07/2020   No results found for: "TSH" Lab Results  Component Value Date   HGBA1C 5.5 04/07/2020   Lab Results  Component Value Date   WBC 8.0 06/17/2021   HGB 10.3 (L)  06/17/2021   HCT 33.6 (L) 06/17/2021   MCV 70.0 (L) 06/17/2021   PLT 284 06/17/2021   Lab Results  Component Value Date   ALT 78 (H) 06/17/2021   AST 44 (H) 06/17/2021   ALKPHOS 53 06/17/2021   BILITOT 1.4 (H) 06/17/2021   No results found for: "25OHVITD2", "25OHVITD3", "VD25OH"   Review of Systems  Constitutional:  Negative for chills and fever.  HENT:  Negative for drooling, ear discharge, ear pain and sore throat.   Respiratory:  Negative for cough, shortness of breath and wheezing.   Cardiovascular:  Positive for chest pain. Negative for palpitations and leg swelling.  Gastrointestinal:  Negative for abdominal pain, blood in stool, constipation, diarrhea and nausea.  Endocrine: Negative for polydipsia.  Genitourinary:  Negative for dysuria, frequency, hematuria and urgency.  Musculoskeletal:  Positive for arthritis. Negative for back pain, joint swelling, myalgias, neck pain and stiffness.  Skin:  Negative for rash.  Allergic/Immunologic: Negative for environmental allergies.  Neurological:  Negative for dizziness, focal weakness and headaches.  Hematological:  Does not bruise/bleed easily.  Psychiatric/Behavioral:  Negative for suicidal ideas. The patient is not nervous/anxious.     Patient Active Problem List   Diagnosis Date Noted   B12 deficiency 09/16/2020   Goals of care, counseling/discussion 08/07/2020   Microcytic anemia 07/23/2020   Status post partial colectomy 07/02/2020   Protein-calorie malnutrition, severe 06/24/2020   Cecal cancer ([place]) 06/15/2020   Iron deficiency anemia due to chronic blood loss    Neoplasm of lower gastrointestinal tract    Acute gastritis with hemorrhage  Personal history of other malignant neoplasm of skin 01/21/2019   Atherosclerosis of abdominal aorta ([place]) 12/26/2017   Age-related osteoporosis without current pathological fracture 06/16/2016   Encounter for follow-up surveillance of ovarian cancer 05/20/2015   Familial  multiple lipoprotein-type hyperlipidemia 10/13/2014   Wedging of vertebra ([place]) 10/13/2014   Polypharmacy 10/13/2014   Encounter for general adult medical examination without abnormal findings 10/13/2014   Anxiety 10/13/2014   Essential hypertension 08/27/2014   Bradycardia 08/27/2014   Coronary artery disease of native heart with stable angina pectoris ([place]) 08/27/2014   MI (mitral incompetence) 08/27/2014   TI (tricuspid incompetence) 08/27/2014   Combined fat and carbohydrate induced hyperlipemia 08/13/2014   HTN (hypertension), malignant 08/11/2014   Chest pain 08/11/2014   CAD (coronary artery disease) of artery bypass graft 08/11/2014    Allergies  Allergen Reactions   Trazodone And Nefazodone Other (See Comments)    Made her pass out    Past Surgical History:  Procedure Laterality Date   COLONOSCOPY  2011   normal- Dr Vira Agar   COLONOSCOPY WITH PROPOFOL N/A 06/04/2020   Procedure: COLONOSCOPY WITH PROPOFOL;  Surgeon: Lucilla Lame, MD;  Location: [facility];  Service: Endoscopy;  Laterality: N/A;   CORONARY ARTERY BYPASS GRAFT     ESOPHAGOGASTRODUODENOSCOPY (EGD) WITH PROPOFOL N/A 06/04/2020   Procedure: ESOPHAGOGASTRODUODENOSCOPY (EGD) WITH PROPOFOL;  Surgeon: Lucilla Lame, MD;  Location: [facility];  Service: Endoscopy;  Laterality: N/A;   VAGINAL HYSTERECTOMY     DUB    Social History   Tobacco Use   Smoking status: Never   Smokeless tobacco: Never  Vaping Use   Vaping Use: Never used  Substance Use Topics   Alcohol use: No   Drug use: No     Medication list has been reviewed and updated.  Current Meds  Medication Sig   atorvastatin (LIPITOR) 80 MG tablet TAKE (1) TABLET BY MOUTH EVERY DAY   ferrous sulfate 325 (65 FE) MG tablet Take 325 mg by mouth daily with breakfast.   loperamide (IMODIUM) 2 MG capsule Take 2 mg by mouth as needed for diarrhea or loose stools.   loratadine (CLARITIN) 10 MG tablet Take 1 tablet (10 mg total) by mouth  daily.   meloxicam (MOBIC) 7.5 MG tablet TAKE (1) TABLET BY MOUTH EVERY DAY   montelukast (SINGULAIR) 10 MG tablet Take 1 tablet (10 mg total) by mouth at bedtime.   potassium chloride SA (KLOR-CON M) 20 MEQ tablet Take 1 tablet (20 mEq total) by mouth daily.   vitamin B-12 (CYANOCOBALAMIN) 1000 MCG tablet Take 1,000 mcg by mouth daily.   [DISCONTINUED] Potassium (POTASSIMIN) 75 MG TABS Take by mouth.       04/08/2021    2:06 PM 07/20/2020    2:04 PM 09/24/2019    9:46 AM 03/21/2019   10:12 AM  GAD 7 : Generalized Anxiety Score  Nervous, Anxious, on Edge 0 2 0 0  Control/stop worrying 0 2 0 0  Worry too much - different things 0 2 0 0  Trouble relaxing 0 0 0 0  Restless 0 0 0 0  Easily annoyed or irritable 0 1 0 0  Afraid - awful might happen 0 1 0 0  Total GAD 7 Score 0 8 0 0  Anxiety Difficulty Not difficult at all Not difficult at all         04/08/2021    2:06 PM 02/03/2021    3:04 PM 07/20/2020    2:03 PM  Depression  screen PHQ 2/9  Decreased Interest 0 0 0  Down, Depressed, Hopeless 0 0 0  PHQ - 2 Score 0 0 0  Altered sleeping 0  1  Tired, decreased energy 0  1  Change in appetite 0  3  Feeling bad or failure about yourself  0  0  Trouble concentrating 0  0  Moving slowly or fidgety/restless 0  0  Suicidal thoughts 0  0  PHQ-9 Score 0  5  Difficult doing work/chores Not difficult at all  Very difficult    BP Readings from Last 3 Encounters:  09/30/21 120/80  06/17/21 136/69  05/27/21 110/60    Physical Exam Vitals and nursing note reviewed.  Constitutional:      Appearance: She is well-developed.  HENT:     Head: Normocephalic.     Right Ear: Tympanic membrane and external ear normal.     Left Ear: Tympanic membrane and external ear normal.  Eyes:     General: Lids are everted, no foreign bodies appreciated. No scleral icterus.       Left eye: No foreign body or hordeolum.     Conjunctiva/sclera: Conjunctivae normal.     Right eye: Right conjunctiva is  not injected.     Left eye: Left conjunctiva is not injected.     Pupils: Pupils are equal, round, and reactive to light.  Neck:     Thyroid: No thyromegaly.     Vascular: No JVD.     Trachea: No tracheal deviation.  Cardiovascular:     Rate and Rhythm: Normal rate and regular rhythm.     Heart sounds: Normal heart sounds. No murmur heard.    No friction rub. No gallop.  Pulmonary:     Effort: Pulmonary effort is normal. No respiratory distress.     Breath sounds: Normal breath sounds. No wheezing or rales.  Abdominal:     General: Bowel sounds are normal.     Palpations: Abdomen is soft. There is no mass.     Tenderness: There is no abdominal tenderness. There is no guarding or rebound.  Musculoskeletal:        General: No tenderness. Normal range of motion.     Cervical back: Normal range of motion and neck supple.  Lymphadenopathy:     Cervical: No cervical adenopathy.  Skin:    General: Skin is warm.     Findings: No rash.  Neurological:     Mental Status: She is alert and oriented to person, place, and time.     Cranial Nerves: No cranial nerve deficit.     Deep Tendon Reflexes: Reflexes normal.  Psychiatric:        Mood and Affect: Mood is not anxious or depressed.     Wt Readings from Last 3 Encounters:  09/30/21 110 lb (49.9 kg)  06/17/21 107 lb 3.2 oz (48.6 kg)  05/27/21 105 lb (47.6 kg)    BP 120/80   Pulse 60   Ht 5' 4.5" (1.638 m)   Wt 110 lb (49.9 kg)   BMI 18.59 kg/m   Assessment and Plan:  1. Familial multiple lipoprotein-type hyperlipidemia Chronic.  Controlled.  Stable.  Continue atorvastatin 80 mg once a day. - atorvastatin (LIPITOR) 80 MG tablet; TAKE (1) TABLET BY MOUTH EVERY DAY  Dispense: 90 tablet; Refill: 0    2. Bronchitis, allergic, unspecified asthma severity, with acute exacerbation History of bronchitis we will refill Singulair 10 mg once a day. - montelukast (SINGULAIR) 10 MG  tablet; Take 1 tablet (10 mg total) by mouth at  bedtime.  Dispense: 30 tablet; Refill: 3    3. Rib pain on left side New onset.  Patient has concerns that she awoke with pain in approximately the ninth 10th rib lateral aspect on the left side which is consistent with previous areas of concern.  Patient has concerns given her history of cancer of the colon and ovary and we will obtain a unilateral rib x-ray for any osteolytic blastic lytic concerns. - DG Ribs Unilateral Left; Future - POCT urinalysis dipstick  4. Acute cystitis with hematuria Patient was evaluated because upon request and it was noted that she had leukocytes and red cells on examination consistent with an asymptomatic UTI.  We will treat with Macrobid 100 mg twice a day for 3 days - nitrofurantoin, macrocrystal-monohydrate, (MACROBID) 100 MG capsule; Take 1 capsule (100 mg total) by mouth 2 (two) times daily for 3 days.  Dispense: 6 capsule; Refill: 0

## 2021-09-30 NOTE — Telephone Encounter (Signed)
Copied from [place] [id number]. Topic: General - Other >> Sep 30, 2021  3:02 PM Leitha Schuller wrote: Reason for CRM: Patient inquiring if she should have xrays done today 6-29  Please fu w/ patient

## 2021-10-04 ENCOUNTER — Ambulatory Visit: Payer: Medicare PPO | Admitting: Family Medicine

## 2021-10-07 ENCOUNTER — Ambulatory Visit: Payer: Self-pay

## 2021-10-07 NOTE — Telephone Encounter (Signed)
  Chief Complaint: UTI fu Symptoms: flank pain on R side still continues Frequency: since last OV on 09/30/21 Pertinent Negatives: Patient denies dysuria, pain with urination or urgency/frequency Disposition: '[]'$ ED /'[]'$ Urgent Care (no appt availability in office) / '[x]'$ Appointment(In office/virtual)/ '[]'$  [facility]/ '[]'$ Home Care/ '[]'$ Refused Recommended Disposition /'[]'$ [facility]/ '[]'$  Follow-up with PCP Additional Notes: pt has completed abx and unsure if she needs more or not. No urinary symptoms just R flank pain. Scheduled OV for 10/08/21 at 1600 with Dr. Army Melia d/t Dr. Ronnald Ramp being off.  Summary: advice - UTI   Pt called in stating she was seen for a UTI but does not think its fully cleared up yet, pt is not sure if she needs more medication or what to do, please advise.      Reason for Disposition  [1] Taking antibiotic > 24 hours for UTI AND [2] flank or lower back pain worsening  Answer Assessment - Initial Assessment Questions 1. ANTIBIOTIC: "What antibiotic are you taking?" "How many times per day?"     nitrofurantoin BID x 3 days 2. DURATION: "When was the antibiotic started?"     09/30/21 3. MAIN SYMPTOM: "What is the main symptom you are concerned about?"     Flank pain  5. OTHER SYMPTOMS: "Do you have any other symptoms?" (e.g., flank pain, vaginal discharge, blood in urine)     R flank area  Protocols used: Urinary Tract Infection on Antibiotic Follow-up Call - [facility]

## 2021-10-08 ENCOUNTER — Other Ambulatory Visit: Payer: Self-pay | Admitting: Internal Medicine

## 2021-10-08 ENCOUNTER — Encounter: Payer: Self-pay | Admitting: Internal Medicine

## 2021-10-08 ENCOUNTER — Ambulatory Visit (INDEPENDENT_AMBULATORY_CARE_PROVIDER_SITE_OTHER): Payer: Medicare PPO | Admitting: Internal Medicine

## 2021-10-08 VITALS — BP 112/72 | HR 77 | Ht 64.5 in | Wt 110.0 lb

## 2021-10-08 DIAGNOSIS — R109 Unspecified abdominal pain: Secondary | ICD-10-CM | POA: Diagnosis not present

## 2021-10-08 LAB — POC URINALYSIS WITH MICROSCOPIC (NON AUTO)MANUAL RESULT
Bilirubin, UA: NEGATIVE
Crystals: 0
Glucose, UA: NEGATIVE
Ketones, UA: NEGATIVE
Mucus, UA: 0
Nitrite, UA: NEGATIVE
Protein, UA: POSITIVE — AB
RBC: 10 M/uL — AB (ref 4.04–5.48)
Spec Grav, UA: >=1.03 — AB (ref 1.010–1.025)
Urobilinogen, UA: 0.2 E.U./dL
WBC Casts, UA: 20
pH, UA: 6 (ref 5.0–8.0)

## 2021-10-08 NOTE — Progress Notes (Signed)
Date:  10/08/2021   Name:  Phyllis Ross   DOB:  1936/01/25   MRN:  [id number]   Chief Complaint: Urinary Tract Infection  Urinary Tract Infection  This is a recurrent problem. The quality of the pain is described as aching (in the right flank). The pain is mild. There has been no fever. Associated symptoms include flank pain. Pertinent negatives include no chills, frequency, hematuria, hesitancy or urgency. Treatments tried: heat. Improvement on treatment: treated with Macrobid x 3 days a week ago.    Lab Results  Component Value Date   NA 139 06/17/2021   K 4.0 06/17/2021   CO2 28 06/17/2021   GLUCOSE 102 (H) 06/17/2021   BUN 24 (H) 06/17/2021   CREATININE 0.86 06/17/2021   CALCIUM 8.9 06/17/2021   GFRNONAA >60 06/17/2021   Lab Results  Component Value Date   CHOL 118 10/07/2020   HDL 43 10/07/2020   LDLCALC 53 10/07/2020   TRIG 109 10/07/2020   CHOLHDL 2.7 10/07/2020   No results found for: "TSH" Lab Results  Component Value Date   HGBA1C 5.5 04/07/2020   Lab Results  Component Value Date   WBC 8.0 06/17/2021   HGB 10.3 (L) 06/17/2021   HCT 33.6 (L) 06/17/2021   MCV 70.0 (L) 06/17/2021   PLT 284 06/17/2021   Lab Results  Component Value Date   ALT 78 (H) 06/17/2021   AST 44 (H) 06/17/2021   ALKPHOS 53 06/17/2021   BILITOT 1.4 (H) 06/17/2021   No results found for: "25OHVITD2", "25OHVITD3", "VD25OH"   Review of Systems  Constitutional:  Negative for chills, fatigue and fever.  Respiratory:  Negative for chest tightness and shortness of breath.   Cardiovascular:  Negative for chest pain.  Genitourinary:  Positive for flank pain. Negative for dysuria, frequency, hematuria, hesitancy and urgency.    Patient Active Problem List   Diagnosis Date Noted   B12 deficiency 09/16/2020   Goals of care, counseling/discussion 08/07/2020   Microcytic anemia 07/23/2020   Status post partial colectomy 07/02/2020   Protein-calorie malnutrition, severe  06/24/2020   Cecal cancer ([place]) 06/15/2020   Iron deficiency anemia due to chronic blood loss    Neoplasm of lower gastrointestinal tract    Acute gastritis with hemorrhage    Personal history of other malignant neoplasm of skin 01/21/2019   Atherosclerosis of abdominal aorta ([place]) 12/26/2017   Age-related osteoporosis without current pathological fracture 06/16/2016   Encounter for follow-up surveillance of ovarian cancer 05/20/2015   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   Wedging of vertebra ([place]) 10/13/2014   Polypharmacy 10/13/2014   Encounter for general adult medical examination without abnormal findings 10/13/2014   Anxiety 10/13/2014   Essential hypertension 08/27/2014   Bradycardia 08/27/2014   Coronary artery disease of native heart with stable angina pectoris ([place]) 08/27/2014   MI (mitral incompetence) 08/27/2014   TI (tricuspid incompetence) 08/27/2014   Combined fat and carbohydrate induced hyperlipemia 08/13/2014   HTN (hypertension), malignant 08/11/2014   Chest pain 08/11/2014   CAD (coronary artery disease) of artery bypass graft 08/11/2014    Allergies  Allergen Reactions   Trazodone And Nefazodone Other (See Comments)    Made her pass out    Past Surgical History:  Procedure Laterality Date   COLONOSCOPY  2011   normal- Dr Vira Agar   COLONOSCOPY WITH PROPOFOL N/A 06/04/2020   Procedure: COLONOSCOPY WITH PROPOFOL;  Surgeon: Lucilla Lame, MD;  Location: [facility];  Service: Endoscopy;  Laterality:  N/A;   CORONARY ARTERY BYPASS GRAFT     ESOPHAGOGASTRODUODENOSCOPY (EGD) WITH PROPOFOL N/A 06/04/2020   Procedure: ESOPHAGOGASTRODUODENOSCOPY (EGD) WITH PROPOFOL;  Surgeon: Lucilla Lame, MD;  Location: [facility];  Service: Endoscopy;  Laterality: N/A;   VAGINAL HYSTERECTOMY     DUB    Social History   Tobacco Use   Smoking status: Never   Smokeless tobacco: Never  Vaping Use   Vaping Use: Never used  Substance Use  Topics   Alcohol use: No   Drug use: No     Medication list has been reviewed and updated.  Current Meds  Medication Sig   ALPRAZolam (XANAX) 0.25 MG tablet Take 1 tablet (0.25 mg total) by mouth daily as needed for anxiety.   atorvastatin (LIPITOR) 80 MG tablet TAKE (1) TABLET BY MOUTH EVERY DAY   ferrous sulfate 325 (65 FE) MG tablet Take 325 mg by mouth daily with breakfast.   fluconazole (DIFLUCAN) 150 MG tablet Take by mouth.   loperamide (IMODIUM) 2 MG capsule Take 2 mg by mouth as needed for diarrhea or loose stools.   loratadine (CLARITIN) 10 MG tablet Take 1 tablet (10 mg total) by mouth daily.   meloxicam (MOBIC) 7.5 MG tablet TAKE (1) TABLET BY MOUTH EVERY DAY   montelukast (SINGULAIR) 10 MG tablet Take 1 tablet (10 mg total) by mouth at bedtime.   nystatin (MYCOSTATIN) 100000 UNIT/ML suspension Take 5 mLs (500,000 Units total) by mouth 4 (four) times daily.   potassium chloride SA (KLOR-CON M) 20 MEQ tablet Take 1 tablet (20 mEq total) by mouth daily.   vitamin B-12 (CYANOCOBALAMIN) 1000 MCG tablet Take 1,000 mcg by mouth daily.       04/08/2021    2:06 PM 07/20/2020    2:04 PM 09/24/2019    9:46 AM 03/21/2019   10:12 AM  GAD 7 : Generalized Anxiety Score  Nervous, Anxious, on Edge 0 2 0 0  Control/stop worrying 0 2 0 0  Worry too much - different things 0 2 0 0  Trouble relaxing 0 0 0 0  Restless 0 0 0 0  Easily annoyed or irritable 0 1 0 0  Afraid - awful might happen 0 1 0 0  Total GAD 7 Score 0 8 0 0  Anxiety Difficulty Not difficult at all Not difficult at all         04/08/2021    2:06 PM 02/03/2021    3:04 PM 07/20/2020    2:03 PM  Depression screen PHQ 2/9  Decreased Interest 0 0 0  Down, Depressed, Hopeless 0 0 0  PHQ - 2 Score 0 0 0  Altered sleeping 0  1  Tired, decreased energy 0  1  Change in appetite 0  3  Feeling bad or failure about yourself  0  0  Trouble concentrating 0  0  Moving slowly or fidgety/restless 0  0  Suicidal thoughts 0  0   PHQ-9 Score 0  5  Difficult doing work/chores Not difficult at all  Very difficult    BP Readings from Last 3 Encounters:  10/08/21 112/72  09/30/21 120/80  06/17/21 136/69    Physical Exam Vitals and nursing note reviewed.  Constitutional:      General: She is not in acute distress.    Appearance: She is well-developed.  HENT:     Head: Normocephalic and atraumatic.  Cardiovascular:     Rate and Rhythm: Normal rate and regular rhythm.  Pulmonary:  Effort: Pulmonary effort is normal. No respiratory distress.     Breath sounds: No wheezing or rhonchi.  Abdominal:     General: Abdomen is flat.     Palpations: Abdomen is soft.     Tenderness: There is no abdominal tenderness. There is right CVA tenderness (mild right sided flank discomfort noted).  Musculoskeletal:     Cervical back: Normal range of motion.  Lymphadenopathy:     Cervical: No cervical adenopathy.  Skin:    General: Skin is warm and dry.     Findings: No rash.  Neurological:     Mental Status: She is alert and oriented to person, place, and time.  Psychiatric:        Mood and Affect: Mood normal.        Behavior: Behavior normal.     Wt Readings from Last 3 Encounters:  10/08/21 110 lb (49.9 kg)  09/30/21 110 lb (49.9 kg)  06/17/21 107 lb 3.2 oz (48.6 kg)    BP 112/72   Pulse 77   Ht 5' 4.5" (1.638 m)   Wt 110 lb (49.9 kg)   SpO2 97%   BMI 18.59 kg/m   Assessment and Plan: 1. Flank pain With infected appearing urine. Will send for culture before treating with additional antibiotics. Can take Tylenol, increase fluids and use heat - POC urinalysis w microscopic (non auto) - Urine Culture   Partially dictated using Editor, commissioning. Any errors are unintentional.  Halina Maidens, MD [facility]  10/08/2021

## 2021-10-11 LAB — URINE CULTURE

## 2021-10-12 ENCOUNTER — Ambulatory Visit (INDEPENDENT_AMBULATORY_CARE_PROVIDER_SITE_OTHER): Payer: Medicare PPO | Admitting: Family Medicine

## 2021-10-12 VITALS — BP 120/82 | HR 68 | Ht 64.5 in | Wt 110.0 lb

## 2021-10-12 DIAGNOSIS — N390 Urinary tract infection, site not specified: Secondary | ICD-10-CM | POA: Diagnosis not present

## 2021-10-12 DIAGNOSIS — R109 Unspecified abdominal pain: Secondary | ICD-10-CM

## 2021-10-12 DIAGNOSIS — N3001 Acute cystitis with hematuria: Secondary | ICD-10-CM | POA: Diagnosis not present

## 2021-10-12 DIAGNOSIS — R10A0 Flank pain, unspecified side: Secondary | ICD-10-CM

## 2021-10-12 DIAGNOSIS — N200 Calculus of kidney: Secondary | ICD-10-CM

## 2021-10-12 LAB — POCT URINALYSIS DIPSTICK
Bilirubin, UA: NEGATIVE
Glucose, UA: NEGATIVE
Ketones, UA: NEGATIVE
Nitrite, UA: NEGATIVE
Protein, UA: POSITIVE — AB
Spec Grav, UA: 1.02 (ref 1.010–1.025)
Urobilinogen, UA: 0.2 E.U./dL
pH, UA: 6 (ref 5.0–8.0)

## 2021-10-12 MED ORDER — CEPHALEXIN 500 MG PO CAPS: 500.0000 mg | ORAL_CAPSULE | Freq: Three times a day (TID) | ORAL | 0 refills | Status: DC

## 2021-10-12 NOTE — Progress Notes (Signed)
Date:  10/12/2021   Name:  Phyllis Ross   DOB:  06/16/1935   MRN:  [id number]   Chief Complaint: Urinary Tract Infection (Started hurting on R) side of back about 4-5 days after finishing antibiotic. Urine culture came back with mixed flora)  Urinary Tract Infection  This is a recurrent problem. The current episode started in the past 7 days. The problem occurs intermittently. The problem has been waxing and waning. The quality of the pain is described as burning. The pain is mild. Associated symptoms include flank pain, hematuria and hesitancy. Pertinent negatives include no frequency or urgency. The treatment provided moderate relief. Her past medical history is significant for recurrent UTIs.    Lab Results  Component Value Date   NA 139 06/17/2021   K 4.0 06/17/2021   CO2 28 06/17/2021   GLUCOSE 102 (H) 06/17/2021   BUN 24 (H) 06/17/2021   CREATININE 0.86 06/17/2021   CALCIUM 8.9 06/17/2021   GFRNONAA >60 06/17/2021   Lab Results  Component Value Date   CHOL 118 10/07/2020   HDL 43 10/07/2020   LDLCALC 53 10/07/2020   TRIG 109 10/07/2020   CHOLHDL 2.7 10/07/2020   No results found for: "TSH" Lab Results  Component Value Date   HGBA1C 5.5 04/07/2020   Lab Results  Component Value Date   WBC 8.0 06/17/2021   HGB 10.3 (L) 06/17/2021   HCT 33.6 (L) 06/17/2021   MCV 70.0 (L) 06/17/2021   PLT 284 06/17/2021   Lab Results  Component Value Date   ALT 78 (H) 06/17/2021   AST 44 (H) 06/17/2021   ALKPHOS 53 06/17/2021   BILITOT 1.4 (H) 06/17/2021   No results found for: "25OHVITD2", "25OHVITD3", "VD25OH"   Review of Systems  Cardiovascular:  Negative for chest pain and palpitations.  Gastrointestinal:  Negative for abdominal distention.  Genitourinary:  Positive for flank pain, hematuria and hesitancy. Negative for dysuria, frequency and urgency.  Musculoskeletal:  Positive for back pain.    Patient Active Problem List   Diagnosis Date Noted   B12  deficiency 09/16/2020   Goals of care, counseling/discussion 08/07/2020   Microcytic anemia 07/23/2020   Status post partial colectomy 07/02/2020   Protein-calorie malnutrition, severe 06/24/2020   Cecal cancer ([place]) 06/15/2020   Iron deficiency anemia due to chronic blood loss    Neoplasm of lower gastrointestinal tract    Acute gastritis with hemorrhage    Personal history of other malignant neoplasm of skin 01/21/2019   Atherosclerosis of abdominal aorta ([place]) 12/26/2017   Age-related osteoporosis without current pathological fracture 06/16/2016   Encounter for follow-up surveillance of ovarian cancer 05/20/2015   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   Wedging of vertebra ([place]) 10/13/2014   Polypharmacy 10/13/2014   Encounter for general adult medical examination without abnormal findings 10/13/2014   Anxiety 10/13/2014   Essential hypertension 08/27/2014   Bradycardia 08/27/2014   Coronary artery disease of native heart with stable angina pectoris ([place]) 08/27/2014   MI (mitral incompetence) 08/27/2014   TI (tricuspid incompetence) 08/27/2014   Combined fat and carbohydrate induced hyperlipemia 08/13/2014   HTN (hypertension), malignant 08/11/2014   Chest pain 08/11/2014   CAD (coronary artery disease) of artery bypass graft 08/11/2014    Allergies  Allergen Reactions   Trazodone And Nefazodone Other (See Comments)    Made her pass out    Past Surgical History:  Procedure Laterality Date   COLONOSCOPY  2011   normal- Dr Vira Agar  COLONOSCOPY WITH PROPOFOL N/A 06/04/2020   Procedure: COLONOSCOPY WITH PROPOFOL;  Surgeon: Lucilla Lame, MD;  Location: [facility];  Service: Endoscopy;  Laterality: N/A;   CORONARY ARTERY BYPASS GRAFT     ESOPHAGOGASTRODUODENOSCOPY (EGD) WITH PROPOFOL N/A 06/04/2020   Procedure: ESOPHAGOGASTRODUODENOSCOPY (EGD) WITH PROPOFOL;  Surgeon: Lucilla Lame, MD;  Location: [facility];  Service: Endoscopy;   Laterality: N/A;   VAGINAL HYSTERECTOMY     DUB    Social History   Tobacco Use   Smoking status: Never   Smokeless tobacco: Never  Vaping Use   Vaping Use: Never used  Substance Use Topics   Alcohol use: No   Drug use: No     Medication list has been reviewed and updated.  No outpatient medications have been marked as taking for the 10/12/21 encounter (Office Visit) with Juline Patch, MD.       04/08/2021    2:06 PM 07/20/2020    2:04 PM 09/24/2019    9:46 AM 03/21/2019   10:12 AM  GAD 7 : Generalized Anxiety Score  Nervous, Anxious, on Edge 0 2 0 0  Control/stop worrying 0 2 0 0  Worry too much - different things 0 2 0 0  Trouble relaxing 0 0 0 0  Restless 0 0 0 0  Easily annoyed or irritable 0 1 0 0  Afraid - awful might happen 0 1 0 0  Total GAD 7 Score 0 8 0 0  Anxiety Difficulty Not difficult at all Not difficult at all         04/08/2021    2:06 PM 02/03/2021    3:04 PM 07/20/2020    2:03 PM  Depression screen PHQ 2/9  Decreased Interest 0 0 0  Down, Depressed, Hopeless 0 0 0  PHQ - 2 Score 0 0 0  Altered sleeping 0  1  Tired, decreased energy 0  1  Change in appetite 0  3  Feeling bad or failure about yourself  0  0  Trouble concentrating 0  0  Moving slowly or fidgety/restless 0  0  Suicidal thoughts 0  0  PHQ-9 Score 0  5  Difficult doing work/chores Not difficult at all  Very difficult    BP Readings from Last 3 Encounters:  10/12/21 120/82  10/08/21 112/72  09/30/21 120/80    Physical Exam Vitals and nursing note reviewed. Exam conducted with a chaperone present.  Constitutional:      General: She is not in acute distress.    Appearance: She is not diaphoretic.  HENT:     Head: Normocephalic and atraumatic.     Right Ear: External ear normal.     Left Ear: External ear normal.     Nose: Nose normal.  Eyes:     General:        Right eye: No discharge.        Left eye: No discharge.     Conjunctiva/sclera: Conjunctivae normal.      Pupils: Pupils are equal, round, and reactive to light.  Neck:     Thyroid: No thyromegaly.     Vascular: No JVD.  Cardiovascular:     Rate and Rhythm: Normal rate and regular rhythm.     Heart sounds: Normal heart sounds. No murmur heard.    No friction rub. No gallop.  Pulmonary:     Effort: Pulmonary effort is normal.     Breath sounds: Normal breath sounds. No wheezing, rhonchi or rales.  Abdominal:     General: Bowel sounds are normal.     Palpations: Abdomen is soft. There is no mass.     Tenderness: There is no abdominal tenderness. There is no right CVA tenderness, left CVA tenderness or guarding.  Musculoskeletal:        General: Normal range of motion.     Cervical back: Normal range of motion and neck supple.  Lymphadenopathy:     Cervical: No cervical adenopathy.  Skin:    General: Skin is warm and dry.  Neurological:     Mental Status: She is alert.     Deep Tendon Reflexes: Reflexes are normal and symmetric.     Wt Readings from Last 3 Encounters:  10/12/21 110 lb (49.9 kg)  10/08/21 110 lb (49.9 kg)  09/30/21 110 lb (49.9 kg)    BP 120/82   Pulse 68   Ht 5' 4.5" (1.638 m)   Wt 110 lb (49.9 kg)   BMI 18.59 kg/m   Assessment and Plan: 1. Acute cystitis with hematuria New onset.  Persistent.  Patient continues to be symptomatic with flank pain but no dysuria urgency or frequency.  Urinalysis notes erythrocytes and leukocytes.  We will treat for UTI but for extended period time in case there is some involvement of the kidney.  It is noted that patient has been having increased episodes of these over the past 6 months.  Review of the pelvic CT earlier this year notes no abnormalities other than a dominant kidney stone in the right.  We will send urine culture since the last time was mixed flora to see if there is a more definite organism. - POCT urinalysis dipstick - cephALEXin (KEFLEX) 500 MG capsule; Take 1 capsule (500 mg total) by mouth 3 (three) times  daily.  Dispense: 30 capsule; Refill: 0 - Urine Culture  2. Recurrent UTI As noted above - cephALEXin (KEFLEX) 500 MG capsule; Take 1 capsule (500 mg total) by mouth 3 (three) times daily.  Dispense: 30 capsule; Refill: 0 - Ambulatory referral to Urology - Urine Culture  3. Flank pain New onset.  Episodic.  Recurrent.  Right flank pain little low for CVA that remains persistent.  Seems to improve on antibiotic.  We will resume cephalexin 500 mg 3 times a day for 10 days and refer to urology for evaluation of possible secondarily infected stone versus subacute pyelonephritis. - cephALEXin (KEFLEX) 500 MG capsule; Take 1 capsule (500 mg total) by mouth 3 (three) times daily.  Dispense: 30 capsule; Refill: 0 - Ambulatory referral to Urology  4. Nephrolithiasis As noted above there is a "dominant stone "of the right kidney and we will have this further evaluation of this could be a nidus of infection. - cephALEXin (KEFLEX) 500 MG capsule; Take 1 capsule (500 mg total) by mouth 3 (three) times daily.  Dispense: 30 capsule; Refill: 0 - Ambulatory referral to Urology

## 2021-10-14 LAB — URINE CULTURE: Organism ID, Bacteria: NO GROWTH

## 2021-10-19 ENCOUNTER — Other Ambulatory Visit: Payer: Self-pay | Admitting: Urology

## 2021-10-19 ENCOUNTER — Inpatient Hospital Stay: Payer: Medicare PPO | Attending: Oncology

## 2021-10-19 ENCOUNTER — Other Ambulatory Visit
Admission: RE | Admit: 2021-10-19 | Discharge: 2021-10-19 | Disposition: A | Discharge status: Home / Self Care | Payer: Medicare PPO | Source: Home / Self Care | Attending: Urology | Admitting: Urology

## 2021-10-19 ENCOUNTER — Encounter: Payer: Self-pay | Admitting: Oncology

## 2021-10-19 ENCOUNTER — Ambulatory Visit
Admission: RE | Admit: 2021-10-19 | Discharge: 2021-10-19 | Disposition: A | Discharge status: Home / Self Care | Payer: Medicare PPO | Attending: Urology | Admitting: Urology

## 2021-10-19 ENCOUNTER — Ambulatory Visit: Payer: Medicare PPO | Admitting: Urology

## 2021-10-19 ENCOUNTER — Inpatient Hospital Stay: Payer: Medicare PPO | Admitting: Oncology

## 2021-10-19 ENCOUNTER — Ambulatory Visit
Admission: RE | Admit: 2021-10-19 | Discharge: 2021-10-19 | Disposition: A | Discharge status: Home / Self Care | Payer: Medicare PPO | Source: Ambulatory Visit | Attending: Urology | Admitting: Urology

## 2021-10-19 ENCOUNTER — Telehealth: Payer: Self-pay

## 2021-10-19 ENCOUNTER — Other Ambulatory Visit: Payer: Self-pay | Admitting: *Deleted

## 2021-10-19 ENCOUNTER — Encounter: Payer: Self-pay | Admitting: Urology

## 2021-10-19 VITALS — BP 129/73 | HR 79 | Ht 65.0 in | Wt 110.0 lb

## 2021-10-19 VITALS — BP 126/66 | HR 62 | Temp 97.6°F | Resp 16 | Wt 110.1 lb

## 2021-10-19 DIAGNOSIS — N200 Calculus of kidney: Secondary | ICD-10-CM

## 2021-10-19 DIAGNOSIS — Z08 Encounter for follow-up examination after completed treatment for malignant neoplasm: Secondary | ICD-10-CM | POA: Diagnosis not present

## 2021-10-19 DIAGNOSIS — R109 Unspecified abdominal pain: Secondary | ICD-10-CM | POA: Diagnosis not present

## 2021-10-19 DIAGNOSIS — D561 Beta thalassemia: Secondary | ICD-10-CM | POA: Diagnosis not present

## 2021-10-19 DIAGNOSIS — E538 Deficiency of other specified B group vitamins: Secondary | ICD-10-CM | POA: Diagnosis not present

## 2021-10-19 DIAGNOSIS — Z85038 Personal history of other malignant neoplasm of large intestine: Secondary | ICD-10-CM | POA: Diagnosis not present

## 2021-10-19 DIAGNOSIS — N132 Hydronephrosis with renal and ureteral calculous obstruction: Secondary | ICD-10-CM

## 2021-10-19 DIAGNOSIS — D509 Iron deficiency anemia, unspecified: Secondary | ICD-10-CM

## 2021-10-19 DIAGNOSIS — Z8543 Personal history of malignant neoplasm of ovary: Secondary | ICD-10-CM | POA: Insufficient documentation

## 2021-10-19 LAB — COMPREHENSIVE METABOLIC PANEL
ALT: 54 U/L — ABNORMAL HIGH (ref 0–44)
AST: 31 U/L (ref 15–41)
Albumin: 3.5 g/dL (ref 3.5–5.0)
Alkaline Phosphatase: 56 U/L (ref 38–126)
Anion gap: 6 (ref 5–15)
BUN: 21 mg/dL (ref 8–23)
CO2: 27 mmol/L (ref 22–32)
Calcium: 9.4 mg/dL (ref 8.9–10.3)
Chloride: 106 mmol/L (ref 98–111)
Creatinine, Ser: 1.03 mg/dL — ABNORMAL HIGH (ref 0.44–1.00)
GFR, Estimated: 54 mL/min — ABNORMAL LOW (ref >60)
Glucose, Bld: 95 mg/dL (ref 70–99)
Potassium: 4.2 mmol/L (ref 3.5–5.1)
Sodium: 139 mmol/L (ref 135–145)
Total Bilirubin: 1.6 mg/dL — ABNORMAL HIGH (ref 0.3–1.2)
Total Protein: 6.7 g/dL (ref 6.5–8.1)

## 2021-10-19 LAB — CBC WITH DIFFERENTIAL/PLATELET
Abs Immature Granulocytes: 0.02 10*3/uL (ref 0.00–0.07)
Basophils Absolute: 0.1 10*3/uL (ref 0.0–0.1)
Basophils Relative: 1 %
Eosinophils Absolute: 0.2 10*3/uL (ref 0.0–0.5)
Eosinophils Relative: 2 %
HCT: 33.5 % — ABNORMAL LOW (ref 36.0–46.0)
Hemoglobin: 10.5 g/dL — ABNORMAL LOW (ref 12.0–15.0)
Immature Granulocytes: 0 %
Lymphocytes Relative: 26 %
Lymphs Abs: 2.1 10*3/uL (ref 0.7–4.0)
MCH: 21.8 pg — ABNORMAL LOW (ref 26.0–34.0)
MCHC: 31.3 g/dL (ref 30.0–36.0)
MCV: 69.6 fL — ABNORMAL LOW (ref 80.0–100.0)
Monocytes Absolute: 0.7 10*3/uL (ref 0.1–1.0)
Monocytes Relative: 8 %
Neutro Abs: 5.1 10*3/uL (ref 1.7–7.7)
Neutrophils Relative %: 63 %
Platelets: 221 10*3/uL (ref 150–400)
RBC: 4.81 MIL/uL (ref 3.87–5.11)
RDW: 15.2 % (ref 11.5–15.5)
WBC: 8.2 10*3/uL (ref 4.0–10.5)
nRBC: 0 % (ref 0.0–0.2)

## 2021-10-19 LAB — URINALYSIS, COMPLETE (UACMP) WITH MICROSCOPIC
Bilirubin Urine: NEGATIVE
Glucose, UA: NEGATIVE mg/dL
Ketones, ur: NEGATIVE mg/dL
Nitrite: NEGATIVE
Protein, ur: >300 mg/dL — AB
Specific Gravity, Urine: >1.03 — ABNORMAL HIGH (ref 1.005–1.030)
WBC, UA: >50 WBC/hpf (ref 0–5)
pH: 5.5 (ref 5.0–8.0)

## 2021-10-19 NOTE — Progress Notes (Unsigned)
Surgical Physician Order [facility]  * Scheduling expectation :  Friday 7/28  *Length of Case: 2 hours  *Clearance needed: no  *Anticoagulation Instructions: May continue all anticoagulants  *Aspirin Instructions: Ok to continue Aspirin  *Post-op visit Date/Instructions: TBD  *Diagnosis: Right Nephrolithiasis  *Procedure: right Ureteroscopy w/laser lithotripsy & stent placement (79390)   Additional orders: N/A  -Admit type: OUTpatient  -Anesthesia: General  -VTE Prophylaxis Standing Order SCD's       Other:   -Standing Lab Orders Per Anesthesia    Lab other: UA&Urine Culture (sent 7/18)  -Standing Test orders EKG/Chest x-ray per Anesthesia       Test other:   - Medications:  Cipro '400mg'$  IV  -Other orders:  N/A

## 2021-10-19 NOTE — Progress Notes (Signed)
10/19/21 12:48 PM   Phyllis Ross 16-Aug-1935 [id number]  CC: Right renal stone, "recurrent UTI"  HPI: I saw Ms. Heldman and her son today for the above issues.  She is an 86 year old female with a history of CAD, ovarian cancer, and colon cancer who presents with intermittent right-sided flank pain and concern for possible recurrent urinary infections.  She has had multiple CTs over the last few years that have showed a large 2 cm right renal pelvis stone.  She reportedly has had some right-sided flank pain over the last month and has been treated for possible infections with PCP, though culture has been negative, most recently 10/12/2021.  She denies any prior history of kidney stones.  She denies any gross hematuria, UTI symptoms, or fever.  She is currently on Keflex.  Urinalysis today 0-5 squamous cells, greater than 50 WBCs, 6-10 RBCs, many bacteria, calcium oxalate and uric acid crystals present.  Sent for culture.  Her son helps provide much of the history today.   PMH: Past Medical History:  Diagnosis Date   Anemia    Anxiety    Cancer ([place])    Hyperlipidemia    Hypertension    MI (myocardial infarction) ([place]) 2008   triple bypass   Osteoporosis    Pneumonia    as a baby   Rheumatic fever     Surgical History: Past Surgical History:  Procedure Laterality Date   COLONOSCOPY  2011   normal- Dr Vira Agar   COLONOSCOPY WITH PROPOFOL N/A 06/04/2020   Procedure: COLONOSCOPY WITH PROPOFOL;  Surgeon: Lucilla Lame, MD;  Location: [facility];  Service: Endoscopy;  Laterality: N/A;   CORONARY ARTERY BYPASS GRAFT     ESOPHAGOGASTRODUODENOSCOPY (EGD) WITH PROPOFOL N/A 06/04/2020   Procedure: ESOPHAGOGASTRODUODENOSCOPY (EGD) WITH PROPOFOL;  Surgeon: Lucilla Lame, MD;  Location: [facility];  Service: Endoscopy;  Laterality: N/A;   VAGINAL HYSTERECTOMY     DUB   Family History: Family History  Problem Relation Age of Onset   Heart attack Mother    Heart  attack Maternal Uncle    Heart attack Maternal Uncle     Social History:  reports that she has never smoked. She has never been exposed to tobacco smoke. She has never used smokeless tobacco. She reports that she does not drink alcohol and does not use drugs.  Physical Exam: BP 129/73   Pulse 79   Ht '5\' 5"'$  (1.651 m)   Wt 110 lb (49.9 kg)   BMI 18.30 kg/m    Constitutional:  Alert and oriented, No acute distress. Cardiovascular: No clubbing, cyanosis, or edema. Respiratory: Normal respiratory effort, no increased work of breathing. GI: Abdomen is soft, nontender, nondistended, no abdominal masses   Laboratory Data: Reviewed  Pertinent Imaging: I have personally viewed and interpreted the CT scans over the last few years showing a large 2 cm right renal pelvis stone, as well as the KUB today confirming large right renal pelvis stone.  Assessment & Plan:   86 year old female with 2 cm right renal pelvis stone likely causing intermittent obstruction with right-sided flank pain.  Recent urine culture negative, urinalysis today consistent with large stone and will send for culture.  No clinical signs of infection today that would warrant urgent stent placement.  Outside oncology and PCP notes were reviewed.  We discussed various treatment options for urolithiasis including observation with or without medical expulsive therapy, shockwave lithotripsy (SWL), ureteroscopy and laser lithotripsy with stent placement, and percutaneous nephrolithotomy.  We discussed that management is based on stone size, location, density, patient co-morbidities, and patient preference.   Stones <2m in size have a >80% spontaneous passage rate. Data surrounding the use of tamsulosin for medical expulsive therapy is controversial, but meta analyses suggests it is most efficacious for distal stones between 5-16min size. Possible side effects include dizziness/lightheadedness, and retrograde ejaculation.  SWL  has a lower stone free rate in a single procedure, but also a lower complication rate compared to ureteroscopy and avoids a stent and associated stent related symptoms. Possible complications include renal hematoma, steinstrasse, and need for additional treatment.  Ureteroscopy with laser lithotripsy and stent placement has a higher stone free rate than SWL in a single procedure, however increased complication rate including possible infection, ureteral injury, bleeding, and stent related morbidity. Common stent related symptoms include dysuria, urgency/frequency, and flank pain.  PCNL is the favored treatment for stones >2cm. It involves a small incision in the flank, with complete fragmentation of stones and removal. It has the highest stone free rate, but also the highest complication rate. Possible complications include bleeding, infection/sepsis, injury to surrounding organs including the pleura, and collecting system injury.   -We discussed that PCNL or ureteroscopy/possible staged ureteroscopy would be better options for her large right-sided renal stone.  Her stone is right on the border of considering PCNL versus ureteroscopic management.  With her age and comorbidities she is more interested in ureteroscopy.  After an extensive discussion of the risks and benefits of the above treatment options, the patient would like to proceed with right ureteroscopy, laser lithotripsy, stent placement.  She understands possible need for staged procedure based on the size of her stone.  Anticipate OR 10/29/21.  -Follow-up urine culture(currently on 10-day course of Keflex via PCP)   BrNickolas MadridMD 10/19/2021  [facility] [email or web address] [phone]

## 2021-10-19 NOTE — Patient Instructions (Signed)
Laser Therapy for Kidney Stones Laser therapy for kidney stones is a procedure to break up small, hard mineral deposits that form in the kidney (kidney stones). The procedure is done using a device that produces a focused beam of light (laser). The laser breaks up kidney stones into pieces that are small enough to be passed out of the body through urination or removed from the body during the procedure. You may need laser therapy if you have kidney stones that are painful or block your urinary tract. This procedure is done by inserting a tube (ureteroscope) into your kidney through the urethral opening. The urethra is the part of the body that drains urine from the bladder. In women, the urethra opens above the vaginal opening. In men, the urethra opens at the tip of the penis. The ureteroscope is inserted through the urethra, and surgical instruments are moved through the bladder and the muscular tube that connects the kidney to the bladder (ureter) until they reach the kidney. Tell a health care provider about: Any allergies you have. All medicines you are taking, including vitamins, herbs, eye drops, creams, and over-the-counter medicines. Any problems you or family members have had with anesthetic medicines. Any blood disorders you have. Any surgeries you have had. Any medical conditions you have. Whether you are pregnant or may be pregnant. What are the risks? Generally, this is a safe procedure. However, problems may occur, including: Infection. Bleeding. Allergic reactions to medicines. Damage to the urethra, bladder, or ureter. Urinary tract infection (UTI). Narrowing of the urethra (urethral stricture). Difficulty passing urine. Blockage of the kidney caused by a fragment of kidney stone. What happens before the procedure? Medicines Ask your health care provider about: Changing or stopping your regular medicines. This is especially important if you are taking diabetes medicines or  blood thinners. Taking medicines such as aspirin and ibuprofen. These medicines can thin your blood. Do not take these medicines unless your health care provider tells you to take them. Taking over-the-counter medicines, vitamins, herbs, and supplements. Eating and drinking Follow instructions from your health care provider about eating and drinking, which may include: 8 hours before the procedure - stop eating heavy meals or foods, such as meat, fried foods, or fatty foods. 6 hours before the procedure - stop eating light meals or foods, such as toast or cereal. 6 hours before the procedure - stop drinking milk or drinks that contain milk. 2 hours before the procedure - stop drinking clear liquids. Staying hydrated Follow instructions from your health care provider about hydration, which may include: Up to 2 hours before the procedure - you may continue to drink clear liquids, such as water, clear fruit juice, black coffee, and plain tea.  General instructions You may have a physical exam before the procedure. You may also have tests, such as imaging tests and blood or urine tests. If your ureter is too narrow, your health care provider may place a soft, flexible tube (stent) inside of it. The stent may be placed days or weeks before your laser therapy procedure. Plan to have someone take you home from the hospital or clinic. If you will be going home right after the procedure, plan to have someone stay with you for 24 hours. Do not use any products that contain nicotine or tobacco for at least 4 weeks before the procedure. These products include cigarettes, e-cigarettes, and chewing tobacco. If you need help quitting, ask your health care provider. Ask your health care provider: How your   surgical site will be marked or identified. What steps will be taken to help prevent infection. These may include: Removing hair at the surgery site. Washing skin with a germ-killing soap. Taking antibiotic  medicine. What happens during the procedure?  An IV will be inserted into one of your veins. You will be given one or more of the following: A medicine to help you relax (sedative). A medicine to numb the area (local anesthetic). A medicine to make you fall asleep (general anesthetic). A ureteroscope will be inserted into your urethra. The ureteroscope will send images to a video screen in the operating room to guide your surgeon to the area of your kidney that will be treated. A small, flexible tube will be threaded through the ureteroscope and into your bladder and ureter, up to your kidney. The laser device will be inserted into your kidney through the tube. Your surgeon will pulse the laser on and off to break up kidney stones. A surgical instrument that has a tiny wire basket may be inserted through the tube into your kidney to remove the pieces of broken kidney stone. The procedure may vary among health care providers and hospitals. What happens after the procedure? Your blood pressure, heart rate, breathing rate, and blood oxygen level will be monitored until you leave the hospital or clinic. You will be given pain medicine as needed. You may continue to receive antibiotics. You may have a stent temporarily placed in your ureter. Do not drive for 24 hours if you were given a sedative during your procedure. You may be given a strainer to collect any stone fragments that you pass in your urine. Your health care provider may have these tested. Summary Laser therapy for kidney stones is a procedure to break up kidney stones into pieces that are small enough to be passed out of the body through urination or removed during the procedure. Follow instructions from your health care provider about eating and drinking before the procedure. During the procedure, the ureteroscope will send images to a video screen to guide your surgeon to the area of your kidney that will be treated. Do not drive  for 24 hours if you were given a sedative during your procedure. This information is not intended to replace advice given to you by your health care provider. Make sure you discuss any questions you have with your health care provider. Document Revised: 11/23/2020 Document Reviewed: 11/23/2020 Elsevier Patient Education  [place] for Kidney Stones, Care After This sheet gives you information about how to care for yourself after your procedure. Your health care provider may also give you more specific instructions. If you have problems or questions, contact your health care provider. What can I expect after the procedure? After the procedure, it is common to have: Pain. A burning sensation while urinating. Small amounts of blood in your urine. A need to urinate frequently. Pieces of kidney stone in your urine. Mild discomfort when urinating that may be felt in the back. You may experience this if you have a flexible tube (stent) in your ureter. Follow these instructions at home:  Medicines Take over-the-counter and prescription medicines only as told by your health care provider. If you were prescribed an antibiotic medicine, take it as told by your health care provider. Do not stop taking the antibiotic even if you start to feel better. Ask your health care provider if the medicine prescribed to you: Requires you to avoid driving or using  heavy machinery. Can cause constipation. You may need to take actions to prevent or treat constipation, such as: Take over-the-counter or prescription medicines. Eat foods that are high in fiber, such as beans, whole grains, and fresh fruits and vegetables. Limit foods that are high in fat and processed sugars, such as fried or sweet foods. Activity Return to your normal activities as told by your health care provider. Ask your health care provider what activities are safe for you. Do not drive for 24 hours if you were given a  sedative during your procedure. General instructions If your health care provider approves, you may take a warm bath to ease discomfort and burning. Drink enough fluid to keep your urine pale yellow. Your health care provider may recommend drinking two 8 oz (237 mL) glasses of water per hour for a few hours after your procedure. You may be asked to strain your urine to collect any stone fragments that you pass. These fragments may be tested. Keep all follow-up visits as told by your health care provider. This is important. If you have a stent, you will need to return to your health care provider to have the stent removed. Contact a health care provider if you: Have pain or a burning feeling that lasts more than 2 days. Feel nauseous. Vomit more and more often. Have difficulty urinating. Have pain that gets worse or does not get better with medicine. Get help right away if: You are unable to urinate, even if your bladder feels full. You have: Bright red blood or blood clots in your urine. More blood in your urine. Severe pain or discomfort. A fever or shaking chills. Abdominal pain. Difficulty breathing. Swelling in your legs. Summary After the procedure, it is common to have a burning sensation while urinating and small amounts of blood in your urine. Take over-the-counter and prescription medicines only as told by your health care provider. Drink enough fluid to keep your urine pale yellow. Keep all follow-up visits as told by your health care provider. This is important. This information is not intended to replace advice given to you by your health care provider. Make sure you discuss any questions you have with your health care provider. Document Revised: 11/23/2020 Document Reviewed: 11/23/2020 Elsevier Patient Education  [place].  Ureteral Stent Implantation  Ureteral stent implantation is a procedure to insert (implant) a flexible, soft, plastic tube (stent) into a  ureter. Ureters are the tube-like parts of the body that drain urine from the kidneys. The stent supports the ureter while it heals and helps to drain urine. You may have a ureteral stent implanted after having a procedure to remove a blockage from the ureter (ureterolysis or pyeloplasty). You may also have a stent implanted to open the flow of urine when you have a blockage caused by a kidney stone, tumor, blood clot, or infection. You have two ureters, one on each side of the body. The ureters connect the kidneys to the organ that holds urine until it passes out of the body (bladder). The stent is placed so that one end is in the kidney, and one end is in the bladder. The stent is usually taken out after your ureter has healed. Depending on your condition, you may have a stent for just a few weeks, or you may have a long-term stent that will need to be replaced every few months. Tell a health care provider about: Any allergies you have. All medicines you are taking, including vitamins,  herbs, eye drops, creams, and over-the-counter medicines. Any problems you or family members have had with anesthetic medicines. Any blood disorders you have. Any surgeries you have had. Any medical conditions you have. Whether you are pregnant or may be pregnant. What are the risks? Generally, this is a safe procedure. However, problems may occur, including: Infection. Bleeding. Allergic reactions to medicines. Damage to other structures or organs. Tearing (perforation) of the ureter is possible. Movement of the stent away from where it is placed during surgery (migration). What happens before the procedure? Medicines Ask your health care provider about: Changing or stopping your regular medicines. This is especially important if you are taking diabetes medicines or blood thinners. Taking medicines such as aspirin and ibuprofen. These medicines can thin your blood. Do not take these medicines unless your health  care provider tells you to take them. Taking over-the-counter medicines, vitamins, herbs, and supplements. Eating and drinking Follow instructions from your health care provider about eating and drinking, which may include: 8 hours before the procedure - stop eating heavy meals or foods, such as meat, fried foods, or fatty foods. 6 hours before the procedure - stop eating light meals or foods, such as toast or cereal. 6 hours before the procedure - stop drinking milk or drinks that contain milk. 2 hours before the procedure - stop drinking clear liquids. Staying hydrated Follow instructions from your health care provider about hydration, which may include: Up to 2 hours before the procedure - you may continue to drink clear liquids, such as water, clear fruit juice, black coffee, and plain tea. General instructions Do not drink alcohol. Do not use any products that contain nicotine or tobacco for at least 4 weeks before the procedure. These products include cigarettes, e-cigarettes, and chewing tobacco. If you need help quitting, ask your health care provider. You may have an exam or testing, such as imaging or blood tests. Ask your health care provider what steps will be taken to help prevent infection. These may include: Removing hair at the surgery site. Washing skin with a germ-killing soap. Taking antibiotic medicine. Plan to have someone take you home from the hospital or clinic. If you will be going home right after the procedure, plan to have someone with you for 24 hours. What happens during the procedure? An IV will be inserted into one of your veins. You may be given a medicine to help you relax (sedative). You may be given a medicine to make you fall asleep (general anesthetic). A thin, tube-shaped instrument with a light and tiny camera at the end (cystoscope) will be inserted into your urethra. The urethra is the tube that drains urine from the bladder out of the body. In men,  the urethra opens at the end of the penis. In women, the urethra opens in front of the vaginal opening. The cystoscope will be passed into your bladder. A thin wire (guide wire) will be passed through your bladder and into your ureter. This is used to guide the stent into your ureter. The stent will be inserted into your ureter. The guide wire and the cystoscope will be removed. A flexible tube (catheter) may be inserted through your urethra so that one end is in your bladder. This helps to drain urine from your bladder. The procedure may vary among hospitals and health care providers. What happens after the procedure? Your blood pressure, heart rate, breathing rate, and blood oxygen level will be monitored until you leave the hospital or clinic.  You may continue to receive medicine and fluids through an IV. You may have some soreness or pain in your abdomen and urethra. Medicines will be available to help you. You will be encouraged to get up and walk around as soon as you can. You may have a catheter draining your urine. You will have some blood in your urine. Do not drive for 24 hours if you were given a sedative during your procedure. Summary Ureteral stent implantation is a procedure to insert a flexible, soft, plastic tube (stent) into a ureter. You may have a stent implanted to support the ureter while it heals after a procedure or to open the flow of urine if there is a blockage. Follow instructions from your health care provider about taking medicines and about eating and drinking before the procedure. Depending on your condition, you may have a stent for just a few weeks, or you may have a long-term stent that will need to be replaced every few months. This information is not intended to replace advice given to you by your health care provider. Make sure you discuss any questions you have with your health care provider. Document Revised: 02/15/2021 Document Reviewed:  01/25/2021 Elsevier Patient Education  2022 [employer].

## 2021-10-19 NOTE — Telephone Encounter (Signed)
Tried calling patient to set up surgery. Spoke with spouse. Will have patient call me directly.

## 2021-10-19 NOTE — Progress Notes (Signed)
Hematology/Oncology Consult note [facility]  [phone] [phone]  Patient Care Team: Juline Patch, MD as PCP - General (Family Medicine) Corey Skains, MD as Consulting Physician (Cardiology) Clent Jacks, RN as Oncology Nurse Navigator Ronny Bacon, MD as Consulting Physician (General Surgery) Sindy Guadeloupe, MD as Consulting Physician (Oncology)   Name of the patient: Corin Formisano  [id number]  Oct 25, 1935   Date of visit: 10/19/21  Diagnosis-stage III cecal adenocarcinoma currently in remission  Chief complaint/ Reason for visit-routine follow-up of colon cancer  Heme/Onc history: Patient is a 86 year old female who was 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  She is s/p laparoscopic right hemicolectomy.  Pathology showed 5.2 x 4.3 x 2.7 cm grade 2 moderately differentiated adenocarcinoma with invasion through muscularis propria into pericolorectal tissue.  7 of 27 lymph nodes positive.  Lymphovascular invasion present but no perineural invasion.  MMR negative.  [id number].  Right hemicolectomy complicated by anastomotic breakdown/leak which was treated with antibiotics.  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  Also has a history of baseline microcytic anemia secondary to beta thalassemia and baseline hemoglobin is between 10-11.   Patient has met with Dr. Mike Gip in the past management chemotherapy options including oral Xeloda has been offered to the patient   Patient completed 8 cycles of adjuvant Xeloda in November 2022.  Patient also has a history of B12 deficiency for which she is on monthly B12.      Interval history-overall patient is doing better.  Although she still reports some ongoing fatigue her performance status has improved since the last time I saw  her.  ECOG PS- 1 Pain scale- 0   Review of systems- Review of Systems  Constitutional:  Positive for malaise/fatigue. Negative for chills, fever and weight loss.  HENT:  Negative for congestion, ear discharge and nosebleeds.   Eyes:  Negative for blurred vision.  Respiratory:  Negative for cough, hemoptysis, sputum production, shortness of breath and wheezing.   Cardiovascular:  Negative for chest pain, palpitations, orthopnea and claudication.  Gastrointestinal:  Negative for abdominal pain, blood in stool, constipation, diarrhea, heartburn, melena, nausea and vomiting.  Genitourinary:  Negative for dysuria, flank pain, frequency, hematuria and urgency.  Musculoskeletal:  Negative for back pain, joint pain and myalgias.  Skin:  Negative for rash.  Neurological:  Negative for dizziness, tingling, focal weakness, seizures, weakness and headaches.  Endo/Heme/Allergies:  Does not bruise/bleed easily.  Psychiatric/Behavioral:  Negative for depression and suicidal ideas. The patient does not have insomnia.       Allergies  Allergen Reactions   Trazodone And Nefazodone Other (See Comments)    Made her pass out     Past Medical History:  Diagnosis Date   Anemia    Anxiety    Cancer ([place])    Hyperlipidemia    Hypertension    MI (myocardial infarction) ([place]) 2008   triple bypass   Osteoporosis    Pneumonia    as a baby   Rheumatic fever      Past Surgical History:  Procedure Laterality Date   COLONOSCOPY  2011   normal- Dr Vira Agar   COLONOSCOPY WITH PROPOFOL N/A 06/04/2020   Procedure: COLONOSCOPY WITH PROPOFOL;  Surgeon: Lucilla Lame, MD;  Location: [place];  Service: Endoscopy;  Laterality: N/A;   CORONARY ARTERY  BYPASS GRAFT     ESOPHAGOGASTRODUODENOSCOPY (EGD) WITH PROPOFOL N/A 06/04/2020   Procedure: ESOPHAGOGASTRODUODENOSCOPY (EGD) WITH PROPOFOL;  Surgeon: Lucilla Lame, MD;  Location: [place];  Service: Endoscopy;  Laterality: N/A;   VAGINAL  HYSTERECTOMY     DUB    Social History   Socioeconomic History   Marital status: Married    Spouse name: Not on file   Number of children: 2   Years of education: Not on file   Highest education level: Some college, no degree  Occupational History    Employer: RETIRED  Tobacco Use   Smoking status: Never    Passive exposure: Never   Smokeless tobacco: Never  Vaping Use   Vaping Use: Never used  Substance and Sexual Activity   Alcohol use: No   Drug use: No   Sexual activity: Yes  Other Topics Concern   Not on file  Social History Narrative   Not on file   Social Determinants of Health   Financial Resource Strain: Low Risk  (02/03/2021)   Overall Financial Resource Strain (CARDIA)    Difficulty of Paying Living Expenses: Not hard at all  Food Insecurity: No Food Insecurity (02/03/2021)   Hunger Vital Sign    Worried About Running Out of Food in the Last Year: Never true    Ran Out of Food in the Last Year: Never true  Transportation Needs: No Transportation Needs (02/03/2021)   PRAPARE - Hydrologist (Medical): No    Lack of Transportation (Non-Medical): No  Physical Activity: Insufficiently Active (02/03/2021)   Exercise Vital Sign    Days of Exercise per Week: 4 days    Minutes of Exercise per Session: 10 min  Stress: No Stress Concern Present (02/03/2021)   [place]    Feeling of Stress : Not at all  Social Connections: [place] (02/03/2021)   Social Connection and Isolation Panel [NHANES]    Frequency of Communication with Friends and Family: More than three times a week    Frequency of Social Gatherings with Friends and Family: More than three times a week    Attends Religious Services: More than 4 times per year    Active Member of [employer] or Organizations: Yes    Attends Music therapist: More than 4 times per year    Marital Status: Married   Human resources officer Violence: Not At Risk (02/03/2021)   Humiliation, Afraid, Rape, and Kick questionnaire    Fear of Current or Ex-Partner: No    Emotionally Abused: No    Physically Abused: No    Sexually Abused: No    Family History  Problem Relation Age of Onset   Heart attack Mother    Heart attack Maternal Uncle    Heart attack Maternal Uncle      Current Outpatient Medications:    atorvastatin (LIPITOR) 80 MG tablet, TAKE (1) TABLET BY MOUTH EVERY DAY, Disp: 90 tablet, Rfl: 0   cephALEXin (KEFLEX) 500 MG capsule, Take 1 capsule (500 mg total) by mouth 3 (three) times daily. (Patient taking differently: Take 500 mg by mouth 3 (three) times daily. RX FOR TEN DAYS.), Disp: 30 capsule, Rfl: 0   ferrous sulfate 325 (65 FE) MG tablet, Take 325 mg by mouth daily with breakfast., Disp: , Rfl:    meloxicam (MOBIC) 7.5 MG tablet, TAKE (1) TABLET BY MOUTH EVERY DAY, Disp: 30 tablet, Rfl: 2   potassium chloride  SA (KLOR-CON M) 20 MEQ tablet, Take 1 tablet (20 mEq total) by mouth daily., Disp: 30 tablet, Rfl: 2   vitamin B-12 (CYANOCOBALAMIN) 1000 MCG tablet, Take 1,000 mcg by mouth daily., Disp: , Rfl:    ALPRAZolam (XANAX) 0.25 MG tablet, Take 1 tablet (0.25 mg total) by mouth daily as needed for anxiety. (Patient not taking: Reported on 10/19/2021), Disp: 30 tablet, Rfl: 5   loperamide (IMODIUM) 2 MG capsule, Take 2 mg by mouth as needed for diarrhea or loose stools. (Patient not taking: Reported on 10/19/2021), Disp: , Rfl:    loratadine (CLARITIN) 10 MG tablet, Take 1 tablet (10 mg total) by mouth daily. (Patient not taking: Reported on 10/19/2021), Disp: 30 tablet, Rfl: 11   montelukast (SINGULAIR) 10 MG tablet, Take 1 tablet (10 mg total) by mouth at bedtime. (Patient not taking: Reported on 10/19/2021), Disp: 30 tablet, Rfl: 3   nystatin (MYCOSTATIN) 100000 UNIT/ML suspension, Take 5 mLs (500,000 Units total) by mouth 4 (four) times daily. (Patient not taking: Reported on 10/19/2021), Disp: 60  mL, Rfl: 0  Physical exam:  Vitals:   10/19/21 1322  BP: 126/66  Pulse: 62  Resp: 16  Temp: 97.6 F (36.4 C)  SpO2: 100%  Weight: 110 lb 1.6 oz (49.9 kg)   Physical Exam Constitutional:      Comments: Thin elderly female in no acute distress  Cardiovascular:     Rate and Rhythm: Normal rate and regular rhythm.     Heart sounds: Normal heart sounds.  Pulmonary:     Effort: Pulmonary effort is normal.     Breath sounds: Normal breath sounds.  Abdominal:     General: Bowel sounds are normal.     Palpations: Abdomen is soft.  Skin:    General: Skin is warm and dry.  Neurological:     Mental Status: She is alert and oriented to person, place, and time.         Latest Ref Rng & Units 10/19/2021    1:05 PM  CMP  Glucose 70 - 99 mg/dL 95   BUN 8 - 23 mg/dL 21   Creatinine 0.44 - 1.00 mg/dL 1.03   Sodium 135 - 145 mmol/L 139   Potassium 3.5 - 5.1 mmol/L 4.2   Chloride 98 - 111 mmol/L 106   CO2 22 - 32 mmol/L 27   Calcium 8.9 - 10.3 mg/dL 9.4   Total Protein 6.5 - 8.1 g/dL 6.7   Total Bilirubin 0.3 - 1.2 mg/dL 1.6   Alkaline Phos 38 - 126 U/L 56   AST 15 - 41 U/L 31   ALT 0 - 44 U/L 54       Latest Ref Rng & Units 10/19/2021    1:05 PM  CBC  WBC 4.0 - 10.5 K/uL 8.2   Hemoglobin 12.0 - 15.0 g/dL 10.5   Hematocrit 36.0 - 46.0 % 33.5   Platelets 150 - 400 K/uL 221     No images are attached to the encounter.  DG Ribs Unilateral Left  Result Date: 10/01/2021 CLINICAL DATA:  Left-sided chest pain EXAM: LEFT RIBS - 2 VIEW COMPARISON:  03/08/2021 FINDINGS: Cardiac shadow is within normal limits. Postsurgical changes are seen. No acute rib abnormality is noted. No pneumothorax is seen. Apical scarring is noted bilaterally. IMPRESSION: No acute rib fracture noted. Electronically Signed   By: Inez Catalina M.D.   On: 10/01/2021 19:35     Assessment and plan- Patient is a 86 y.o. female with  history of stage IIIc adenocarcinoma of the colon s/p 6 cycles of adjuvant Xeloda  and this is a routine follow-up visit  Clinically patient is doing well with no concerning signs and symptoms from her recurrence based on today's exam.  CEA from today is pending.  She is due for surveillance scan.  Her last scan in December 2022 was normal.  Patient has ongoing issues with kidney stones and would like to get that sorted out before doing another scan.  We will therefore plan to get a scan sometime around November and I will see her thereafter.  Patient had a colonoscopy in March 2022 when she was diagnosed with colon cancer.  She has not had a colonoscopy a year from that point and I will reach out to Dr. Allen Norris about it.  Risks versus benefits of colonoscopy at her age need to be discussed.  At this time patient is willing to proceed with a colonoscopy if need be  History of B12 deficiency: She gets monthly B12 injections in [place]   Visit Diagnosis 1. Encounter for follow-up surveillance of colon cancer      Dr. Randa Evens, MD, MPH [facility] [phone] 10/19/2021 4:13 PM

## 2021-10-19 NOTE — H&P (View-Only) (Signed)
10/19/21 12:48 PM   Phyllis Ross [birth year] [id number]  CC: Right renal stone, "recurrent UTI"  HPI: I saw Phyllis Ross and her son today for the above issues.  She is an 86 year old female with a history of CAD, ovarian cancer, and colon cancer who presents with intermittent right-sided flank pain and concern for possible recurrent urinary infections.  She has had multiple CTs over the last few years that have showed a large 2 cm right renal pelvis stone.  She reportedly has had some right-sided flank pain over the last month and has been treated for possible infections with PCP, though culture has been negative, most recently 10/12/2021.  She denies any prior history of kidney stones.  She denies any gross hematuria, UTI symptoms, or fever.  She is currently on Keflex.  Urinalysis today 0-5 squamous cells, greater than 50 WBCs, 6-10 RBCs, many bacteria, calcium oxalate and uric acid crystals present.  Sent for culture.  Her son helps provide much of the history today.   PMH: Past Medical History:  Diagnosis Date   Anemia    Anxiety    Cancer ([place])    Hyperlipidemia    Hypertension    MI (myocardial infarction) ([place]) 2008   triple bypass   Osteoporosis    Pneumonia    as a baby   Rheumatic fever     Surgical History: Past Surgical History:  Procedure Laterality Date   COLONOSCOPY  2011   normal- Dr Vira Agar   COLONOSCOPY WITH PROPOFOL N/A 06/04/2020   Procedure: COLONOSCOPY WITH PROPOFOL;  Surgeon: Lucilla Lame, MD;  Location: [place];  Service: Endoscopy;  Laterality: N/A;   CORONARY ARTERY BYPASS GRAFT     ESOPHAGOGASTRODUODENOSCOPY (EGD) WITH PROPOFOL N/A 06/04/2020   Procedure: ESOPHAGOGASTRODUODENOSCOPY (EGD) WITH PROPOFOL;  Surgeon: Lucilla Lame, MD;  Location: [place];  Service: Endoscopy;  Laterality: N/A;   VAGINAL HYSTERECTOMY     DUB   Family History: Family History  Problem Relation Age of Onset   Heart attack Mother    Heart  attack Maternal Uncle    Heart attack Maternal Uncle     Social History:  reports that she has never smoked. She has never been exposed to tobacco smoke. She has never used smokeless tobacco. She reports that she does not drink alcohol and does not use drugs.  Physical Exam: BP 129/73   Pulse 79   Ht '5\' 5"'$  (1.651 m)   Wt 110 lb (49.9 kg)   BMI 18.30 kg/m    Constitutional:  Alert and oriented, No acute distress. Cardiovascular: No clubbing, cyanosis, or edema. Respiratory: Normal respiratory effort, no increased work of breathing. GI: Abdomen is soft, nontender, nondistended, no abdominal masses   Laboratory Data: Reviewed  Pertinent Imaging: I have personally viewed and interpreted the CT scans over the last few years showing a large 2 cm right renal pelvis stone, as well as the KUB today confirming large right renal pelvis stone.  Assessment & Plan:   86 year old female with 2 cm right renal pelvis stone likely causing intermittent obstruction with right-sided flank pain.  Recent urine culture negative, urinalysis today consistent with large stone and will send for culture.  No clinical signs of infection today that would warrant urgent stent placement.  Outside oncology and PCP notes were reviewed.  We discussed various treatment options for urolithiasis including observation with or without medical expulsive therapy, shockwave lithotripsy (SWL), ureteroscopy and laser lithotripsy with stent placement, and percutaneous nephrolithotomy.  We discussed that management is based on stone size, location, density, patient co-morbidities, and patient preference.   Stones <30m in size have a >80% spontaneous passage rate. Data surrounding the use of tamsulosin for medical expulsive therapy is controversial, but meta analyses suggests it is most efficacious for distal stones between 5-161min size. Possible side effects include dizziness/lightheadedness, and retrograde ejaculation.  SWL  has a lower stone free rate in a single procedure, but also a lower complication rate compared to ureteroscopy and avoids a stent and associated stent related symptoms. Possible complications include renal hematoma, steinstrasse, and need for additional treatment.  Ureteroscopy with laser lithotripsy and stent placement has a higher stone free rate than SWL in a single procedure, however increased complication rate including possible infection, ureteral injury, bleeding, and stent related morbidity. Common stent related symptoms include dysuria, urgency/frequency, and flank pain.  PCNL is the favored treatment for stones >2cm. It involves a small incision in the flank, with complete fragmentation of stones and removal. It has the highest stone free rate, but also the highest complication rate. Possible complications include bleeding, infection/sepsis, injury to surrounding organs including the pleura, and collecting system injury.   -We discussed that PCNL or ureteroscopy/possible staged ureteroscopy would be better options for her large right-sided renal stone.  Her stone is right on the border of considering PCNL versus ureteroscopic management.  With her age and comorbidities she is more interested in ureteroscopy.  After an extensive discussion of the risks and benefits of the above treatment options, the patient would like to proceed with right ureteroscopy, laser lithotripsy, stent placement.  She understands possible need for staged procedure based on the size of her stone.  Anticipate OR 10/29/21.  -Follow-up urine culture(currently on 10-day course of Keflex via PCP)   BrNickolas MadridMD 10/19/2021  [facility] [address] [phone]

## 2021-10-20 ENCOUNTER — Telehealth: Payer: Self-pay

## 2021-10-20 ENCOUNTER — Telehealth: Payer: Self-pay | Admitting: Gastroenterology

## 2021-10-20 LAB — URINE CULTURE: Culture: NO GROWTH

## 2021-10-20 NOTE — Telephone Encounter (Signed)
We have received a fax from [facility], Dr Janese Banks is wanting this patient to get a surveillance colonoscopy with Dr Allen Norris. Considering patients age I wanted to ask you and Dr Allen Norris so we can schedule accordingly.

## 2021-10-20 NOTE — Progress Notes (Signed)
[facility] Posting Form   Surgery Date/Time: Date: 10/29/2021  Surgeon: Dr. Nickolas Madrid, MD  Surgery Location: Day Surgery  Inpt ( No  )   Outpt (Yes)   Obs ( No  )   Diagnosis: N20.0 Right Nephrolithiasis  -CPT: 38381  Surgery: Right Ureteroscopy with laser lithotripsy and stent placement   Stop Anticoagulations: No, may continue all  Cardiac/Medical/Pulmonary Clearance needed: no  *Orders entered into EPIC  Date: 10/20/21   *Case booked in EPIC  Date: 10/20/21  *Notified pt of Surgery: Date: 10/20/21  PRE-OP UA & CX: yes, obtained in clinic on 10/19/2021  *Placed into Prior Authorization Work Fabio Bering Date: 10/20/21   Assistant/laser/rep:No

## 2021-10-20 NOTE — Telephone Encounter (Signed)
I spoke with Phyllis Ross and her son. We have discussed possible surgery dates and Friday July 28th, 2023 was agreed upon by all parties. Patient given information about surgery date, what to expect pre-operatively and post operatively.  We discussed that a Pre-Admission Testing office will be calling to set up the pre-op visit that will take place prior to surgery, and that these appointments are typically done over the phone with a Pre-Admissions RN.  Informed patient that our office will communicate any additional care to be provided after surgery. Patients questions or concerns were discussed during our call. Advised to call our office should there be any additional information, questions or concerns that arise. Patient verbalized understanding.

## 2021-10-21 ENCOUNTER — Ambulatory Visit: Payer: Medicare PPO | Admitting: Family Medicine

## 2021-10-21 ENCOUNTER — Ambulatory Visit (INDEPENDENT_AMBULATORY_CARE_PROVIDER_SITE_OTHER): Payer: Medicare PPO

## 2021-10-21 DIAGNOSIS — E538 Deficiency of other specified B group vitamins: Secondary | ICD-10-CM | POA: Diagnosis not present

## 2021-10-21 MED ORDER — CYANOCOBALAMIN 1000 MCG/ML IJ SOLN
1000.0000 ug | Freq: Once | INTRAMUSCULAR | Status: AC
  Administered 2021-10-21: 1000 ug via INTRAMUSCULAR

## 2021-10-25 ENCOUNTER — Encounter
Admission: RE | Admit: 2021-10-25 | Discharge: 2021-10-25 | Disposition: A | Discharge status: Home / Self Care | Payer: Medicare PPO | Source: Ambulatory Visit | Attending: Urology | Admitting: Urology

## 2021-10-25 ENCOUNTER — Telehealth: Payer: Self-pay

## 2021-10-25 ENCOUNTER — Other Ambulatory Visit: Payer: Self-pay

## 2021-10-25 DIAGNOSIS — I25708 Atherosclerosis of coronary artery bypass graft(s), unspecified, with other forms of angina pectoris: Secondary | ICD-10-CM

## 2021-10-25 HISTORY — DX: Personal history of urinary calculi: Z87.442

## 2021-10-25 HISTORY — DX: Oophoritis, unspecified: N70.92

## 2021-10-25 NOTE — Patient Instructions (Addendum)
Your procedure is scheduled on: 10/29/2021  Report to the Registration Desk on the 1st floor of the [facility]. To find out your arrival time, please call [phone] between 1PM - 3PM on: 7/27/ 2023  If your arrival time is 6:00 am, do not arrive prior to that time as the [facility] entrance doors do not open until 6:00 am.  REMEMBER: Instructions that are not followed completely may result in serious medical risk, up to and including death; or upon the discretion of your surgeon and anesthesiologist your surgery may need to be rescheduled.  Do not eat food after midnight the night before surgery.  No gum chewing, lozengers or hard candies.  TAKE THESE MEDICATIONS THE MORNING OF SURGERY WITH A SIP OF WATER: Lipitor  potassium chloride     One week prior to surgery: Stop Anti-inflammatories (NSAIDS) such as Advil, Aleve, Ibuprofen, Motrin, Naproxen, Naprosyn and Aspirin based products such as Excedrin, Goodys Powder, BC Powder and mobic (meloxicam) Stop ANY OVER THE COUNTER supplements until after surgery Ferrus sulfate, You may however, continue to take Tylenol if needed for pain up until the day of surgery.  No Alcohol for 24 hours before or after surgery.  No Smoking including e-cigarettes for 24 hours prior to surgery.  No chewable tobacco products for at least 6 hours prior to surgery.  No nicotine patches on the day of surgery.  Do not use any "recreational" drugs for at least a week prior to your surgery.  Please be advised that the combination of cocaine and anesthesia may have negative outcomes, up to and including death. If you test positive for cocaine, your surgery will be cancelled.  On the morning of surgery brush your teeth with toothpaste and water, you may rinse your mouth with mouthwash if you wish. Do not swallow any toothpaste or mouthwash.  You need to shower the day of surgery.  Do not wear jewelry, make-up, hairpins, clips or nail polish.  Do not  wear lotions, powders, or perfumes.   Do not shave body from the neck down 48 hours prior to surgery just in case you cut yourself which could leave a site for infection.  Also, freshly shaved skin may become irritated if using the CHG soap.  Contact lenses, hearing aids and dentures may not be worn into surgery.  Do not bring valuables to the hospital. [facility] is not responsible for any missing/lost belongings or valuables.   Notify your doctor if there is any change in your medical condition (cold, fever, infection).  Wear comfortable clothing (specific to your surgery type) to the hospital.  After surgery, you can help prevent lung complications by doing breathing exercises.  Take deep breaths and cough every 1-2 hours. Your doctor may order a device called an Incentive Spirometer to help you take deep breaths. If you are being admitted to the hospital overnight, leave your suitcase in the car. After surgery it may be brought to your room.  If you are being discharged the day of surgery, you will not be allowed to drive home. You will need a responsible adult (18 years or older) to drive you home and stay with you that night.   If you are taking public transportation, you will need to have a responsible adult (18 years or older) with you. Please confirm with your physician that it is acceptable to use public transportation.   Please call the [facility] Dept. at [phone] if you have any questions about  these instructions.  Surgery Visitation Policy:  Patients undergoing a surgery or procedure may have two family members or support persons with them as long as the person is not COVID-19 positive or experiencing its symptoms.   Inpatient Visitation:    Visiting hours are 7 a.m. to 8 p.m. Up to four visitors are allowed at one time in a patient room, including children. The visitors may rotate out with other people during the day. One designated support person  (adult) may remain overnight.

## 2021-10-25 NOTE — Telephone Encounter (Signed)
Contacted patient to schedule screening colonoscopy.  She stated that she would like to postpone scheduling until after she has her kidney stones removed 10/29/21.  She will call back next week to schedule.  Thanks, [place], [place]

## 2021-10-26 ENCOUNTER — Encounter: Payer: Self-pay | Admitting: Urgent Care

## 2021-10-26 ENCOUNTER — Encounter
Admission: RE | Admit: 2021-10-26 | Discharge: 2021-10-26 | Disposition: A | Discharge status: Home / Self Care | Payer: Medicare PPO | Source: Ambulatory Visit | Attending: Urology | Admitting: Urology

## 2021-10-26 ENCOUNTER — Inpatient Hospital Stay: Admission: RE | Admit: 2021-10-26 | Payer: Medicare PPO | Source: Ambulatory Visit

## 2021-10-26 ENCOUNTER — Encounter: Payer: Self-pay | Admitting: Urology

## 2021-10-26 DIAGNOSIS — I25708 Atherosclerosis of coronary artery bypass graft(s), unspecified, with other forms of angina pectoris: Secondary | ICD-10-CM | POA: Insufficient documentation

## 2021-10-26 DIAGNOSIS — Z01810 Encounter for preprocedural cardiovascular examination: Secondary | ICD-10-CM | POA: Insufficient documentation

## 2021-10-26 DIAGNOSIS — Z01818 Encounter for other preprocedural examination: Secondary | ICD-10-CM | POA: Diagnosis not present

## 2021-10-26 NOTE — Progress Notes (Signed)
Perioperative Services  Pre-Admission/Anesthesia Testing Clinical Review  Date: 10/27/21  Patient Demographics:  Name: Phyllis Ross DOB:   1935/11/22 MRN:   [id number]  Planned Surgical Procedure(s):    Case: [id number] Date/Time: 10/29/21 0846   Procedure: CYSTOSCOPY/URETEROSCOPY/HOLMIUM LASER/STENT PLACEMENT (Right)   Anesthesia type: General   Pre-op diagnosis: Right Nephrolithiasis   Location: [facility] OR ROOM 10 / [place] [facility] FOR ANESTHESIA GROUP   Surgeons: Billey Co, MD   NOTE: Available PAT nursing documentation and vital signs have been reviewed. Clinical nursing staff has updated patient's PMH/PSHx, current medication list, and drug allergies/intolerances to ensure comprehensive history available to assist in medical decision making as it pertains to the aforementioned surgical procedure and anticipated anesthetic course. Extensive review of available clinical information performed. Phyllis Ross PMH and PSHx updated with any diagnoses/procedures that  may have been inadvertently omitted during her intake with the pre-admission testing department's nursing staff.  Clinical Discussion:  Phyllis Ross is a 86 y.o. female who is submitted for pre-surgical anesthesia review and clearance prior to her undergoing the above procedure. Patient has never been a smoker. Pertinent PMH includes: CAD (s/p CABG), STEMI, bradycardia, aortic atherosclerosis, HTN, HLD, microcytic anemia secondary to beta thalassemia, colon cancer, ovarian cancer, anxiety (on BZO).   Patient is followed by cardiology Nehemiah Massed, MD). She was last seen in the cardiology clinic on 02/23/2021; notes reviewed. At the time of her clinic visit, the patient denied any chest pain, shortness of breath, PND, orthopnea, palpitations, significant peripheral edema, vertiginous symptoms, or presyncope/syncope.  Patient with a PMH significant for cardiovascular diagnoses.    Patient suffered a STEMI on 08/14/2006.  She  initially presented for care here at [facility].  Diagnostic left heart catheterization was performed by Dr. Katrine Coho, MD revealing mildly reduced left ventricular systolic function with an EF of 45%.  There was multivessel CAD; 95% mid LAD, 95% OM1, and 75% left ventricular groove artery.  Complex disease not felt to be amenable to PCI.  Patient transferred to [facility] for emergent CVTS consultation for CABG.  Patient underwent three-vessel CABG procedure on 08/14/2006 at [facility] with Dr. Walker Shadow, MD.  LIMA-LAD, SVG-RI, and SVG-OM1 bypass grafts were placed.  Myocardial perfusion imaging study was performed on 08/27/2014 revealing a normal left ventricular systolic function with an EF of 60%.  There were no regional wall motion abnormalities.  There was no evidence of stress-induced myocardial ischemia or arrhythmia; no scintigraphic evidence of scar.  Patient with bradycardia at rest and with chronotropic incompetence.  Study determined to be normal.  Last TTE was performed on 02/08/2021 revealing normal left ventricular systolic function with an EF >55%.  There were no regional wall motion abnormalities.  Aortic valve sclerotic.  There was mild mitral annular calcification.  There was mild mitral/pulmonary and moderate tricuspid valve regurgitation.  There was no evidence of a significant transvalvular gradient to suggest stenosis.  Blood pressure well controlled at 120/50 without the use of pharmacological intervention.  Patient is on a statin for her HLD diagnosis and further ASCVD prevention.  She is not diabetic. Functional capacity, as defined by DASI, is documented as being </= 4 METS.  No changes were made to her medication regimen.  Patient to follow-up with outpatient cardiology in 9 months or sooner if needed.  Phyllis Ross is scheduled for a CYSTOSCOPY/URETEROSCOPY/HOLMIUM LASER/STENT PLACEMENT (Right) on 10/29/2021 with Dr. Nickolas Madrid, MD.  Given  patient's past medical history significant for cardiovascular diagnoses,  presurgical cardiac clearance was sought by the PAT team. "The patient is at the lowest risk possible for perioperative cardiovascular complications with the planned procedure.  The overall risk her procedure is low (<1%).  Currently has no evidence active and/or significant angina and/or congestive heart failure. Patient may proceed to surgery without restriction or need for further cardiovascular testing at an overall LOW risk".  In review of her medication reconciliation, the patient is not noted to be taking any type of anticoagulation or antiplatelet therapies that would need to be held during her perioperative course.  Patient denies previous perioperative complications with anesthesia in the past. In review of the available records, it is noted that patient underwent a general anesthetic course here at [facility] ([facility]) in 06/2020 without documented complications.      10/27/2021   11:31 AM 10/19/2021    1:22 PM 10/19/2021   10:42 AM  Vitals with BMI  Height '5\' 5"'$   '5\' 5"'$   Weight 109 lbs 110 lbs 2 oz 110 lbs  BMI 18.14 50.09 38.18  Systolic 299 371 696  Diastolic 80 66 73  Pulse 76 62 79    Providers/Specialists:   NOTE: Primary physician provider listed below. Patient may have been seen by APP or partner within same practice.   PROVIDER ROLE / SPECIALTY LAST Ranae Pila, MD Urology (Surgeon) 10/19/2021  Juline Patch, MD Primary Care Provider 10/12/2021  Serafina Royals, MD Cardiology 02/23/2021  Randa Evens, MD Medical Oncology /HEMATOLOGY 10/19/2021   Allergies:  Trazodone and nefazodone  Current Home Medications:   No current facility-administered medications for this encounter.    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   a.) stage IIIc (G2, (+) LVI, (-) PNI, T3N3BMX) --> s/p RIGHT hemicolectomy + oral capecitabine   Anemia    Anxiety    a.) on BZO (alprazolam) PRN   Aortic atherosclerosis (HCC)    Beta thalassemia (HCC)    Bradycardia    CAD (coronary artery disease) 08/14/2006   a.) STEMI 08/14/2006 --> LHC at [facility] --> EF 45%, 95% mLAD, 95% OM1, and 75% LV groove artery --> transfer to [facility] for CVTS consult; b.) 3v CABG 08/14/2006   History of kidney stones    Hyperlipidemia    Hypertension    Osteoporosis    Ovarian abscess    Ovarian cancer ([place]) 2005   a.) stage IIIb adenocarcinoma --> s/p CRS 2005 + 6 cycles of carboplatin/paclitaxel   Pneumonia    as a baby   Rheumatic fever    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   a.) LIMA-LAD, SVG-RI, SVG-OM1   Skin cancer    STEMI (ST elevation myocardial infarction) ([place]) 08/14/2006   a.) LHC at [facility] --> transferred to [facility] for emergent CABG   Past Surgical History:  Procedure Laterality Date   COLONOSCOPY  04/04/2009   normal- Dr Vira Agar   COLONOSCOPY WITH PROPOFOL N/A 06/04/2020   Procedure: COLONOSCOPY WITH PROPOFOL;  Surgeon: Lucilla Lame, MD;  Location: [facility];  Service: Endoscopy;  Laterality: N/A;   CORONARY ARTERY BYPASS GRAFT N/A 08/14/2006   Procedure: CORONARY ARTERY BYPASS GRAFT; Location: [facility]; Surgeon: Walker Shadow, MD   ESOPHAGOGASTRODUODENOSCOPY (EGD) WITH PROPOFOL N/A 06/04/2020   Procedure: ESOPHAGOGASTRODUODENOSCOPY (EGD) WITH PROPOFOL;  Surgeon: Lucilla Lame, MD;  Location: [facility];  Service: Endoscopy;  Laterality: N/A;   FRACTURE SURGERY     LEFT HEART CATH AND CORONARY ANGIOGRAPHY Left 08/14/2006   Procedure: LEFT HEART CATHETERIZATION AND CORONARY ANGIOGRAPHY; Location: [facility]: Surgeon: Katrine Coho, MD   VAGINAL HYSTERECTOMY     DUB   Family History  Problem Relation Age of Onset   Heart attack Mother    Heart attack Maternal Uncle    Heart attack Maternal Uncle    Social History   Tobacco Use   Smoking status: Never    Passive exposure: Never   Smokeless tobacco: Never  Vaping Use   Vaping Use: Never used  Substance Use Topics   Alcohol use: No   Drug use: No    Pertinent Clinical Results:  LABS: Labs reviewed: Acceptable for surgery.  Office Visit on 10/27/2021  Component Date Value Ref Range Status   Color, UA 10/27/2021 yellow   Final   Clarity, UA 10/27/2021 clear   Final   Glucose, UA 10/27/2021 Negative  Negative Final   Bilirubin, UA 10/27/2021 negative   Final   Ketones, UA 10/27/2021 negative   Final   Spec Grav, UA 10/27/2021 1.010  1.010 - 1.025 Final   Blood, UA 10/27/2021 large   Final   3+   pH, UA 10/27/2021 7.0  5.0 - 8.0 Final   Protein, UA 10/27/2021 Positive (A)  Negative Final   3+   Urobilinogen, UA 10/27/2021 0.2  0.2 or 1.0 E.U./dL Final   Nitrite, UA 10/27/2021 negative   Final   Leukocytes, UA 10/27/2021 Moderate (2+) (A)  Negative Final   Appearance 10/27/2021 clear   Final   Odor 10/27/2021 none   Final    ECG: Date: 10/27/2021 Time ECG obtained: 1210 PM Rate: 66 bpm Rhythm: normal sinus Axis (leads I and aVF): Normal Intervals: PR 206 ms. QRS 92 ms. QTc 465 ms. ST segment and T wave changes: Nonspecific ST abnormalities  Comparison: Similar to previous tracing obtained on 06/12/2020   IMAGING / PROCEDURES: DG ABD 1 VIEW performed on 10/19/2021 Nonspecific bowel gas pattern Large 2.6 x 1.7 cm calculus in the medial midportion of the RIGHT kidney Degenerative changes noted in the lumbar spine  and both hips  CT CHEST ABDOMEN PELVIS W CONTRAST performed on 03/08/2021 Similar nonspecific right lower lobe pulmonary nodule over 5 months. Stability favors a benign etiology with primary malignancy or metastatic disease felt less likely. Recommend attention on follow-up. Otherwise, no evidence of metastatic disease within the chest. Mild interstitial lung disease, possibly nonspecific interstitial pneumonitis. Aortic atherosclerosis  Status post right hemicolectomy, without recurrent or metastatic disease. Dominant right renal pelvic stone with mild secondary caliectasis. Other smaller right renal collecting system calculi.  TRANSTHORACIC ECHOCARDIOGRAM performed on 02/08/2021 Normal left ventricular systolic function with an EF of >55% No regional wall motion abnormalities Mild MR and PR Moderate TR Aortic valve sclerosis Normal gradients; no valvular stenosis No pericardial effusion  MYOCARDIAL PERFUSION IMAGING STUDY (LEXISCAN) performed on 08/27/2014 Normal left ventricular systolic function with an  EF of 60% Normal myocardial thickening and wall motion No evidence of stress-induced myocardial ischemia or arrhythmia; no scintigraphic evidence of scar Artifact noted Left ventricular cavity size normal Bradycardia at rest with some chronotropic incompetence  Impression and Plan:  Phyllis Ross has been referred for pre-anesthesia review and clearance prior to her undergoing the planned anesthetic and procedural courses. Available labs, pertinent testing, and imaging results were personally reviewed by me. This patient has been appropriately cleared by cardiology with an overall LOW risk of significant perioperative cardiovascular complications.  Based on clinical review performed today (10/27/21), barring any significant acute changes in the patient's overall condition, it is anticipated that she will be able to proceed with the planned surgical intervention. Any acute changes  in clinical condition may necessitate her procedure being postponed and/or cancelled. Patient will meet with anesthesia team (MD and/or CRNA) on the day of her procedure for preoperative evaluation/assessment. Questions regarding anesthetic course will be fielded at that time.   Pre-surgical instructions were reviewed with the patient during her PAT appointment and questions were fielded by PAT clinical staff. Patient was advised that if any questions or concerns arise prior to her procedure then she should return a call to PAT and/or her surgeon's office to discuss.  Honor Loh, MSN, APRN, FNP-C, CEN [facility]  Peri-operative Services Nurse Practitioner Phone: [phone] Fax: [phone] 10/27/21 4:00 PM  NOTE: This note has been prepared using Dragon dictation software. Despite my best ability to proofread, there is always the potential that unintentional transcriptional errors may still occur from this process.

## 2021-10-27 ENCOUNTER — Encounter: Payer: Self-pay | Admitting: Family Medicine

## 2021-10-27 ENCOUNTER — Ambulatory Visit (INDEPENDENT_AMBULATORY_CARE_PROVIDER_SITE_OTHER): Payer: Medicare PPO | Admitting: Family Medicine

## 2021-10-27 ENCOUNTER — Ambulatory Visit: Payer: Self-pay | Admitting: *Deleted

## 2021-10-27 ENCOUNTER — Encounter: Payer: Self-pay | Admitting: Urology

## 2021-10-27 VITALS — BP 130/80 | HR 76 | Ht 65.0 in | Wt 109.0 lb

## 2021-10-27 DIAGNOSIS — R300 Dysuria: Secondary | ICD-10-CM

## 2021-10-27 LAB — POCT URINALYSIS DIPSTICK
Bilirubin, UA: NEGATIVE
Glucose, UA: NEGATIVE
Ketones, UA: NEGATIVE
Nitrite, UA: NEGATIVE
Protein, UA: POSITIVE — AB
Spec Grav, UA: 1.01 (ref 1.010–1.025)
Urobilinogen, UA: 0.2 E.U./dL
pH, UA: 7 (ref 5.0–8.0)

## 2021-10-27 MED ORDER — FLUCONAZOLE 150 MG PO TABS: 150.0000 mg | ORAL_TABLET | Freq: Once | ORAL | 0 refills | Status: AC

## 2021-10-27 MED ORDER — CEPHALEXIN 500 MG PO CAPS: 500.0000 mg | ORAL_CAPSULE | Freq: Two times a day (BID) | ORAL | 0 refills | Status: DC

## 2021-10-27 NOTE — Progress Notes (Unsigned)
Date:  10/27/2021   Name:  Phyllis Ross   DOB:  01/31/1936   MRN:  [id number]   Chief Complaint: Urinary Tract Infection (Burning when urinating)  Urinary Tract Infection  This is a new problem. The current episode started in the past 7 days. The problem has been gradually worsening. The quality of the pain is described as burning. There has been no fever. Pertinent negatives include no chills, flank pain, frequency, hematuria or urgency. She has tried nothing for the symptoms.    Lab Results  Component Value Date   NA 139 10/19/2021   K 4.2 10/19/2021   CO2 27 10/19/2021   GLUCOSE 95 10/19/2021   BUN 21 10/19/2021   CREATININE 1.03 (H) 10/19/2021   CALCIUM 9.4 10/19/2021   GFRNONAA 54 (L) 10/19/2021   Lab Results  Component Value Date   CHOL 118 10/07/2020   HDL 43 10/07/2020   LDLCALC 53 10/07/2020   TRIG 109 10/07/2020   CHOLHDL 2.7 10/07/2020   No results found for: "TSH" Lab Results  Component Value Date   HGBA1C 5.5 04/07/2020   Lab Results  Component Value Date   WBC 8.2 10/19/2021   HGB 10.5 (L) 10/19/2021   HCT 33.5 (L) 10/19/2021   MCV 69.6 (L) 10/19/2021   PLT 221 10/19/2021   Lab Results  Component Value Date   ALT 54 (H) 10/19/2021   AST 31 10/19/2021   ALKPHOS 56 10/19/2021   BILITOT 1.6 (H) 10/19/2021   No results found for: "25OHVITD2", "25OHVITD3", "VD25OH"   Review of Systems  Constitutional:  Negative for chills.  Respiratory:  Negative for chest tightness, shortness of breath and wheezing.   Cardiovascular:  Negative for chest pain and palpitations.  Gastrointestinal:  Negative for abdominal pain.  Genitourinary:  Positive for dysuria. Negative for difficulty urinating, flank pain, frequency, hematuria, pelvic pain and urgency.    Patient Active Problem List   Diagnosis Date Noted   B12 deficiency 09/16/2020   Goals of care, counseling/discussion 08/07/2020   Microcytic anemia 07/23/2020   Status post partial colectomy  07/02/2020   Protein-calorie malnutrition, severe 06/24/2020   Cecal cancer ([place]) 06/15/2020   Iron deficiency anemia due to chronic blood loss    Neoplasm of lower gastrointestinal tract    Acute gastritis with hemorrhage    Personal history of other malignant neoplasm of skin 01/21/2019   Atherosclerosis of abdominal aorta ([place]) 12/26/2017   Age-related osteoporosis without current pathological fracture 06/16/2016   Encounter for follow-up surveillance of ovarian cancer 05/20/2015   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   Wedging of vertebra ([place]) 10/13/2014   Polypharmacy 10/13/2014   Encounter for general adult medical examination without abnormal findings 10/13/2014   Anxiety 10/13/2014   Essential hypertension 08/27/2014   Bradycardia 08/27/2014   Coronary artery disease of native heart with stable angina pectoris ([place]) 08/27/2014   MI (mitral incompetence) 08/27/2014   TI (tricuspid incompetence) 08/27/2014   Combined fat and carbohydrate induced hyperlipemia 08/13/2014   HTN (hypertension), malignant 08/11/2014   Chest pain 08/11/2014   CAD (coronary artery disease) of artery bypass graft 08/11/2014    Allergies  Allergen Reactions   Trazodone And Nefazodone Other (See Comments)    Made her pass out    Past Surgical History:  Procedure Laterality Date   COLONOSCOPY  04/04/2009   normal- Dr Vira Agar   COLONOSCOPY WITH PROPOFOL N/A 06/04/2020   Procedure: COLONOSCOPY WITH PROPOFOL;  Surgeon: Lucilla Lame, MD;  Location: [facility];  Service: Endoscopy;  Laterality: N/A;   CORONARY ARTERY BYPASS GRAFT N/A 2008   ESOPHAGOGASTRODUODENOSCOPY (EGD) WITH PROPOFOL N/A 06/04/2020   Procedure: ESOPHAGOGASTRODUODENOSCOPY (EGD) WITH PROPOFOL;  Surgeon: Lucilla Lame, MD;  Location: [facility];  Service: Endoscopy;  Laterality: N/A;   FRACTURE SURGERY     VAGINAL HYSTERECTOMY     DUB    Social History   Tobacco Use   Smoking status:  Never    Passive exposure: Never   Smokeless tobacco: Never  Vaping Use   Vaping Use: Never used  Substance Use Topics   Alcohol use: No   Drug use: No     Medication list has been reviewed and updated.  Current Meds  Medication Sig   atorvastatin (LIPITOR) 80 MG tablet TAKE (1) TABLET BY MOUTH EVERY DAY   Cyanocobalamin (VITAMIN B-12 IJ) Inject 1,000 vials as directed every 30 (thirty) days. Patient Not sure of dosage   ferrous sulfate 325 (65 FE) MG tablet Take 325 mg by mouth daily with breakfast.   potassium chloride SA (KLOR-CON M) 20 MEQ tablet Take 1 tablet (20 mEq total) by mouth daily.       04/08/2021    2:06 PM 07/20/2020    2:04 PM 09/24/2019    9:46 AM 03/21/2019   10:12 AM  GAD 7 : Generalized Anxiety Score  Nervous, Anxious, on Edge 0 2 0 0  Control/stop worrying 0 2 0 0  Worry too much - different things 0 2 0 0  Trouble relaxing 0 0 0 0  Restless 0 0 0 0  Easily annoyed or irritable 0 1 0 0  Afraid - awful might happen 0 1 0 0  Total GAD 7 Score 0 8 0 0  Anxiety Difficulty Not difficult at all Not difficult at all         04/08/2021    2:06 PM 02/03/2021    3:04 PM 07/20/2020    2:03 PM  Depression screen PHQ 2/9  Decreased Interest 0 0 0  Down, Depressed, Hopeless 0 0 0  PHQ - 2 Score 0 0 0  Altered sleeping 0  1  Tired, decreased energy 0  1  Change in appetite 0  3  Feeling bad or failure about yourself  0  0  Trouble concentrating 0  0  Moving slowly or fidgety/restless 0  0  Suicidal thoughts 0  0  PHQ-9 Score 0  5  Difficult doing work/chores Not difficult at all  Very difficult    BP Readings from Last 3 Encounters:  10/27/21 130/80  10/19/21 126/66  10/19/21 129/73    Physical Exam HENT:     Right Ear: Tympanic membrane normal.     Left Ear: Tympanic membrane normal.  Cardiovascular:     Heart sounds: Normal heart sounds. No murmur heard.    No friction rub. No gallop.  Pulmonary:     Breath sounds: No wheezing or rhonchi.   Abdominal:     Tenderness: There is no abdominal tenderness. There is no right CVA tenderness, left CVA tenderness or guarding.     Wt Readings from Last 3 Encounters:  10/27/21 109 lb (49.4 kg)  10/19/21 110 lb 1.6 oz (49.9 kg)  10/19/21 110 lb (49.9 kg)    BP 130/80   Pulse 76   Ht '5\' 5"'$  (1.651 m)   Wt 109 lb (49.4 kg)   BMI 18.14 kg/m   Assessment and Plan:  1. Burning with  urination Onset of dysuria over the past 3 days.  Patient presents with dysuria without frequency urgency.  Exam notes no tenderness suprapubic nor CVA tenderness.  However dipstick of urine notes 2+ leukocytes.  With upcoming cystoscopy we will initiate cephalexin 500 mg 2-3 times a day and we have instructed patient to call urology to see if this will affect upcoming cystoscopy. - POCT urinalysis dipstick  2. Dysuria As noted above initiation of both cephalexin 500 mg twice a day for 3 days since there is only 2+ leukocytes and no physical suprapubic tenderness or CVA tenderness.  We will also give her Diflucan in the event that this is localized candidiasis.  If she continues to have any nystatin cream may she may use that in the localized area as well - cephALEXin (KEFLEX) 500 MG capsule; Take 1 capsule (500 mg total) by mouth 2 (two) times daily.  Dispense: 6 capsule; Refill: 0 - fluconazole (DIFLUCAN) 150 MG tablet; Take 1 tablet (150 mg total) by mouth once for 1 dose.  Dispense: 1 tablet; Refill: 0

## 2021-10-27 NOTE — Telephone Encounter (Signed)
  Chief Complaint: burning on skin of vulva/vagina when urinated Symptoms: burning of skin Frequency: 3 days Pertinent Negatives: Patient denies discharge, pain Disposition: '[]'$ ED /'[]'$ Urgent Care (no appt availability in office) / '[x]'$ Appointment(In office/virtual)/ '[]'$  [facility]/ '[]'$ Home Care/ '[]'$ Refused Recommended Disposition /'[]'$ [facility]/ '[]'$  Follow-up with PCP Additional Notes: Patient has been on recent antibiotic treatment and does have procedures scheduled- would like to have this resolved before procedure

## 2021-10-27 NOTE — Telephone Encounter (Signed)
Summary: personal discomfort   The patient has experienced a light burning sensation in their personal area when urinating for roughly 3 days   The patient has not noticed any odor, discharge or skin irritation   The patient would like to speak with a member of clinical staff further when possible      Reason for Disposition  All other vulvar symptoms  (Exception: Feels like prior yeast infection, minor abrasion, mild rash < 24 hour duration, mild itching.)  Answer Assessment - Initial Assessment Questions 1. SYMPTOM: "What's the main symptom you're concerned about?" (e.g., rash, itching, swelling, dryness)     Burning on skin of vulva 2. LOCATION: "Where is the  burning located?" (e.g., inside/outside, left/right)     Outside of vagina 3. ONSET: "When did the  burning  start?"     2-3 days ago 4. PAIN: "Is there any pain?" If Yes, ask: "How bad is it?" (Scale: 1-10; mild, moderate, severe)   -  MILD (1-3): Doesn't interfere with normal activities.    -  MODERATE (4-7): Interferes with normal activities (e.g., work or school) or awakens from sleep.     -  SEVERE (8-10): Excruciating pain, unable to do any normal activities.     No pain 5. CAUSE: "What do you think is causing the symptoms?"     Unsure- possible yeast 6. OTHER SYMPTOMS: "Do you have any other symptoms?" (e.g., fever, vaginal bleeding, pain with urination)     Burning when urinating  Protocols used: Vulvar Symptoms-A-AH

## 2021-10-28 ENCOUNTER — Other Ambulatory Visit: Payer: Medicare PPO

## 2021-10-28 ENCOUNTER — Other Ambulatory Visit: Payer: Self-pay

## 2021-10-28 ENCOUNTER — Encounter: Payer: Self-pay | Admitting: Family Medicine

## 2021-10-28 DIAGNOSIS — N200 Calculus of kidney: Secondary | ICD-10-CM

## 2021-10-28 DIAGNOSIS — H43813 Vitreous degeneration, bilateral: Secondary | ICD-10-CM | POA: Diagnosis not present

## 2021-10-28 DIAGNOSIS — H5203 Hypermetropia, bilateral: Secondary | ICD-10-CM | POA: Diagnosis not present

## 2021-10-28 DIAGNOSIS — H52223 Regular astigmatism, bilateral: Secondary | ICD-10-CM | POA: Diagnosis not present

## 2021-10-28 DIAGNOSIS — H524 Presbyopia: Secondary | ICD-10-CM | POA: Diagnosis not present

## 2021-10-28 DIAGNOSIS — Z961 Presence of intraocular lens: Secondary | ICD-10-CM | POA: Diagnosis not present

## 2021-10-29 ENCOUNTER — Ambulatory Visit: Payer: Medicare PPO | Admitting: Urgent Care

## 2021-10-29 ENCOUNTER — Telehealth: Payer: Self-pay

## 2021-10-29 ENCOUNTER — Other Ambulatory Visit: Payer: Self-pay | Admitting: Family Medicine

## 2021-10-29 ENCOUNTER — Other Ambulatory Visit: Payer: Self-pay | Admitting: Urology

## 2021-10-29 ENCOUNTER — Ambulatory Visit
Admission: RE | Admit: 2021-10-29 | Discharge: 2021-10-29 | Disposition: A | Discharge status: Home / Self Care | Payer: Medicare PPO | Source: Ambulatory Visit | Attending: Urology | Admitting: Urology

## 2021-10-29 ENCOUNTER — Encounter: Payer: Self-pay | Admitting: Urology

## 2021-10-29 ENCOUNTER — Ambulatory Visit: Payer: Medicare PPO

## 2021-10-29 ENCOUNTER — Other Ambulatory Visit: Payer: Self-pay

## 2021-10-29 ENCOUNTER — Encounter
Admission: RE | Disposition: A | Discharge status: Home / Self Care | Payer: Self-pay | Source: Ambulatory Visit | Attending: Urology

## 2021-10-29 DIAGNOSIS — Z9221 Personal history of antineoplastic chemotherapy: Secondary | ICD-10-CM | POA: Diagnosis not present

## 2021-10-29 DIAGNOSIS — I2510 Atherosclerotic heart disease of native coronary artery without angina pectoris: Secondary | ICD-10-CM | POA: Insufficient documentation

## 2021-10-29 DIAGNOSIS — I10 Essential (primary) hypertension: Secondary | ICD-10-CM | POA: Diagnosis not present

## 2021-10-29 DIAGNOSIS — Z8543 Personal history of malignant neoplasm of ovary: Secondary | ICD-10-CM | POA: Insufficient documentation

## 2021-10-29 DIAGNOSIS — R300 Dysuria: Secondary | ICD-10-CM

## 2021-10-29 DIAGNOSIS — N200 Calculus of kidney: Secondary | ICD-10-CM | POA: Diagnosis not present

## 2021-10-29 DIAGNOSIS — Z85828 Personal history of other malignant neoplasm of skin: Secondary | ICD-10-CM | POA: Diagnosis not present

## 2021-10-29 DIAGNOSIS — Z951 Presence of aortocoronary bypass graft: Secondary | ICD-10-CM | POA: Diagnosis not present

## 2021-10-29 DIAGNOSIS — Z79899 Other long term (current) drug therapy: Secondary | ICD-10-CM | POA: Diagnosis not present

## 2021-10-29 DIAGNOSIS — Z85038 Personal history of other malignant neoplasm of large intestine: Secondary | ICD-10-CM | POA: Insufficient documentation

## 2021-10-29 DIAGNOSIS — I252 Old myocardial infarction: Secondary | ICD-10-CM | POA: Diagnosis not present

## 2021-10-29 DIAGNOSIS — F419 Anxiety disorder, unspecified: Secondary | ICD-10-CM | POA: Insufficient documentation

## 2021-10-29 DIAGNOSIS — D561 Beta thalassemia: Secondary | ICD-10-CM | POA: Insufficient documentation

## 2021-10-29 DIAGNOSIS — D509 Iron deficiency anemia, unspecified: Secondary | ICD-10-CM | POA: Insufficient documentation

## 2021-10-29 DIAGNOSIS — E785 Hyperlipidemia, unspecified: Secondary | ICD-10-CM | POA: Insufficient documentation

## 2021-10-29 DIAGNOSIS — I25810 Atherosclerosis of coronary artery bypass graft(s) without angina pectoris: Secondary | ICD-10-CM | POA: Diagnosis not present

## 2021-10-29 DIAGNOSIS — I700 Atherosclerosis of aorta: Secondary | ICD-10-CM | POA: Diagnosis not present

## 2021-10-29 HISTORY — DX: Bradycardia, unspecified: R00.1

## 2021-10-29 HISTORY — DX: Atherosclerotic heart disease of native coronary artery without angina pectoris: I25.10

## 2021-10-29 HISTORY — PX: CYSTOSCOPY/URETEROSCOPY/HOLMIUM LASER/STENT PLACEMENT: SHX6546

## 2021-10-29 HISTORY — DX: Beta thalassemia: D56.1

## 2021-10-29 HISTORY — DX: Unspecified malignant neoplasm of skin, unspecified: C44.90

## 2021-10-29 HISTORY — DX: Atherosclerosis of aorta: I70.0

## 2021-10-29 LAB — MICROSCOPIC EXAMINATION

## 2021-10-29 LAB — URINALYSIS, COMPLETE
Bilirubin, UA: NEGATIVE
Glucose, UA: NEGATIVE
Nitrite, UA: NEGATIVE
Specific Gravity, UA: 1.025 (ref 1.005–1.030)
Urobilinogen, Ur: 0.2 mg/dL (ref 0.2–1.0)
pH, UA: 5.5 (ref 5.0–7.5)

## 2021-10-29 SURGERY — CYSTOSCOPY/URETEROSCOPY/HOLMIUM LASER/STENT PLACEMENT
Anesthesia: General | Site: Ureter | Laterality: Right

## 2021-10-29 MED ORDER — DEXAMETHASONE SODIUM PHOSPHATE 10 MG/ML IJ SOLN
INTRAMUSCULAR | Status: DC | PRN
  Administered 2021-10-29: 10 mg via INTRAVENOUS

## 2021-10-29 MED ORDER — ONDANSETRON HCL 4 MG/2ML IJ SOLN
INTRAMUSCULAR | Status: AC
  Filled 2021-10-29: qty 2

## 2021-10-29 MED ORDER — FAMOTIDINE 20 MG PO TABS
ORAL_TABLET | ORAL | Status: AC
  Administered 2021-10-29: 20 mg via ORAL
  Filled 2021-10-29: qty 1

## 2021-10-29 MED ORDER — ONDANSETRON HCL 4 MG/2ML IJ SOLN
INTRAMUSCULAR | Status: DC | PRN
  Administered 2021-10-29: 4 mg via INTRAVENOUS

## 2021-10-29 MED ORDER — IOHEXOL 180 MG/ML  SOLN
INTRAMUSCULAR | Status: DC | PRN
  Administered 2021-10-29: 10 mL

## 2021-10-29 MED ORDER — PROPOFOL 10 MG/ML IV BOLUS
INTRAVENOUS | Status: DC | PRN
  Administered 2021-10-29: 100 mg via INTRAVENOUS

## 2021-10-29 MED ORDER — ONDANSETRON HCL 4 MG/2ML IJ SOLN: 4.0000 mg | Freq: Once | INTRAMUSCULAR | Status: DC | PRN

## 2021-10-29 MED ORDER — ORAL CARE MOUTH RINSE: 15.0000 mL | Freq: Once | OROMUCOSAL | Status: AC

## 2021-10-29 MED ORDER — SODIUM CHLORIDE 0.9 % IR SOLN
Status: DC | PRN
  Administered 2021-10-29: 3000 mL via INTRAVESICAL

## 2021-10-29 MED ORDER — FENTANYL CITRATE (PF) 100 MCG/2ML IJ SOLN
INTRAMUSCULAR | Status: AC
  Filled 2021-10-29: qty 2

## 2021-10-29 MED ORDER — LACTATED RINGERS IV SOLN: INTRAVENOUS | Status: DC

## 2021-10-29 MED ORDER — FAMOTIDINE 20 MG PO TABS: 20.0000 mg | ORAL_TABLET | Freq: Once | ORAL | Status: AC

## 2021-10-29 MED ORDER — FENTANYL CITRATE (PF) 100 MCG/2ML IJ SOLN
INTRAMUSCULAR | Status: DC | PRN
  Administered 2021-10-29: 50 ug via INTRAVENOUS

## 2021-10-29 MED ORDER — CEPHALEXIN 500 MG PO CAPS: 500.0000 mg | ORAL_CAPSULE | Freq: Two times a day (BID) | ORAL | 0 refills | Status: DC

## 2021-10-29 MED ORDER — LIDOCAINE HCL (PF) 2 % IJ SOLN
INTRAMUSCULAR | Status: AC
  Filled 2021-10-29: qty 5

## 2021-10-29 MED ORDER — CIPROFLOXACIN IN D5W 400 MG/200ML IV SOLN
INTRAVENOUS | Status: AC
  Filled 2021-10-29: qty 200

## 2021-10-29 MED ORDER — SUGAMMADEX SODIUM 200 MG/2ML IV SOLN
INTRAVENOUS | Status: DC | PRN
  Administered 2021-10-29: 100 mg via INTRAVENOUS

## 2021-10-29 MED ORDER — CHLORHEXIDINE GLUCONATE 0.12 % MT SOLN
OROMUCOSAL | Status: AC
  Administered 2021-10-29: 15 mL via OROMUCOSAL
  Filled 2021-10-29: qty 15

## 2021-10-29 MED ORDER — DEXAMETHASONE SODIUM PHOSPHATE 10 MG/ML IJ SOLN
INTRAMUSCULAR | Status: AC
  Filled 2021-10-29: qty 1

## 2021-10-29 MED ORDER — PROPOFOL 10 MG/ML IV BOLUS
INTRAVENOUS | Status: AC
  Filled 2021-10-29: qty 20

## 2021-10-29 MED ORDER — CHLORHEXIDINE GLUCONATE 0.12 % MT SOLN: 15.0000 mL | Freq: Once | OROMUCOSAL | Status: AC

## 2021-10-29 MED ORDER — HYDROCODONE-ACETAMINOPHEN 5-325 MG PO TABS
1.0000 | ORAL_TABLET | Freq: Four times a day (QID) | ORAL | 0 refills | Status: AC | PRN
Start: 2021-10-29 — End: 2021-11-03

## 2021-10-29 MED ORDER — CIPROFLOXACIN IN D5W 400 MG/200ML IV SOLN
400.0000 mg | INTRAVENOUS | Status: AC
  Administered 2021-10-29: 400 mg via INTRAVENOUS

## 2021-10-29 MED ORDER — FENTANYL CITRATE (PF) 100 MCG/2ML IJ SOLN: 25.0000 ug | INTRAMUSCULAR | Status: DC | PRN

## 2021-10-29 MED ORDER — ROCURONIUM BROMIDE 10 MG/ML (PF) SYRINGE
PREFILLED_SYRINGE | INTRAVENOUS | Status: AC
  Filled 2021-10-29: qty 10

## 2021-10-29 MED ORDER — ROCURONIUM BROMIDE 100 MG/10ML IV SOLN
INTRAVENOUS | Status: DC | PRN
  Administered 2021-10-29: 10 mg via INTRAVENOUS
  Administered 2021-10-29: 40 mg via INTRAVENOUS

## 2021-10-29 MED ORDER — LIDOCAINE HCL (CARDIAC) PF 100 MG/5ML IV SOSY
PREFILLED_SYRINGE | INTRAVENOUS | Status: DC | PRN
  Administered 2021-10-29: 60 mg via INTRAVENOUS

## 2021-10-29 SURGICAL SUPPLY — 28 items
ADH LQ OCL WTPRF AMP STRL LF (MISCELLANEOUS)
ADHESIVE MASTISOL STRL (MISCELLANEOUS) IMPLANT
BAG DRAIN CYSTO-URO LG1000N (MISCELLANEOUS) ×2 IMPLANT
BRUSH SCRUB EZ 1% IODOPHOR (MISCELLANEOUS) ×2 IMPLANT
CATH URET FLEX-TIP 2 LUMEN 10F (CATHETERS) ×1 IMPLANT
CATH URETL OPEN 5X70 (CATHETERS) IMPLANT
CNTNR SPEC 2.5X3XGRAD LEK (MISCELLANEOUS)
CONT SPEC 4OZ STER OR WHT (MISCELLANEOUS)
CONT SPEC 4OZ STRL OR WHT (MISCELLANEOUS)
CONTAINER SPEC 2.5X3XGRAD LEK (MISCELLANEOUS) IMPLANT
DRAPE UTILITY 15X26 TOWEL STRL (DRAPES) ×2 IMPLANT
GLOVE SURG UNDER POLY LF SZ7.5 (GLOVE) ×2 IMPLANT
GOWN STRL REUS W/ TWL LRG LVL3 (GOWN DISPOSABLE) ×1 IMPLANT
GOWN STRL REUS W/ TWL XL LVL3 (GOWN DISPOSABLE) ×1 IMPLANT
GOWN STRL REUS W/TWL LRG LVL3 (GOWN DISPOSABLE) ×2
GOWN STRL REUS W/TWL XL LVL3 (GOWN DISPOSABLE) ×2
GUIDEWIRE STR DUAL SENSOR (WIRE) ×2 IMPLANT
IV NS IRRIG 3000ML ARTHROMATIC (IV SOLUTION) ×2 IMPLANT
KIT TURNOVER CYSTO (KITS) ×2 IMPLANT
PACK CYSTO AR (MISCELLANEOUS) ×2 IMPLANT
SET CYSTO W/LG BORE CLAMP LF (SET/KITS/TRAYS/PACK) ×2 IMPLANT
SHEATH NAVIGATOR HD 12/14X36 (SHEATH) ×1 IMPLANT
STENT URET 6FRX24 CONTOUR (STENTS) ×1 IMPLANT
STENT URET 6FRX26 CONTOUR (STENTS) IMPLANT
SURGILUBE 2OZ TUBE FLIPTOP (MISCELLANEOUS) ×2 IMPLANT
SYR 10ML LL (SYRINGE) ×2 IMPLANT
VALVE UROSEAL ADJ ENDO (VALVE) IMPLANT
WATER STERILE IRR 500ML POUR (IV SOLUTION) ×2 IMPLANT

## 2021-10-29 NOTE — Anesthesia Preprocedure Evaluation (Addendum)
Anesthesia Evaluation  Patient identified by MRN, date of birth, ID band Patient awake    Reviewed: Allergy & Precautions, NPO status , Patient's Chart, lab work & pertinent test results  Airway Mallampati: III  TM Distance: <3 FB Neck ROM: Limited    Dental  (+) Teeth Intact   Pulmonary neg pulmonary ROS,    Pulmonary exam normal breath sounds clear to auscultation       Cardiovascular Exercise Tolerance: Good hypertension, Pt. on medications + CAD, + Past MI and + CABG  Normal cardiovascular exam Rhythm:Regular     Neuro/Psych negative neurological ROS  negative psych ROS   GI/Hepatic negative GI ROS, Neg liver ROS,   Endo/Other  negative endocrine ROS  Renal/GU negative Renal ROS     Musculoskeletal   Abdominal Normal abdominal exam  (+)   Peds negative pediatric ROS (+)  Hematology negative hematology ROS (+) Blood dyscrasia, anemia ,   Anesthesia Other Findings Past Medical History: 06/2020: Adenocarcinoma of cecum (HCC)     Comment:  a.) stage IIIc (G2, (+) LVI, (-) PNI, T3N3BMX) --> s/p               RIGHT hemicolectomy + oral capecitabine No date: Anemia No date: Anxiety     Comment:  a.) on BZO (alprazolam) PRN No date: Aortic atherosclerosis (HCC) No date: Beta thalassemia (HCC) No date: Bradycardia 08/14/2006: CAD (coronary artery disease)     Comment:  a.) STEMI 08/14/2006 --> LHC at [facility] --> EF 45%, 95%               mLAD, 95% OM1, and 75% LV groove artery --> transfer to               [facility] for CVTS consult; b.) 3v CABG 08/14/2006 No date: History of kidney stones No date: Hyperlipidemia No date: Hypertension No date: Osteoporosis No date: Ovarian abscess 2005: Ovarian cancer ([place])     Comment:  a.) stage IIIb adenocarcinoma --> s/p CRS 2005 + 6               cycles of carboplatin/paclitaxel No date: Pneumonia     Comment:  as a baby No date: Rheumatic fever 08/14/2006: S/P CABG x 3      Comment:  a.) LIMA-LAD, SVG-RI, SVG-OM1 No date: Skin cancer 08/14/2006: STEMI (ST elevation myocardial infarction) ([place])     Comment:  a.) LHC at [facility] --> transferred to [facility] for emergent               CABG  Past Surgical History: 04/04/2009: COLONOSCOPY     Comment:  normal- Dr Vira Agar 06/04/2020: COLONOSCOPY WITH PROPOFOL; N/A     Comment:  Procedure: COLONOSCOPY WITH PROPOFOL;  Surgeon: Lucilla Lame, MD;  Location: [place];  Service:               Endoscopy;  Laterality: N/A; 08/14/2006: CORONARY ARTERY BYPASS GRAFT; N/A     Comment:  Procedure: CORONARY ARTERY BYPASS GRAFT; Location: [facility];              Surgeon: Walker Shadow, MD 06/04/2020: ESOPHAGOGASTRODUODENOSCOPY (EGD) WITH PROPOFOL; N/A     Comment:  Procedure: ESOPHAGOGASTRODUODENOSCOPY (EGD) WITH               PROPOFOL;  Surgeon: Lucilla Lame, MD;  Location: [facility];  Service: Endoscopy;  Laterality: N/A; No date: FRACTURE SURGERY 08/14/2006: LEFT HEART CATH AND CORONARY ANGIOGRAPHY; Left     Comment:  Procedure: LEFT HEART CATHETERIZATION AND CORONARY               ANGIOGRAPHY; Location: [facility]: Surgeon: Katrine Coho, MD No date: VAGINAL HYSTERECTOMY     Comment:  DUB  BMI    Body Mass Index: 18.14 kg/m      Reproductive/Obstetrics negative OB ROS                            Anesthesia Physical Anesthesia Plan  ASA: 3  Anesthesia Plan: General   Post-op Pain Management:    Induction: Intravenous  PONV Risk Score and Plan: Ondansetron and Dexamethasone  Airway Management Planned: Oral ETT  Additional Equipment:   Intra-op Plan:   Post-operative Plan: Extubation in OR  Informed Consent: I have reviewed the patients History and Physical, chart, labs and discussed the procedure including the risks, benefits and alternatives for the proposed anesthesia with the patient or authorized representative who has indicated his/her  understanding and acceptance.     Dental Advisory Given  Plan Discussed with: CRNA and Surgeon  Anesthesia Plan Comments:         Anesthesia Quick Evaluation

## 2021-10-29 NOTE — Telephone Encounter (Signed)
This patient's family called in and would like to know if she should continue Keflex, or if she needed to start something else for the infection. She was given 3 days worth by Dr. Ronnald Ramp on Wednesday.

## 2021-10-29 NOTE — Anesthesia Procedure Notes (Signed)
Procedure Name: Intubation Date/Time: 10/29/2021 8:52 AM  Performed by: Loletha Grayer, CRNAPre-anesthesia Checklist: Patient identified, Patient being monitored, Timeout performed, Emergency Drugs available and Suction available Patient Re-evaluated:Patient Re-evaluated prior to induction Oxygen Delivery Method: Circle system utilized Preoxygenation: Pre-oxygenation with 100% oxygen Induction Type: IV induction Ventilation: Mask ventilation without difficulty Laryngoscope Size: 3 and McGraph Grade View: Grade I Tube type: Oral Tube size: 7.0 mm Number of attempts: 1 Airway Equipment and Method: Stylet Placement Confirmation: ETT inserted through vocal cords under direct vision, positive ETCO2 and breath sounds checked- equal and bilateral Secured at: 21 cm Tube secured with: Tape Dental Injury: Teeth and Oropharynx as per pre-operative assessment

## 2021-10-29 NOTE — Transfer of Care (Signed)
Immediate Anesthesia Transfer of Care Note  Patient: Phyllis Ross  Procedure(s) Performed: CYSTOSCOPY/ RIGHT STENT PLACEMENT (Right: Ureter)  Patient Location: PACU  Anesthesia Type:General  Level of Consciousness: awake  Airway & Oxygen Therapy: Patient Spontanous Breathing and Patient connected to face mask oxygen  Post-op Assessment: Report given to RN and Post -op Vital signs reviewed and stable  Post vital signs: Reviewed and stable  Last Vitals:  Vitals Value Taken Time  BP 167/76 10/29/21 0918  Temp    Pulse 78 10/29/21 0918  Resp 21 10/29/21 0918  SpO2 100 % 10/29/21 0918  Vitals shown include unvalidated device data.  Last Pain:  Vitals:   10/29/21 0728  TempSrc: Oral  PainSc: 0-No pain         Complications: No notable events documented.

## 2021-10-29 NOTE — Progress Notes (Signed)
Surgical Physician Order Form [facility]  * Scheduling expectation :  2 weeks  *Length of Case: 1.5 hours  *Clearance needed: no  *Anticoagulation Instructions: May continue all anticoagulants  *Aspirin Instructions: Ok to continue all  *Post-op visit Date/Instructions:   TBD  *Diagnosis: Right Nephrolithiasis  *Procedure: right  Ureteroscopy w/laser lithotripsy & stent exchange (23300)   Additional orders: N/A  -Admit type: OUTpatient  -Anesthesia: General  -VTE Prophylaxis Standing Order SCD's       Other:   -Standing Lab Orders Per Anesthesia    Lab other:  Urine culture sent 7/27  -Standing Test orders EKG/Chest x-ray per Anesthesia       Test other:   - Medications:  Cipro '400mg'$  IV  -Other orders:  N/A

## 2021-10-29 NOTE — Interval H&P Note (Signed)
UROLOGY H&P UPDATE  Agree with prior H&P dated 10/19/2021.  Large 2 cm right renal stone, risk and benefits of ureteroscopy versus PCNL discussed at length, and family opted for ureteroscopy with understanding that staged procedure may be required.  Recently seen by PCP for dysuria, and dipstick urinalysis showed 1+ leukocytes.  She was started on Keflex.  Urinalysis in clinic yesterday showed 11-30 RBCs, 11-30 WBCs, few bacteria, nitrite negative.  Urine culture 7/18 showed no growth, and urinalysis at that time was more concerning for infection.  Cardiac: RRR Lungs: CTA bilaterally  Laterality: Right Procedure: Right ureteroscopy, laser lithotripsy, stent placement  Urine: Urine culture 7/18 no growth  We specifically discussed the risks ureteroscopy including bleeding, infection/sepsis, stent related symptoms including flank pain/urgency/frequency/incontinence/dysuria, ureteral injury, inability to access stone, or need for staged or additional procedures.  We discussed possible need for stent placement with delayed ureteroscopy if any evidence of purulent urinary infection, as well as possible need for staged procedure with her large stone.   Billey Co, MD 10/29/2021

## 2021-10-29 NOTE — Op Note (Signed)
Date of procedure: 10/29/21  Preoperative diagnosis:  Right renal stone  Postoperative diagnosis:  Same  Procedure: Cystoscopy, right ureteral stent placement  Surgeon: Nickolas Madrid, MD  Anesthesia: General  Complications: None  Intraoperative findings:  1.  Normal cystoscopy, ureteral orifices orthotopic bilaterally 2.  Cloudy/purulent drainage from right kidney with wire placement, and stent placed  EBL: None  Specimens: None  Drains: Right 6 French by 24 cm ureteral stent  Indication: Phyllis Ross is a 86 y.o. patient with large 2 cm right renal stone with likely intermittent obstruction and flank pain as well as recurrent infections.  After reviewing the management options for treatment, they elected to proceed with the above surgical procedure(s). We have discussed the potential benefits and risks of the procedure, side effects of the proposed treatment, the likelihood of the patient achieving the goals of the procedure, and any potential problems that might occur during the procedure or recuperation. Informed consent has been obtained.  Description of procedure:  The patient was taken to the operating room and general anesthesia was induced. SCDs were placed for DVT prophylaxis. The patient was placed in the dorsal lithotomy position, prepped and draped in the usual sterile fashion, and preoperative antibiotics(Cipro) were administered. A preoperative time-out was performed.   A 21 French rigid cystoscope was used to intubate the urethra and thorough cystoscopy was performed.  The bladder was grossly normal, and the ureteral orifices were orthotopic bilaterally.  A sensor wire was advanced into the right ureteral orifice and advanced into the kidney under fluoroscopic vision.  There was a large 2 cm stone clearly seen on fluoroscopy.  There was brisk drainage of cloudy and purulent material from the right kidney after wire placement.  At this point I opted to place a  stent to maximize drainage and continue antibiotics prior to scheduling definitive ureteroscopy in 2 to 3 weeks with high suspicion for infection today.  A 6 French by 24 cm ureteral stent was uneventfully placed over the wire with an excellent curl in the upper pole, as well as under direct vision the bladder.  Urine drained through the side ports of the stent.  The bladder was drained and this concluded our procedure.  Disposition: Stable to PACU  Plan: Schedule right ureteroscopy/laser/stent in 2 weeks Continue Keflex, follow-up urine culture from yesterday  Nickolas Madrid, MD

## 2021-10-29 NOTE — Telephone Encounter (Signed)
Yes. Please extend it to 10 days, and we will call with culture when resulted next week if we need to change - per Dr. Diamantina Providence, MD  I have contacted patient and she is aware. I have sent medication in to [facility] in [place] per her request.

## 2021-10-29 NOTE — Anesthesia Postprocedure Evaluation (Signed)
Anesthesia Post Note  Patient: Phyllis Ross  Procedure(s) Performed: CYSTOSCOPY/ RIGHT STENT PLACEMENT (Right: Ureter)  Patient location during evaluation: PACU Anesthesia Type: General Level of consciousness: awake Pain management: pain level controlled Respiratory status: spontaneous breathing and nonlabored ventilation Cardiovascular status: stable Anesthetic complications: no   No notable events documented.   Last Vitals:  Vitals:   10/29/21 0943 10/29/21 1022  BP: (!) 182/85 (!) 157/77  Pulse: 71 66  Resp: 16 18  Temp: (!) 36.3 C   SpO2: 100% 100%    Last Pain:  Vitals:   10/29/21 0943  TempSrc: Temporal  PainSc: 0-No pain                 VAN STAVEREN,Jeananne Bedwell

## 2021-10-29 NOTE — Progress Notes (Signed)
[facility] Posting Form   Surgery Date/Time: Date: 11/12/2021  Surgeon: Dr. Nickolas Madrid, MD  Surgery Location: Day Surgery  Inpt ( No  )   Outpt (Yes)   Obs ( No  )   Diagnosis: N20.0 Right Nephrolithiasis  -CPT: 67014  Surgery: Right Ureteroscopy with laser lithotripsy and stent exchange  Stop Anticoagulations: Yes and may continue ASA  Cardiac/Medical/Pulmonary Clearance needed: no  *Orders entered into EPIC  Date: 10/29/21   *Case booked in EPIC  Date: 10/29/21  *Notified pt of Surgery: Date: 10/29/21  PRE-OP UA & CX: yes, obtained on 10/28/2021  *Placed into Prior Authorization Work Fabio Bering Date: 10/29/21   Assistant/laser/rep:No

## 2021-11-02 LAB — CULTURE, URINE COMPREHENSIVE

## 2021-11-04 ENCOUNTER — Telehealth: Payer: Self-pay | Admitting: Family Medicine

## 2021-11-04 NOTE — Telephone Encounter (Signed)
Copied from [place]. Topic: General - Inquiry >> Nov 04, 2021 11:44 AM Penni Bombard wrote: Reason for CRM: pt would like Baxter Flattery to call her back regarding a question about Eliquis.  CB@  [phone]

## 2021-11-05 DIAGNOSIS — L570 Actinic keratosis: Secondary | ICD-10-CM | POA: Diagnosis not present

## 2021-11-05 DIAGNOSIS — L821 Other seborrheic keratosis: Secondary | ICD-10-CM | POA: Diagnosis not present

## 2021-11-05 DIAGNOSIS — X32XXXA Exposure to sunlight, initial encounter: Secondary | ICD-10-CM | POA: Diagnosis not present

## 2021-11-05 DIAGNOSIS — Z85828 Personal history of other malignant neoplasm of skin: Secondary | ICD-10-CM | POA: Diagnosis not present

## 2021-11-05 DIAGNOSIS — Z08 Encounter for follow-up examination after completed treatment for malignant neoplasm: Secondary | ICD-10-CM | POA: Diagnosis not present

## 2021-11-05 DIAGNOSIS — D485 Neoplasm of uncertain behavior of skin: Secondary | ICD-10-CM | POA: Diagnosis not present

## 2021-11-08 ENCOUNTER — Other Ambulatory Visit: Payer: Self-pay

## 2021-11-08 ENCOUNTER — Encounter
Admission: RE | Admit: 2021-11-08 | Discharge: 2021-11-08 | Disposition: A | Discharge status: Home / Self Care | Payer: Medicare PPO | Source: Ambulatory Visit | Attending: Urology | Admitting: Urology

## 2021-11-08 HISTORY — DX: Unspecified osteoarthritis, unspecified site: M19.90

## 2021-11-08 HISTORY — DX: Chronic kidney disease, unspecified: N18.9

## 2021-11-08 NOTE — Patient Instructions (Addendum)
Your procedure is scheduled on: 11/12/21  Report to the Registration Desk on the 1st floor of the [facility]. To find out your arrival time, please call [phone] between 1PM - 3PM on: 11/11/21  If your arrival time is 6:00 am, do not arrive prior to that time as the [facility] entrance doors do not open until 6:00 am.  REMEMBER: Instructions that are not followed completely may result in serious medical risk, up to and including death; or upon the discretion of your surgeon and anesthesiologist your surgery may need to be rescheduled.  Do not eat food or drink any fluids after midnight the night before surgery.  No gum chewing, lozengers or hard candies.  TAKE THESE MEDICATIONS THE MORNING OF SURGERY WITH A SIP OF WATER: - potassium chloride SA    One week prior to surgery: meloxicam (MOBIC) Stop Anti-inflammatories (NSAIDS) such as Advil, Aleve, Ibuprofen, Motrin, Naproxen, Naprosyn and Aspirin based products such as Excedrin, Goodys Powder, BC Powder.  Stop ANY OVER THE COUNTER supplements until after surgery.  You may take Tylenol if needed for pain up until the day of surgery.  No Alcohol for 24 hours before or after surgery.  No Smoking including e-cigarettes for 24 hours prior to surgery.  No chewable tobacco products for at least 6 hours prior to surgery.  No nicotine patches on the day of surgery.  Do not use any "recreational" drugs for at least a week prior to your surgery.  Please be advised that the combination of cocaine and anesthesia may have negative outcomes, up to and including death. If you test positive for cocaine, your surgery will be cancelled.  On the morning of surgery brush your teeth with toothpaste and water, you may rinse your mouth with mouthwash if you wish. Do not swallow any toothpaste or mouthwash.  Use CHG Soap or wipes as directed on instruction sheet.  Do not wear jewelry, make-up, hairpins, clips or nail polish.  Do not wear  lotions, powders, or perfumes.   Do not shave body from the neck down 48 hours prior to surgery just in case you cut yourself which could leave a site for infection.  Also, freshly shaved skin may become irritated if using the CHG soap.  Contact lenses, hearing aids and dentures may not be worn into surgery.  Do not bring valuables to the hospital. [facility] is not responsible for any missing/lost belongings or valuables.   Notify your doctor if there is any change in your medical condition (cold, fever, infection).  Wear comfortable clothing (specific to your surgery type) to the hospital.  After surgery, you can help prevent lung complications by doing breathing exercises.  Take deep breaths and cough every 1-2 hours. Your doctor may order a device called an Incentive Spirometer to help you take deep breaths. When coughing or sneezing, hold a pillow firmly against your incision with both hands. This is called "splinting." Doing this helps protect your incision. It also decreases belly discomfort.  If you are being admitted to the hospital overnight, leave your suitcase in the car. After surgery it may be brought to your room.  If you are being discharged the day of surgery, you will not be allowed to drive home. You will need a responsible adult (18 years or older) to drive you home and stay with you that night.   If you are taking public transportation, you will need to have a responsible adult (18 years or older) with you.  Please confirm with your physician that it is acceptable to use public transportation.   Please call the [facility] Dept. at [phone] if you have any questions about these instructions.  Surgery Visitation Policy:  Patients undergoing a surgery or procedure may have two family members or support persons with them as long as the person is not COVID-19 positive or experiencing its symptoms.   Inpatient Visitation:    Visiting hours are 7 a.m.  to 8 p.m. Up to four visitors are allowed at one time in a patient room, including children. The visitors may rotate out with other people during the day. One designated support person (adult) may remain overnight.

## 2021-11-12 ENCOUNTER — Other Ambulatory Visit: Payer: Self-pay

## 2021-11-12 ENCOUNTER — Encounter: Payer: Self-pay | Admitting: Urology

## 2021-11-12 ENCOUNTER — Ambulatory Visit
Admission: RE | Admit: 2021-11-12 | Discharge: 2021-11-12 | Disposition: A | Discharge status: Home / Self Care | Payer: Medicare PPO | Attending: Urology | Admitting: Urology

## 2021-11-12 ENCOUNTER — Encounter
Admission: RE | Disposition: A | Discharge status: Home / Self Care | Payer: Self-pay | Source: Home / Self Care | Attending: Urology

## 2021-11-12 ENCOUNTER — Ambulatory Visit: Payer: Medicare PPO | Admitting: Anesthesiology

## 2021-11-12 ENCOUNTER — Ambulatory Visit: Payer: Medicare PPO

## 2021-11-12 DIAGNOSIS — T7840XA Allergy, unspecified, initial encounter: Secondary | ICD-10-CM | POA: Diagnosis not present

## 2021-11-12 DIAGNOSIS — Z951 Presence of aortocoronary bypass graft: Secondary | ICD-10-CM | POA: Insufficient documentation

## 2021-11-12 DIAGNOSIS — I2510 Atherosclerotic heart disease of native coronary artery without angina pectoris: Secondary | ICD-10-CM | POA: Insufficient documentation

## 2021-11-12 DIAGNOSIS — D561 Beta thalassemia: Secondary | ICD-10-CM | POA: Insufficient documentation

## 2021-11-12 DIAGNOSIS — I252 Old myocardial infarction: Secondary | ICD-10-CM | POA: Insufficient documentation

## 2021-11-12 DIAGNOSIS — N201 Calculus of ureter: Secondary | ICD-10-CM | POA: Diagnosis not present

## 2021-11-12 DIAGNOSIS — N200 Calculus of kidney: Secondary | ICD-10-CM | POA: Diagnosis not present

## 2021-11-12 DIAGNOSIS — N189 Chronic kidney disease, unspecified: Secondary | ICD-10-CM | POA: Diagnosis not present

## 2021-11-12 DIAGNOSIS — I129 Hypertensive chronic kidney disease with stage 1 through stage 4 chronic kidney disease, or unspecified chronic kidney disease: Secondary | ICD-10-CM | POA: Insufficient documentation

## 2021-11-12 DIAGNOSIS — Z85038 Personal history of other malignant neoplasm of large intestine: Secondary | ICD-10-CM | POA: Insufficient documentation

## 2021-11-12 DIAGNOSIS — Z8543 Personal history of malignant neoplasm of ovary: Secondary | ICD-10-CM | POA: Diagnosis not present

## 2021-11-12 HISTORY — PX: CYSTOSCOPY WITH RETROGRADE PYELOGRAM, URETEROSCOPY AND STENT PLACEMENT: SHX5789

## 2021-11-12 SURGERY — CYSTOURETEROSCOPY, WITH RETROGRADE PYELOGRAM AND STENT INSERTION
Anesthesia: General | Site: Ureter | Laterality: Right

## 2021-11-12 MED ORDER — LIDOCAINE HCL (CARDIAC) PF 100 MG/5ML IV SOSY
PREFILLED_SYRINGE | INTRAVENOUS | Status: DC | PRN
  Administered 2021-11-12: 100 mg via INTRAVENOUS

## 2021-11-12 MED ORDER — PROPOFOL 10 MG/ML IV BOLUS
INTRAVENOUS | Status: DC | PRN
  Administered 2021-11-12: 90 mg via INTRAVENOUS

## 2021-11-12 MED ORDER — CEPHALEXIN 250 MG PO CAPS: 250.0000 mg | ORAL_CAPSULE | Freq: Every day | ORAL | 0 refills | Status: DC

## 2021-11-12 MED ORDER — PHENYLEPHRINE 80 MCG/ML (10ML) SYRINGE FOR IV PUSH (FOR BLOOD PRESSURE SUPPORT)
PREFILLED_SYRINGE | INTRAVENOUS | Status: AC
  Filled 2021-11-12: qty 10

## 2021-11-12 MED ORDER — CHLORHEXIDINE GLUCONATE 0.12 % MT SOLN
OROMUCOSAL | Status: AC
  Filled 2021-11-12: qty 15

## 2021-11-12 MED ORDER — ONDANSETRON HCL 4 MG/2ML IJ SOLN
INTRAMUSCULAR | Status: DC | PRN
  Administered 2021-11-12: 4 mg via INTRAVENOUS

## 2021-11-12 MED ORDER — OXYCODONE HCL 5 MG PO TABS
ORAL_TABLET | ORAL | Status: AC
  Filled 2021-11-12: qty 1

## 2021-11-12 MED ORDER — ACETAMINOPHEN 10 MG/ML IV SOLN: 650.0000 mg | Freq: Once | INTRAVENOUS | Status: DC | PRN

## 2021-11-12 MED ORDER — HYDROCODONE-ACETAMINOPHEN 5-325 MG PO TABS: 1.0000 | ORAL_TABLET | Freq: Four times a day (QID) | ORAL | 0 refills | Status: AC | PRN

## 2021-11-12 MED ORDER — FENTANYL CITRATE (PF) 100 MCG/2ML IJ SOLN: 25.0000 ug | INTRAMUSCULAR | Status: DC | PRN

## 2021-11-12 MED ORDER — ROCURONIUM BROMIDE 100 MG/10ML IV SOLN
INTRAVENOUS | Status: DC | PRN
  Administered 2021-11-12: 30 mg via INTRAVENOUS

## 2021-11-12 MED ORDER — SODIUM CHLORIDE 0.9 % IR SOLN
Status: DC | PRN
  Administered 2021-11-12: 3000 mL

## 2021-11-12 MED ORDER — FAMOTIDINE 20 MG PO TABS
ORAL_TABLET | ORAL | Status: AC
  Filled 2021-11-12: qty 1

## 2021-11-12 MED ORDER — SUGAMMADEX SODIUM 200 MG/2ML IV SOLN
INTRAVENOUS | Status: DC | PRN
  Administered 2021-11-12: 200 mg via INTRAVENOUS

## 2021-11-12 MED ORDER — LIDOCAINE HCL (PF) 2 % IJ SOLN
INTRAMUSCULAR | Status: AC
  Filled 2021-11-12: qty 5

## 2021-11-12 MED ORDER — PHENYLEPHRINE HCL (PRESSORS) 10 MG/ML IV SOLN
INTRAVENOUS | Status: AC
  Filled 2021-11-12: qty 1

## 2021-11-12 MED ORDER — CIPROFLOXACIN IN D5W 400 MG/200ML IV SOLN
400.0000 mg | INTRAVENOUS | Status: AC
  Administered 2021-11-12: 400 mg via INTRAVENOUS

## 2021-11-12 MED ORDER — FAMOTIDINE 20 MG PO TABS
20.0000 mg | ORAL_TABLET | Freq: Once | ORAL | Status: AC
  Administered 2021-11-12: 20 mg via ORAL

## 2021-11-12 MED ORDER — FENTANYL CITRATE (PF) 100 MCG/2ML IJ SOLN
INTRAMUSCULAR | Status: AC
  Filled 2021-11-12: qty 2

## 2021-11-12 MED ORDER — IOHEXOL 180 MG/ML  SOLN
INTRAMUSCULAR | Status: DC | PRN
  Administered 2021-11-12: 20 mL

## 2021-11-12 MED ORDER — FENTANYL CITRATE (PF) 100 MCG/2ML IJ SOLN
INTRAMUSCULAR | Status: DC | PRN
  Administered 2021-11-12 (×2): 50 ug via INTRAVENOUS

## 2021-11-12 MED ORDER — DEXAMETHASONE SODIUM PHOSPHATE 10 MG/ML IJ SOLN
INTRAMUSCULAR | Status: DC | PRN
  Administered 2021-11-12: 5 mg via INTRAVENOUS

## 2021-11-12 MED ORDER — PROMETHAZINE HCL 25 MG/ML IJ SOLN: 6.2500 mg | INTRAMUSCULAR | Status: DC | PRN

## 2021-11-12 MED ORDER — CIPROFLOXACIN IN D5W 400 MG/200ML IV SOLN
INTRAVENOUS | Status: DC
Start: 2021-11-12 — End: 2021-11-12
  Filled 2021-11-12: qty 200

## 2021-11-12 MED ORDER — ROCURONIUM BROMIDE 10 MG/ML (PF) SYRINGE
PREFILLED_SYRINGE | INTRAVENOUS | Status: AC
  Filled 2021-11-12: qty 10

## 2021-11-12 MED ORDER — PHENYLEPHRINE HCL (PRESSORS) 10 MG/ML IV SOLN
INTRAVENOUS | Status: DC | PRN
  Administered 2021-11-12: 80 ug via INTRAVENOUS
  Administered 2021-11-12 (×2): 160 ug via INTRAVENOUS
  Administered 2021-11-12 (×2): 80 ug via INTRAVENOUS

## 2021-11-12 MED ORDER — LACTATED RINGERS IV SOLN: INTRAVENOUS | Status: DC

## 2021-11-12 MED ORDER — CHLORHEXIDINE GLUCONATE 0.12 % MT SOLN
15.0000 mL | Freq: Once | OROMUCOSAL | Status: AC
  Administered 2021-11-12: 15 mL via OROMUCOSAL

## 2021-11-12 MED ORDER — OXYCODONE HCL 5 MG PO TABS
5.0000 mg | ORAL_TABLET | Freq: Once | ORAL | Status: AC | PRN
  Administered 2021-11-12: 5 mg via ORAL

## 2021-11-12 MED ORDER — ORAL CARE MOUTH RINSE: 15.0000 mL | Freq: Once | OROMUCOSAL | Status: AC

## 2021-11-12 MED ORDER — OXYCODONE HCL 5 MG/5ML PO SOLN: 5.0000 mg | Freq: Once | ORAL | Status: AC | PRN

## 2021-11-12 SURGICAL SUPPLY — 24 items
BAG DRAIN SIEMENS DORNER NS (MISCELLANEOUS) ×3 IMPLANT
BAG PRESSURE INF REUSE 3000 (BAG) ×3 IMPLANT
BRUSH SCRUB EZ 1% IODOPHOR (MISCELLANEOUS) ×3 IMPLANT
CATH URET FLEX-TIP 2 LUMEN 10F (CATHETERS) IMPLANT
CATH URETL OPEN 5X70 (CATHETERS) IMPLANT
CNTNR SPEC 2.5X3XGRAD LEK (MISCELLANEOUS) ×2
CONT SPEC 4OZ STER OR WHT (MISCELLANEOUS) ×1
CONTAINER SPEC 2.5X3XGRAD LEK (MISCELLANEOUS) ×1 IMPLANT
DRAPE UTILITY 15X26 TOWEL STRL (DRAPES) ×3 IMPLANT
FIBER LASER MOSES 200 DFL (Laser) ×2 IMPLANT
GLOVE SURG UNDER POLY LF SZ7.5 (GLOVE) ×3 IMPLANT
GOWN STRL REUS W/ TWL LRG LVL3 (GOWN DISPOSABLE) ×2 IMPLANT
GOWN STRL REUS W/ TWL XL LVL3 (GOWN DISPOSABLE) ×2 IMPLANT
GOWN STRL REUS W/TWL LRG LVL3 (GOWN DISPOSABLE) ×1
GOWN STRL REUS W/TWL XL LVL3 (GOWN DISPOSABLE) ×1
GUIDEWIRE STR DUAL SENSOR (WIRE) ×5 IMPLANT
IV NS IRRIG 3000ML ARTHROMATIC (IV SOLUTION) ×3 IMPLANT
KIT TURNOVER CYSTO (KITS) ×3 IMPLANT
PACK CYSTO AR (MISCELLANEOUS) ×3 IMPLANT
SET CYSTO W/LG BORE CLAMP LF (SET/KITS/TRAYS/PACK) ×3 IMPLANT
STENT URET 6FRX24 CONTOUR (STENTS) ×2 IMPLANT
SURGILUBE 2OZ TUBE FLIPTOP (MISCELLANEOUS) ×3 IMPLANT
SYR 10ML LL (SYRINGE) ×3 IMPLANT
WATER STERILE IRR 500ML POUR (IV SOLUTION) ×3 IMPLANT

## 2021-11-12 NOTE — Op Note (Signed)
Date of procedure: 11/12/21  Preoperative diagnosis:  Right renal stone  Postoperative diagnosis:  Same  Procedure: Cystoscopy, right ureteroscopy, laser lithotripsy, right retrograde pyelogram with intraoperative interpretation, right ureteral stent placement  Surgeon: Nickolas Madrid, MD  Anesthesia: General  Complications: None  Intraoperative findings:  Normal cystoscopy Large 2 cm right UPJ stone dusted Uncomplicated stent placement  EBL: Minimal  Specimens: None  Drains: Right 6 French by 24 cm ureteral stent  Indication: Phyllis Ross is a 86 y.o. patient with large 2 cm right renal stone with intermittent obstruction who opted for ureteroscopy.  Previously was stented for infection..  After reviewing the management options for treatment, they elected to proceed with the above surgical procedure(s). We have discussed the potential benefits and risks of the procedure, side effects of the proposed treatment, the likelihood of the patient achieving the goals of the procedure, and any potential problems that might occur during the procedure or recuperation. Informed consent has been obtained.  Description of procedure:  The patient was taken to the operating room and general anesthesia was induced. SCDs were placed for DVT prophylaxis.. The patient was placed in the dorsal lithotomy position, prepped and draped in the usual sterile fashion, and preoperative antibiotics(Ancef) were administered. A preoperative time-out was performed.   A 21 French rigid cystoscope was used to intubate the urethra and thorough cystoscopy was performed.  The bladder was grossly normal.  The right ureteral stent was grasped and pulled to the meatus, and a sensor wire advanced through the stent up to the kidney under fluoroscopic vision.  The stone could clearly be seen in the kidney.  A dual-lumen ureteral access catheter was advanced over the wire to the proximal ureter and a second safety wire  was added.  A 12/14 French ureteral access sheath advanced easily over the wire to the proximal ureter.  This digital single-channel flexible ureteroscope was advanced through the sheath and a large 2 cm black stone was lodged at the UPJ.  A 200 m laser fiber on settings of 0.6 J and 80 Hz was used to methodically dust the stone.  This took approximately 1 hour.  Careful inspection of all calyces revealed no other significant stone fragments greater than 1 mm, and there was extensive stone dust within the kidney.  Numerous stone fragments had been passed through the ureteral access sheath, and these were sent for stone analysis.  A retrograde pyelogram was performed from the proximal ureter and showed no extravasation or filling defects.  Careful pullback ureteroscopy showed no ureteral injury or residual stone fragments.  The rigid cystoscope was backloaded over the wire and a 6 [place] by 24 cm ureteral stent was placed uneventfully with a curl in the upper pole as well as in the bladder under direct vision.  The bladder was drained and this concluded our procedure.  Disposition: Stable to PACU  Plan: Low-dose Keflex prophylaxis with stent in Stent removal in clinic in 2 to 3 weeks Follow-up stone analysis  Nickolas Madrid, MD

## 2021-11-12 NOTE — Transfer of Care (Signed)
Immediate Anesthesia Transfer of Care Note  Patient: Phyllis Ross  Procedure(s) Performed: CYSTOSCOPY WITH RETROGRADE PYELOGRAM / URETEROSCOPY/HOLMIUM LASER/STENT EXCHANGE (Right: Ureter)  Patient Location: PACU  Anesthesia Type:General  Level of Consciousness: drowsy  Airway & Oxygen Therapy: Patient Spontanous Breathing and Patient connected to face mask oxygen  Post-op Assessment: Report given to RN and Post -op Vital signs reviewed and stable  Post vital signs: Reviewed and stable  Last Vitals:  Vitals Value Taken Time  BP 163/68 11/12/21 1445  Temp 36.1 1445  Pulse 60 11/12/21 1448  Resp 16 11/12/21 1448  SpO2 100 % 11/12/21 1448  Vitals shown include unvalidated device data.  Last Pain:  Vitals:   11/12/21 1022  TempSrc: Temporal  PainSc: 0-No pain         Complications: No notable events documented.

## 2021-11-12 NOTE — Anesthesia Preprocedure Evaluation (Addendum)
Anesthesia Evaluation  Patient identified by MRN, date of birth, ID band Patient awake    Reviewed: Allergy & Precautions, NPO status , Patient's Chart, lab work & pertinent test results  Airway Mallampati: III  TM Distance: <3 FB Neck ROM: Limited    Dental  (+) Teeth Intact   Pulmonary neg pulmonary ROS,    Pulmonary exam normal breath sounds clear to auscultation       Cardiovascular Exercise Tolerance: Good + CAD, + Past MI and + CABG  Normal cardiovascular exam+ Valvular Problems/Murmurs MR  Rhythm:Regular     Neuro/Psych negative neurological ROS  negative psych ROS   GI/Hepatic Neg liver ROS, s/p RIGHT hemicolectomy + oral capecitabine   Endo/Other  negative endocrine ROS  Renal/GU CRFRenal disease     Musculoskeletal   Abdominal Normal abdominal exam  (+)   Peds negative pediatric ROS (+)  Hematology  (+) Blood dyscrasia, anemia , Beta Thalassemia   Anesthesia Other Findings Past Medical History: 06/2020: Adenocarcinoma of cecum (HCC)     Comment:  a.) stage IIIc (G2, (+) LVI, (-) PNI, T3N3BMX) --> s/p               RIGHT hemicolectomy + oral capecitabine No date: Anemia No date: Anxiety     Comment:  a.) on BZO (alprazolam) PRN No date: Aortic atherosclerosis (HCC) No date: Beta thalassemia (HCC) No date: Bradycardia 08/14/2006: CAD (coronary artery disease)     Comment:  a.) STEMI 08/14/2006 --> LHC at [facility] --> EF 45%, 95%               mLAD, 95% OM1, and 75% LV groove artery --> transfer to               [facility] for CVTS consult; b.) 3v CABG 08/14/2006 No date: History of kidney stones No date: Hyperlipidemia No date: Hypertension No date: Osteoporosis No date: Ovarian abscess 2005: Ovarian cancer ([place])     Comment:  a.) stage IIIb adenocarcinoma --> s/p CRS 2005 + 6               cycles of carboplatin/paclitaxel No date: Pneumonia     Comment:  as a baby No date: Rheumatic fever 08/14/2006:  S/P CABG x 3     Comment:  a.) LIMA-LAD, SVG-RI, SVG-OM1 No date: Skin cancer 08/14/2006: STEMI (ST elevation myocardial infarction) ([place])     Comment:  a.) LHC at [facility] --> transferred to [facility] for emergent               CABG  Past Surgical History: 04/04/2009: COLONOSCOPY     Comment:  normal- Dr Vira Agar 06/04/2020: COLONOSCOPY WITH PROPOFOL; N/A     Comment:  Procedure: COLONOSCOPY WITH PROPOFOL;  Surgeon: Lucilla Lame, MD;  Location: [facility];  Service:               Endoscopy;  Laterality: N/A; 08/14/2006: CORONARY ARTERY BYPASS GRAFT; N/A     Comment:  Procedure: CORONARY ARTERY BYPASS GRAFT; Location: [facility];              Surgeon: Walker Shadow, MD 06/04/2020: ESOPHAGOGASTRODUODENOSCOPY (EGD) WITH PROPOFOL; N/A     Comment:  Procedure: ESOPHAGOGASTRODUODENOSCOPY (EGD) WITH               PROPOFOL;  Surgeon: Lucilla Lame, MD;  Location: [facility];  Service: Endoscopy;  Laterality: N/A; No date: FRACTURE SURGERY 08/14/2006: LEFT HEART CATH AND CORONARY ANGIOGRAPHY; Left     Comment:  Procedure: LEFT HEART CATHETERIZATION AND CORONARY               ANGIOGRAPHY; Location: [facility]: Surgeon: Katrine Coho, MD No date: VAGINAL HYSTERECTOMY     Comment:  DUB  BMI    Body Mass Index: 18.14 kg/m      Reproductive/Obstetrics negative OB ROS                           Anesthesia Physical  Anesthesia Plan  ASA: 3  Anesthesia Plan: General   Post-op Pain Management: Minimal or no pain anticipated   Induction: Intravenous  PONV Risk Score and Plan: Ondansetron and Dexamethasone  Airway Management Planned: Oral ETT  Additional Equipment:   Intra-op Plan:   Post-operative Plan: Extubation in OR  Informed Consent: I have reviewed the patients History and Physical, chart, labs and discussed the procedure including the risks, benefits and alternatives for the proposed anesthesia with the patient or authorized  representative who has indicated his/her understanding and acceptance.     Dental Advisory Given  Plan Discussed with: CRNA and Surgeon  Anesthesia Plan Comments:       Anesthesia Quick Evaluation

## 2021-11-12 NOTE — H&P (Signed)
11/12/21 1:02 PM   Charlann Boxer May 08, 1935 [id number]  CC: Right renal stone  HPI: Large 2 cm right renal stone, risk and benefits of ureteroscopy versus PCNL discussed at length, and family opted for ureteroscopy with understanding that staged procedure may be required.  Previously was stented when found to have purulent drainage from kidney after wire placement 2 weeks ago.  Has been on Keflex, culture ultimately no growth.   PMH: Past Medical History:  Diagnosis Date   Adenocarcinoma of cecum ([place]) 06/2020   a.) stage IIIc (G2, (+) LVI, (-) PNI, T3N3BMX) --> s/p RIGHT hemicolectomy + oral capecitabine   Anemia    Anxiety    a.) on BZO (alprazolam) PRN   Aortic atherosclerosis (HCC)    Arthritis    Beta thalassemia (HCC)    Bradycardia    CAD (coronary artery disease) 08/14/2006   a.) STEMI 08/14/2006 --> LHC at [facility] --> EF 45%, 95% mLAD, 95% OM1, and 75% LV groove artery --> transfer to [facility] for CVTS consult; b.) 3v CABG 08/14/2006   Chronic kidney disease    History of kidney stones    Hyperlipidemia    Hypertension    Osteoporosis    Ovarian abscess    Ovarian cancer ([place]) 2005   a.) stage IIIb adenocarcinoma --> s/p CRS 2005 + 6 cycles of carboplatin/paclitaxel   Pneumonia    as a baby   Rheumatic fever    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   a.) LIMA-LAD, SVG-RI, SVG-OM1   Skin cancer    STEMI (ST elevation myocardial infarction) ([place]) 08/14/2006   a.) LHC at [facility] --> transferred to [facility] for emergent CABG    Surgical History: Past Surgical History:  Procedure Laterality Date   COLONOSCOPY  04/04/2009   normal- Dr Vira Agar   COLONOSCOPY WITH PROPOFOL N/A 06/04/2020   Procedure: COLONOSCOPY WITH PROPOFOL;  Surgeon: Lucilla Lame, MD;  Location: [place] [facility];  Service: Endoscopy;  Laterality: N/A;   CORONARY ARTERY BYPASS GRAFT N/A 08/14/2006   Procedure: CORONARY ARTERY BYPASS GRAFT; Location: [facility]; Surgeon: Walker Shadow, MD   CYSTOSCOPY/URETEROSCOPY/HOLMIUM  LASER/STENT PLACEMENT Right 10/29/2021   Procedure: CYSTOSCOPY/ RIGHT STENT PLACEMENT;  Surgeon: Billey Co, MD;  Location: [facility];  Service: Urology;  Laterality: Right;   ESOPHAGOGASTRODUODENOSCOPY (EGD) WITH PROPOFOL N/A 06/04/2020   Procedure: ESOPHAGOGASTRODUODENOSCOPY (EGD) WITH PROPOFOL;  Surgeon: Lucilla Lame, MD;  Location: [facility];  Service: Endoscopy;  Laterality: N/A;   FRACTURE SURGERY     ankle left   LEFT HEART CATH AND CORONARY ANGIOGRAPHY Left 08/14/2006   Procedure: LEFT HEART CATHETERIZATION AND CORONARY ANGIOGRAPHY; Location: [facility]: Surgeon: Katrine Coho, MD   VAGINAL HYSTERECTOMY     DUB     Family History: Family History  Problem Relation Age of Onset   Heart attack Mother    Heart attack Maternal Uncle    Heart attack Maternal Uncle     Social History:  reports that she has never smoked. She has never been exposed to tobacco smoke. She has never used smokeless tobacco. She reports that she does not drink alcohol and does not use drugs.  Physical Exam: BP 129/74   Pulse 73   Temp (!) 97.4 F (36.3 C) (Temporal)   Resp 18   Ht '5\' 5"'$  (1.651 m)   Wt 49.9 kg   SpO2 100%   BMI 18.30 kg/m    Constitutional:  Alert and oriented, No acute distress. Cardiovascular: Regular rate and rhythm Respiratory: Clear to  auscultation bilaterally GI: Abdomen is soft, nontender, nondistended, no abdominal masses   Laboratory Data: Urine culture 10/28/2021 no growth  Assessment & Plan:   Comorbid 86 year old female with large 2 cm right renal stone, opted for ureteroscopy and understands the need for possible staged procedures, and deferred PCNL.  Previously stented when found to have purulent drainage from collecting system after wire placement, and has been on Keflex, urine culture ultimately no growth.  We specifically discussed the risks ureteroscopy including bleeding, infection/sepsis, stent related symptoms including flank  pain/urgency/frequency/incontinence/dysuria, ureteral injury, inability to access stone, or need for staged or additional procedures.  Right ureteroscopy, laser lithotripsy, stent placement   Nickolas Madrid, MD 11/12/2021  [place] [address] [place], [place] [address] [phone]

## 2021-11-12 NOTE — Discharge Instructions (Signed)

## 2021-11-12 NOTE — Anesthesia Procedure Notes (Signed)
Procedure Name: Intubation Date/Time: 11/12/2021 1:22 PM  Performed by: Cammie Sickle, CRNAPre-anesthesia Checklist: Patient identified, Patient being monitored, Timeout performed, Emergency Drugs available and Suction available Patient Re-evaluated:Patient Re-evaluated prior to induction Oxygen Delivery Method: Circle system utilized Preoxygenation: Pre-oxygenation with 100% oxygen Induction Type: IV induction Ventilation: Mask ventilation without difficulty Laryngoscope Size: 3 and McGraph Grade View: Grade I Tube type: Oral Tube size: 6.5 mm Number of attempts: 1 Airway Equipment and Method: Stylet Placement Confirmation: ETT inserted through vocal cords under direct vision, positive ETCO2 and breath sounds checked- equal and bilateral Secured at: 21 cm Tube secured with: Tape Dental Injury: Teeth and Oropharynx as per pre-operative assessment

## 2021-11-15 ENCOUNTER — Encounter: Payer: Self-pay | Admitting: Urology

## 2021-11-15 NOTE — Anesthesia Postprocedure Evaluation (Signed)
Anesthesia Post Note  Patient: Phyllis Ross  Procedure(s) Performed: CYSTOSCOPY WITH RETROGRADE PYELOGRAM / RIGHT URETEROSCOPY/HOLMIUM LASER/RIGHT STENT EXCHANGE (Right: Ureter)  Patient location during evaluation: PACU Anesthesia Type: General Level of consciousness: awake and alert Pain management: pain level controlled Vital Signs Assessment: post-procedure vital signs reviewed and stable Respiratory status: spontaneous breathing, nonlabored ventilation and respiratory function stable Cardiovascular status: blood pressure returned to baseline and stable Postop Assessment: no apparent nausea or vomiting Anesthetic complications: no   No notable events documented.   Last Vitals:  Vitals:   11/12/21 1545 11/12/21 1556  BP: (!) 164/75 (!) 162/75  Pulse: (!) 59 (!) 58  Resp: 19 18  Temp: (!) 36.1 C 37.2 C  SpO2: 100% 99%    Last Pain:  Vitals:   11/12/21 1556  TempSrc: Temporal  PainSc: [place]

## 2021-11-16 ENCOUNTER — Telehealth: Payer: Self-pay

## 2021-11-16 ENCOUNTER — Other Ambulatory Visit: Payer: Self-pay | Admitting: Oncology

## 2021-11-16 DIAGNOSIS — E876 Hypokalemia: Secondary | ICD-10-CM

## 2021-11-16 NOTE — Telephone Encounter (Signed)
-----   Message from Lucilla Lame, MD sent at 10/31/2021  9:42 AM EDT ----- Please set this patient up for a colonoscopy due to a history of colon cancer. Thanks ----- Message ----- From: Sindy Guadeloupe, MD Sent: 10/19/2021   4:19 PM EDT To: Juline Patch, MD; Lucilla Lame, MD

## 2021-11-16 NOTE — Telephone Encounter (Signed)
Pt stated that she is not feeling well at this time and does not want to schedule. She will call me when she wants to schedule

## 2021-11-16 NOTE — Telephone Encounter (Signed)
Component Ref Range & Units 4 wk ago (10/19/21) 5 mo ago (06/17/21) 8 mo ago (03/18/21) 8 mo ago (03/08/21) 9 mo ago (02/05/21) 10 mo ago (01/14/21) 10 mo ago (12/29/20)  Potassium 3.5 - 5.1 mmol/L 4.2  4.0  3.7   3.2 Low   3.7  3.6

## 2021-11-17 ENCOUNTER — Encounter: Payer: Self-pay | Admitting: Oncology

## 2021-11-19 LAB — CALCULI, WITH PHOTOGRAPH (CLINICAL LAB)
Calcium Oxalate Monohydrate: 100 %
Weight Calculi: 21 mg

## 2021-11-24 ENCOUNTER — Ambulatory Visit: Payer: Medicare PPO

## 2021-11-25 ENCOUNTER — Ambulatory Visit (INDEPENDENT_AMBULATORY_CARE_PROVIDER_SITE_OTHER): Payer: Medicare PPO

## 2021-11-25 DIAGNOSIS — E538 Deficiency of other specified B group vitamins: Secondary | ICD-10-CM | POA: Diagnosis not present

## 2021-11-25 MED ORDER — CYANOCOBALAMIN 1000 MCG/ML IJ SOLN
1000.0000 ug | Freq: Once | INTRAMUSCULAR | Status: AC
  Administered 2021-11-25: 1000 ug via INTRAMUSCULAR

## 2021-11-30 ENCOUNTER — Encounter: Payer: Self-pay | Admitting: Urology

## 2021-11-30 ENCOUNTER — Ambulatory Visit (INDEPENDENT_AMBULATORY_CARE_PROVIDER_SITE_OTHER): Payer: Medicare PPO | Admitting: Urology

## 2021-11-30 ENCOUNTER — Other Ambulatory Visit
Admission: RE | Admit: 2021-11-30 | Discharge: 2021-11-30 | Disposition: A | Discharge status: Home / Self Care | Payer: Medicare PPO | Attending: Urology | Admitting: Urology

## 2021-11-30 ENCOUNTER — Other Ambulatory Visit: Payer: Self-pay | Admitting: *Deleted

## 2021-11-30 VITALS — BP 121/76 | HR 71 | Ht 65.0 in | Wt 110.0 lb

## 2021-11-30 DIAGNOSIS — N200 Calculus of kidney: Secondary | ICD-10-CM | POA: Insufficient documentation

## 2021-11-30 LAB — URINALYSIS, COMPLETE (UACMP) WITH MICROSCOPIC
Bilirubin Urine: NEGATIVE
Glucose, UA: NEGATIVE mg/dL
Ketones, ur: NEGATIVE mg/dL
Nitrite: NEGATIVE
Protein, ur: 30 mg/dL — AB
Specific Gravity, Urine: <1.005 — ABNORMAL LOW (ref 1.005–1.030)
pH: 6 (ref 5.0–8.0)

## 2021-11-30 MED ORDER — TAMSULOSIN HCL 0.4 MG PO CAPS: 0.4000 mg | ORAL_CAPSULE | Freq: Every day | ORAL | 0 refills | Status: AC

## 2021-11-30 NOTE — Progress Notes (Signed)
Cystoscopy Procedure Note:  Indication: Stent removal s/p 11/12/2021 right ureteroscopy, laser lithotripsy for 2 cm right renal stone  After informed consent and discussion of the procedure and its risks, Phyllis Ross was positioned and prepped in the standard fashion. Cystoscopy was performed with a flexible cystoscope. The stent was grasped with flexible graspers and removed in its entirety. The patient tolerated the procedure well.  Findings: Uncomplicated stent removal  Assessment and Plan: Bactrim DS given for prophylaxis Flomax x10 days secondary to large stone burden RTC 6 months KUB prior  Billey Co, MD 11/30/2021

## 2021-12-14 DIAGNOSIS — I700 Atherosclerosis of aorta: Secondary | ICD-10-CM | POA: Diagnosis not present

## 2021-12-14 DIAGNOSIS — E782 Mixed hyperlipidemia: Secondary | ICD-10-CM | POA: Diagnosis not present

## 2021-12-14 DIAGNOSIS — I340 Nonrheumatic mitral (valve) insufficiency: Secondary | ICD-10-CM | POA: Diagnosis not present

## 2021-12-14 DIAGNOSIS — I10 Essential (primary) hypertension: Secondary | ICD-10-CM | POA: Diagnosis not present

## 2021-12-14 DIAGNOSIS — I25810 Atherosclerosis of coronary artery bypass graft(s) without angina pectoris: Secondary | ICD-10-CM | POA: Diagnosis not present

## 2021-12-14 DIAGNOSIS — I071 Rheumatic tricuspid insufficiency: Secondary | ICD-10-CM | POA: Diagnosis not present

## 2021-12-23 ENCOUNTER — Ambulatory Visit: Payer: Medicare PPO

## 2021-12-27 ENCOUNTER — Ambulatory Visit (INDEPENDENT_AMBULATORY_CARE_PROVIDER_SITE_OTHER): Payer: Medicare PPO

## 2021-12-27 DIAGNOSIS — E538 Deficiency of other specified B group vitamins: Secondary | ICD-10-CM | POA: Diagnosis not present

## 2021-12-27 MED ORDER — CYANOCOBALAMIN 1000 MCG/ML IJ SOLN
1000.0000 ug | Freq: Once | INTRAMUSCULAR | Status: AC
  Administered 2021-12-27: 1000 ug via INTRAMUSCULAR

## 2021-12-28 ENCOUNTER — Other Ambulatory Visit: Payer: Self-pay

## 2021-12-28 ENCOUNTER — Other Ambulatory Visit: Payer: Self-pay | Admitting: Family Medicine

## 2021-12-28 DIAGNOSIS — E538 Deficiency of other specified B group vitamins: Secondary | ICD-10-CM

## 2021-12-28 DIAGNOSIS — D509 Iron deficiency anemia, unspecified: Secondary | ICD-10-CM

## 2021-12-28 MED ORDER — FERROUS SULFATE 325 (65 FE) MG PO TABS: 325.0000 mg | ORAL_TABLET | Freq: Every day | ORAL | 0 refills | Status: DC

## 2021-12-28 MED ORDER — VITAMIN B-12 1000 MCG PO TABS: 1000.0000 ug | ORAL_TABLET | Freq: Every day | ORAL | 0 refills | Status: DC

## 2021-12-28 NOTE — Telephone Encounter (Signed)
Medication Refill - Medication: ferrous sulfate 325 (65 FE) MG tablet, vitamin B-12 (CYANOCOBALAMIN) 1000 MCG tablet  Has the patient contacted their pharmacy? Yes.     Preferred Pharmacy (with phone number or street name):  [facility], [place] Phone:  [phone]  Fax:  [phone]     Has the patient been seen for an appointment in the last year OR does the patient have an upcoming appointment? Yes.    Drew at [facility] would like a couple of the med list sent to him as well. Please assist further

## 2021-12-28 NOTE — Telephone Encounter (Signed)
rxs were sent to pharmacy by provider.   Requested Prescriptions  Pending Prescriptions Disp Refills  . ferrous sulfate 325 (65 FE) MG tablet 90 tablet 0    Sig: Take 1 tablet (325 mg total) by mouth daily with breakfast.     Endocrinology:  Minerals - Iron Supplementation Failed - 12/28/2021  3:52 PM      Failed - HGB in normal range and within 360 days    Hemoglobin  Date Value Ref Range Status  10/19/2021 10.5 (L) 12.0 - 15.0 g/dL Final    Comment:    Reticulocyte Hemoglobin testing may be clinically indicated, consider ordering this additional test [id number]   04/07/2020 10.9 (L) 11.1 - 15.9 g/dL Final         Failed - HCT in normal range and within 360 days    HCT  Date Value Ref Range Status  10/19/2021 33.5 (L) 36.0 - 46.0 % Final   Hematocrit  Date Value Ref Range Status  04/07/2020 37.3 34.0 - 46.6 % Final         Failed - Fe (serum) in normal range and within 360 days    Iron  Date Value Ref Range Status  12/29/2020 85 28 - 170 ug/dL Final   Saturation Ratios  Date Value Ref Range Status  12/29/2020 22 10.4 - 31.8 % Final         Failed - Ferritin in normal range and within 360 days    Ferritin  Date Value Ref Range Status  12/29/2020 84 11 - 307 ng/mL Final    Comment:    Performed at [facility], [address] [place], [place] [address]         Passed - RBC in normal range and within 360 days    RBC  Date Value Ref Range Status  10/19/2021 4.81 3.87 - 5.11 MIL/uL Final         Passed - Valid encounter within last 12 months    Recent Outpatient Visits          2 months ago Burning with urination   [place] [facility] [place], [place], MD   2 months ago Acute cystitis with hematuria   [place] [facility] at [place], [place], MD   2 months ago Flank pain   [place] [facility] at [facility], Jesse Sans, MD   2  months ago Familial multiple lipoprotein-type hyperlipidemia   [place] [facility] at [place], [place], MD   7 months ago Bronchitis, allergic, unspecified asthma severity, with acute exacerbation   [place] [facility] at [place], [place], MD      Future Appointments            In 3 months Juline Patch, MD [facility] at [facility], [facility]   In 5 months [facility] [place], Herbert Seta, MD [place]           . cyanocobalamin (VITAMIN B12) 1000 MCG tablet 90 tablet 0    Sig: Take 1 tablet (1,000 mcg total) by mouth daily.     Endocrinology:  Vitamins - Vitamin B12 Failed - 12/28/2021  3:52 PM      Failed - HCT in normal range and within 360 days    HCT  Date Value Ref  Range Status  10/19/2021 33.5 (L) 36.0 - 46.0 % Final   Hematocrit  Date Value Ref Range Status  04/07/2020 37.3 34.0 - 46.6 % Final         Failed - HGB in normal range and within 360 days    Hemoglobin  Date Value Ref Range Status  10/19/2021 10.5 (L) 12.0 - 15.0 g/dL Final    Comment:    Reticulocyte Hemoglobin testing may be clinically indicated, consider ordering this additional test [id number]   04/07/2020 10.9 (L) 11.1 - 15.9 g/dL Final         Failed - B12 Level in normal range and within 360 days    Vitamin B-12  Date Value Ref Range Status  10/28/2020 412 180 - 914 pg/mL Final    Comment:    (NOTE) This assay is not validated for testing neonatal or myeloproliferative syndrome specimens for Vitamin B12 levels. Performed at [facility], [place] [address] [place], [place] [address]          Passed - Valid encounter within last 12 months    Recent Outpatient Visits          2 months ago Burning with urination   [place] [facility] at [place], [place], MD   2 months ago Acute cystitis with hematuria   [place]  [facility] at [place], Deanna C, MD   2 months ago Flank pain   [place] [facility] at [facility], Jesse Sans, MD   2 months ago Familial multiple lipoprotein-type hyperlipidemia   [place] [facility] at [place], [place], MD   7 months ago Bronchitis, allergic, unspecified asthma severity, with acute exacerbation   [place] [facility] at [facility], MD      Future Appointments            In 3 months Juline Patch, MD [facility] Primary Care and [facility] Medicine at [facility], [facility]   In 5 months Diamantina [place], Herbert Seta, [place]

## 2022-01-06 ENCOUNTER — Telehealth: Payer: Self-pay | Admitting: *Deleted

## 2022-01-06 ENCOUNTER — Other Ambulatory Visit: Payer: Self-pay | Admitting: *Deleted

## 2022-01-06 DIAGNOSIS — Z85038 Personal history of other malignant neoplasm of large intestine: Secondary | ICD-10-CM

## 2022-01-06 MED ORDER — PEG 3350-KCL-NABCB-NACL-NASULF 236 G PO SOLR: 4000.0000 mL | Freq: Once | ORAL | 0 refills | Status: AC

## 2022-01-06 NOTE — Telephone Encounter (Addendum)
Gastroenterology Pre-Procedure Review  Request Date: 01/31/2022 Requesting Physician: Dr. Allen Norris  PATIENT REVIEW QUESTIONS: The patient responded to the following health history questions as indicated:    1. Are you having any GI issues? no 2. Do you have a personal history of Polyps? Yes (history of colon cancer, last colonoscopy 06/04/2020) 3. Do you have a family history of Colon Cancer or Polyps? no 4. Diabetes Mellitus? no 5. Joint replacements in the past 12 months?no 6. Major health problems in the past 3 months?no 7. Any artificial heart valves, MVP, or defibrillator? no    MEDICATIONS & ALLERGIES:    Patient reports the following regarding taking any anticoagulation/antiplatelet therapy:   Plavix, Coumadin, Eliquis, Xarelto, Lovenox, Pradaxa, Brilinta, or Effient? no Aspirin? no  Patient confirms/reports the following medications:  Current Outpatient Medications  Medication Sig Dispense Refill   atorvastatin (LIPITOR) 80 MG tablet TAKE (1) TABLET BY MOUTH EVERY DAY 90 tablet 0   Cyanocobalamin (VITAMIN B-12 IJ) Inject 1,000 vials as directed every 30 (thirty) days. Patient Not sure of dosage     cyanocobalamin (VITAMIN B12) 1000 MCG tablet Take 1 tablet (1,000 mcg total) by mouth daily. 90 tablet 0   ferrous sulfate 325 (65 FE) MG tablet Take 1 tablet (325 mg total) by mouth daily with breakfast. 90 tablet 0   montelukast (SINGULAIR) 10 MG tablet Take 1 tablet (10 mg total) by mouth at bedtime. 30 tablet 3   Nutritional Supplements (ENSURE COMPLETE PO) Take 1 Can by mouth daily.     potassium chloride SA (KLOR-CON M) 20 MEQ tablet TAKE (1) TABLET BY MOUTH EVERY DAY 30 tablet 2   No current facility-administered medications for this visit.    Patient confirms/reports the following allergies:  Allergies  Allergen Reactions   Trazodone And Nefazodone Other (See Comments)    Made her pass out    No orders of the defined types were placed in this encounter.   AUTHORIZATION  INFORMATION Primary Insurance: 1D#: Group #:  Secondary Insurance: 1D#: Group #:  SCHEDULE INFORMATION: Date: 02/01/2024 Time: Location: [place]

## 2022-01-10 DIAGNOSIS — H00021 Hordeolum internum right upper eyelid: Secondary | ICD-10-CM | POA: Diagnosis not present

## 2022-01-10 DIAGNOSIS — Z961 Presence of intraocular lens: Secondary | ICD-10-CM | POA: Diagnosis not present

## 2022-01-10 DIAGNOSIS — H52223 Regular astigmatism, bilateral: Secondary | ICD-10-CM | POA: Diagnosis not present

## 2022-01-10 DIAGNOSIS — H10231 Serous conjunctivitis, except viral, right eye: Secondary | ICD-10-CM | POA: Diagnosis not present

## 2022-01-10 DIAGNOSIS — H43813 Vitreous degeneration, bilateral: Secondary | ICD-10-CM | POA: Diagnosis not present

## 2022-01-10 DIAGNOSIS — H524 Presbyopia: Secondary | ICD-10-CM | POA: Diagnosis not present

## 2022-01-10 DIAGNOSIS — H5203 Hypermetropia, bilateral: Secondary | ICD-10-CM | POA: Diagnosis not present

## 2022-01-11 ENCOUNTER — Telehealth: Payer: Self-pay | Admitting: *Deleted

## 2022-01-11 NOTE — Telephone Encounter (Signed)
Received clearance from patient's cardiology office. Patient have been notified. Patient verbalized understanding.

## 2022-01-12 ENCOUNTER — Other Ambulatory Visit: Payer: Self-pay

## 2022-01-12 MED ORDER — SUTAB 1479-225-188 MG PO TABS: 1.0000 | ORAL_TABLET | ORAL | 0 refills | Status: DC

## 2022-01-14 ENCOUNTER — Other Ambulatory Visit: Payer: Self-pay | Admitting: Family Medicine

## 2022-01-14 DIAGNOSIS — Z789 Other specified health status: Secondary | ICD-10-CM | POA: Diagnosis not present

## 2022-01-14 DIAGNOSIS — D0439 Carcinoma in situ of skin of other parts of face: Secondary | ICD-10-CM | POA: Diagnosis not present

## 2022-01-14 DIAGNOSIS — J45901 Unspecified asthma with (acute) exacerbation: Secondary | ICD-10-CM

## 2022-01-14 DIAGNOSIS — R208 Other disturbances of skin sensation: Secondary | ICD-10-CM | POA: Diagnosis not present

## 2022-01-14 DIAGNOSIS — C44319 Basal cell carcinoma of skin of other parts of face: Secondary | ICD-10-CM | POA: Diagnosis not present

## 2022-01-14 DIAGNOSIS — D485 Neoplasm of uncertain behavior of skin: Secondary | ICD-10-CM | POA: Diagnosis not present

## 2022-01-14 DIAGNOSIS — L538 Other specified erythematous conditions: Secondary | ICD-10-CM | POA: Diagnosis not present

## 2022-01-14 DIAGNOSIS — L820 Inflamed seborrheic keratosis: Secondary | ICD-10-CM | POA: Diagnosis not present

## 2022-01-17 ENCOUNTER — Ambulatory Visit: Payer: Medicare PPO | Admitting: Family Medicine

## 2022-01-17 ENCOUNTER — Encounter: Payer: Self-pay | Admitting: Family Medicine

## 2022-01-17 VITALS — BP 120/88 | HR 68 | Ht 65.0 in | Wt 109.0 lb

## 2022-01-17 DIAGNOSIS — B3731 Acute candidiasis of vulva and vagina: Secondary | ICD-10-CM

## 2022-01-17 DIAGNOSIS — R309 Painful micturition, unspecified: Secondary | ICD-10-CM | POA: Diagnosis not present

## 2022-01-17 LAB — POCT URINALYSIS DIPSTICK
Bilirubin, UA: NEGATIVE
Blood, UA: NEGATIVE
Glucose, UA: NEGATIVE
Ketones, UA: NEGATIVE
Leukocytes, UA: NEGATIVE
Nitrite, UA: NEGATIVE
Protein, UA: NEGATIVE
Spec Grav, UA: 1.01 (ref 1.010–1.025)
Urobilinogen, UA: 0.2 E.U./dL
pH, UA: 6 (ref 5.0–8.0)

## 2022-01-17 MED ORDER — FLUCONAZOLE 150 MG PO TABS: 150.0000 mg | ORAL_TABLET | Freq: Once | ORAL | 0 refills | Status: AC

## 2022-01-17 MED ORDER — NYSTATIN 100000 UNIT/GM EX CREA: 1.0000 | TOPICAL_CREAM | Freq: Two times a day (BID) | CUTANEOUS | 0 refills | Status: DC

## 2022-01-17 NOTE — Progress Notes (Signed)
Date:  01/17/2022   Name:  Phyllis Ross   DOB:  1935-04-23   MRN:  [id number]   Chief Complaint: handicap placard and Urinary Tract Infection (Check urine sample- may have a yeast infection from amoxil)  Urinary Tract Infection  This is a new problem. The current episode started yesterday. The problem occurs every urination. The problem has been gradually worsening. The quality of the pain is described as burning. The pain is mild. There has been no fever. Pertinent negatives include no chills, discharge, frequency, hematuria, nausea, sweats or urgency. She has tried antibiotics (amoxil) for the symptoms.    Lab Results  Component Value Date   NA 139 10/19/2021   K 4.2 10/19/2021   CO2 27 10/19/2021   GLUCOSE 95 10/19/2021   BUN 21 10/19/2021   CREATININE 1.03 (H) 10/19/2021   CALCIUM 9.4 10/19/2021   GFRNONAA 54 (L) 10/19/2021   Lab Results  Component Value Date   CHOL 118 10/07/2020   HDL 43 10/07/2020   LDLCALC 53 10/07/2020   TRIG 109 10/07/2020   CHOLHDL 2.7 10/07/2020   No results found for: "TSH" Lab Results  Component Value Date   HGBA1C 5.5 04/07/2020   Lab Results  Component Value Date   WBC 8.2 10/19/2021   HGB 10.5 (L) 10/19/2021   HCT 33.5 (L) 10/19/2021   MCV 69.6 (L) 10/19/2021   PLT 221 10/19/2021   Lab Results  Component Value Date   ALT 54 (H) 10/19/2021   AST 31 10/19/2021   ALKPHOS 56 10/19/2021   BILITOT 1.6 (H) 10/19/2021   No results found for: "25OHVITD2", "25OHVITD3", "VD25OH"   Review of Systems  Constitutional:  Negative for chills and fever.  HENT:  Negative for drooling, ear discharge, ear pain and sore throat.   Respiratory:  Negative for cough, shortness of breath and wheezing.   Cardiovascular:  Negative for chest pain, palpitations and leg swelling.  Gastrointestinal:  Negative for abdominal pain, blood in stool, constipation, diarrhea and nausea.  Endocrine: Negative for polydipsia.  Genitourinary:  Negative for  dysuria, frequency, hematuria and urgency.  Musculoskeletal:  Negative for back pain, myalgias and neck pain.  Skin:  Negative for rash.  Allergic/Immunologic: Negative for environmental allergies.  Neurological:  Negative for dizziness and headaches.  Hematological:  Does not bruise/bleed easily.  Psychiatric/Behavioral:  Negative for suicidal ideas. The patient is not nervous/anxious.     Patient Active Problem List   Diagnosis Date Noted   B12 deficiency 09/16/2020   Goals of care, counseling/discussion 08/07/2020   Microcytic anemia 07/23/2020   Status post partial colectomy 07/02/2020   Protein-calorie malnutrition, severe 06/24/2020   Cecal cancer ([place]) 06/15/2020   Iron deficiency anemia due to chronic blood loss    Neoplasm of lower gastrointestinal tract    Acute gastritis with hemorrhage    Personal history of other malignant neoplasm of skin 01/21/2019   Atherosclerosis of abdominal aorta ([place]) 12/26/2017   Age-related osteoporosis without current pathological fracture 06/16/2016   Encounter for follow-up surveillance of ovarian cancer 05/20/2015   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   Wedging of vertebra ([place]) 10/13/2014   Polypharmacy 10/13/2014   Encounter for general adult medical examination without abnormal findings 10/13/2014   Anxiety 10/13/2014   Essential hypertension 08/27/2014   Bradycardia 08/27/2014   Coronary artery disease of native heart with stable angina pectoris ([place]) 08/27/2014   MI (mitral incompetence) 08/27/2014   TI (tricuspid incompetence) 08/27/2014   Combined fat and  carbohydrate induced hyperlipemia 08/13/2014   HTN (hypertension), malignant 08/11/2014   Chest pain 08/11/2014   CAD (coronary artery disease) of artery bypass graft 08/11/2014    Allergies  Allergen Reactions   Trazodone And Nefazodone Other (See Comments)    Made her pass out    Past Surgical History:  Procedure Laterality Date    COLONOSCOPY  04/04/2009   normal- Dr Vira Agar   COLONOSCOPY WITH PROPOFOL N/A 06/04/2020   Procedure: COLONOSCOPY WITH PROPOFOL;  Surgeon: Lucilla Lame, MD;  Location: [place];  Service: Endoscopy;  Laterality: N/A;   CORONARY ARTERY BYPASS GRAFT N/A 08/14/2006   Procedure: CORONARY ARTERY BYPASS GRAFT; Location: [facility]; Surgeon: Walker Shadow, MD   CYSTOSCOPY WITH RETROGRADE PYELOGRAM, URETEROSCOPY AND STENT PLACEMENT Right 11/12/2021   Procedure: CYSTOSCOPY WITH RETROGRADE PYELOGRAM / RIGHT URETEROSCOPY/HOLMIUM LASER/RIGHT STENT EXCHANGE;  Surgeon: Billey Co, MD;  Location: [facility];  Service: Urology;  Laterality: Right;   CYSTOSCOPY/URETEROSCOPY/HOLMIUM LASER/STENT PLACEMENT Right 10/29/2021   Procedure: CYSTOSCOPY/ RIGHT STENT PLACEMENT;  Surgeon: Billey Co, MD;  Location: [facility];  Service: Urology;  Laterality: Right;   ESOPHAGOGASTRODUODENOSCOPY (EGD) WITH PROPOFOL N/A 06/04/2020   Procedure: ESOPHAGOGASTRODUODENOSCOPY (EGD) WITH PROPOFOL;  Surgeon: Lucilla Lame, MD;  Location: [place];  Service: Endoscopy;  Laterality: N/A;   FRACTURE SURGERY     ankle left   LEFT HEART CATH AND CORONARY ANGIOGRAPHY Left 08/14/2006   Procedure: LEFT HEART CATHETERIZATION AND CORONARY ANGIOGRAPHY; Location: [facility]: Surgeon: Katrine Coho, MD   VAGINAL HYSTERECTOMY     DUB    Social History   Tobacco Use   Smoking status: Never    Passive exposure: Never   Smokeless tobacco: Never  Vaping Use   Vaping Use: Never used  Substance Use Topics   Alcohol use: No   Drug use: No     Medication list has been reviewed and updated.  Current Meds  Medication Sig   atorvastatin (LIPITOR) 80 MG tablet TAKE (1) TABLET BY MOUTH EVERY DAY   Cyanocobalamin (VITAMIN B-12 IJ) Inject 1,000 vials as directed every 30 (thirty) days. Patient Not sure of dosage   cyanocobalamin (VITAMIN B12) 1000 MCG tablet Take 1 tablet (1,000 mcg total) by mouth daily.   ferrous sulfate 325  (65 FE) MG tablet Take 1 tablet (325 mg total) by mouth daily with breakfast.   montelukast (SINGULAIR) 10 MG tablet TAKE (1) TABLET BY MOUTH DAILY AT BEDTIME   Nutritional Supplements (ENSURE COMPLETE PO) Take 1 Can by mouth daily.   potassium chloride SA (KLOR-CON M) 20 MEQ tablet TAKE (1) TABLET BY MOUTH EVERY DAY   Sodium Sulfate-Mag Sulfate-KCl (SUTAB) [phone] MG TABS Take 1 kit by mouth as directed.       01/17/2022    3:14 PM 04/08/2021    2:06 PM 07/20/2020    2:04 PM 09/24/2019    9:46 AM  GAD 7 : Generalized Anxiety Score  Nervous, Anxious, on Edge 0 0 2 0  Control/stop worrying 0 0 2 0  Worry too much - different things 0 0 2 0  Trouble relaxing 0 0 0 0  Restless 0 0 0 0  Easily annoyed or irritable 0 0 1 0  Afraid - awful might happen 0 0 1 0  Total GAD 7 Score 0 0 8 0  Anxiety Difficulty Not difficult at all Not difficult at all Not difficult at all        01/17/2022    3:14 PM 04/08/2021  2:06 PM 02/03/2021    3:04 PM  Depression screen PHQ 2/9  Decreased Interest 0 0 0  Down, Depressed, Hopeless 0 0 0  PHQ - 2 Score 0 0 0  Altered sleeping 0 0   Tired, decreased energy 0 0   Change in appetite 0 0   Feeling bad or failure about yourself  0 0   Trouble concentrating 0 0   Moving slowly or fidgety/restless 0 0   Suicidal thoughts 0 0   PHQ-9 Score 0 0   Difficult doing work/chores Not difficult at all Not difficult at all     BP Readings from Last 3 Encounters:  01/17/22 120/88  11/30/21 121/76  11/12/21 (!) 162/75    Physical Exam Vitals and nursing note reviewed. Exam conducted with a chaperone present.  Constitutional:      General: She is not in acute distress.    Appearance: She is not diaphoretic.  HENT:     Head: Normocephalic and atraumatic.     Right Ear: External ear normal.     Left Ear: External ear normal.     Nose: Nose normal.  Eyes:     General:        Right eye: No discharge.        Left eye: No discharge.      Conjunctiva/sclera: Conjunctivae normal.     Pupils: Pupils are equal, round, and reactive to light.  Neck:     Thyroid: No thyromegaly.     Vascular: No JVD.  Cardiovascular:     Rate and Rhythm: Normal rate and regular rhythm.     Heart sounds: Normal heart sounds. No murmur heard.    No friction rub. No gallop.  Pulmonary:     Effort: Pulmonary effort is normal.     Breath sounds: Normal breath sounds.  Abdominal:     General: Bowel sounds are normal.     Palpations: Abdomen is soft. There is no mass.     Tenderness: There is no abdominal tenderness. There is no right CVA tenderness, left CVA tenderness or guarding.  Musculoskeletal:        General: Normal range of motion.     Cervical back: Normal range of motion and neck supple.  Lymphadenopathy:     Cervical: No cervical adenopathy.  Skin:    General: Skin is warm and dry.  Neurological:     Mental Status: She is alert.     Wt Readings from Last 3 Encounters:  01/17/22 109 lb (49.4 kg)  11/30/21 110 lb (49.9 kg)  11/12/21 110 lb (49.9 kg)    BP 120/88   Pulse 68   Ht 5' 5"  (1.651 m)   Wt 109 lb (49.4 kg)   BMI 18.14 kg/m   Assessment and Plan:  1. Painful urination patient with some discomfort not really dysuria or frequency or urgency but has recently been on an antibiotic Amoxil. New onset.  Persistent.  Stable.  Urinalysis is negative for leukocytes and nitrates.  So this is unlikely to be UTI in nature. - POCT Urinalysis Dipstick  2. Vulvovaginal candidiasis New onset.  Persistent.  Pap of vaginal and perirectal because she has had some diarrhea and has gotten irritated we will treat with some nystatin cream in the event that this may be a yeast infection as well as Diflucan 150 mg once a day. - fluconazole (DIFLUCAN) 150 MG tablet; Take 1 tablet (150 mg total) by mouth once for 1 dose.  Dispense: 1 tablet; Refill: 0 - nystatin cream (MYCOSTATIN); Apply 1 Application topically 2 (two) times daily.   Dispense: 30 g; Refill: 0  Patient does relate some possibility of hemorrhoids but there is no protrusion noted by the patient to suggest this and I think this may be more perianal candidiasis.  Otilio Miu, MD

## 2022-01-21 ENCOUNTER — Telehealth: Payer: Self-pay | Admitting: *Deleted

## 2022-01-21 NOTE — Telephone Encounter (Signed)
Patient called office and she is request Sutab instructions to be sent to her.  I have updated the colonoscopy instructions and print out to be mailed since patient does not access to her MyChart.

## 2022-01-24 ENCOUNTER — Encounter: Payer: Self-pay | Admitting: Gastroenterology

## 2022-01-28 ENCOUNTER — Ambulatory Visit (INDEPENDENT_AMBULATORY_CARE_PROVIDER_SITE_OTHER): Payer: Medicare PPO

## 2022-01-28 ENCOUNTER — Ambulatory Visit: Payer: Medicare PPO

## 2022-01-28 DIAGNOSIS — E538 Deficiency of other specified B group vitamins: Secondary | ICD-10-CM | POA: Diagnosis not present

## 2022-01-28 MED ORDER — CYANOCOBALAMIN 1000 MCG/ML IJ SOLN
1000.0000 ug | Freq: Once | INTRAMUSCULAR | Status: AC
  Administered 2022-01-28: 1000 ug via INTRAMUSCULAR

## 2022-01-31 ENCOUNTER — Ambulatory Visit
Admission: RE | Admit: 2022-01-31 | Discharge: 2022-01-31 | Disposition: A | Discharge status: Home / Self Care | Payer: Medicare PPO | Attending: Gastroenterology | Admitting: Gastroenterology

## 2022-01-31 ENCOUNTER — Ambulatory Visit: Payer: Medicare PPO | Admitting: General Practice

## 2022-01-31 ENCOUNTER — Encounter
Admission: RE | Disposition: A | Discharge status: Home / Self Care | Payer: Self-pay | Source: Home / Self Care | Attending: Gastroenterology

## 2022-01-31 ENCOUNTER — Other Ambulatory Visit: Payer: Self-pay

## 2022-01-31 ENCOUNTER — Encounter: Payer: Self-pay | Admitting: Gastroenterology

## 2022-01-31 DIAGNOSIS — Z9049 Acquired absence of other specified parts of digestive tract: Secondary | ICD-10-CM | POA: Diagnosis not present

## 2022-01-31 DIAGNOSIS — K635 Polyp of colon: Secondary | ICD-10-CM

## 2022-01-31 DIAGNOSIS — I129 Hypertensive chronic kidney disease with stage 1 through stage 4 chronic kidney disease, or unspecified chronic kidney disease: Secondary | ICD-10-CM | POA: Insufficient documentation

## 2022-01-31 DIAGNOSIS — I252 Old myocardial infarction: Secondary | ICD-10-CM | POA: Diagnosis not present

## 2022-01-31 DIAGNOSIS — D123 Benign neoplasm of transverse colon: Secondary | ICD-10-CM | POA: Insufficient documentation

## 2022-01-31 DIAGNOSIS — Z951 Presence of aortocoronary bypass graft: Secondary | ICD-10-CM | POA: Insufficient documentation

## 2022-01-31 DIAGNOSIS — D561 Beta thalassemia: Secondary | ICD-10-CM | POA: Diagnosis not present

## 2022-01-31 DIAGNOSIS — E785 Hyperlipidemia, unspecified: Secondary | ICD-10-CM | POA: Diagnosis not present

## 2022-01-31 DIAGNOSIS — Z8543 Personal history of malignant neoplasm of ovary: Secondary | ICD-10-CM | POA: Diagnosis not present

## 2022-01-31 DIAGNOSIS — Z85038 Personal history of other malignant neoplasm of large intestine: Secondary | ICD-10-CM

## 2022-01-31 DIAGNOSIS — K640 First degree hemorrhoids: Secondary | ICD-10-CM | POA: Diagnosis not present

## 2022-01-31 DIAGNOSIS — Z1211 Encounter for screening for malignant neoplasm of colon: Secondary | ICD-10-CM | POA: Diagnosis not present

## 2022-01-31 DIAGNOSIS — K5730 Diverticulosis of large intestine without perforation or abscess without bleeding: Secondary | ICD-10-CM | POA: Insufficient documentation

## 2022-01-31 DIAGNOSIS — I2510 Atherosclerotic heart disease of native coronary artery without angina pectoris: Secondary | ICD-10-CM | POA: Diagnosis not present

## 2022-01-31 DIAGNOSIS — F419 Anxiety disorder, unspecified: Secondary | ICD-10-CM | POA: Diagnosis not present

## 2022-01-31 DIAGNOSIS — N189 Chronic kidney disease, unspecified: Secondary | ICD-10-CM | POA: Diagnosis not present

## 2022-01-31 HISTORY — PX: COLONOSCOPY WITH PROPOFOL: SHX5780

## 2022-01-31 SURGERY — COLONOSCOPY WITH PROPOFOL
Anesthesia: General

## 2022-01-31 MED ORDER — PROPOFOL 10 MG/ML IV BOLUS
INTRAVENOUS | Status: DC | PRN
  Administered 2022-01-31: 40 mg via INTRAVENOUS
  Administered 2022-01-31: 160 ug/kg/min via INTRAVENOUS
  Administered 2022-01-31: 20 mg via INTRAVENOUS
  Administered 2022-01-31: 160 ug/kg/min via INTRAVENOUS

## 2022-01-31 MED ORDER — LACTATED RINGERS IV SOLN: INTRAVENOUS | Status: DC

## 2022-01-31 MED ORDER — STERILE WATER FOR IRRIGATION IR SOLN
Status: DC | PRN
  Administered 2022-01-31: 1000 mL

## 2022-01-31 MED ORDER — LIDOCAINE HCL (CARDIAC) PF 100 MG/5ML IV SOSY
PREFILLED_SYRINGE | INTRAVENOUS | Status: DC | PRN
  Administered 2022-01-31: 100 mg via INTRAVENOUS

## 2022-01-31 MED ORDER — SODIUM CHLORIDE 0.9 % IV SOLN: INTRAVENOUS | Status: DC

## 2022-01-31 MED ORDER — STERILE WATER FOR IRRIGATION IR SOLN: Status: DC | PRN

## 2022-01-31 SURGICAL SUPPLY — 21 items

## 2022-01-31 NOTE — H&P (Signed)
Lucilla Lame, MD [address] [place], [place] [address] Phone:[phone] Fax : [phone]  Primary Care Physician:  Juline Patch, MD Primary Gastroenterologist:  Dr. Allen Norris  Pre-Procedure History & Physical: HPI:  Phyllis Ross is a 86 y.o. female is here for an colonoscopy.   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   a.) stage IIIc (G2, (+) LVI, (-) PNI, T3N3BMX) --> s/p RIGHT hemicolectomy + oral capecitabine   Anemia    Anxiety    a.) on BZO (alprazolam) PRN   Aortic atherosclerosis ([facility])    Arthritis    Beta thalassemia ([facility])    Bradycardia    CAD (coronary artery disease) 08/14/2006   a.) STEMI 08/14/2006 --> LHC at [facility] --> EF 45%, 95% mLAD, 95% OM1, and 75% LV groove artery --> transfer to [facility] for CVTS consult; b.) 3v CABG 08/14/2006   Chronic kidney disease    History of kidney stones    Hyperlipidemia    Hypertension    Osteoporosis    Ovarian abscess    Ovarian cancer ([place]) 2005   a.) stage IIIb adenocarcinoma --> s/p CRS 2005 + 6 cycles of carboplatin/paclitaxel   Pneumonia    as a baby   Rheumatic fever    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   a.) LIMA-LAD, SVG-RI, SVG-OM1   Skin cancer    STEMI (ST elevation myocardial infarction) ([place]) 08/14/2006   a.) LHC at [facility] --> transferred to [facility] for emergent CABG    Past Surgical History:  Procedure Laterality Date   COLONOSCOPY  04/04/2009   normal- Dr Vira Agar   COLONOSCOPY WITH PROPOFOL N/A 06/04/2020   Procedure: COLONOSCOPY WITH PROPOFOL;  Surgeon: Lucilla Lame, MD;  Location: [place];  Service: Endoscopy;  Laterality: N/A;   CORONARY ARTERY BYPASS GRAFT N/A 08/14/2006   Procedure: CORONARY ARTERY BYPASS GRAFT; Location: [facility]; Surgeon: Walker Shadow, MD   CYSTOSCOPY WITH RETROGRADE PYELOGRAM, URETEROSCOPY AND STENT PLACEMENT Right 11/12/2021   Procedure: CYSTOSCOPY WITH RETROGRADE PYELOGRAM / RIGHT URETEROSCOPY/HOLMIUM LASER/RIGHT STENT  EXCHANGE;  Surgeon: Billey Co, MD;  Location: [facility];  Service: Urology;  Laterality: Right;   CYSTOSCOPY/URETEROSCOPY/HOLMIUM LASER/STENT PLACEMENT Right 10/29/2021   Procedure: CYSTOSCOPY/ RIGHT STENT PLACEMENT;  Surgeon: Billey Co, MD;  Location: [facility];  Service: Urology;  Laterality: Right;   ESOPHAGOGASTRODUODENOSCOPY (EGD) WITH PROPOFOL N/A 06/04/2020   Procedure: ESOPHAGOGASTRODUODENOSCOPY (EGD) WITH PROPOFOL;  Surgeon: Lucilla Lame, MD;  Location: [place];  Service: Endoscopy;  Laterality: N/A;   FRACTURE SURGERY     ankle left   LEFT HEART CATH AND CORONARY ANGIOGRAPHY Left 08/14/2006   Procedure: LEFT HEART CATHETERIZATION AND CORONARY ANGIOGRAPHY; Location: [facility]: Surgeon: Katrine Coho, MD   VAGINAL HYSTERECTOMY     DUB    Prior to Admission medications   Medication Sig Start Date End Date Taking? Authorizing Provider  atorvastatin (LIPITOR) 80 MG tablet TAKE (1) TABLET BY MOUTH EVERY DAY 09/30/21  Yes Juline Patch, MD  Cyanocobalamin (VITAMIN B-12 IJ) Inject 1,000 vials as directed every 30 (thirty) days. Patient Not sure of dosage   Yes [provider]  cyanocobalamin (VITAMIN B12) 1000 MCG tablet Take 1 tablet (1,000 mcg total) by mouth daily. 12/28/21  Yes Juline Patch, MD  ferrous sulfate 325 (65 FE) MG tablet Take 1 tablet (325 mg total) by mouth daily with breakfast. 12/28/21  Yes Juline Patch, MD  montelukast (SINGULAIR) 10 MG tablet TAKE (1)  TABLET BY MOUTH DAILY AT BEDTIME 01/14/22  Yes Juline Patch, MD  Nutritional Supplements (ENSURE COMPLETE PO) Take 1 Can by mouth daily.   Yes [provider]  nystatin cream (MYCOSTATIN) Apply 1 Application topically 2 (two) times daily. 01/17/22  Yes Juline Patch, MD  potassium chloride SA (KLOR-CON M) 20 MEQ tablet TAKE (1) TABLET BY MOUTH EVERY DAY 11/17/21  Yes Verlon Au, NP  Sodium Sulfate-Mag Sulfate-KCl (SUTAB) [phone] MG TABS Take 1 kit by mouth as  directed. 01/12/22  Yes Lucilla Lame, MD    Allergies as of 01/06/2022 - Review Complete 11/30/2021  Allergen Reaction Noted   Trazodone and nefazodone Other (See Comments) 10/06/2020    Family History  Problem Relation Age of Onset   Heart attack Mother    Heart attack Maternal Uncle    Heart attack Maternal Uncle     Social History   Socioeconomic History   Marital status: Married    Spouse name: Theodoro Grist   Number of children: 2   Years of education: Not on file   Highest education level: Some college, no degree  Occupational History    Employer: RETIRED  Tobacco Use   Smoking status: Never    Passive exposure: Never   Smokeless tobacco: Never  Vaping Use   Vaping Use: Never used  Substance and Sexual Activity   Alcohol use: No   Drug use: No   Sexual activity: Yes  Other Topics Concern   Not on file  Social History Narrative   Lives at home with husband.   Social Determinants of Health   Financial Resource Strain: Low Risk  (02/03/2021)   Overall Financial Resource Strain (CARDIA)    Difficulty of Paying Living Expenses: Not hard at all  Food Insecurity: No Food Insecurity (02/03/2021)   Hunger Vital Sign    Worried About Running Out of Food in the Last Year: Never true    Ran Out of Food in the Last Year: Never true  Transportation Needs: No Transportation Needs (02/03/2021)   PRAPARE - Hydrologist (Medical): No    Lack of Transportation (Non-Medical): No  Physical Activity: Insufficiently Active (02/03/2021)   Exercise Vital Sign    Days of Exercise per Week: 4 days    Minutes of Exercise per Session: 10 min  Stress: No Stress Concern Present (02/03/2021)   [place]    Feeling of Stress : Not at all  Social Connections: [place] (02/03/2021)   Social Connection and Isolation Panel [NHANES]    Frequency of Communication with Friends and Family: More  than three times a week    Frequency of Social Gatherings with Friends and Family: More than three times a week    Attends Religious Services: More than 4 times per year    Active Member of [employer] or Organizations: Yes    Attends Music therapist: More than 4 times per year    Marital Status: Married  Human resources officer Violence: Not At Risk (02/03/2021)   Humiliation, Afraid, Rape, and Kick questionnaire    Fear of Current or Ex-Partner: No    Emotionally Abused: No    Physically Abused: No    Sexually Abused: No    Review of Systems: See HPI, otherwise negative ROS  Physical Exam: Pulse 77   Temp 98.1 F (36.7 C) (Temporal)   Resp (!) 24   Ht _0  (1.651 m)  Wt 47.9 kg   SpO2 98%   BMI 17.56 kg/m  General:   Alert,  pleasant and cooperative in NAD Head:  Normocephalic and atraumatic. Neck:  Supple; no masses or thyromegaly. Lungs:  Clear throughout to auscultation.    Heart:  Regular rate and rhythm. Abdomen:  Soft, nontender and nondistended. Normal bowel sounds, without guarding, and without rebound.   Neurologic:  Alert and  oriented x4;  grossly normal neurologically.  Impression/Plan: DALPHINE COWIE is here for an colonoscopy to be performed for colon cancr in 2022  Risks, benefits, limitations, and alternatives regarding  colonoscopy have been reviewed with the patient.  Questions have been answered.  All parties agreeable.   Lucilla Lame, MD  01/31/2022, 9:15 AM

## 2022-01-31 NOTE — Transfer of Care (Signed)
Immediate Anesthesia Transfer of Care Note  Patient: Phyllis Ross  Procedure(s) Performed: COLONOSCOPY WITH PROPOFOL  Patient Location: PACU  Anesthesia Type: General  Level of Consciousness: awake, alert  and patient cooperative  Airway and Oxygen Therapy: Patient Spontanous Breathing and Patient connected to supplemental oxygen  Post-op Assessment: Post-op Vital signs reviewed, Patient's Cardiovascular Status Stable, Respiratory Function Stable, Patent Airway and No signs of Nausea or vomiting  Post-op Vital Signs: Reviewed and stable  Complications: No notable events documented.

## 2022-01-31 NOTE — Anesthesia Preprocedure Evaluation (Signed)
Anesthesia Evaluation  Patient identified by MRN, date of birth, ID band Patient awake    Reviewed: Allergy & Precautions, NPO status , Patient's Chart, lab work & pertinent test results  History of Anesthesia Complications Negative for: history of anesthetic complications  Airway Mallampati: III  TM Distance: <3 FB Neck ROM: Limited    Dental  (+) Teeth Intact, Dental Advidsory Given   Pulmonary neg pulmonary ROS,    Pulmonary exam normal breath sounds clear to auscultation       Cardiovascular Exercise Tolerance: Good hypertension, (-) angina+ CAD, + Past MI and + CABG  Normal cardiovascular exam(-) dysrhythmias + Valvular Problems/Murmurs MR  Rhythm:Regular     Neuro/Psych negative neurological ROS  negative psych ROS   GI/Hepatic Neg liver ROS, s/p RIGHT hemicolectomy + oral capecitabine   Endo/Other  negative endocrine ROS  Renal/GU CRFRenal disease     Musculoskeletal   Abdominal Normal abdominal exam  (+)   Peds negative pediatric ROS (+)  Hematology  (+) Blood dyscrasia, anemia , Beta Thalassemia   Anesthesia Other Findings Past Medical History: 06/2020: Adenocarcinoma of cecum ([facility])     Comment:  a.) stage IIIc (G2, (+) LVI, (-) PNI, T3N3BMX) --> s/p               RIGHT hemicolectomy + oral capecitabine No date: Anemia No date: Anxiety     Comment:  a.) on BZO (alprazolam) PRN No date: Aortic atherosclerosis (HCC) No date: Beta thalassemia (HCC) No date: Bradycardia 08/14/2006: CAD (coronary artery disease)     Comment:  a.) STEMI 08/14/2006 --> LHC at [facility] --> EF 45%, 95%               mLAD, 95% OM1, and 75% LV groove artery --> transfer to               [facility] for CVTS consult; b.) 3v CABG 08/14/2006 No date: History of kidney stones No date: Hyperlipidemia No date: Hypertension No date: Osteoporosis No date: Ovarian abscess 2005: Ovarian cancer ([place])     Comment:  a.) stage IIIb  adenocarcinoma --> s/p CRS 2005 + 6               cycles of carboplatin/paclitaxel No date: Pneumonia     Comment:  as a baby No date: Rheumatic fever 08/14/2006: S/P CABG x 3     Comment:  a.) LIMA-LAD, SVG-RI, SVG-OM1 No date: Skin cancer 08/14/2006: STEMI (ST elevation myocardial infarction) ([place])     Comment:  a.) LHC at [facility] --> transferred to [facility] for emergent               CABG  Past Surgical History: 04/04/2009: COLONOSCOPY     Comment:  normal- Dr Vira Agar 06/04/2020: COLONOSCOPY WITH PROPOFOL; N/A     Comment:  Procedure: COLONOSCOPY WITH PROPOFOL;  Surgeon: Lucilla Lame, MD;  Location: [place];  Service:               Endoscopy;  Laterality: N/A; 08/14/2006: CORONARY ARTERY BYPASS GRAFT; N/A     Comment:  Procedure: CORONARY ARTERY BYPASS GRAFT; Location: [facility];              Surgeon: Walker Shadow, MD 06/04/2020: ESOPHAGOGASTRODUODENOSCOPY (EGD) WITH PROPOFOL; N/A     Comment:  Procedure: ESOPHAGOGASTRODUODENOSCOPY (EGD) WITH               PROPOFOL;  Surgeon:  Lucilla Lame, MD;  Location: [place];  Service: Endoscopy;  Laterality: N/A; No date: FRACTURE SURGERY 08/14/2006: LEFT HEART CATH AND CORONARY ANGIOGRAPHY; Left     Comment:  Procedure: LEFT HEART CATHETERIZATION AND CORONARY               ANGIOGRAPHY; Location: [facility]: Surgeon: Katrine Coho, MD No date: VAGINAL HYSTERECTOMY     Comment:  DUB  BMI    Body Mass Index: 18.14 kg/m      Reproductive/Obstetrics negative OB ROS                             Anesthesia Physical  Anesthesia Plan  ASA: 3  Anesthesia Plan: General   Post-op Pain Management: Minimal or no pain anticipated   Induction: Intravenous  PONV Risk Score and Plan: Propofol infusion and TIVA  Airway Management Planned: Natural Airway and Nasal Cannula  Additional Equipment:   Intra-op Plan:   Post-operative Plan:   Informed Consent: I have reviewed the  patients History and Physical, chart, labs and discussed the procedure including the risks, benefits and alternatives for the proposed anesthesia with the patient or authorized representative who has indicated his/her understanding and acceptance.     Dental Advisory Given  Plan Discussed with: CRNA and Surgeon  Anesthesia Plan Comments:         Anesthesia Quick Evaluation

## 2022-01-31 NOTE — Anesthesia Postprocedure Evaluation (Signed)
Anesthesia Post Note  Patient: Phyllis Ross  Procedure(s) Performed: COLONOSCOPY WITH PROPOFOL  Patient location during evaluation: PACU Anesthesia Type: General Level of consciousness: awake and alert Pain management: pain level controlled Vital Signs Assessment: post-procedure vital signs reviewed and stable Respiratory status: spontaneous breathing, nonlabored ventilation, respiratory function stable and patient connected to nasal cannula oxygen Cardiovascular status: blood pressure returned to baseline and stable Postop Assessment: no apparent nausea or vomiting Anesthetic complications: no   There were no known notable events for this encounter.   Last Vitals:  Vitals:   01/31/22 1006 01/31/22 1013  BP: (!) 95/54 (!) 130/58  Pulse: (!) 59 (!) 58  Resp: (!) 22 (!) 21  Temp:    SpO2: 97% 99%    Last Pain:  Vitals:   01/31/22 1006  TempSrc:   PainSc: 0-No pain                 Martha Clan

## 2022-01-31 NOTE — Op Note (Signed)
[facility] Gastroenterology Patient Name: Phyllis Ross Procedure Date: 01/31/2022 9:36 AM MRN: [id number] Account #: [id number] Date of Birth: 06/12/1935 Admit Type: Outpatient Age: 86 Room: [facility] OR ROOM 01 Gender: Female Note Status: Finalized Instrument Name: [id number] Procedure:             Colonoscopy Indications:           High risk colon cancer surveillance: Personal history                         of colon cancer Providers:             Lucilla Lame MD, MD Referring MD:          Juline Patch, MD (Referring MD) Medicines:             Propofol per Anesthesia Complications:         No immediate complications. Procedure:             Pre-Anesthesia Assessment:                        - Prior to the procedure, a History and Physical was                         performed, and patient medications and allergies were                         reviewed. The patient's tolerance of previous                         anesthesia was also reviewed. The risks and benefits                         of the procedure and the sedation options and risks                         were discussed with the patient. All questions were                         answered, and informed consent was obtained. Prior                         Anticoagulants: The patient has taken no anticoagulant                         or antiplatelet agents. ASA Grade Assessment: II - A                         patient with mild systemic disease. After reviewing                         the risks and benefits, the patient was deemed in                         satisfactory condition to undergo the procedure.                        After obtaining informed consent, the colonoscope was  passed under direct vision. Throughout the procedure,                         the patient's blood pressure, pulse, and oxygen                         saturations were monitored continuously. The                          Colonoscope was introduced through the anus and                         advanced to the the ileocolonic anastomosis. The                         colonoscopy was performed without difficulty. The                         patient tolerated the procedure well. The quality of                         the bowel preparation was excellent. Findings:      The perianal and digital rectal examinations were normal.      There was evidence of a prior end-to-side colo-colonic anastomosis in       the transverse colon. This was patent and was characterized by healthy       appearing mucosa.      A 5 mm polyp was found in the transverse colon. The polyp was sessile.       The polyp was removed with a cold snare. Resection and retrieval were       complete.      Multiple small-mouthed diverticula were found in the sigmoid colon.      Non-bleeding internal hemorrhoids were found during retroflexion. The       hemorrhoids were Grade I (internal hemorrhoids that do not prolapse). Impression:            - Patent end-to-side colo-colonic anastomosis,                         characterized by healthy appearing mucosa.                        - One 5 mm polyp in the transverse colon, removed with                         a cold snare. Resected and retrieved.                        - Diverticulosis in the sigmoid colon.                        - Non-bleeding internal hemorrhoids. Recommendation:        - Discharge patient to home.                        - Resume previous diet.                        - Continue present medications.                        -  Await pathology results.                        - Repeat colonoscopy is not recommended for                         surveillance. Procedure Code(s):     --- Professional ---                        [phone], Colonoscopy, flexible; with removal of                         tumor(s), polyp(s), or other lesion(s) by snare                         technique Diagnosis  Code(s):     --- Professional ---                        S50.539, Personal history of other malignant neoplasm                         of large intestine                        D12.3, Benign neoplasm of transverse colon (hepatic                         flexure or splenic flexure) CPT copyright 2022 American Medical Association. All rights reserved. The codes documented in this report are preliminary and upon coder review may  be revised to meet current compliance requirements. Lucilla Lame MD, MD 01/31/2022 9:57:30 AM This report has been signed electronically. Number of Addenda: 0 Note Initiated On: 01/31/2022 9:36 AM Scope Withdrawal Time: 0 hours 5 minutes 6 seconds  Total Procedure Duration: 0 hours 9 minutes 17 seconds  Estimated Blood Loss:  Estimated blood loss: none.      [facility]

## 2022-02-01 ENCOUNTER — Encounter: Payer: Self-pay | Admitting: Gastroenterology

## 2022-02-01 NOTE — Addendum Note (Signed)
Addendum  created 02/01/22 1243 by Patience Musca., CRNA   Intraprocedure Event edited, Intraprocedure Staff edited

## 2022-02-03 ENCOUNTER — Encounter: Payer: Self-pay | Admitting: Gastroenterology

## 2022-02-03 LAB — SURGICAL PATHOLOGY

## 2022-02-07 ENCOUNTER — Ambulatory Visit: Payer: Medicare PPO

## 2022-02-09 ENCOUNTER — Ambulatory Visit (INDEPENDENT_AMBULATORY_CARE_PROVIDER_SITE_OTHER): Payer: Medicare PPO

## 2022-02-09 DIAGNOSIS — Z0000 Encounter for general adult medical examination without abnormal findings: Secondary | ICD-10-CM | POA: Diagnosis not present

## 2022-02-09 NOTE — Patient Instructions (Signed)

## 2022-02-09 NOTE — Progress Notes (Signed)
I connected with  [name] on 02/09/22 by a audio enabled telemedicine application and verified that I am speaking with the correct person using two identifiers.  Patient Location: Home  Provider Location: Office/Clinic  I discussed the limitations of evaluation and management by telemedicine. The patient expressed understanding and agreed to proceed.  Subjective:   Phyllis Ross is a 86 y.o. female who presents for Medicare Annual (Subsequent) preventive examination.  Review of Systems    Per HPI unless specifically indicated below.  Cardiac Risk Factors include: advanced age (>6mn, >>53women);female gender          Objective:        01/31/2022   10:13 AM 01/31/2022   10:06 AM 01/31/2022   10:00 AM  Vitals with BMI  Systolic 111695 95  Diastolic 58 54 48  Pulse 58 59 56    Today's Vitals   02/09/22 1509  PainSc: 0-No pain   There is no height or weight on file to calculate BMI.     02/09/2022    3:18 PM 01/31/2022    8:58 AM 11/12/2021   10:19 AM 11/08/2021    8:19 AM 10/29/2021    7:24 AM 10/25/2021   11:02 AM 10/19/2021    1:11 PM  Advanced Directives  Does Patient Have a Medical Advance Directive? Yes Yes Yes No No No No  Type of [place]aramed[place]of [place] will [place] will [place] will      Does patient want to make changes to medical advance directive? No - Patient declined No - Patient declined No - Patient declined      Copy of [place] Chart? No - copy requested No - copy requested No - copy requested      Would patient like information on creating a medical advance directive?   No - Patient declined  No - Patient declined No - Patient declined No - Patient declined    Current Medications (verified) Outpatient Encounter Medications as of 02/09/2022  Medication Sig   atorvastatin (LIPITOR) 80 MG tablet TAKE (1) TABLET BY MOUTH EVERY DAY    Cyanocobalamin (VITAMIN B-12 IJ) Inject 1,000 vials as directed every 30 (thirty) days. Patient Not sure of dosage   cyanocobalamin (VITAMIN B12) 1000 MCG tablet Take 1 tablet (1,000 mcg total) by mouth daily.   ferrous sulfate 325 (65 FE) MG tablet Take 1 tablet (325 mg total) by mouth daily with breakfast.   montelukast (SINGULAIR) 10 MG tablet TAKE (1) TABLET BY MOUTH DAILY AT BEDTIME   Nutritional Supplements (ENSURE COMPLETE PO) Take 1 Can by mouth daily.   nystatin cream (MYCOSTATIN) Apply 1 Application topically 2 (two) times daily.   potassium chloride SA (KLOR-CON M) 20 MEQ tablet TAKE (1) TABLET BY MOUTH EVERY DAY   Sodium Sulfate-Mag Sulfate-KCl (SUTAB) 1660 488 8991MG TABS Take 1 kit by mouth as directed.   No facility-administered encounter medications on file as of 02/09/2022.    Allergies (verified) Trazodone and nefazodone   History: Past Medical History:  Diagnosis Date   Adenocarcinoma of c[place]um [place] 06/[place]   a.) stage IIIc (G2, (+) LVI, (-) PNI, T3N3BMX) --> s/p RIGHT hemicolectomy + oral capecitabine   Anemia    Anxiety    a.) on BZO (alprazolam) PRN   Aortic atherosclerosis (HCC)    Arthritis    Beta thalassemia (HCC)    Bradycardia    CAD (coronary artery  disease) 08/14/2006   a.) STEMI 08/14/2006 --> LHC at [facility] --> EF 45%, 95% mLAD, 95% OM1, and 75% LV groove artery --> transfer to [facility] for CVTS consult; b.) 3v CABG 08/14/2006   Chronic kidney disease    History of kidney stones    Hyperlipidemia    Hypertension    Osteoporosis    Ovarian abscess    Ovarian cancer ([place]) 2005   a.) stage IIIb adenocarcinoma --> s/p CRS 2005 + 6 cycles of carboplatin/paclitaxel   Pneumonia    as a baby   Rheumatic fever    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   a.) LIMA-LAD, SVG-RI, SVG-OM1   Skin cancer    STEMI (ST elevation myocardial infarction) ([place]) 08/14/2006   a.) LHC at [facility] --> transferred to [facility] for emergent CABG   Past Surgical History:  Procedure Laterality  Date   COLONOSCOPY  04/04/2009   normal- Dr Vira Agar   COLONOSCOPY WITH PROPOFOL N/A 06/04/2020   Procedure: COLONOSCOPY WITH PROPOFOL;  Surgeon: Lucilla Lame, MD;  Location: [place];  Service: Endoscopy;  Laterality: N/A;   COLONOSCOPY WITH PROPOFOL N/A 01/31/2022   Procedure: COLONOSCOPY WITH PROPOFOL;  Surgeon: Lucilla Lame, MD;  Location: [place];  Service: Endoscopy;  Laterality: N/A;   CORONARY ARTERY BYPASS GRAFT N/A 08/14/2006   Procedure: CORONARY ARTERY BYPASS GRAFT; Location: [facility]; Surgeon: Walker Shadow, MD   CYSTOSCOPY WITH RETROGRADE PYELOGRAM, URETEROSCOPY AND STENT PLACEMENT Right 11/12/2021   Procedure: CYSTOSCOPY WITH RETROGRADE PYELOGRAM / RIGHT URETEROSCOPY/HOLMIUM LASER/RIGHT STENT EXCHANGE;  Surgeon: Billey Co, MD;  Location: [facility];  Service: Urology;  Laterality: Right;   CYSTOSCOPY/URETEROSCOPY/HOLMIUM LASER/STENT PLACEMENT Right 10/29/2021   Procedure: CYSTOSCOPY/ RIGHT STENT PLACEMENT;  Surgeon: Billey Co, MD;  Location: [facility];  Service: Urology;  Laterality: Right;   ESOPHAGOGASTRODUODENOSCOPY (EGD) WITH PROPOFOL N/A 06/04/2020   Procedure: ESOPHAGOGASTRODUODENOSCOPY (EGD) WITH PROPOFOL;  Surgeon: Lucilla Lame, MD;  Location: [place];  Service: Endoscopy;  Laterality: N/A;   FRACTURE SURGERY     ankle left   LEFT HEART CATH AND CORONARY ANGIOGRAPHY Left 08/14/2006   Procedure: LEFT HEART CATHETERIZATION AND CORONARY ANGIOGRAPHY; Location: [facility]: Surgeon: Katrine Coho, MD   VAGINAL HYSTERECTOMY     DUB   Family History  Problem Relation Age of Onset   Heart attack Mother    Heart attack Maternal Uncle    Heart attack Maternal Uncle    Social History   Socioeconomic History   Marital status: Married    Spouse name: Theodoro Grist   Number of children: 2   Years of education: Not on file   Highest education level: Some college, no degree  Occupational History    Employer: RETIRED  Tobacco Use   Smoking status:  Never    Passive exposure: Never   Smokeless tobacco: Never  Vaping Use   Vaping Use: Never used  Substance and Sexual Activity   Alcohol use: No   Drug use: No   Sexual activity: Yes  Other Topics Concern   Not on file  Social History Narrative   Lives at home with husband.   Social Determinants of Health   Financial Resource Strain: Low Risk  (02/03/2021)   Overall Financial Resource Strain (CARDIA)    Difficulty of Paying Living Expenses: Not hard at all  Food Insecurity: No Food Insecurity (02/09/2022)   Hunger Vital Sign    Worried About Running Out of Food in the Last Year: Never true    Ran Out of  Food in the Last Year: Never true  Transportation Needs: No Transportation Needs (02/09/2022)   PRAPARE - Hydrologist (Medical): No    Lack of Transportation (Non-Medical): No  Physical Activity: Sufficiently Active (02/09/2022)   Exercise Vital Sign    Days of Exercise per Week: 3 days    Minutes of Exercise per Session: 50 min  Stress: No Stress Concern Present (02/09/2022)   [place]    Feeling of Stress : Not at all  Social Connections: Moderately Integrated (02/09/2022)   Social Connection and Isolation Panel [NHANES]    Frequency of Communication with Friends and Family: More than three times a week    Frequency of Social Gatherings with Friends and Family: Three times a week    Attends Religious Services: Never    Active Member of Clubs or Organizations: Yes    Attends Music therapist: More than 4 times per year    Marital Status: Married    Tobacco Counseling Counseling given: No   Clinical Intake:  Pre-visit preparation completed: No  Pain : No/denies pain Pain Score: 0-No pain     Nutritional Status: BMI of 19-24  Normal Nutritional Risks: Unintentional weight loss Diabetes: No  How often do you need to have someone help you when you read  instructions, pamphlets, or other written materials from your doctor or pharmacy?: 1 - Never  Diabetic?No  Interpreter Needed?: No  Information entered by :: Donnie Mesa, CMA   Activities of Daily Living    02/09/2022    3:07 PM 01/31/2022    8:54 AM  In your present state of health, do you have any difficulty performing the following activities:  Hearing? 0 0  Vision? 0 0  Difficulty concentrating or making decisions? 0 0  Walking or climbing stairs? 0 0  Dressing or bathing? 0 0  Doing errands, shopping? 0     Patient Care Team: Juline Patch, MD as PCP - General (Family Medicine) Corey Skains, MD as Consulting Physician (Cardiology) Clent Jacks, RN as Oncology Nurse Navigator Ronny Bacon, MD as Consulting Physician (General Surgery) Sindy Guadeloupe, MD as Consulting Physician (Oncology)  Indicate any recent Medical Services you may have received from other than [facility] providers in the past year (date may be approximate). The pt was seen at [facility] on 01/31/22 for a Colonoscopy.    Assessment:   This is a routine wellness examination for Marrianne.  Hearing/Vision screen Denies any hearing.  Denies any vision changes. Annual Eye Exam, Dr. Matilde Sprang.    Dietary issues and exercise activities discussed: Current Exercise Habits: Home exercise routine, Time (Minutes): 30, Frequency (Times/Week): 3, Weekly Exercise (Minutes/Week): 90, Intensity: Moderate   Goals Addressed             This Visit's Progress    Gain weight         Depression Screen    02/09/2022    3:07 PM 01/17/2022    3:14 PM 04/08/2021    2:06 PM 02/03/2021    3:04 PM 07/20/2020    2:03 PM 02/03/2020    2:23 PM 09/24/2019    9:46 AM  PHQ 2/9 Scores  PHQ - 2 Score 0 0 0 0 0 0 0  PHQ- 9 Score 0 0 0  5  0    Fall Risk    02/09/2022    3:07 PM 01/17/2022  3:14 PM 02/03/2021    3:06 PM 07/16/2020    1:22 PM 06/09/2020    9:57 AM  Jette in the past year? 0  0 0 0 0  Number falls in past yr: 0 0 0    Injury with Fall? 0 0 0    Risk for fall due to : No Fall Risks Impaired balance/gait No Fall Risks    Follow up Falls evaluation completed Falls evaluation completed Falls prevention discussed      FALL RISK PREVENTION PERTAINING TO THE HOME:  Any stairs in or around the home? No  If so, are there any without handrails? No  Home free of loose throw rugs in walkways, pet beds, electrical cords, etc? Yes  Adequate lighting in your home to reduce risk of falls? Yes   ASSISTIVE DEVICES UTILIZED TO PREVENT FALLS:  Life alert? No  Use of a cane, walker or w/c? No  Grab bars in the bathroom? Yes  Shower chair or bench in shower? Yes  Elevated toilet seat or a handicapped toilet? No   TIMED UP AND GO:  Was the test performed?  No virtual appt  .    Cognitive Function:        02/09/2022    3:08 PM 02/03/2020    2:50 PM 05/14/2018    3:56 PM 02/13/2017    3:13 PM  6CIT Screen  What Year? 0 points 0 points 0 points 0 points  What month? 0 points 0 points 0 points 0 points  What time? 0 points 0 points 0 points 0 points  Count back from 20 0 points 0 points 0 points 0 points  Months in reverse 0 points 0 points 0 points 0 points  Repeat phrase 0 points 0 points 0 points 2 points  Total Score 0 points 0 points 0 points 2 points    Immunizations Immunization History  Administered Date(s) Administered   Fluad Quad(high Dose 65+) 02/03/2021   Influenza-Unspecified 01/03/2019   PFIZER(Purple Top)SARS-COV-2 Vaccination 05/29/2019, 06/19/2019, 03/14/2020   Tdap 06/16/2016    TDAP status: Up to date  Flu Vaccine status: Due, Education has been provided regarding the importance of this vaccine. Advised may receive this vaccine at local pharmacy or Health Dept. Aware to provide a copy of the vaccination record if obtained from local pharmacy or Health Dept. Verbalized acceptance and understanding.  Pneumococcal vaccine status: Due,  Education has been provided regarding the importance of this vaccine. Advised may receive this vaccine at local pharmacy or Health Dept. Aware to provide a copy of the vaccination record if obtained from local pharmacy or Health Dept. Verbalized acceptance and understanding.  Covid-19 vaccine status: Information provided on how to obtain vaccines.   Qualifies for Shingles Vaccine? Yes   Zostavax completed No   Shingrix Completed?: No.    Education has been provided regarding the importance of this vaccine. Patient has been advised to call insurance company to determine out of pocket expense if they have not yet received this vaccine. Advised may also receive vaccine at local pharmacy or Health Dept. Verbalized acceptance and understanding.  Screening Tests Health Maintenance  Topic Date Due   Zoster Vaccines- Shingrix (1 of 2) Never done   COVID-19 Vaccine (4 - Pfizer risk series) 05/09/2020   Pneumonia Vaccine 31+ Years old (1 - PCV) 04/08/2022 (Originally 02/12/2003)   INFLUENZA VACCINE  07/03/2022 (Originally 11/02/2021)   Medicare Annual Wellness (AWV)  02/10/2023   TETANUS/TDAP  06/17/2026  HPV VACCINES  Aged Out   DEXA SCAN  Discontinued    Health Maintenance  Health Maintenance Due  Topic Date Due   Zoster Vaccines- Shingrix (1 of 2) Never done   COVID-19 Vaccine (4 - Pfizer risk series) 05/09/2020    Colorectal cancer screening: No longer required.   Mammogram status: No longer required due to age.   Lung Cancer Screening: (Low Dose CT Chest recommended if Age 32-80 years, 30 pack-year currently smoking OR have quit w/in 15years.) does not qualify.     Additional Screening:  Hepatitis C Screening: does not qualify  Vision Screening: Recommended annual ophthalmology exams for early detection of glaucoma and other disorders of the eye. Is the patient up to date with their annual eye exam?  Yes  Who is the provider or what is the name of the office in which the patient  attends annual eye exams? Dr. Matilde Sprang  If pt is not established with a provider, would they like to be referred to a provider to establish care? No .   Dental Screening: Recommended annual dental exams for proper oral hygiene  Community Resource Referral / Chronic Care Management: CRR required this visit?  No   CCM required this visit?  No      Plan:     I have personally reviewed and noted the following in the patient's chart:   Medical and social history Use of alcohol, tobacco or illicit drugs  Current medications and supplements including opioid prescriptions. Patient is not currently taking opioid prescriptions. Functional ability and status Nutritional status Physical activity Advanced directives List of other physicians Hospitalizations, surgeries, and ER visits in previous 12 months Vitals Screenings to include cognitive, depression, and falls Referrals and appointments  In addition, I have reviewed and discussed with patient certain preventive protocols, quality metrics, and best practice recommendations. A written personalized care plan for preventive services as well as general preventive health recommendations were provided to patient.    Ms. Manger , Thank you for taking time to come for your Medicare Wellness Visit. I appreciate your ongoing commitment to your health goals. Please review the following plan we discussed and let me know if I can assist you in the future.   These are the goals we discussed:  Goals       Gain weight      Reduce portion size (pt-stated)      Pt states she will reduce portion sizes and eat more vegetables        This is a list of the screening recommended for you and due dates:  Health Maintenance  Topic Date Due   Zoster (Shingles) Vaccine (1 of 2) Never done   COVID-19 Vaccine (4 - Pfizer risk series) 05/09/2020   Pneumonia Vaccine (1 - PCV) 04/08/2022*   Flu Shot  07/03/2022*   Medicare Annual Wellness Visit  02/10/2023    Tetanus Vaccine  06/17/2026   HPV Vaccine  Aged Out   DEXA scan (bone density measurement)  Discontinued  *Topic was postponed. The date shown is not the original due date.     Wilson Singer, [place]   02/09/2022   Nurse Notes: Approximately 30 minute Non-Face-To-Face Visit

## 2022-02-10 ENCOUNTER — Ambulatory Visit
Admission: RE | Admit: 2022-02-10 | Discharge: 2022-02-10 | Disposition: A | Discharge status: Home / Self Care | Payer: Medicare PPO | Source: Ambulatory Visit | Attending: Oncology | Admitting: Oncology

## 2022-02-10 DIAGNOSIS — I700 Atherosclerosis of aorta: Secondary | ICD-10-CM | POA: Diagnosis not present

## 2022-02-10 DIAGNOSIS — Z08 Encounter for follow-up examination after completed treatment for malignant neoplasm: Secondary | ICD-10-CM | POA: Insufficient documentation

## 2022-02-10 DIAGNOSIS — Z85038 Personal history of other malignant neoplasm of large intestine: Secondary | ICD-10-CM | POA: Diagnosis not present

## 2022-02-10 DIAGNOSIS — C19 Malignant neoplasm of rectosigmoid junction: Secondary | ICD-10-CM | POA: Diagnosis not present

## 2022-02-10 DIAGNOSIS — J984 Other disorders of lung: Secondary | ICD-10-CM | POA: Diagnosis not present

## 2022-02-10 DIAGNOSIS — I728 Aneurysm of other specified arteries: Secondary | ICD-10-CM | POA: Diagnosis not present

## 2022-02-10 DIAGNOSIS — N1330 Unspecified hydronephrosis: Secondary | ICD-10-CM | POA: Diagnosis not present

## 2022-02-10 DIAGNOSIS — K5730 Diverticulosis of large intestine without perforation or abscess without bleeding: Secondary | ICD-10-CM | POA: Diagnosis not present

## 2022-02-10 LAB — POCT I-STAT CREATININE: Creatinine, Ser: 0.8 mg/dL (ref 0.44–1.00)

## 2022-02-10 MED ORDER — IOHEXOL 300 MG/ML  SOLN
85.0000 mL | Freq: Once | INTRAMUSCULAR | Status: AC | PRN
  Administered 2022-02-10: 85 mL via INTRAVENOUS

## 2022-02-11 ENCOUNTER — Ambulatory Visit: Payer: Medicare PPO

## 2022-02-15 ENCOUNTER — Other Ambulatory Visit: Payer: Self-pay

## 2022-02-15 DIAGNOSIS — Z08 Encounter for follow-up examination after completed treatment for malignant neoplasm: Secondary | ICD-10-CM

## 2022-02-16 ENCOUNTER — Inpatient Hospital Stay: Payer: Medicare PPO | Attending: Oncology

## 2022-02-16 ENCOUNTER — Encounter: Payer: Self-pay | Admitting: Oncology

## 2022-02-16 ENCOUNTER — Inpatient Hospital Stay (HOSPITAL_BASED_OUTPATIENT_CLINIC_OR_DEPARTMENT_OTHER): Payer: Medicare PPO | Admitting: Oncology

## 2022-02-16 VITALS — BP 142/59 | HR 53 | Temp 98.0°F | Resp 16 | Wt 107.5 lb

## 2022-02-16 DIAGNOSIS — D509 Iron deficiency anemia, unspecified: Secondary | ICD-10-CM | POA: Diagnosis not present

## 2022-02-16 DIAGNOSIS — Z08 Encounter for follow-up examination after completed treatment for malignant neoplasm: Secondary | ICD-10-CM

## 2022-02-16 DIAGNOSIS — Z8543 Personal history of malignant neoplasm of ovary: Secondary | ICD-10-CM | POA: Insufficient documentation

## 2022-02-16 DIAGNOSIS — D561 Beta thalassemia: Secondary | ICD-10-CM | POA: Diagnosis not present

## 2022-02-16 DIAGNOSIS — Z85038 Personal history of other malignant neoplasm of large intestine: Secondary | ICD-10-CM

## 2022-02-16 LAB — CBC WITH DIFFERENTIAL/PLATELET
Abs Immature Granulocytes: 0.02 10*3/uL (ref 0.00–0.07)
Basophils Absolute: 0.1 10*3/uL (ref 0.0–0.1)
Basophils Relative: 1 %
Eosinophils Absolute: 0.1 10*3/uL (ref 0.0–0.5)
Eosinophils Relative: 1 %
HCT: 36.6 % (ref 36.0–46.0)
Hemoglobin: 11 g/dL — ABNORMAL LOW (ref 12.0–15.0)
Immature Granulocytes: 0 %
Lymphocytes Relative: 27 %
Lymphs Abs: 2 10*3/uL (ref 0.7–4.0)
MCH: 20.9 pg — ABNORMAL LOW (ref 26.0–34.0)
MCHC: 30.1 g/dL (ref 30.0–36.0)
MCV: 69.4 fL — ABNORMAL LOW (ref 80.0–100.0)
Monocytes Absolute: 0.7 10*3/uL (ref 0.1–1.0)
Monocytes Relative: 10 %
Neutro Abs: 4.6 10*3/uL (ref 1.7–7.7)
Neutrophils Relative %: 61 %
Platelets: 218 10*3/uL (ref 150–400)
RBC: 5.27 MIL/uL — ABNORMAL HIGH (ref 3.87–5.11)
RDW: 15.6 % — ABNORMAL HIGH (ref 11.5–15.5)
WBC: 7.5 10*3/uL (ref 4.0–10.5)
nRBC: 0 % (ref 0.0–0.2)

## 2022-02-16 LAB — COMPREHENSIVE METABOLIC PANEL
ALT: 80 U/L — ABNORMAL HIGH (ref 0–44)
AST: 51 U/L — ABNORMAL HIGH (ref 15–41)
Albumin: 3.7 g/dL (ref 3.5–5.0)
Alkaline Phosphatase: 65 U/L (ref 38–126)
Anion gap: 7 (ref 5–15)
BUN: 21 mg/dL (ref 8–23)
CO2: 29 mmol/L (ref 22–32)
Calcium: 9.2 mg/dL (ref 8.9–10.3)
Chloride: 102 mmol/L (ref 98–111)
Creatinine, Ser: 0.87 mg/dL (ref 0.44–1.00)
GFR, Estimated: >60 mL/min (ref >60)
Glucose, Bld: 125 mg/dL — ABNORMAL HIGH (ref 70–99)
Potassium: 3.6 mmol/L (ref 3.5–5.1)
Sodium: 138 mmol/L (ref 135–145)
Total Bilirubin: 1.4 mg/dL — ABNORMAL HIGH (ref 0.3–1.2)
Total Protein: 7 g/dL (ref 6.5–8.1)

## 2022-02-16 NOTE — Progress Notes (Signed)
Hematology/Oncology Consult note [facility]  [phone] [phone]  Patient Care Team: Juline Patch, MD as PCP - General (Family Medicine) Corey Skains, MD as Consulting Physician (Cardiology) Clent Jacks, RN as Oncology Nurse Navigator Ronny Bacon, MD as Consulting Physician (General Surgery) Sindy Guadeloupe, MD as Consulting Physician (Oncology)   Name of the patient: Phyllis Ross  [id number]  [birth year]   Date of visit: 02/16/22  Diagnosis- stage III cecal adenocarcinoma currently in remission    Chief complaint/ Reason for visit-routine follow-up of colon cancer to discuss CT scan results and further management  Heme/Onc history: Patient is a 86 year old female who was 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  She is s/p laparoscopic right hemicolectomy.  Pathology showed 5.2 x 4.3 x 2.7 cm grade 2 moderately differentiated adenocarcinoma with invasion through muscularis propria into pericolorectal tissue.  7 of 27 lymph nodes positive.  Lymphovascular invasion present but no perineural invasion.  MMR negative.  T3N2BMX.  Right hemicolectomy complicated by anastomotic breakdown/leak which was treated with antibiotics.  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  Also has a history of baseline microcytic anemia secondary to beta thalassemia and baseline hemoglobin is between 10-11.   Patient has met with Dr. Mike Gip in the past management chemotherapy options including oral Xeloda has been offered to the patient   Patient completed 8 cycles of adjuvant Xeloda in November 2022.  Patient also has a history of B12 deficiency for which she is on monthly B12.    Interval history-patient is doing well for her age.  Bowel movements are regular.  Denies any nausea or  vomiting  ECOG PS- 1 Pain scale- 0   Review of systems- Review of Systems  Constitutional:  Positive for malaise/fatigue. Negative for chills, fever and weight loss.  HENT:  Negative for congestion, ear discharge and nosebleeds.   Eyes:  Negative for blurred vision.  Respiratory:  Negative for cough, hemoptysis, sputum production, shortness of breath and wheezing.   Cardiovascular:  Negative for chest pain, palpitations, orthopnea and claudication.  Gastrointestinal:  Negative for abdominal pain, blood in stool, constipation, diarrhea, heartburn, melena, nausea and vomiting.  Genitourinary:  Negative for dysuria, flank pain, frequency, hematuria and urgency.  Musculoskeletal:  Negative for back pain, joint pain and myalgias.  Skin:  Negative for rash.  Neurological:  Negative for dizziness, tingling, focal weakness, seizures, weakness and headaches.  Endo/Heme/Allergies:  Does not bruise/bleed easily.  Psychiatric/Behavioral:  Negative for depression and suicidal ideas. The patient does not have insomnia.       Allergies  Allergen Reactions   Trazodone And Nefazodone Other (See Comments)    Made her pass out     Past Medical History:  Diagnosis Date   Adenocarcinoma of cecum ([place]) 06/2020   a.) stage IIIc (G2, (+) LVI, (-) PNI, T3N3BMX) --> s/p RIGHT hemicolectomy + oral capecitabine   Anemia    Anxiety    a.) on BZO (alprazolam) PRN   Aortic atherosclerosis ([facility])    Arthritis    Beta thalassemia (HCC)    Bradycardia    CAD (coronary artery disease) 08/14/2006   a.) STEMI 08/14/2006 --> LHC at [facility] --> EF 45%, 95% mLAD, 95% OM1, and 75% LV groove artery --> transfer to [facility] for CVTS consult; b.) 3v CABG 08/14/2006   Chronic  kidney disease    History of kidney stones    Hyperlipidemia    Hypertension    Osteoporosis    Ovarian abscess    Ovarian cancer ([place]) 2005   a.) stage IIIb adenocarcinoma --> s/p CRS 2005 + 6 cycles of carboplatin/paclitaxel   Pneumonia    as a  baby   Rheumatic fever    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   a.) LIMA-LAD, SVG-RI, SVG-OM1   Skin cancer    STEMI (ST elevation myocardial infarction) ([place]) 08/14/2006   a.) LHC at [facility] --> transferred to [facility] for emergent CABG     Past Surgical History:  Procedure Laterality Date   COLONOSCOPY  04/04/2009   normal- Dr Vira Agar   COLONOSCOPY WITH PROPOFOL N/A 06/04/2020   Procedure: COLONOSCOPY WITH PROPOFOL;  Surgeon: Lucilla Lame, MD;  Location: [place];  Service: Endoscopy;  Laterality: N/A;   COLONOSCOPY WITH PROPOFOL N/A 01/31/2022   Procedure: COLONOSCOPY WITH PROPOFOL;  Surgeon: Lucilla Lame, MD;  Location: [place];  Service: Endoscopy;  Laterality: N/A;   CORONARY ARTERY BYPASS GRAFT N/A 08/14/2006   Procedure: CORONARY ARTERY BYPASS GRAFT; Location: [facility]; Surgeon: Walker Shadow, MD   CYSTOSCOPY WITH RETROGRADE PYELOGRAM, URETEROSCOPY AND STENT PLACEMENT Right 11/12/2021   Procedure: CYSTOSCOPY WITH RETROGRADE PYELOGRAM / RIGHT URETEROSCOPY/HOLMIUM LASER/RIGHT STENT EXCHANGE;  Surgeon: Billey Co, MD;  Location: [facility];  Service: Urology;  Laterality: Right;   CYSTOSCOPY/URETEROSCOPY/HOLMIUM LASER/STENT PLACEMENT Right 10/29/2021   Procedure: CYSTOSCOPY/ RIGHT STENT PLACEMENT;  Surgeon: Billey Co, MD;  Location: [facility];  Service: Urology;  Laterality: Right;   ESOPHAGOGASTRODUODENOSCOPY (EGD) WITH PROPOFOL N/A 06/04/2020   Procedure: ESOPHAGOGASTRODUODENOSCOPY (EGD) WITH PROPOFOL;  Surgeon: Lucilla Lame, MD;  Location: [place];  Service: Endoscopy;  Laterality: N/A;   FRACTURE SURGERY     ankle left   LEFT HEART CATH AND CORONARY ANGIOGRAPHY Left 08/14/2006   Procedure: LEFT HEART CATHETERIZATION AND CORONARY ANGIOGRAPHY; Location: [facility]: Surgeon: Katrine Coho, MD   VAGINAL HYSTERECTOMY     DUB    Social History   Socioeconomic History   Marital status: Married    Spouse name: Theodoro Grist   Number of children: 2   Years of education:  Not on file   Highest education level: Some college, no degree  Occupational History    Employer: RETIRED  Tobacco Use   Smoking status: Never    Passive exposure: Never   Smokeless tobacco: Never  Vaping Use   Vaping Use: Never used  Substance and Sexual Activity   Alcohol use: No   Drug use: No   Sexual activity: Yes  Other Topics Concern   Not on file  Social History Narrative   Lives at home with husband.   Social Determinants of Health   Financial Resource Strain: Low Risk  (02/03/2021)   Overall Financial Resource Strain (CARDIA)    Difficulty of Paying Living Expenses: Not hard at all  Food Insecurity: No Food Insecurity (02/09/2022)   Hunger Vital Sign    Worried About Running Out of Food in the Last Year: Never true    Ran Out of Food in the Last Year: Never true  Transportation Needs: No Transportation Needs (02/09/2022)   PRAPARE - Hydrologist (Medical): No    Lack of Transportation (Non-Medical): No  Physical Activity: Sufficiently Active (02/09/2022)   Exercise Vital Sign    Days of Exercise per Week: 3 days    Minutes of Exercise per Session:  50 min  Stress: No Stress Concern Present (02/09/2022)   [place]    Feeling of Stress : Not at all  Social Connections: Moderately Integrated (02/09/2022)   Social Connection and Isolation Panel [NHANES]    Frequency of Communication with Friends and Family: More than three times a week    Frequency of Social Gatherings with Friends and Family: Three times a week    Attends Religious Services: Never    Active Member of Clubs or Organizations: Yes    Attends Archivist Meetings: More than 4 times per year    Marital Status: Married  Human resources officer Violence: Not At Risk (02/03/2021)   Humiliation, Afraid, Rape, and Kick questionnaire    Fear of Current or Ex-Partner: No    Emotionally Abused: No    Physically  Abused: No    Sexually Abused: No    Family History  Problem Relation Age of Onset   Heart attack Mother    Heart attack Maternal Uncle    Heart attack Maternal Uncle      Current Outpatient Medications:    atorvastatin (LIPITOR) 80 MG tablet, TAKE (1) TABLET BY MOUTH EVERY DAY, Disp: 90 tablet, Rfl: 0   Cyanocobalamin (VITAMIN B-12 IJ), Inject 1,000 vials as directed every 30 (thirty) days. Patient Not sure of dosage, Disp: , Rfl:    cyanocobalamin (VITAMIN B12) 1000 MCG tablet, Take 1 tablet (1,000 mcg total) by mouth daily., Disp: 90 tablet, Rfl: 0   ferrous sulfate 325 (65 FE) MG tablet, Take 1 tablet (325 mg total) by mouth daily with breakfast., Disp: 90 tablet, Rfl: 0   montelukast (SINGULAIR) 10 MG tablet, TAKE (1) TABLET BY MOUTH DAILY AT BEDTIME, Disp: 90 tablet, Rfl: 0   Nutritional Supplements (ENSURE COMPLETE PO), Take 1 Can by mouth daily., Disp: , Rfl:    potassium chloride SA (KLOR-CON M) 20 MEQ tablet, TAKE (1) TABLET BY MOUTH EVERY DAY, Disp: 30 tablet, Rfl: 2   Sodium Sulfate-Mag Sulfate-KCl (SUTAB) [phone] MG TABS, Take 1 kit by mouth as directed., Disp: 24 tablet, Rfl: 0  Physical exam:  Vitals:   02/16/22 1339  BP: (!) 142/59  Pulse: (!) 53  Resp: 16  Temp: 98 F (36.7 C)  TempSrc: Oral  Weight: 107 lb 8 oz (48.8 kg)   Physical Exam Cardiovascular:     Rate and Rhythm: Normal rate and regular rhythm.     Heart sounds: Normal heart sounds.  Pulmonary:     Effort: Pulmonary effort is normal.     Breath sounds: Normal breath sounds.  Abdominal:     General: Bowel sounds are normal.     Palpations: Abdomen is soft.  Skin:    General: Skin is warm and dry.  Neurological:     Mental Status: She is alert and oriented to person, place, and time.         Latest Ref Rng & Units 02/16/2022    1:01 PM  CMP  Glucose 70 - 99 mg/dL 125   BUN 8 - 23 mg/dL 21   Creatinine 0.44 - 1.00 mg/dL 0.87   Sodium 135 - 145 mmol/L 138   Potassium 3.5 - 5.1  mmol/L 3.6   Chloride 98 - 111 mmol/L 102   CO2 22 - 32 mmol/L 29   Calcium 8.9 - 10.3 mg/dL 9.2   Total Protein 6.5 - 8.1 g/dL 7.0   Total Bilirubin 0.3 - 1.2 mg/dL 1.4  Alkaline Phos 38 - 126 U/L 65   AST 15 - 41 U/L 51   ALT 0 - 44 U/L 80       Latest Ref Rng & Units 02/16/2022    1:01 PM  CBC  WBC 4.0 - 10.5 K/uL 7.5   Hemoglobin 12.0 - 15.0 g/dL 11.0   Hematocrit 36.0 - 46.0 % 36.6   Platelets 150 - 400 K/uL 218     No images are attached to the encounter.  CT CHEST ABDOMEN PELVIS W CONTRAST  Result Date: 02/13/2022 CLINICAL DATA:  Colorectal cancer.  Restaging EXAM: CT CHEST, ABDOMEN, AND PELVIS WITH CONTRAST TECHNIQUE: Multidetector CT imaging of the chest, abdomen and pelvis was performed following the standard protocol during bolus administration of intravenous contrast. RADIATION DOSE REDUCTION: This exam was performed according to the departmental dose-optimization program which includes automated exposure control, adjustment of the mA and/or kV according to patient size and/or use of iterative reconstruction technique. CONTRAST:  65m OMNIPAQUE IOHEXOL 300 MG/ML  SOLN COMPARISON:  The chest abdomen pelvis dated 03/08/2021. FINDINGS: CT CHEST FINDINGS Cardiovascular: There is no cardiomegaly or pericardial effusion. There is coronary vascular calcification and postsurgical changes of CABG. Mild atherosclerotic calcification of the thoracic aorta. No aneurysmal dilatation or dissection. The origins of the great vessels of the aortic arch and the central pulmonary arteries appear patent. Mediastinum/Nodes: No hilar or mediastinal adenopathy. The esophagus is grossly unremarkable. No mediastinal fluid collection. Lungs/Pleura: Biapical subpleural scarring. No new consolidation. There is no pleural effusion or pneumothorax. The central airways are patent. Musculoskeletal: Median sternotomy wires. No acute osseous pathology. CT ABDOMEN PELVIS FINDINGS No intra-abdominal free air or  free fluid. Hepatobiliary: The liver is unremarkable. No biliary ductal dilatation. The gallbladder is contracted. No calcified gallstone. Pancreas: Similar appearance of a 6 mm hypodense focus in the tail of the pancreas (68/3) likely representing interspersed fat versus less likely a small side branch IPMN. Attention on follow-up imaging recommended. No dilatation of the main pancreatic duct or gland atrophy. No active inflammatory changes. Spleen: Normal in size without focal abnormality. Adrenals/Urinary Tract: The adrenal glands are unremarkable. There is a 6.5 cm left renal upper pole cyst. Interval passage of the previously seen dominant stone in the right renal pelvis. A cluster of small stone fragments in the inferior pole of the right kidney likely residual from the stone seen in the renal pelvis on the prior CT. There is mild right hydronephrosis, slightly improved since the prior CT. Chronic urothelial thickening and haziness of the right renal pelvis. Similar appearance of the right renal inferior pole cyst. There is no hydronephrosis on the left. There is symmetric enhancement and excretion of contrast by both kidneys. The visualized ureters and urinary bladder appear unremarkable. Stomach/Bowel: Postsurgical changes of right hemicolectomy with ileocolic anastomosis in the right hemiabdomen. There is sigmoid diverticulosis without active inflammatory changes. There is no bowel obstruction or active inflammation. Appendectomy. Vascular/Lymphatic: Advanced aortoiliac atherosclerotic disease. The IVC is unremarkable. No portal venous gas. There is no adenopathy. A 1.4 cm rim calcified splenic artery aneurysm similar to prior CT. Reproductive: Hysterectomy.  No adnexal masses. Other: Midline vertical anterior pelvic wall incisional scar. Musculoskeletal: Osteopenia with degenerative changes of the spine and hips. No acute osseous pathology. IMPRESSION: 1. No acute intrathoracic, abdominal, or pelvic  pathology. No evidence of recurrent or metastatic disease. 2. Interval passage of the previously seen dominant stone in the right renal pelvis. A cluster of small stone fragments in the inferior pole  of the right kidney likely residual from the stone seen in the renal pelvis on the prior CT. There is mild right hydronephrosis, slightly improved since the prior CT. 3. Sigmoid diverticulosis. No bowel obstruction. 4. A 1.4 cm rim calcified splenic artery aneurysm, similar to prior CT. 5. Similar appearance of a 6 mm hypodense focus in the tail of the pancreas likely representing interspersed fat versus less likely a small side branch IPMN. Attention on follow-up imaging recommended. 6.  Aortic Atherosclerosis (ICD10-I70.0). Electronically Signed   By: Anner Crete M.D.   On: 02/13/2022 20:56     Assessment and plan- Patient is a 86 y.o. female with history of stage IIIc adenocarcinoma of the colon s/p 6 cycles of adjuvant Xeloda.  She is currently in remission and this is a routine follow-up visit  I have reviewed CT chestAbdomen pelvis images independently and discussed findings with the patient which does not show any evidence of recurrent or progressive disease.  Dominant stone in the right renal pelvis has also passed.  6 mm hypodense focus in the tail of the pancreas is overall stable.  I will plan to continue doing her surveillance scans every 6 to 12 months.  I will see her back in 4 months with CBC with differential CMP and CEA   Visit Diagnosis 1. Encounter for follow-up surveillance of colon cancer      Dr. Randa Evens, MD, MPH [facility] [phone] 02/16/2022 4:22 PM

## 2022-02-16 NOTE — Progress Notes (Signed)
Patient doing  great

## 2022-02-17 LAB — CEA: CEA: 5.5 ng/mL — ABNORMAL HIGH (ref 0.0–4.7)

## 2022-03-02 ENCOUNTER — Ambulatory Visit (INDEPENDENT_AMBULATORY_CARE_PROVIDER_SITE_OTHER): Payer: Medicare PPO

## 2022-03-02 DIAGNOSIS — E538 Deficiency of other specified B group vitamins: Secondary | ICD-10-CM

## 2022-03-02 MED ORDER — CYANOCOBALAMIN 1000 MCG/ML IJ SOLN
1000.0000 ug | Freq: Once | INTRAMUSCULAR | Status: AC
  Administered 2022-03-02: 1000 ug via INTRAMUSCULAR

## 2022-03-03 IMAGING — CR DG RIBS 2V*L*
5 series · 5 of 5 positions shown · non-contrast
Comparison: None.

CLINICAL DATA: Left rib pain

EXAM:
LEFT RIBS - 2 VIEW

[rib pa (1 of 3)]
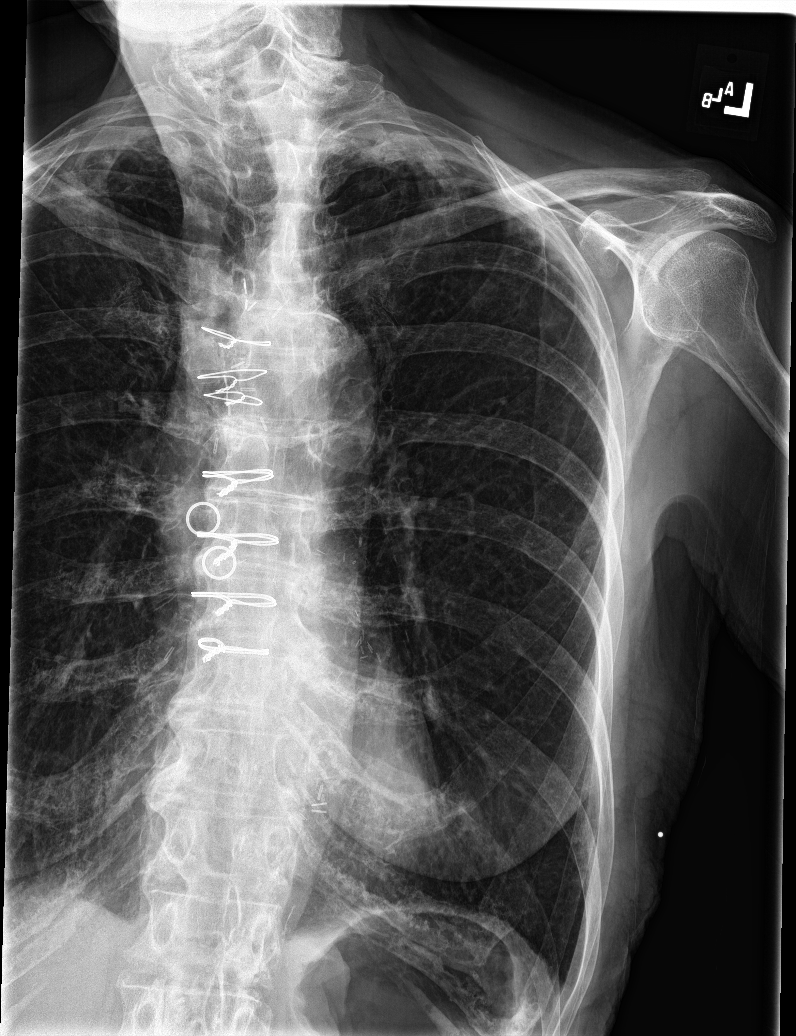

[rib pa (2 of 3)]
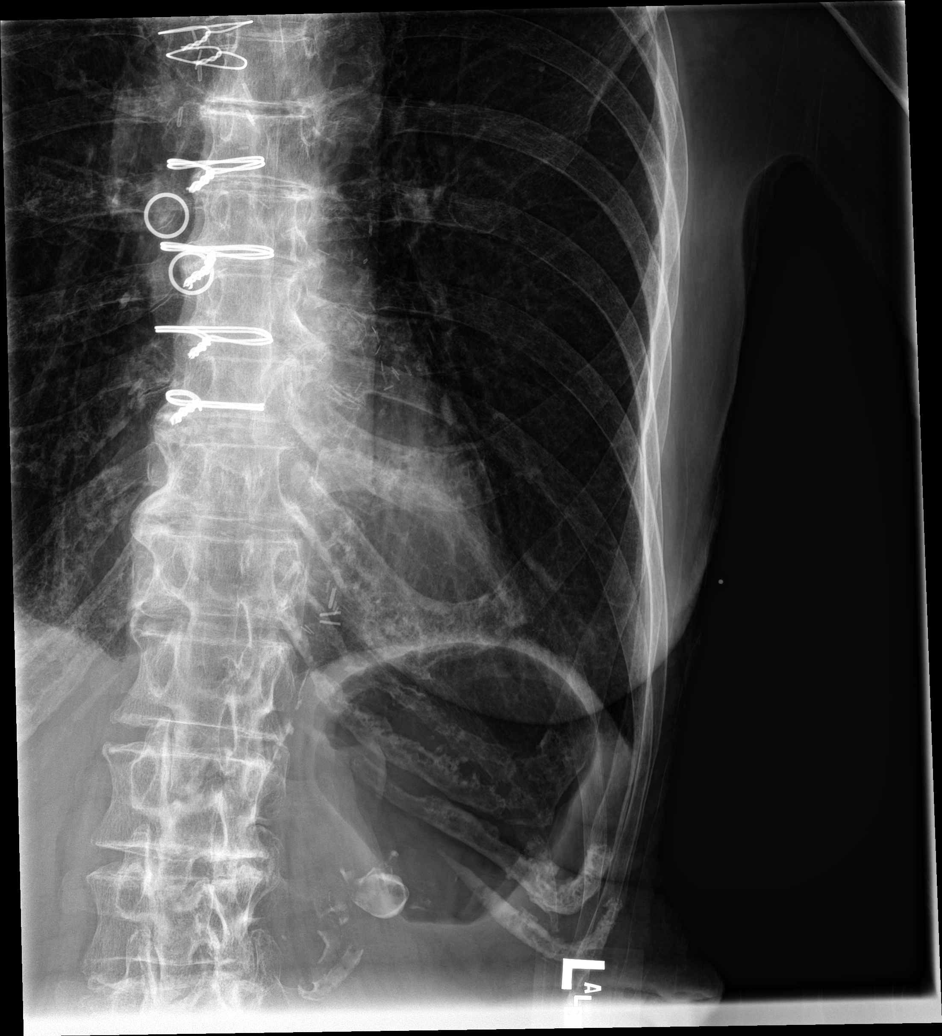

[rib obl (1 of 2)]
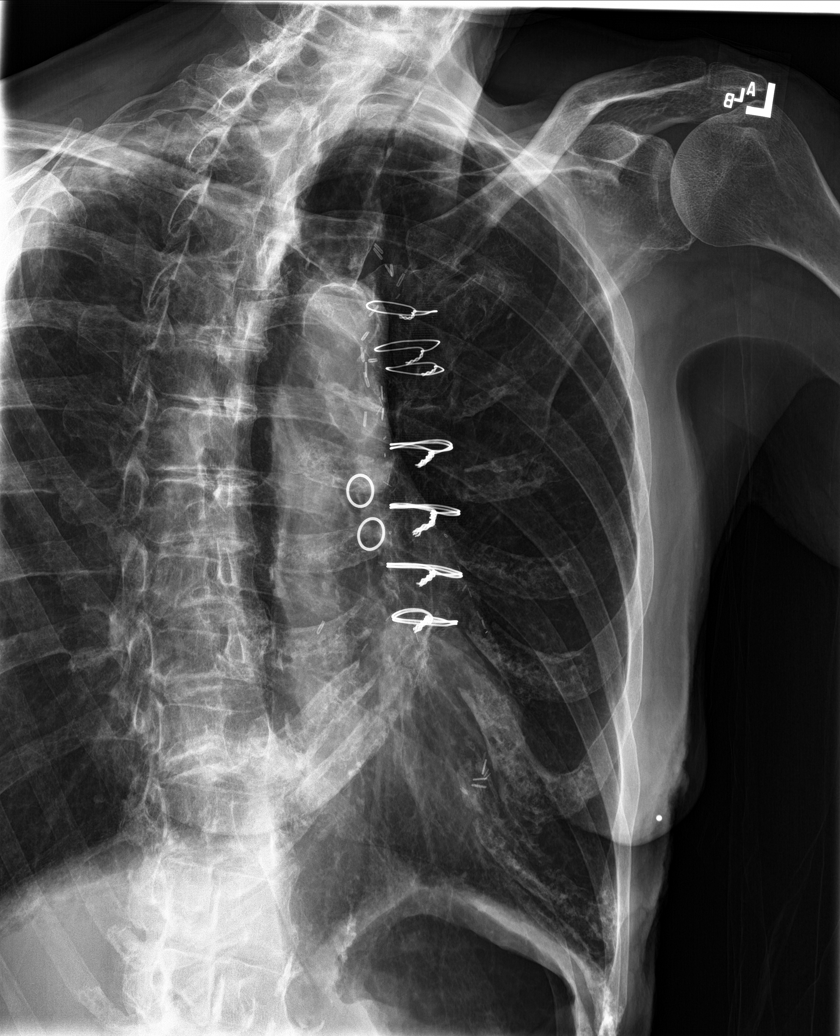

[rib obl (2 of 2)]
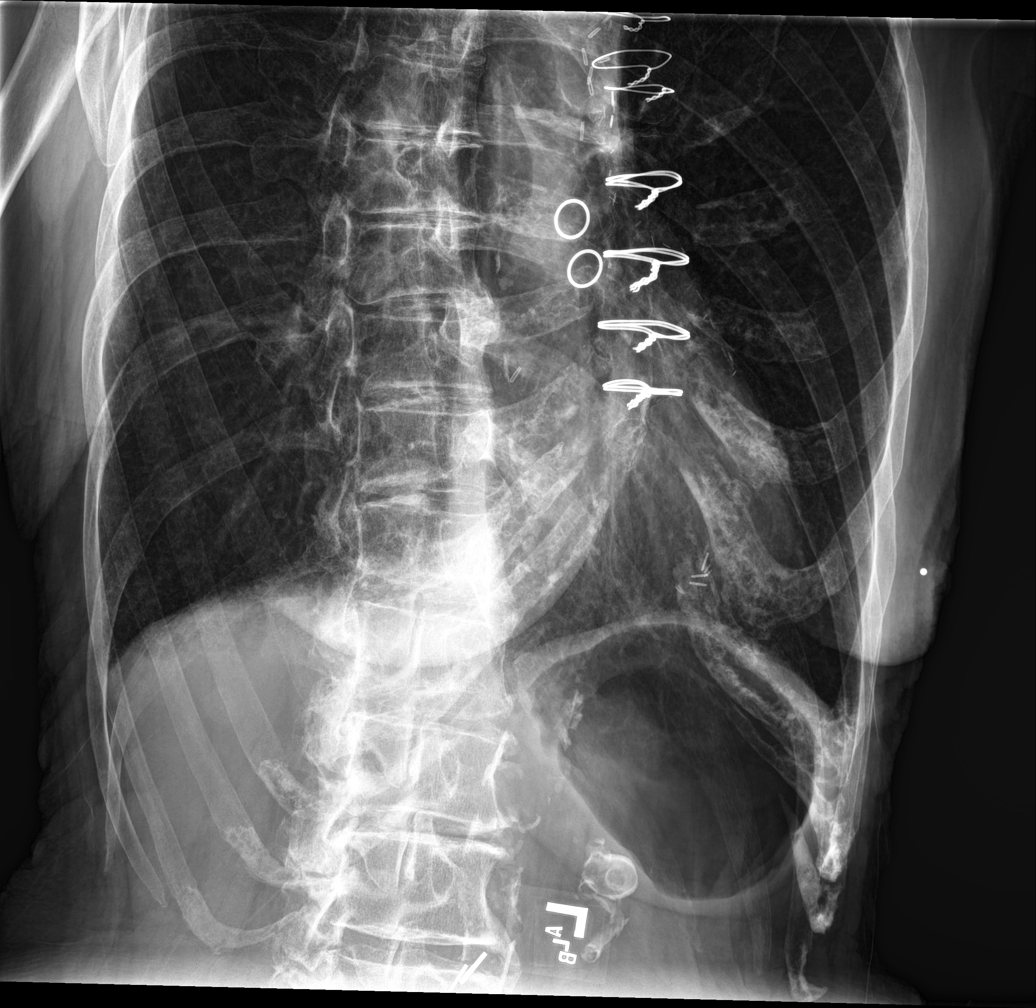

[rib pa (3 of 3)]
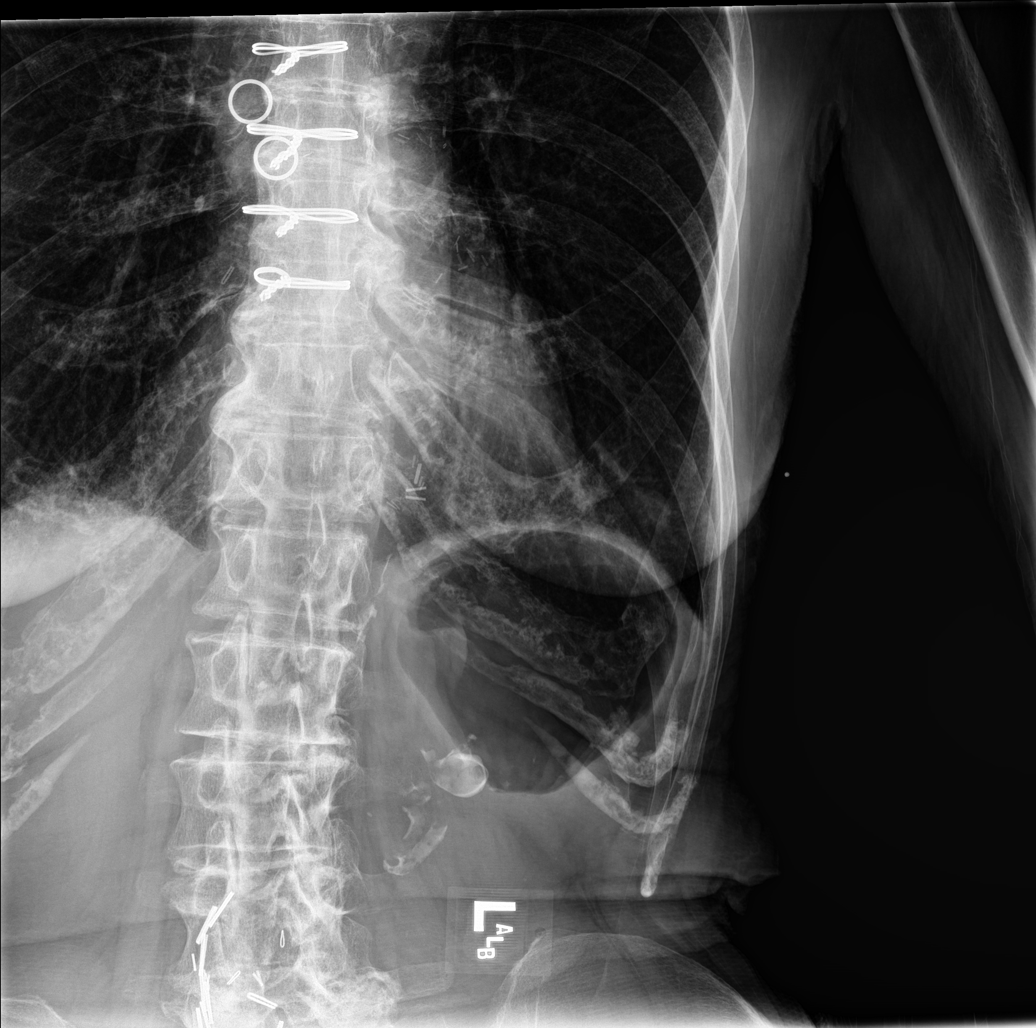

[5 of 5 positions shown; findings below may reference images not displayed]

FINDINGS: No fracture or other bone lesions are seen involving the ribs. Left
apical pleuroparenchymal scarring. The left hemithorax appears
hyperinflated suggesting changes of COPD. Coronary artery bypass
grafting has been performed. 19 mm rim calcified aneurysm identified
within the left upper quadrant, better visualized on prior CT
examination of 08/05/2020.
IMPRESSION: No acute fracture or focal rib lesion identified.

## 2022-03-11 NOTE — Telephone Encounter (Unsigned)
Copied from [place] [id number]. Topic: General - Inquiry >> Mar 11, 2022 10:02 AM Penni Bombard wrote: Reason for CRM: pt called asking Baxter Flattery to give her a call back about her dry lips.  CB# [phone]

## 2022-04-01 ENCOUNTER — Ambulatory Visit: Payer: Medicare PPO | Admitting: Family Medicine

## 2022-04-01 ENCOUNTER — Encounter: Payer: Self-pay | Admitting: Family Medicine

## 2022-04-01 VITALS — BP 120/78 | HR 74 | Ht 65.0 in | Wt 105.0 lb

## 2022-04-01 DIAGNOSIS — J45901 Unspecified asthma with (acute) exacerbation: Secondary | ICD-10-CM

## 2022-04-01 DIAGNOSIS — E7849 Other hyperlipidemia: Secondary | ICD-10-CM

## 2022-04-01 DIAGNOSIS — D509 Iron deficiency anemia, unspecified: Secondary | ICD-10-CM

## 2022-04-01 DIAGNOSIS — F419 Anxiety disorder, unspecified: Secondary | ICD-10-CM | POA: Diagnosis not present

## 2022-04-01 MED ORDER — ATORVASTATIN CALCIUM 80 MG PO TABS: ORAL_TABLET | ORAL | 1 refills | Status: DC

## 2022-04-01 MED ORDER — ALPRAZOLAM 0.25 MG PO TABS: 0.2500 mg | ORAL_TABLET | Freq: Every day | ORAL | 0 refills | Status: DC | PRN

## 2022-04-01 MED ORDER — FERROUS SULFATE 325 (65 FE) MG PO TABS: 325.0000 mg | ORAL_TABLET | Freq: Every day | ORAL | 1 refills | Status: DC

## 2022-04-01 MED ORDER — MONTELUKAST SODIUM 10 MG PO TABS: ORAL_TABLET | ORAL | 1 refills | Status: DC

## 2022-04-01 NOTE — Progress Notes (Signed)
Date:  04/01/2022   Name:  Phyllis Ross   DOB:  [birth year]   MRN:  [id number]   Chief Complaint: Hyperlipidemia, Anemia, Allergic Rhinitis , and Anxiety  Hyperlipidemia This is a chronic problem. The current episode started more than 1 year ago. The problem is controlled. Recent lipid tests were reviewed and are normal. Pertinent negatives include no chest pain, focal sensory loss, focal weakness, leg pain, myalgias or shortness of breath. Current antihyperlipidemic treatment includes statins. The current treatment provides moderate improvement of lipids. There are no compliance problems.  There are no known risk factors for coronary artery disease.  Anemia Presents for follow-up visit. Symptoms include malaise/fatigue. There has been no abdominal pain, bruising/bleeding easily, fever, light-headedness, pallor, palpitations or paresthesias. (Baseline fatigue level stable) Signs of blood loss that are not present include hematochezia and vaginal bleeding. There are no compliance problems.   Anxiety Presents for follow-up visit. Symptoms include excessive worry and nervous/anxious behavior. Patient reports no chest pain, depressed mood, dizziness, hyperventilation, impotence, insomnia, irritability, nausea, palpitations, restlessness or shortness of breath. Symptoms occur occasionally.   Her past medical history is significant for anemia.    Lab Results  Component Value Date   NA 138 02/16/2022   K 3.6 02/16/2022   CO2 29 02/16/2022   GLUCOSE 125 (H) 02/16/2022   BUN 21 02/16/2022   CREATININE 0.87 02/16/2022   CALCIUM 9.2 02/16/2022   GFRNONAA >60 02/16/2022   Lab Results  Component Value Date   CHOL 118 10/07/2020   HDL 43 10/07/2020   LDLCALC 53 10/07/2020   TRIG 109 10/07/2020   CHOLHDL 2.7 10/07/2020   No results found for: "TSH" Lab Results  Component Value Date   HGBA1C 5.5 04/07/2020   Lab Results  Component Value Date   WBC 7.5 02/16/2022   HGB 11.0 (L)  02/16/2022   HCT 36.6 02/16/2022   MCV 69.4 (L) 02/16/2022   PLT 218 02/16/2022   Lab Results  Component Value Date   ALT 80 (H) 02/16/2022   AST 51 (H) 02/16/2022   ALKPHOS 65 02/16/2022   BILITOT 1.4 (H) 02/16/2022   No results found for: "25OHVITD2", "25OHVITD3", "VD25OH"   Review of Systems  Constitutional:  Positive for malaise/fatigue. Negative for chills, fatigue, fever, irritability and unexpected weight change.  HENT:  Negative for congestion, ear discharge, ear pain, postnasal drip, sinus pressure, sneezing and sore throat.   Respiratory:  Negative for cough, shortness of breath, wheezing and stridor.   Cardiovascular:  Negative for chest pain, palpitations and leg swelling.  Gastrointestinal:  Negative for abdominal pain, blood in stool, hematochezia and nausea.  Genitourinary:  Negative for dysuria, flank pain, frequency, hematuria, impotence, urgency, vaginal bleeding and vaginal discharge.  Musculoskeletal:  Negative for arthralgias, back pain and myalgias.  Skin:  Negative for pallor and rash.  Neurological:  Negative for dizziness, focal weakness, weakness, light-headedness, headaches and paresthesias.  Hematological:  Negative for adenopathy. Does not bruise/bleed easily.  Psychiatric/Behavioral:  Negative for dysphoric mood. The patient is nervous/anxious. The patient does not have insomnia.     Patient Active Problem List   Diagnosis Date Noted   History of colon cancer    Polyp of transverse colon    B12 deficiency 09/16/2020   Goals of care, counseling/discussion 08/07/2020   Microcytic anemia 07/23/2020   Status post partial colectomy 07/02/2020   Protein-calorie malnutrition, severe 06/24/2020   Cecal cancer ([place]) 06/15/2020   Iron deficiency anemia due to chronic blood  loss    Neoplasm of lower gastrointestinal tract    Acute gastritis with hemorrhage    Personal history of other malignant neoplasm of skin 01/21/2019   Atherosclerosis of abdominal  aorta ([place]) 12/26/2017   Age-related osteoporosis without current pathological fracture 06/16/2016   Encounter for follow-up surveillance of ovarian cancer 05/20/2015   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   Wedging of vertebra ([place]) 10/13/2014   Polypharmacy 10/13/2014   Encounter for general adult medical examination without abnormal findings 10/13/2014   Anxiety 10/13/2014   Essential hypertension 08/27/2014   Bradycardia 08/27/2014   Coronary artery disease of native heart with stable angina pectoris ([place]) 08/27/2014   MI (mitral incompetence) 08/27/2014   TI (tricuspid incompetence) 08/27/2014   Combined fat and carbohydrate induced hyperlipemia 08/13/2014   HTN (hypertension), malignant 08/11/2014   Chest pain 08/11/2014   CAD (coronary artery disease) of artery bypass graft 08/11/2014    Allergies  Allergen Reactions   Trazodone And Nefazodone Other (See Comments)    Made her pass out    Past Surgical History:  Procedure Laterality Date   COLONOSCOPY  04/04/2009   normal- Dr Vira Agar   COLONOSCOPY WITH PROPOFOL N/A 06/04/2020   Procedure: COLONOSCOPY WITH PROPOFOL;  Surgeon: Lucilla Lame, MD;  Location: [facility];  Service: Endoscopy;  Laterality: N/A;   COLONOSCOPY WITH PROPOFOL N/A 01/31/2022   Procedure: COLONOSCOPY WITH PROPOFOL;  Surgeon: Lucilla Lame, MD;  Location: [facility];  Service: Endoscopy;  Laterality: N/A;   CORONARY ARTERY BYPASS GRAFT N/A 08/14/2006   Procedure: CORONARY ARTERY BYPASS GRAFT; Location: [facility]; Surgeon: Walker Shadow, MD   CYSTOSCOPY WITH RETROGRADE PYELOGRAM, URETEROSCOPY AND STENT PLACEMENT Right 11/12/2021   Procedure: CYSTOSCOPY WITH RETROGRADE PYELOGRAM / RIGHT URETEROSCOPY/HOLMIUM LASER/RIGHT STENT EXCHANGE;  Surgeon: Billey Co, MD;  Location: [facility];  Service: Urology;  Laterality: Right;   CYSTOSCOPY/URETEROSCOPY/HOLMIUM LASER/STENT PLACEMENT Right 10/29/2021   Procedure: CYSTOSCOPY/  RIGHT STENT PLACEMENT;  Surgeon: Billey Co, MD;  Location: [facility];  Service: Urology;  Laterality: Right;   ESOPHAGOGASTRODUODENOSCOPY (EGD) WITH PROPOFOL N/A 06/04/2020   Procedure: ESOPHAGOGASTRODUODENOSCOPY (EGD) WITH PROPOFOL;  Surgeon: Lucilla Lame, MD;  Location: [facility];  Service: Endoscopy;  Laterality: N/A;   FRACTURE SURGERY     ankle left   LEFT HEART CATH AND CORONARY ANGIOGRAPHY Left 08/14/2006   Procedure: LEFT HEART CATHETERIZATION AND CORONARY ANGIOGRAPHY; Location: [facility]: Surgeon: Katrine Coho, MD   VAGINAL HYSTERECTOMY     DUB    Social History   Tobacco Use   Smoking status: Never    Passive exposure: Never   Smokeless tobacco: Never  Vaping Use   Vaping Use: Never used  Substance Use Topics   Alcohol use: No   Drug use: No     Medication list has been reviewed and updated.  Current Meds  Medication Sig   atorvastatin (LIPITOR) 80 MG tablet TAKE (1) TABLET BY MOUTH EVERY DAY   Cyanocobalamin (VITAMIN B-12 IJ) Inject 1,000 vials as directed every 30 (thirty) days. Patient Not sure of dosage   cyanocobalamin (VITAMIN B12) 1000 MCG tablet Take 1 tablet (1,000 mcg total) by mouth daily.   ferrous sulfate 325 (65 FE) MG tablet Take 1 tablet (325 mg total) by mouth daily with breakfast.   montelukast (SINGULAIR) 10 MG tablet TAKE (1) TABLET BY MOUTH DAILY AT BEDTIME   Nutritional Supplements (ENSURE COMPLETE PO) Take 1 Can by mouth daily.   potassium chloride SA (KLOR-CON M) 20 MEQ tablet TAKE (1)  TABLET BY MOUTH EVERY DAY       04/01/2022    2:35 PM 01/17/2022    3:14 PM 04/08/2021    2:06 PM 07/20/2020    2:04 PM  GAD 7 : Generalized Anxiety Score  Nervous, Anxious, on Edge 2 0 0 2  Control/stop worrying 2 0 0 2  Worry too much - different things 2 0 0 2  Trouble relaxing 0 0 0 0  Restless 0 0 0 0  Easily annoyed or irritable 1 0 0 1  Afraid - awful might happen 1 0 0 1  Total GAD 7 Score 8 0 0 8  Anxiety Difficulty Somewhat  difficult Not difficult at all Not difficult at all Not difficult at all       04/01/2022    2:32 PM 02/09/2022    3:07 PM 01/17/2022    3:14 PM  Depression screen PHQ 2/9  Decreased Interest 1 0 0  Down, Depressed, Hopeless 2 0 0  PHQ - 2 Score 3 0 0  Altered sleeping 0 0 0  Tired, decreased energy 0 0 0  Change in appetite 0 0 0  Feeling bad or failure about yourself  0 0 0  Trouble concentrating 0 0 0  Moving slowly or fidgety/restless 0 0 0  Suicidal thoughts 0 0 0  PHQ-9 Score 3 0 0  Difficult doing work/chores Not difficult at all Not difficult at all Not difficult at all    BP Readings from Last 3 Encounters:  04/01/22 120/78  02/16/22 (!) 142/59  01/31/22 (!) 130/58    Physical Exam Vitals and nursing note reviewed. Exam conducted with a chaperone present.  Constitutional:      General: She is not in acute distress.    Appearance: She is not diaphoretic.  HENT:     Head: Normocephalic and atraumatic.     Right Ear: Tympanic membrane and external ear normal.     Left Ear: Tympanic membrane and external ear normal.     Nose: Nose normal.     Mouth/Throat:     Mouth: Mucous membranes are moist.  Eyes:     General:        Right eye: No discharge.        Left eye: No discharge.     Conjunctiva/sclera: Conjunctivae normal.     Pupils: Pupils are equal, round, and reactive to light.  Neck:     Thyroid: No thyromegaly.     Vascular: No JVD.  Cardiovascular:     Rate and Rhythm: Normal rate and regular rhythm.     Heart sounds: Normal heart sounds. No murmur heard.    No friction rub. No gallop.  Pulmonary:     Effort: Pulmonary effort is normal.     Breath sounds: Normal breath sounds.  Abdominal:     General: Bowel sounds are normal.     Palpations: Abdomen is soft. There is no mass.     Tenderness: There is no abdominal tenderness. There is no guarding.  Musculoskeletal:        General: Normal range of motion.     Cervical back: Normal range of motion  and neck supple.  Lymphadenopathy:     Cervical: No cervical adenopathy.  Skin:    General: Skin is warm and dry.  Neurological:     Mental Status: She is alert.     Deep Tendon Reflexes: Reflexes are normal and symmetric.     Wt Readings from Last  3 Encounters:  04/01/22 105 lb (47.6 kg)  02/16/22 107 lb 8 oz (48.8 kg)  01/31/22 105 lb 8 oz (47.9 kg)    BP 120/78   Pulse 74   Ht '5\' 5"'$  (1.651 m)   Wt 105 lb (47.6 kg)   SpO2 96%   BMI 17.47 kg/m   Assessment and Plan:  1. Familial multiple lipoprotein-type hyperlipidemia Chronic.  Controlled.  Stable.  Continue atorvastatin 80 mg once a day.  Will check lipid panel for current level of control. - atorvastatin (LIPITOR) 80 MG tablet; TAKE (1) TABLET BY MOUTH EVERY DAY  Dispense: 90 tablet; Refill: 1 - Lipid Panel With LDL/HDL Ratio  2. Microcytic anemia Chronic.  Controlled.  Stable.  Tolerating ferrous sulfate 325 mg daily we will continue at current daily dosing. - ferrous sulfate 325 (65 FE) MG tablet; Take 1 tablet (325 mg total) by mouth daily with breakfast.  Dispense: 90 tablet; Refill: 1  3. Bronchitis, allergic, unspecified asthma severity, with acute exacerbation Chronic.  Controlled.  Stable.  On as-needed basis for allergic bronchitis and reactive airway disease will continue Singulair 10 mg once a day. - montelukast (SINGULAIR) 10 MG tablet; TAKE (1) TABLET BY MOUTH DAILY AT BEDTIME  Dispense: 90 tablet; Refill: 1  4. Anxiety Mostly a reactive anxiety due to spouses medical condition and son's surgical recovery.  Refill alprazolam 0.25 as needed anxiety not to exceed 1 a day. - ALPRAZolam (XANAX) 0.25 MG tablet; Take 1 tablet (0.25 mg total) by mouth daily as needed for anxiety.  Dispense: 20 tablet; Refill: 0    Otilio Miu, MD

## 2022-04-02 LAB — LIPID PANEL WITH LDL/HDL RATIO
Cholesterol, Total: 126 mg/dL (ref 100–199)
HDL: 63 mg/dL (ref >39)
LDL Chol Calc (NIH): 48 mg/dL (ref 0–99)
LDL/HDL Ratio: 0.8 ratio (ref 0.0–3.2)
Triglycerides: 75 mg/dL (ref 0–149)
VLDL Cholesterol Cal: 15 mg/dL (ref 5–40)

## 2022-04-06 ENCOUNTER — Ambulatory Visit (INDEPENDENT_AMBULATORY_CARE_PROVIDER_SITE_OTHER): Payer: Medicare PPO

## 2022-04-06 DIAGNOSIS — E538 Deficiency of other specified B group vitamins: Secondary | ICD-10-CM | POA: Diagnosis not present

## 2022-04-06 MED ORDER — CYANOCOBALAMIN 1000 MCG/ML IJ SOLN
1000.0000 ug | Freq: Once | INTRAMUSCULAR | Status: AC
  Administered 2022-04-06: 1000 ug via INTRAMUSCULAR

## 2022-04-28 ENCOUNTER — Telehealth: Payer: Self-pay | Admitting: *Deleted

## 2022-04-28 NOTE — Progress Notes (Signed)
  Care Coordination  Outreach Note  04/28/2022 Name: Phyllis Ross MRN: [id number] DOB: 04-29-1935   Care Coordination Outreach Attempts: An unsuccessful telephone outreach was attempted today to offer the patient information about available care coordination services as a benefit of their health plan.   Follow Up Plan:  Additional outreach attempts will be made to offer the patient care coordination information and services.   Encounter Outcome:  Pt. Request to Call [place], [place] Direct Dial: [phone]

## 2022-05-02 NOTE — Progress Notes (Signed)
  Care Coordination   Note   05/02/2022 Name: ODEAL WELDEN MRN: [id number] DOB: 04-Jan-1936  JANEICE STEGALL is a 87 y.o. year old female who sees Juline Patch, MD for primary care. I reached out to Charlann Boxer by phone today to offer care coordination services.  Ms. Kercheval was given information about Care Coordination services today including:   The Care Coordination services include support from the care team which includes your Nurse Coordinator, Clinical Social Worker, or Pharmacist.  The Care Coordination team is here to help remove barriers to the health concerns and goals most important to you. Care Coordination services are voluntary, and the patient may decline or stop services at any time by request to their care team member.   Care Coordination Consent Status: Patient agreed to services and verbal consent obtained.   Follow up plan:  Telephone appointment with care coordination team member scheduled for:  05/05/2022  Encounter Outcome:  Pt. Scheduled  Julian Hy, [place] Direct Dial: [phone]

## 2022-05-05 ENCOUNTER — Ambulatory Visit: Payer: Self-pay | Admitting: *Deleted

## 2022-05-05 NOTE — Patient Outreach (Signed)
  Care Coordination   05/05/2022 Name: Phyllis Ross MRN: [id number] DOB: 1935/09/08   Care Coordination Outreach Attempts:  An unsuccessful telephone outreach was attempted for a scheduled appointment today.  Follow Up Plan:  Additional outreach attempts will be made to offer the patient care coordination information and services.   Encounter Outcome:  No Answer   Care Coordination Interventions:  No, not indicated    Valente David, RN, MSN, [facility] Care Management Care Management Coordinator [phone]

## 2022-05-06 DIAGNOSIS — C44612 Basal cell carcinoma of skin of right upper limb, including shoulder: Secondary | ICD-10-CM | POA: Diagnosis not present

## 2022-05-06 DIAGNOSIS — Z7189 Other specified counseling: Secondary | ICD-10-CM | POA: Diagnosis not present

## 2022-05-06 DIAGNOSIS — L821 Other seborrheic keratosis: Secondary | ICD-10-CM | POA: Diagnosis not present

## 2022-05-06 DIAGNOSIS — D485 Neoplasm of uncertain behavior of skin: Secondary | ICD-10-CM | POA: Diagnosis not present

## 2022-05-06 DIAGNOSIS — D0439 Carcinoma in situ of skin of other parts of face: Secondary | ICD-10-CM | POA: Diagnosis not present

## 2022-05-06 DIAGNOSIS — X32XXXA Exposure to sunlight, initial encounter: Secondary | ICD-10-CM | POA: Diagnosis not present

## 2022-05-06 DIAGNOSIS — L570 Actinic keratosis: Secondary | ICD-10-CM | POA: Diagnosis not present

## 2022-05-06 DIAGNOSIS — C44319 Basal cell carcinoma of skin of other parts of face: Secondary | ICD-10-CM | POA: Diagnosis not present

## 2022-05-06 DIAGNOSIS — D0470 Carcinoma in situ of skin of unspecified lower limb, including hip: Secondary | ICD-10-CM | POA: Diagnosis not present

## 2022-05-06 DIAGNOSIS — D044 Carcinoma in situ of skin of scalp and neck: Secondary | ICD-10-CM | POA: Diagnosis not present

## 2022-05-09 ENCOUNTER — Ambulatory Visit (INDEPENDENT_AMBULATORY_CARE_PROVIDER_SITE_OTHER): Payer: Medicare PPO

## 2022-05-09 ENCOUNTER — Telehealth: Payer: Self-pay | Admitting: *Deleted

## 2022-05-09 DIAGNOSIS — E538 Deficiency of other specified B group vitamins: Secondary | ICD-10-CM | POA: Diagnosis not present

## 2022-05-09 MED ORDER — CYANOCOBALAMIN 1000 MCG/ML IJ SOLN
1000.0000 ug | Freq: Once | INTRAMUSCULAR | Status: AC
  Administered 2022-05-09: 1000 ug via INTRAMUSCULAR

## 2022-05-09 NOTE — Progress Notes (Signed)
  Care Coordination Note  05/09/2022 Name: Phyllis Ross MRN: [id number] DOB: 1935-10-17  Phyllis Ross is a 87 y.o. year old female who is a primary care patient of Juline Patch, MD and is actively engaged with the care management team. I reached out to Charlann Boxer by phone today to assist with re-scheduling an initial visit with the RN Case Manager  Follow up plan: Unsuccessful telephone outreach attempt made. A HIPAA compliant phone message was left for the patient providing contact information and requesting a return call.   Julian Hy, [place] Direct Dial: [phone]

## 2022-05-11 ENCOUNTER — Telehealth: Payer: Self-pay | Admitting: Family Medicine

## 2022-05-11 NOTE — Telephone Encounter (Signed)
Copied from [place] [id number]. Topic: General - Other >> May 11, 2022  8:52 AM Oley Balm A wrote: Reason for CRM: Pt is wanting to let Baxter Flattery know 87yrold SArizona Constablehas passed away. Pt would like for TBaxter Flatteryto give her a call back when she can.

## 2022-05-24 ENCOUNTER — Other Ambulatory Visit: Payer: Self-pay | Admitting: Nurse Practitioner

## 2022-05-24 DIAGNOSIS — E876 Hypokalemia: Secondary | ICD-10-CM

## 2022-06-03 NOTE — Progress Notes (Signed)
  Care Coordination Note  06/03/2022 Name: Phyllis Ross MRN: [id number] DOB: 08-Sep-1935  Phyllis Ross is a 87 y.o. year old female who is a primary care patient of Juline Patch, MD and is actively engaged with the care management team. I reached out to Charlann Boxer by phone today to assist with re-scheduling an initial visit with the RN Case Manager  Follow up plan: Telephone appointment with care management team member scheduled for: 06/22/2022  Julian Hy, [place] Direct Dial: [phone]

## 2022-06-07 ENCOUNTER — Ambulatory Visit: Payer: Medicare PPO | Admitting: Urology

## 2022-06-08 ENCOUNTER — Ambulatory Visit
Admission: RE | Admit: 2022-06-08 | Discharge: 2022-06-08 | Disposition: A | Discharge status: Home / Self Care | Payer: Medicare PPO | Attending: Urology | Admitting: Urology

## 2022-06-08 ENCOUNTER — Encounter: Payer: Self-pay | Admitting: Urology

## 2022-06-08 ENCOUNTER — Ambulatory Visit
Admission: RE | Admit: 2022-06-08 | Discharge: 2022-06-08 | Disposition: A | Discharge status: Home / Self Care | Payer: Medicare PPO | Source: Ambulatory Visit | Attending: Urology | Admitting: Urology

## 2022-06-08 ENCOUNTER — Ambulatory Visit: Payer: Medicare PPO | Admitting: Urology

## 2022-06-08 VITALS — BP 136/72 | HR 70 | Ht 65.0 in | Wt 105.0 lb

## 2022-06-08 DIAGNOSIS — N200 Calculus of kidney: Secondary | ICD-10-CM | POA: Insufficient documentation

## 2022-06-08 DIAGNOSIS — Z09 Encounter for follow-up examination after completed treatment for conditions other than malignant neoplasm: Secondary | ICD-10-CM

## 2022-06-08 DIAGNOSIS — Z87442 Personal history of urinary calculi: Secondary | ICD-10-CM

## 2022-06-08 NOTE — Progress Notes (Signed)
   06/08/2022 3:54 PM   Phyllis Ross October 17, 1935 [id number]  Reason for visit: Follow up nephrolithiasis  HPI: 87 year old female who underwent right-sided ureteroscopy and laser lithotripsy for a large 2 cm UPJ stone on 11/12/2021, and stent was removed 2 weeks later.  She has not had any problems since that time.  She denies any flank pain, gross hematuria, or UTIs.  I personally viewed and interpreted the CT from November 2023 that shows some right lower pole stone dust, and the KUB today that shows even decreased lower pole stone dust.  We discussed general stone prevention strategies including adequate hydration with goal of producing 2.5 L of urine daily, increasing citric acid intake, increasing calcium intake during high oxalate meals, minimizing animal protein, and decreasing salt intake. Information about dietary recommendations given today.   RTC 1 year KUB, if stable can likely follow-up as needed   Billey Co, MD  [place] [address] [place], [place] [address] [phone]

## 2022-06-09 ENCOUNTER — Ambulatory Visit (INDEPENDENT_AMBULATORY_CARE_PROVIDER_SITE_OTHER): Payer: Medicare PPO

## 2022-06-09 ENCOUNTER — Ambulatory Visit: Payer: Medicare PPO

## 2022-06-09 DIAGNOSIS — E538 Deficiency of other specified B group vitamins: Secondary | ICD-10-CM | POA: Diagnosis not present

## 2022-06-09 MED ORDER — CYANOCOBALAMIN 1000 MCG/ML IJ SOLN
1000.0000 ug | Freq: Once | INTRAMUSCULAR | Status: AC
  Administered 2022-06-09: 1000 ug via INTRAMUSCULAR

## 2022-06-16 ENCOUNTER — Telehealth: Payer: Self-pay | Admitting: Family Medicine

## 2022-06-16 NOTE — Telephone Encounter (Signed)
Copied from [place] [id number]. Topic: General - Other >> Jun 16, 2022  9:42 AM Sabas Sous wrote: Reason for CRM: Pt called wanting to review Xrays recommended by Nickolas Madrid with [facility].

## 2022-06-16 NOTE — Telephone Encounter (Signed)
Called pt informed her of normal KUB. Pt voiced understanding.

## 2022-06-22 ENCOUNTER — Ambulatory Visit: Payer: Self-pay | Admitting: *Deleted

## 2022-06-22 ENCOUNTER — Encounter: Payer: Self-pay | Admitting: *Deleted

## 2022-06-22 NOTE — Patient Outreach (Signed)
  Care Coordination   Initial Visit Note   06/22/2022 Name: Phyllis Ross MRN: [id number] DOB: Dec 21, 1935  Phyllis Ross is a 87 y.o. year old female who sees Juline Patch, MD for primary care. I spoke with  Charlann Boxer by phone today.  What matters to the patients health and wellness today?  Patient state she is doing well but she is overwhelmed as her husband was just discharged home from the hospital and her mother in law passed away.  She declines need for ongoing calls at this time but will call with questions.     Goals Addressed             This Visit's Progress    COMPLETED: Care Coordination Activities - No follow up needed       Care Coordination Interventions: Evaluation of current treatment plan related to management of chronic medical conditions and patient's adherence to plan as established by provider Advised patient to monitor blood pressure daily and to contact this care manager if interested in stress relief Reviewed medications with patient and discussed affordability and adherence Reviewed scheduled/upcoming provider appointments including cancer center on 3/25, cardiology on 6/12, and PCP on 7/8 Discussed plans with patient for ongoing care management follow up and provided patient with direct contact information for care management team Screening for signs and symptoms of depression related to chronic disease state  Assessed social determinant of health barriers         SDOH assessments and interventions completed:  Yes  SDOH Interventions Today    Flowsheet Row Most Recent Value  SDOH Interventions   Food Insecurity Interventions Intervention Not Indicated  Housing Interventions Intervention Not Indicated  Transportation Interventions Intervention Not Indicated        Care Coordination Interventions:  Yes, provided   Interventions Today    Flowsheet Row Most Recent Value  Chronic Disease   Chronic disease during today's visit  Hypertension (HTN)  General Interventions   General Interventions Discussed/Reviewed General Interventions Reviewed, Doctor Visits  Doctor Visits Discussed/Reviewed Doctor Visits Reviewed, PCP  PCP/Specialist Visits Compliance with follow-up visit  Pharmacy Interventions   Pharmacy Dicussed/Reviewed Pharmacy Topics Reviewed        Follow up plan: No further intervention required.   Encounter Outcome:  Pt. Visit Completed   Valente David, RN, MSN, [place] Care Management Care Management Coordinator [phone]

## 2022-06-27 ENCOUNTER — Inpatient Hospital Stay: Payer: Medicare PPO

## 2022-06-27 ENCOUNTER — Encounter: Payer: Self-pay | Admitting: Family Medicine

## 2022-06-27 ENCOUNTER — Inpatient Hospital Stay: Payer: Medicare PPO | Admitting: Oncology

## 2022-07-01 ENCOUNTER — Inpatient Hospital Stay: Payer: Medicare PPO | Attending: Oncology | Admitting: Oncology

## 2022-07-01 ENCOUNTER — Inpatient Hospital Stay: Payer: Medicare PPO

## 2022-07-01 ENCOUNTER — Encounter: Payer: Self-pay | Admitting: Oncology

## 2022-07-01 VITALS — BP 111/76 | HR 60 | Temp 98.3°F | Resp 18 | Wt 105.1 lb

## 2022-07-01 DIAGNOSIS — Z85038 Personal history of other malignant neoplasm of large intestine: Secondary | ICD-10-CM

## 2022-07-01 DIAGNOSIS — Z08 Encounter for follow-up examination after completed treatment for malignant neoplasm: Secondary | ICD-10-CM | POA: Diagnosis not present

## 2022-07-01 DIAGNOSIS — D509 Iron deficiency anemia, unspecified: Secondary | ICD-10-CM | POA: Diagnosis not present

## 2022-07-01 DIAGNOSIS — D561 Beta thalassemia: Secondary | ICD-10-CM | POA: Diagnosis not present

## 2022-07-01 DIAGNOSIS — C180 Malignant neoplasm of cecum: Secondary | ICD-10-CM

## 2022-07-01 LAB — COMPREHENSIVE METABOLIC PANEL
ALT: 70 U/L — ABNORMAL HIGH (ref 0–44)
AST: 49 U/L — ABNORMAL HIGH (ref 15–41)
Albumin: 3.8 g/dL (ref 3.5–5.0)
Alkaline Phosphatase: 67 U/L (ref 38–126)
Anion gap: 8 (ref 5–15)
BUN: 16 mg/dL (ref 8–23)
CO2: 29 mmol/L (ref 22–32)
Calcium: 9.4 mg/dL (ref 8.9–10.3)
Chloride: 105 mmol/L (ref 98–111)
Creatinine, Ser: 0.85 mg/dL (ref 0.44–1.00)
GFR, Estimated: >60 mL/min (ref >60)
Glucose, Bld: 94 mg/dL (ref 70–99)
Potassium: 4.4 mmol/L (ref 3.5–5.1)
Sodium: 142 mmol/L (ref 135–145)
Total Bilirubin: 1.8 mg/dL — ABNORMAL HIGH (ref 0.3–1.2)
Total Protein: 7.4 g/dL (ref 6.5–8.1)

## 2022-07-01 LAB — CBC WITH DIFFERENTIAL/PLATELET
Abs Immature Granulocytes: 0.01 10*3/uL (ref 0.00–0.07)
Basophils Absolute: 0.1 10*3/uL (ref 0.0–0.1)
Basophils Relative: 1 %
Eosinophils Absolute: 0.1 10*3/uL (ref 0.0–0.5)
Eosinophils Relative: 2 %
HCT: 37.7 % (ref 36.0–46.0)
Hemoglobin: 11.6 g/dL — ABNORMAL LOW (ref 12.0–15.0)
Immature Granulocytes: 0 %
Lymphocytes Relative: 23 %
Lymphs Abs: 1.5 10*3/uL (ref 0.7–4.0)
MCH: 21.6 pg — ABNORMAL LOW (ref 26.0–34.0)
MCHC: 30.8 g/dL (ref 30.0–36.0)
MCV: 70.1 fL — ABNORMAL LOW (ref 80.0–100.0)
Monocytes Absolute: 0.5 10*3/uL (ref 0.1–1.0)
Monocytes Relative: 8 %
Neutro Abs: 4.2 10*3/uL (ref 1.7–7.7)
Neutrophils Relative %: 66 %
Platelets: 234 10*3/uL (ref 150–400)
RBC: 5.38 MIL/uL — ABNORMAL HIGH (ref 3.87–5.11)
RDW: 15.4 % (ref 11.5–15.5)
WBC: 6.3 10*3/uL (ref 4.0–10.5)
nRBC: 0 % (ref 0.0–0.2)

## 2022-07-03 ENCOUNTER — Encounter: Payer: Self-pay | Admitting: Oncology

## 2022-07-03 LAB — CEA: CEA: 5.9 ng/mL — ABNORMAL HIGH (ref 0.0–4.7)

## 2022-07-03 NOTE — Progress Notes (Signed)
Hematology/Oncology Consult note [facility]  [phone] [phone]  Patient Care Team: Juline Patch, MD as PCP - General (Family Medicine) Corey Skains, MD as Consulting Physician (Cardiology) Clent Jacks, RN as Oncology Nurse Navigator Ronny Bacon, MD as Consulting Physician (General Surgery) Sindy Guadeloupe, MD as Consulting Physician (Oncology) Valente David, RN as [place] Management   Name of the patient: Phyllis Ross  [id number]  [birth year]   Date of visit: 07/03/22  Diagnosis- stage III cecal adenocarcinoma currently in remission   Chief complaint/ Reason for visit-routine surveillance visit for colon cancer  Heme/Onc history: Patient is a 87 year old female who was 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  She is s/p laparoscopic right hemicolectomy.  Pathology showed 5.2 x 4.3 x 2.7 cm grade 2 moderately differentiated adenocarcinoma with invasion through muscularis propria into pericolorectal tissue.  7 of 27 lymph nodes positive.  Lymphovascular invasion present but no perineural invasion.  MMR negative.  T3N2BMX.  Right hemicolectomy complicated by anastomotic breakdown/leak which was treated with antibiotics.  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  Also has a history of baseline microcytic anemia secondary to beta thalassemia and baseline hemoglobin is between 10-11.   Patient has met with Dr. Mike Gip in the past management chemotherapy options including oral Xeloda has been offered to the patient   Patient completed 8 cycles of adjuvant Xeloda in November 2022.  Patient also has a history of B12 deficiency for which she is on monthly B12 injections  Interval history-patient is doing well for her age.  She has not had any recent infections  or hospitalizations.  Bowel movements are regular and she denies any blood loss in her stool or urine.  Appetite and weight have remained stable  ECOG PS- 1 Pain scale- 0   Review of systems- Review of Systems  Constitutional:  Positive for malaise/fatigue. Negative for chills, fever and weight loss.  HENT:  Negative for congestion, ear discharge and nosebleeds.   Eyes:  Negative for blurred vision.  Respiratory:  Negative for cough, hemoptysis, sputum production, shortness of breath and wheezing.   Cardiovascular:  Negative for chest pain, palpitations, orthopnea and claudication.  Gastrointestinal:  Negative for abdominal pain, blood in stool, constipation, diarrhea, heartburn, melena, nausea and vomiting.  Genitourinary:  Negative for dysuria, flank pain, frequency, hematuria and urgency.  Musculoskeletal:  Negative for back pain, joint pain and myalgias.  Skin:  Negative for rash.  Neurological:  Negative for dizziness, tingling, focal weakness, seizures, weakness and headaches.  Endo/Heme/Allergies:  Does not bruise/bleed easily.  Psychiatric/Behavioral:  Negative for depression and suicidal ideas. The patient does not have insomnia.       Allergies  Allergen Reactions   Trazodone And Nefazodone Other (See Comments)    Made her pass out     Past Medical History:  Diagnosis Date   Adenocarcinoma of cecum ([place]) 06/2020   a.) stage IIIc (G2, (+) LVI, (-) PNI, T3N3BMX) --> s/p RIGHT hemicolectomy + oral capecitabine   Anemia    Anxiety    a.) on BZO (alprazolam) PRN   Aortic atherosclerosis (HCC)    Arthritis    Beta thalassemia (HCC)    Bradycardia    CAD (coronary artery disease) 08/14/2006   a.) STEMI 08/14/2006 --> LHC at [facility] --> EF 45%, 95%  mLAD, 95% OM1, and 75% LV groove artery --> transfer to [facility] for CVTS consult; b.) 3v CABG 08/14/2006   Chronic kidney disease    History of kidney stones    Hyperlipidemia    Hypertension    Osteoporosis    Ovarian abscess     Ovarian cancer ([place]) 2005   a.) stage IIIb adenocarcinoma --> s/p CRS 2005 + 6 cycles of carboplatin/paclitaxel   Pneumonia    as a baby   Rheumatic fever    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   a.) LIMA-LAD, SVG-RI, SVG-OM1   Skin cancer    STEMI (ST elevation myocardial infarction) ([place]) 08/14/2006   a.) LHC at [facility] --> transferred to [facility] for emergent CABG     Past Surgical History:  Procedure Laterality Date   COLONOSCOPY  04/04/2009   normal- Dr Vira Agar   COLONOSCOPY WITH PROPOFOL N/A 06/04/2020   Procedure: COLONOSCOPY WITH PROPOFOL;  Surgeon: Lucilla Lame, MD;  Location: [place];  Service: Endoscopy;  Laterality: N/A;   COLONOSCOPY WITH PROPOFOL N/A 01/31/2022   Procedure: COLONOSCOPY WITH PROPOFOL;  Surgeon: Lucilla Lame, MD;  Location: [place];  Service: Endoscopy;  Laterality: N/A;   CORONARY ARTERY BYPASS GRAFT N/A 08/14/2006   Procedure: CORONARY ARTERY BYPASS GRAFT; Location: [facility]; Surgeon: Walker Shadow, MD   CYSTOSCOPY WITH RETROGRADE PYELOGRAM, URETEROSCOPY AND STENT PLACEMENT Right 11/12/2021   Procedure: CYSTOSCOPY WITH RETROGRADE PYELOGRAM / RIGHT URETEROSCOPY/HOLMIUM LASER/RIGHT STENT EXCHANGE;  Surgeon: Billey Co, MD;  Location: [facility];  Service: Urology;  Laterality: Right;   CYSTOSCOPY/URETEROSCOPY/HOLMIUM LASER/STENT PLACEMENT Right 10/29/2021   Procedure: CYSTOSCOPY/ RIGHT STENT PLACEMENT;  Surgeon: Billey Co, MD;  Location: [facility];  Service: Urology;  Laterality: Right;   ESOPHAGOGASTRODUODENOSCOPY (EGD) WITH PROPOFOL N/A 06/04/2020   Procedure: ESOPHAGOGASTRODUODENOSCOPY (EGD) WITH PROPOFOL;  Surgeon: Lucilla Lame, MD;  Location: [place];  Service: Endoscopy;  Laterality: N/A;   FRACTURE SURGERY     ankle left   LEFT HEART CATH AND CORONARY ANGIOGRAPHY Left 08/14/2006   Procedure: LEFT HEART CATHETERIZATION AND CORONARY ANGIOGRAPHY; Location: [facility]: Surgeon: Katrine Coho, MD   VAGINAL HYSTERECTOMY     DUB     Social History   Socioeconomic History   Marital status: Married    Spouse name: Theodoro Grist   Number of children: 2   Years of education: Not on file   Highest education level: Some college, no degree  Occupational History    Employer: RETIRED  Tobacco Use   Smoking status: Never    Passive exposure: Never   Smokeless tobacco: Never  Vaping Use   Vaping Use: Never used  Substance and Sexual Activity   Alcohol use: No   Drug use: No   Sexual activity: Yes  Other Topics Concern   Not on file  Social History Narrative   Lives at home with husband.   Social Determinants of Health   Financial Resource Strain: Low Risk  (02/03/2021)   Overall Financial Resource Strain (CARDIA)    Difficulty of Paying Living Expenses: Not hard at all  Food Insecurity: No Food Insecurity (06/22/2022)   Hunger Vital Sign    Worried About Running Out of Food in the Last Year: Never true    Ran Out of Food in the Last Year: Never true  Transportation Needs: No Transportation Needs (06/22/2022)   PRAPARE - Hydrologist (Medical): No    Lack of Transportation (Non-Medical): No  Physical Activity: Sufficiently Active (02/09/2022)  Exercise Vital Sign    Days of Exercise per Week: 3 days    Minutes of Exercise per Session: 50 min  Stress: No Stress Concern Present (02/09/2022)   [place]    Feeling of Stress : Not at all  Social Connections: Moderately Integrated (02/09/2022)   Social Connection and Isolation Panel [NHANES]    Frequency of Communication with Friends and Family: More than three times a week    Frequency of Social Gatherings with Friends and Family: Three times a week    Attends Religious Services: Never    Active Member of Clubs or Organizations: Yes    Attends Archivist Meetings: More than 4 times per year    Marital Status: Married  Human resources officer Violence: Not At Risk  (02/03/2021)   Humiliation, Afraid, Rape, and Kick questionnaire    Fear of Current or Ex-Partner: No    Emotionally Abused: No    Physically Abused: No    Sexually Abused: No    Family History  Problem Relation Age of Onset   Heart attack Mother    Heart attack Maternal Uncle    Heart attack Maternal Uncle      Current Outpatient Medications:    ALPRAZolam (XANAX) 0.25 MG tablet, Take 1 tablet (0.25 mg total) by mouth daily as needed for anxiety., Disp: 20 tablet, Rfl: 0   atorvastatin (LIPITOR) 80 MG tablet, TAKE (1) TABLET BY MOUTH EVERY DAY, Disp: 90 tablet, Rfl: 1   Cyanocobalamin (VITAMIN B-12 IJ), Inject 1,000 vials as directed every 30 (thirty) days. Patient Not sure of dosage, Disp: , Rfl:    cyanocobalamin (VITAMIN B12) 1000 MCG tablet, Take 1 tablet (1,000 mcg total) by mouth daily., Disp: 90 tablet, Rfl: 0   ferrous sulfate 325 (65 FE) MG tablet, Take 1 tablet (325 mg total) by mouth daily with breakfast., Disp: 90 tablet, Rfl: 1   fluorouracil (EFUDEX) 5 % cream, Apply topically., Disp: , Rfl:    montelukast (SINGULAIR) 10 MG tablet, TAKE (1) TABLET BY MOUTH DAILY AT BEDTIME, Disp: 90 tablet, Rfl: 1   moxifloxacin (VIGAMOX) 0.5 % ophthalmic solution, Apply to eye., Disp: , Rfl:    Nutritional Supplements (ENSURE COMPLETE PO), Take 1 Can by mouth daily., Disp: , Rfl:    potassium chloride SA (KLOR-CON M) 20 MEQ tablet, TAKE (1) TABLET BY MOUTH EVERY DAY, Disp: 30 tablet, Rfl: 2  Physical exam:  Vitals:   07/01/22 1112  BP: 111/76  Pulse: 60  Resp: 18  Temp: 98.3 F (36.8 C)  TempSrc: Tympanic  SpO2: 100%  Weight: 105 lb 1.6 oz (47.7 kg)   Physical Exam Cardiovascular:     Rate and Rhythm: Normal rate and regular rhythm.     Heart sounds: Normal heart sounds.  Pulmonary:     Effort: Pulmonary effort is normal.     Breath sounds: Normal breath sounds.  Abdominal:     General: Bowel sounds are normal.     Palpations: Abdomen is soft.  Skin:    General: Skin  is warm and dry.  Neurological:     Mental Status: She is alert and oriented to person, place, and time.         Latest Ref Rng & Units 07/01/2022   11:00 AM  CMP  Glucose 70 - 99 mg/dL 94   BUN 8 - 23 mg/dL 16   Creatinine 0.44 - 1.00 mg/dL 0.85   Sodium 135 - 145  mmol/L 142   Potassium 3.5 - 5.1 mmol/L 4.4   Chloride 98 - 111 mmol/L 105   CO2 22 - 32 mmol/L 29   Calcium 8.9 - 10.3 mg/dL 9.4   Total Protein 6.5 - 8.1 g/dL 7.4   Total Bilirubin 0.3 - 1.2 mg/dL 1.8   Alkaline Phos 38 - 126 U/L 67   AST 15 - 41 U/L 49   ALT 0 - 44 U/L 70       Latest Ref Rng & Units 07/01/2022   11:00 AM  CBC  WBC 4.0 - 10.5 K/uL 6.3   Hemoglobin 12.0 - 15.0 g/dL 11.6   Hematocrit 36.0 - 46.0 % 37.7   Platelets 150 - 400 K/uL 234     No images are attached to the encounter.  Abdomen 1 view (KUB)  Result Date: 06/11/2022 CLINICAL DATA:  hx of kidney stones EXAM: ABDOMEN - 1 VIEW COMPARISON:  CT abdomen pelvis 02/10/2022, x-ray abdomen 10/19/2021, fluoro abdomen 11/12/2021 FINDINGS: Peripherally calcified 1.7 cm left upper quadrant splenic artery aneurysm again noted. Difficult to evaluate size in comparison CT given to different technique. The bowel gas pattern is normal. Punctate calcification overlies the right renal shadow the. Inferior renal pole of the right kidney not visualized due to overlying iliac bone and gaseous filled colon. The left renal shadow is not visualized due to overlying gaseous stomach. Phleboliths overlie the pelvis. Surgical and vascular clips overlie the pelvis. Severe degenerative changes of the right hip. At least mild to moderate degenerative changes of the left hip. IMPRESSION: Nonobstructive bowel gas pattern. Electronically Signed   By: Iven Finn M.D.   On: 06/11/2022 03:56     Assessment and plan- Patient is a 87 y.o. female with history of stage IIIc adenocarcinoma of the colon s/p 6 cycles of adjuvant Xeloda.  This is a routine surveillance visit for colon  cancer  Clinically patient is doing well with no concerning signs and symptoms of recurrence based on today's exam.  Given herAge and the desire for less aggressive surveillance we are doing scans once a year and she would be due for a repeat scan in November 2024.  I will see her back in 6 months with CBC with differential CMP and CEA.  She will continue monthly B12 injections at home   Visit Diagnosis 1. Encounter for follow-up surveillance of colon cancer      Dr. Randa Evens, MD, MPH [facility] [id number] 07/03/2022 10:08 AM

## 2022-07-05 DIAGNOSIS — C44612 Basal cell carcinoma of skin of right upper limb, including shoulder: Secondary | ICD-10-CM | POA: Diagnosis not present

## 2022-07-05 DIAGNOSIS — L988 Other specified disorders of the skin and subcutaneous tissue: Secondary | ICD-10-CM | POA: Diagnosis not present

## 2022-07-05 DIAGNOSIS — Z7189 Other specified counseling: Secondary | ICD-10-CM | POA: Diagnosis not present

## 2022-07-05 DIAGNOSIS — D0472 Carcinoma in situ of skin of left lower limb, including hip: Secondary | ICD-10-CM | POA: Diagnosis not present

## 2022-07-11 ENCOUNTER — Encounter: Payer: Self-pay | Admitting: Family Medicine

## 2022-07-13 ENCOUNTER — Other Ambulatory Visit: Payer: Self-pay

## 2022-07-13 ENCOUNTER — Ambulatory Visit: Payer: Medicare PPO

## 2022-07-13 ENCOUNTER — Ambulatory Visit (INDEPENDENT_AMBULATORY_CARE_PROVIDER_SITE_OTHER): Payer: Medicare PPO

## 2022-07-13 DIAGNOSIS — M25551 Pain in right hip: Secondary | ICD-10-CM

## 2022-07-13 DIAGNOSIS — E538 Deficiency of other specified B group vitamins: Secondary | ICD-10-CM

## 2022-07-13 MED ORDER — MELOXICAM 7.5 MG PO TABS: 7.5000 mg | ORAL_TABLET | Freq: Every day | ORAL | 0 refills | Status: DC

## 2022-07-13 MED ORDER — CYANOCOBALAMIN 1000 MCG/ML IJ SOLN
1000.0000 ug | Freq: Once | INTRAMUSCULAR | Status: AC
  Administered 2022-07-13: 1000 ug via INTRAMUSCULAR

## 2022-07-21 ENCOUNTER — Ambulatory Visit: Payer: Medicare PPO | Admitting: Family Medicine

## 2022-07-21 ENCOUNTER — Encounter: Payer: Self-pay | Admitting: Family Medicine

## 2022-07-21 VITALS — BP 120/70 | HR 72 | Ht 65.0 in | Wt 107.0 lb

## 2022-07-21 DIAGNOSIS — M1611 Unilateral primary osteoarthritis, right hip: Secondary | ICD-10-CM

## 2022-07-24 DIAGNOSIS — M1611 Unilateral primary osteoarthritis, right hip: Secondary | ICD-10-CM | POA: Insufficient documentation

## 2022-07-24 NOTE — Progress Notes (Signed)
     Primary Care / Sports Medicine Office Visit  Patient Information:  Patient ID: LATEEFA CROSBY, female DOB: 11-Mar-1936 Age: 87 y.o. MRN: [id number]   Phyllis Ross is a pleasant 87 y.o. female presenting with the following:  Chief Complaint  Patient presents with   Hip Pain    Vitals:   07/21/22 1437  BP: 120/70  Pulse: 72  SpO2: 97%   Vitals:   07/21/22 1437  Weight: 107 lb (48.5 kg)  Height:  (1.651 m)   Body mass index is 17.81 kg/m.  No results found.   Independent interpretation of notes and tests performed by another provider:   Independent interpretation of KUB x-ray 06/08/22 with significant R>L degenerative changes about the hip joints.  Procedures performed:   None  Pertinent History, Exam, Impression, and Recommendations:   Phyllis Ross was seen today for hip pain.  Primary osteoarthritis of right hip Assessment & Plan: Chronic with minimum symptoms localizing to the right groin at times. Currently doing well. Incidentally noted osteoarthritis by Urology when ordering KUB x-ray.  Examination does demonstrate +FADIR, otherwise negative modified SLR, and provocative testing in general. There is expected limited ROM consistent with degenerative changes on x-ray.  - Reassurance regarding activity as tolerated - PRN OTC medications - If symptoms fail to fully resolve or worsen, contact us for consideration of medication management / intra-articular cortisone      Orders & Medications No orders of the defined types were placed in this encounter.  No orders of the defined types were placed in this encounter.    No follow-ups on file.     Jerrol Banana, MD, [facility]   Primary Care Sports Medicine Primary Care and Sports Medicine at [facility]

## 2022-07-24 NOTE — Assessment & Plan Note (Signed)
Chronic with minimum symptoms localizing to the right groin at times. Currently doing well. Incidentally noted osteoarthritis by Urology when ordering KUB x-ray.  Examination does demonstrate +FADIR, otherwise negative modified SLR, and provocative testing in general. There is expected limited ROM consistent with degenerative changes on x-ray.  - Reassurance regarding activity as tolerated - PRN OTC medications - If symptoms fail to fully resolve or worsen, contact us for consideration of medication management / intra-articular cortisone

## 2022-08-04 DIAGNOSIS — H00021 Hordeolum internum right upper eyelid: Secondary | ICD-10-CM | POA: Diagnosis not present

## 2022-08-04 DIAGNOSIS — H0288A Meibomian gland dysfunction right eye, upper and lower eyelids: Secondary | ICD-10-CM | POA: Diagnosis not present

## 2022-08-04 DIAGNOSIS — H01021 Squamous blepharitis right upper eyelid: Secondary | ICD-10-CM | POA: Diagnosis not present

## 2022-08-10 ENCOUNTER — Other Ambulatory Visit: Payer: Self-pay | Admitting: Family Medicine

## 2022-08-10 DIAGNOSIS — H01021 Squamous blepharitis right upper eyelid: Secondary | ICD-10-CM | POA: Diagnosis not present

## 2022-08-10 DIAGNOSIS — H00021 Hordeolum internum right upper eyelid: Secondary | ICD-10-CM | POA: Diagnosis not present

## 2022-08-10 DIAGNOSIS — M25551 Pain in right hip: Secondary | ICD-10-CM

## 2022-08-10 DIAGNOSIS — H0288A Meibomian gland dysfunction right eye, upper and lower eyelids: Secondary | ICD-10-CM | POA: Diagnosis not present

## 2022-08-10 MED ORDER — MELOXICAM 7.5 MG PO TABS: 7.5000 mg | ORAL_TABLET | Freq: Every day | ORAL | 2 refills | Status: DC

## 2022-08-10 NOTE — Telephone Encounter (Signed)
Requested Prescriptions  Pending Prescriptions Disp Refills   meloxicam (MOBIC) 7.5 MG tablet 30 tablet 2    Sig: Take 1 tablet (7.5 mg total) by mouth daily.     Analgesics:  COX2 Inhibitors Failed - 08/10/2022 10:43 AM      Failed - Manual Review: Labs are only required if the patient has taken medication for more than 8 weeks.      Failed - HGB in normal range and within 360 days    Hemoglobin  Date Value Ref Range Status  07/01/2022 11.6 (L) 12.0 - 15.0 g/dL Final  16/01/9603 54.0 (L) 11.1 - 15.9 g/dL Final         Failed - AST in normal range and within 360 days    AST  Date Value Ref Range Status  07/01/2022 49 (H) 15 - 41 U/L Final   SGOT(AST)  Date Value Ref Range Status  06/22/2012 22 15 - 37 Unit/L Final         Failed - ALT in normal range and within 360 days    ALT  Date Value Ref Range Status  07/01/2022 70 (H) 0 - 44 U/L Final   SGPT (ALT)  Date Value Ref Range Status  06/22/2012 26 12 - 78 U/L Final         Passed - Cr in normal range and within 360 days    Creatinine  Date Value Ref Range Status  06/22/2012 1.19 0.60 - 1.30 mg/dL Final   Creatinine, Ser  Date Value Ref Range Status  07/01/2022 0.85 0.44 - 1.00 mg/dL Final         Passed - HCT in normal range and within 360 days    HCT  Date Value Ref Range Status  07/01/2022 37.7 36.0 - 46.0 % Final   Hematocrit  Date Value Ref Range Status  04/07/2020 37.3 34.0 - 46.6 % Final         Passed - eGFR is 30 or above and within 360 days    EGFR (African American)  Date Value Ref Range Status  06/22/2012 52 (L)  Final   GFR calc Af Amer  Date Value Ref Range Status  04/07/2020 60 >59 mL/min/1.73 Final    Comment:    **In accordance with recommendations from the NKF-ASN Task force,**   [facility] is in the process of updating its eGFR calculation to the   2021 CKD-EPI creatinine equation that estimates kidney function   without a race variable.    EGFR (Non-African Amer.)  Date Value Ref  Range Status  06/22/2012 45 (L)  Final    Comment:    eGFR values <7mL/min/1.73 m2 may be an indication of chronic kidney disease (CKD). Calculated eGFR is useful in patients with stable renal function. The eGFR calculation will not be reliable in acutely ill patients when serum creatinine is changing rapidly. It is not useful in  patients on dialysis. The eGFR calculation may not be applicable to patients at the low and high extremes of body sizes, pregnant women, and vegetarians.    GFR, Estimated  Date Value Ref Range Status  07/01/2022 >60 >60 mL/min Final    Comment:    (NOTE) Calculated using the CKD-EPI Creatinine Equation (2021)          Passed - Patient is not pregnant      Passed - Valid encounter within last 12 months    Recent Outpatient Visits  2 weeks ago Primary osteoarthritis of right hip   [place] [facility], Ocie Bob, MD   4 months ago Familial multiple lipoprotein-type hyperlipidemia   [place] [facility] Phineas Inches, MD   6 months ago Painful urination   [place] [facility] Inches, MD   9 months ago Burning with urination    [facility] & [facility] Medicine at [facility] Inches, MD   10 months ago Acute cystitis with hematuria   [facility] at [facility] Inches, MD       Future Appointments             In 2 months Duanne Limerick, MD [facility] Primary [facility] & [facility] Medicine at [facility]   In 10 months Richardo Hanks, Laurette Schimke, MD [facility] [place]

## 2022-08-10 NOTE — Telephone Encounter (Signed)
Medication Refill - Medication: meloxicam (MOBIC) 7.5 MG tablet   Has the patient contacted their pharmacy? No   Preferred Pharmacy (with phone number or street name):   [facility] - [place], [place] - [address] Phone: [phone]  Fax: [phone]    Has the patient been seen for an appointment in the last year OR does the patient have an upcoming appointment? Yes.    Please assist patient further

## 2022-08-15 ENCOUNTER — Ambulatory Visit (INDEPENDENT_AMBULATORY_CARE_PROVIDER_SITE_OTHER): Payer: Medicare PPO

## 2022-08-15 DIAGNOSIS — E538 Deficiency of other specified B group vitamins: Secondary | ICD-10-CM

## 2022-08-15 MED ORDER — CYANOCOBALAMIN 1000 MCG/ML IJ SOLN
1000.0000 ug | Freq: Once | INTRAMUSCULAR | Status: AC
  Administered 2022-08-15: 1000 ug via INTRAMUSCULAR

## 2022-08-22 ENCOUNTER — Encounter: Payer: Self-pay | Admitting: Family Medicine

## 2022-08-22 ENCOUNTER — Ambulatory Visit (INDEPENDENT_AMBULATORY_CARE_PROVIDER_SITE_OTHER): Payer: Medicare PPO | Admitting: Family Medicine

## 2022-08-22 VITALS — BP 120/76 | HR 96 | Ht 65.0 in | Wt 106.0 lb

## 2022-08-22 DIAGNOSIS — L03115 Cellulitis of right lower limb: Secondary | ICD-10-CM | POA: Diagnosis not present

## 2022-08-22 MED ORDER — CEPHALEXIN 500 MG PO CAPS: 500.0000 mg | ORAL_CAPSULE | Freq: Two times a day (BID) | ORAL | 0 refills | Status: DC

## 2022-08-22 MED ORDER — MUPIROCIN 2 % EX OINT: 1.0000 | TOPICAL_OINTMENT | Freq: Two times a day (BID) | CUTANEOUS | 0 refills | Status: DC

## 2022-08-22 NOTE — Progress Notes (Signed)
Date:  08/22/2022   Name:  Phyllis Ross   DOB:  1935/05/02   MRN:  [id number]   Chief Complaint: place on leg (Cleaning out garage- might have hit leg on something. )  Rash This is a new problem. The current episode started yesterday. The problem is unchanged. The affected locations include the right ankle. The rash is characterized by redness (linear). Associated with: local contact. Pertinent negatives include no fever or shortness of breath.    Lab Results  Component Value Date   NA 142 07/01/2022   K 4.4 07/01/2022   CO2 29 07/01/2022   GLUCOSE 94 07/01/2022   BUN 16 07/01/2022   CREATININE 0.85 07/01/2022   CALCIUM 9.4 07/01/2022   GFRNONAA >60 07/01/2022   Lab Results  Component Value Date   CHOL 126 04/01/2022   HDL 63 04/01/2022   LDLCALC 48 04/01/2022   TRIG 75 04/01/2022   CHOLHDL 2.7 10/07/2020   No results found for: "TSH" Lab Results  Component Value Date   HGBA1C 5.5 04/07/2020   Lab Results  Component Value Date   WBC 6.3 07/01/2022   HGB 11.6 (L) 07/01/2022   HCT 37.7 07/01/2022   MCV 70.1 (L) 07/01/2022   PLT 234 07/01/2022   Lab Results  Component Value Date   ALT 70 (H) 07/01/2022   AST 49 (H) 07/01/2022   ALKPHOS 67 07/01/2022   BILITOT 1.8 (H) 07/01/2022   No results found for: "25OHVITD2", "25OHVITD3", "VD25OH"   Review of Systems  Constitutional:  Negative for chills and fever.  Respiratory:  Negative for chest tightness, shortness of breath and wheezing.   Cardiovascular:  Negative for chest pain and palpitations.  Gastrointestinal:  Negative for abdominal pain.  Skin:  Positive for rash.    Patient Active Problem List   Diagnosis Date Noted   Primary osteoarthritis of right hip 07/24/2022   History of colon cancer    Polyp of transverse colon    B12 deficiency 09/16/2020   Goals of care, counseling/discussion 08/07/2020   Microcytic anemia 07/23/2020   Status post partial colectomy 07/02/2020   Protein-calorie  malnutrition, severe 06/24/2020   Cecal cancer (HCC) 06/15/2020   Iron deficiency anemia due to chronic blood loss    Neoplasm of lower gastrointestinal tract    Acute gastritis with hemorrhage    Personal history of other malignant neoplasm of skin 01/21/2019   Atherosclerosis of abdominal aorta (HCC) 12/26/2017   Age-related osteoporosis without current pathological fracture 06/16/2016   Encounter for follow-up surveillance of ovarian cancer 05/20/2015   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   Wedging of vertebra (HCC) 10/13/2014   Polypharmacy 10/13/2014   Encounter for general adult medical examination without abnormal findings 10/13/2014   Anxiety 10/13/2014   Essential hypertension 08/27/2014   Bradycardia 08/27/2014   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) 08/27/2014   MI (mitral incompetence) 08/27/2014   TI (tricuspid incompetence) 08/27/2014   Combined fat and carbohydrate induced hyperlipemia 08/13/2014   HTN (hypertension), malignant 08/11/2014   Chest pain 08/11/2014   CAD (coronary artery disease) of artery bypass graft 08/11/2014    Allergies  Allergen Reactions   Trazodone And Nefazodone Other (See Comments)    Made her pass out    Past Surgical History:  Procedure Laterality Date   COLONOSCOPY  04/04/2009   normal- Dr Mechele Collin   COLONOSCOPY WITH PROPOFOL N/A 06/04/2020   Procedure: COLONOSCOPY WITH PROPOFOL;  Surgeon: Midge Minium, MD;  Location: [facility];  Service: Endoscopy;  Laterality: N/A;   COLONOSCOPY WITH PROPOFOL N/A 01/31/2022   Procedure: COLONOSCOPY WITH PROPOFOL;  Surgeon: Midge Minium, MD;  Location: [facility];  Service: Endoscopy;  Laterality: N/A;   CORONARY ARTERY BYPASS GRAFT N/A 08/14/2006   Procedure: CORONARY ARTERY BYPASS GRAFT; Location: [facility]; Surgeon: Tilford Pillar, MD   CYSTOSCOPY WITH RETROGRADE PYELOGRAM, URETEROSCOPY AND STENT PLACEMENT Right 11/12/2021    Procedure: CYSTOSCOPY WITH RETROGRADE PYELOGRAM / RIGHT URETEROSCOPY/HOLMIUM LASER/RIGHT STENT EXCHANGE;  Surgeon: Sondra Come, MD;  Location: [facility];  Service: Urology;  Laterality: Right;   CYSTOSCOPY/URETEROSCOPY/HOLMIUM LASER/STENT PLACEMENT Right 10/29/2021   Procedure: CYSTOSCOPY/ RIGHT STENT PLACEMENT;  Surgeon: Sondra Come, MD;  Location: [facility];  Service: Urology;  Laterality: Right;   ESOPHAGOGASTRODUODENOSCOPY (EGD) WITH PROPOFOL N/A 06/04/2020   Procedure: ESOPHAGOGASTRODUODENOSCOPY (EGD) WITH PROPOFOL;  Surgeon: Midge Minium, MD;  Location: [facility];  Service: Endoscopy;  Laterality: N/A;   FRACTURE SURGERY     ankle left   LEFT HEART CATH AND CORONARY ANGIOGRAPHY Left 08/14/2006   Procedure: LEFT HEART CATHETERIZATION AND CORONARY ANGIOGRAPHY; Location: [facility]: Surgeon: Rudean Hitt, MD   VAGINAL HYSTERECTOMY     DUB    Social History   Tobacco Use   Smoking status: Never    Passive exposure: Never   Smokeless tobacco: Never  Vaping Use   Vaping Use: Never used  Substance Use Topics   Alcohol use: No   Drug use: No     Medication list has been reviewed and updated.  Current Meds  Medication Sig   ALPRAZolam (XANAX) 0.25 MG tablet Take 1 tablet (0.25 mg total) by mouth daily as needed for anxiety.   atorvastatin (LIPITOR) 80 MG tablet TAKE (1) TABLET BY MOUTH EVERY DAY   Cyanocobalamin (VITAMIN B-12 IJ) Inject 1,000 vials as directed every 30 (thirty) days. Patient Not sure of dosage   cyanocobalamin (VITAMIN B12) 1000 MCG tablet Take 1 tablet (1,000 mcg total) by mouth daily.   ferrous sulfate 325 (65 FE) MG tablet Take 1 tablet (325 mg total) by mouth daily with breakfast.   fluorouracil (EFUDEX) 5 % cream Apply topically.   meloxicam (MOBIC) 7.5 MG tablet Take 1 tablet (7.5 mg total) by mouth daily.   montelukast (SINGULAIR) 10 MG tablet TAKE (1) TABLET BY MOUTH DAILY AT BEDTIME   moxifloxacin (VIGAMOX) 0.5 % ophthalmic solution Apply  to eye.   Nutritional Supplements (ENSURE COMPLETE PO) Take 1 Can by mouth daily.   potassium chloride SA (KLOR-CON M) 20 MEQ tablet TAKE (1) TABLET BY MOUTH EVERY DAY       08/22/2022    4:24 PM 04/01/2022    2:35 PM 01/17/2022    3:14 PM 04/08/2021    2:06 PM  GAD 7 : Generalized Anxiety Score  Nervous, Anxious, on Edge 0 2 0 0  Control/stop worrying 0 2 0 0  Worry too much - different things 0 2 0 0  Trouble relaxing 0 0 0 0  Restless 0 0 0 0  Easily annoyed or irritable 0 1 0 0  Afraid - awful might happen 0 1 0 0  Total GAD 7 Score 0 8 0 0  Anxiety Difficulty Not difficult at all Somewhat difficult Not difficult at all Not difficult at all       08/22/2022    4:24 PM 04/01/2022    2:32 PM 02/09/2022    3:07 PM  Depression screen PHQ 2/9  Decreased Interest 0  1 0  Down, Depressed, Hopeless 0 2 0  PHQ - 2 Score 0 3 0  Altered sleeping 0 0 0  Tired, decreased energy 0 0 0  Change in appetite 0 0 0  Feeling bad or failure about yourself  0 0 0  Trouble concentrating 0 0 0  Moving slowly or fidgety/restless 0 0 0  Suicidal thoughts 0 0 0  PHQ-9 Score 0 3 0  Difficult doing work/chores Not difficult at all Not difficult at all Not difficult at all    BP Readings from Last 3 Encounters:  08/22/22 120/76  07/21/22 120/70  07/01/22 111/76    Physical Exam Vitals and nursing note reviewed. Exam conducted with a chaperone present.  Constitutional:      General: She is not in acute distress.    Appearance: She is not diaphoretic.  HENT:     Head: Normocephalic and atraumatic.     Right Ear: Tympanic membrane and external ear normal.     Left Ear: Tympanic membrane and external ear normal.     Nose: Nose normal.     Mouth/Throat:     Mouth: Mucous membranes are moist.  Eyes:     General:        Right eye: No discharge.        Left eye: No discharge.     Conjunctiva/sclera: Conjunctivae normal.     Pupils: Pupils are equal, round, and reactive to light.  Neck:      Thyroid: No thyromegaly.     Vascular: No JVD.  Cardiovascular:     Rate and Rhythm: Normal rate and regular rhythm.     Heart sounds: Normal heart sounds. No murmur heard.    No friction rub. No gallop.  Pulmonary:     Effort: Pulmonary effort is normal.     Breath sounds: Normal breath sounds. No wheezing, rhonchi or rales.  Abdominal:     General: Bowel sounds are normal.     Palpations: Abdomen is soft. There is no mass.     Tenderness: There is no abdominal tenderness. There is no guarding.  Musculoskeletal:        General: Normal range of motion.     Cervical back: Normal range of motion and neck supple.  Lymphadenopathy:     Cervical: No cervical adenopathy.  Skin:    General: Skin is warm and dry.     Findings: Abrasion, bruising and erythema present.     Comments: Scratch linear 4 abrasive areas  Neurological:     Mental Status: She is alert.     Deep Tendon Reflexes: Reflexes are normal and symmetric.     Wt Readings from Last 3 Encounters:  08/22/22 106 lb (48.1 kg)  07/21/22 107 lb (48.5 kg)  07/01/22 105 lb 1.6 oz (47.7 kg)    BP 120/76   Pulse 96   Ht 5\' 5"  (1.651 m)   Wt 106 lb (48.1 kg)   SpO2 94%   BMI 17.64 kg/m   Assessment and Plan: 1. Cellulitis of right lower extremity New onset.  Probably secondary to an abrasion while moving antiques in her garage.  Linear in nature to suggest something was applied to it to cause a abrasion.  We will treat with cephalexin after patient cleans area with soap and water at least once a day and covered with Band-Aid with Bactroban ointment.  We will treat also with cephalexin 500 mg twice a day for 5 days and will recheck  on as-needed basis. - cephALEXin (KEFLEX) 500 MG capsule; Take 1 capsule (500 mg total) by mouth 2 (two) times daily.  Dispense: 10 capsule; Refill: 0 - mupirocin ointment (BACTROBAN) 2 %; Apply 1 Application topically 2 (two) times daily.  Dispense: 22 g; Refill: 0     Elizabeth Sauer, MD

## 2022-09-01 DIAGNOSIS — Z7189 Other specified counseling: Secondary | ICD-10-CM | POA: Diagnosis not present

## 2022-09-01 DIAGNOSIS — L820 Inflamed seborrheic keratosis: Secondary | ICD-10-CM | POA: Diagnosis not present

## 2022-09-01 DIAGNOSIS — Z08 Encounter for follow-up examination after completed treatment for malignant neoplasm: Secondary | ICD-10-CM | POA: Diagnosis not present

## 2022-09-01 DIAGNOSIS — L538 Other specified erythematous conditions: Secondary | ICD-10-CM | POA: Diagnosis not present

## 2022-09-01 DIAGNOSIS — D485 Neoplasm of uncertain behavior of skin: Secondary | ICD-10-CM | POA: Diagnosis not present

## 2022-09-01 DIAGNOSIS — C44619 Basal cell carcinoma of skin of left upper limb, including shoulder: Secondary | ICD-10-CM | POA: Diagnosis not present

## 2022-09-01 DIAGNOSIS — L298 Other pruritus: Secondary | ICD-10-CM | POA: Diagnosis not present

## 2022-09-01 DIAGNOSIS — L821 Other seborrheic keratosis: Secondary | ICD-10-CM | POA: Diagnosis not present

## 2022-09-01 DIAGNOSIS — C44719 Basal cell carcinoma of skin of left lower limb, including hip: Secondary | ICD-10-CM | POA: Diagnosis not present

## 2022-09-01 DIAGNOSIS — Z872 Personal history of diseases of the skin and subcutaneous tissue: Secondary | ICD-10-CM | POA: Diagnosis not present

## 2022-09-01 DIAGNOSIS — Z85828 Personal history of other malignant neoplasm of skin: Secondary | ICD-10-CM | POA: Diagnosis not present

## 2022-09-19 ENCOUNTER — Ambulatory Visit (INDEPENDENT_AMBULATORY_CARE_PROVIDER_SITE_OTHER): Payer: Medicare PPO

## 2022-09-19 DIAGNOSIS — E538 Deficiency of other specified B group vitamins: Secondary | ICD-10-CM

## 2022-09-20 DIAGNOSIS — E538 Deficiency of other specified B group vitamins: Secondary | ICD-10-CM

## 2022-09-20 MED ORDER — CYANOCOBALAMIN 1000 MCG/ML IJ SOLN
1000.0000 ug | Freq: Once | INTRAMUSCULAR | Status: AC
Start: 2022-09-20 — End: 2022-09-20
  Administered 2022-09-20: 1000 ug via INTRAMUSCULAR

## 2022-09-21 ENCOUNTER — Other Ambulatory Visit: Payer: Self-pay | Admitting: Family Medicine

## 2022-09-21 DIAGNOSIS — E7849 Other hyperlipidemia: Secondary | ICD-10-CM

## 2022-09-28 DIAGNOSIS — I340 Nonrheumatic mitral (valve) insufficiency: Secondary | ICD-10-CM | POA: Diagnosis not present

## 2022-09-28 DIAGNOSIS — R9431 Abnormal electrocardiogram [ECG] [EKG]: Secondary | ICD-10-CM | POA: Diagnosis not present

## 2022-09-28 DIAGNOSIS — I071 Rheumatic tricuspid insufficiency: Secondary | ICD-10-CM | POA: Diagnosis not present

## 2022-09-28 DIAGNOSIS — I10 Essential (primary) hypertension: Secondary | ICD-10-CM | POA: Diagnosis not present

## 2022-09-28 DIAGNOSIS — I25810 Atherosclerosis of coronary artery bypass graft(s) without angina pectoris: Secondary | ICD-10-CM | POA: Diagnosis not present

## 2022-09-28 DIAGNOSIS — I700 Atherosclerosis of aorta: Secondary | ICD-10-CM | POA: Diagnosis not present

## 2022-10-03 ENCOUNTER — Ambulatory Visit: Payer: Medicare PPO | Admitting: Family Medicine

## 2022-10-10 ENCOUNTER — Encounter: Payer: Self-pay | Admitting: Family Medicine

## 2022-10-10 ENCOUNTER — Ambulatory Visit: Payer: Medicare PPO | Admitting: Family Medicine

## 2022-10-10 VITALS — BP 110/70 | HR 72 | Ht 65.0 in | Wt 104.0 lb

## 2022-10-10 DIAGNOSIS — I25810 Atherosclerosis of coronary artery bypass graft(s) without angina pectoris: Secondary | ICD-10-CM | POA: Diagnosis not present

## 2022-10-10 DIAGNOSIS — D509 Iron deficiency anemia, unspecified: Secondary | ICD-10-CM | POA: Diagnosis not present

## 2022-10-10 DIAGNOSIS — E876 Hypokalemia: Secondary | ICD-10-CM | POA: Diagnosis not present

## 2022-10-10 DIAGNOSIS — E7849 Other hyperlipidemia: Secondary | ICD-10-CM | POA: Diagnosis not present

## 2022-10-10 DIAGNOSIS — I340 Nonrheumatic mitral (valve) insufficiency: Secondary | ICD-10-CM | POA: Diagnosis not present

## 2022-10-10 DIAGNOSIS — R9431 Abnormal electrocardiogram [ECG] [EKG]: Secondary | ICD-10-CM | POA: Diagnosis not present

## 2022-10-10 DIAGNOSIS — E538 Deficiency of other specified B group vitamins: Secondary | ICD-10-CM | POA: Diagnosis not present

## 2022-10-10 DIAGNOSIS — R7401 Elevation of levels of liver transaminase levels: Secondary | ICD-10-CM

## 2022-10-10 DIAGNOSIS — I071 Rheumatic tricuspid insufficiency: Secondary | ICD-10-CM | POA: Diagnosis not present

## 2022-10-10 DIAGNOSIS — M25552 Pain in left hip: Secondary | ICD-10-CM

## 2022-10-10 DIAGNOSIS — J45901 Unspecified asthma with (acute) exacerbation: Secondary | ICD-10-CM | POA: Diagnosis not present

## 2022-10-10 MED ORDER — FERROUS SULFATE 325 (65 FE) MG PO TABS: 325.0000 mg | ORAL_TABLET | Freq: Every day | ORAL | 1 refills | Status: DC

## 2022-10-10 MED ORDER — VITAMIN B-12 1000 MCG PO TABS
1000.0000 ug | ORAL_TABLET | Freq: Every day | ORAL | 1 refills | Status: DC
Start: 2022-10-10 — End: 2023-02-26

## 2022-10-10 MED ORDER — ATORVASTATIN CALCIUM 80 MG PO TABS: ORAL_TABLET | ORAL | 1 refills | Status: DC

## 2022-10-10 MED ORDER — MONTELUKAST SODIUM 10 MG PO TABS: ORAL_TABLET | ORAL | 1 refills | Status: DC

## 2022-10-10 MED ORDER — POTASSIUM CHLORIDE CRYS ER 20 MEQ PO TBCR
EXTENDED_RELEASE_TABLET | ORAL | 1 refills | Status: AC
Start: 2022-10-10 — End: ?

## 2022-10-10 NOTE — Progress Notes (Signed)
Date:  10/10/2022   Name:  Phyllis Ross   DOB:  Jan 03, 1936   MRN:  [id number]   Chief Complaint: hypokalemia, vitamin b 12, Hyperlipidemia, Anemia, Allergic Rhinitis , and Arthritis  Hyperlipidemia This is a chronic problem. The current episode started more than 1 year ago. The problem is controlled. Recent lipid tests were reviewed and are normal. There are no known factors aggravating her hyperlipidemia. Pertinent negatives include no chest pain, myalgias or shortness of breath. Current antihyperlipidemic treatment includes statins. The current treatment provides moderate improvement of lipids. There are no compliance problems.  Risk factors for coronary artery disease include dyslipidemia and hypertension.  Anemia Presents for follow-up visit. There has been no abdominal pain, bruising/bleeding easily, fever, palpitations or weight loss. Signs of blood loss that are not present include hematemesis, hematochezia, melena and vaginal bleeding. There are no compliance problems.   Arthritis Presents for follow-up visit. She reports no pain, stiffness or joint swelling. The symptoms have been improving. Affected locations include the right PIP, right DIP, right MCP, left hip and right hip. Pertinent negatives include no dysuria, fatigue, fever, pain at night, rash or weight loss.    Lab Results  Component Value Date   NA 142 07/01/2022   K 4.4 07/01/2022   CO2 29 07/01/2022   GLUCOSE 94 07/01/2022   BUN 16 07/01/2022   CREATININE 0.85 07/01/2022   CALCIUM 9.4 07/01/2022   GFRNONAA >60 07/01/2022   Lab Results  Component Value Date   CHOL 126 04/01/2022   HDL 63 04/01/2022   LDLCALC 48 04/01/2022   TRIG 75 04/01/2022   CHOLHDL 2.7 10/07/2020   No results found for: "TSH" Lab Results  Component Value Date   HGBA1C 5.5 04/07/2020   Lab Results  Component Value Date   WBC 6.3 07/01/2022   HGB 11.6 (L) 07/01/2022   HCT 37.7 07/01/2022   MCV 70.1 (L) 07/01/2022   PLT 234  07/01/2022   Lab Results  Component Value Date   ALT 70 (H) 07/01/2022   AST 49 (H) 07/01/2022   ALKPHOS 67 07/01/2022   BILITOT 1.8 (H) 07/01/2022   No results found for: "25OHVITD2", "25OHVITD3", "VD25OH"   Review of Systems  Constitutional: Negative.  Negative for chills, fatigue, fever, unexpected weight change and weight loss.  HENT:  Negative for congestion, ear discharge, ear pain, rhinorrhea, sinus pressure and sneezing.   Respiratory:  Negative for cough, shortness of breath, wheezing and stridor.   Cardiovascular:  Negative for chest pain, palpitations and leg swelling.  Gastrointestinal:  Negative for abdominal pain, blood in stool, constipation, hematemesis, hematochezia and melena.  Genitourinary:  Negative for dysuria, flank pain, frequency, hematuria, urgency, vaginal bleeding and vaginal discharge.  Musculoskeletal:  Positive for arthritis. Negative for arthralgias, back pain, joint swelling, myalgias and stiffness.  Skin:  Negative for rash.  Neurological:  Negative for dizziness, weakness and headaches.  Hematological:  Negative for adenopathy. Does not bruise/bleed easily.  Psychiatric/Behavioral:  Negative for dysphoric mood. The patient is not nervous/anxious.     Patient Active Problem List   Diagnosis Date Noted   Primary osteoarthritis of right hip 07/24/2022   History of colon cancer    Polyp of transverse colon    B12 deficiency 09/16/2020   Goals of care, counseling/discussion 08/07/2020   Microcytic anemia 07/23/2020   Status post partial colectomy 07/02/2020   Protein-calorie malnutrition, severe 06/24/2020   Cecal cancer (HCC) 06/15/2020   Iron deficiency anemia due to chronic  blood loss    Neoplasm of lower gastrointestinal tract    Acute gastritis with hemorrhage    Personal history of other malignant neoplasm of skin 01/21/2019   Atherosclerosis of abdominal aorta (HCC) 12/26/2017   Age-related osteoporosis without current pathological  fracture 06/16/2016   Encounter for follow-up surveillance of ovarian cancer 05/20/2015   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   Wedging of vertebra (HCC) 10/13/2014   Polypharmacy 10/13/2014   Encounter for general adult medical examination without abnormal findings 10/13/2014   Anxiety 10/13/2014   Essential hypertension 08/27/2014   Bradycardia 08/27/2014   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) 08/27/2014   MI (mitral incompetence) 08/27/2014   TI (tricuspid incompetence) 08/27/2014   Combined fat and carbohydrate induced hyperlipemia 08/13/2014   HTN (hypertension), malignant 08/11/2014   Chest pain 08/11/2014   CAD (coronary artery disease) of artery bypass graft 08/11/2014    Allergies  Allergen Reactions   Trazodone And Nefazodone Other (See Comments)    Made her pass out    Past Surgical History:  Procedure Laterality Date   COLONOSCOPY  04/04/2009   normal- Dr Mechele Collin   COLONOSCOPY WITH PROPOFOL N/A 06/04/2020   Procedure: COLONOSCOPY WITH PROPOFOL;  Surgeon: Midge Minium, MD;  Location: [facility];  Service: Endoscopy;  Laterality: N/A;   COLONOSCOPY WITH PROPOFOL N/A 01/31/2022   Procedure: COLONOSCOPY WITH PROPOFOL;  Surgeon: Midge Minium, MD;  Location: [facility];  Service: Endoscopy;  Laterality: N/A;   CORONARY ARTERY BYPASS GRAFT N/A 08/14/2006   Procedure: CORONARY ARTERY BYPASS GRAFT; Location: [facility]; Surgeon: Tilford Pillar, MD   CYSTOSCOPY WITH RETROGRADE PYELOGRAM, URETEROSCOPY AND STENT PLACEMENT Right 11/12/2021   Procedure: CYSTOSCOPY WITH RETROGRADE PYELOGRAM / RIGHT URETEROSCOPY/HOLMIUM LASER/RIGHT STENT EXCHANGE;  Surgeon: Sondra Come, MD;  Location: [facility];  Service: Urology;  Laterality: Right;   CYSTOSCOPY/URETEROSCOPY/HOLMIUM LASER/STENT PLACEMENT Right 10/29/2021   Procedure: CYSTOSCOPY/ RIGHT STENT PLACEMENT;  Surgeon: Sondra Come, MD;  Location: [facility];   Service: Urology;  Laterality: Right;   ESOPHAGOGASTRODUODENOSCOPY (EGD) WITH PROPOFOL N/A 06/04/2020   Procedure: ESOPHAGOGASTRODUODENOSCOPY (EGD) WITH PROPOFOL;  Surgeon: Midge Minium, MD;  Location: [facility];  Service: Endoscopy;  Laterality: N/A;   FRACTURE SURGERY     ankle left   LEFT HEART CATH AND CORONARY ANGIOGRAPHY Left 08/14/2006   Procedure: LEFT HEART CATHETERIZATION AND CORONARY ANGIOGRAPHY; Location: [facility]: Surgeon: Rudean Hitt, MD   VAGINAL HYSTERECTOMY     DUB    Social History   Tobacco Use   Smoking status: Never    Passive exposure: Never   Smokeless tobacco: Never  Vaping Use   Vaping Use: Never used  Substance Use Topics   Alcohol use: No   Drug use: No     Medication list has been reviewed and updated.  Current Meds  Medication Sig   ALPRAZolam (XANAX) 0.25 MG tablet Take 1 tablet (0.25 mg total) by mouth daily as needed for anxiety.   atorvastatin (LIPITOR) 80 MG tablet TAKE (1) TABLET BY MOUTH EVERY DAY   Cyanocobalamin (VITAMIN B-12 IJ) Inject 1,000 vials as directed every 30 (thirty) days. Patient Not sure of dosage   cyanocobalamin (VITAMIN B12) 1000 MCG tablet Take 1 tablet (1,000 mcg total) by mouth daily.   ferrous sulfate 325 (65 FE) MG tablet Take 1 tablet (325 mg total) by mouth daily with breakfast.   fluorouracil (EFUDEX) 5 % cream Apply topically.   meloxicam (MOBIC) 7.5 MG tablet Take 1 tablet (  7.5 mg total) by mouth daily.   montelukast (SINGULAIR) 10 MG tablet TAKE (1) TABLET BY MOUTH DAILY AT BEDTIME   mupirocin ointment (BACTROBAN) 2 % Apply 1 Application topically 2 (two) times daily.   Nutritional Supplements (ENSURE COMPLETE PO) Take 1 Can by mouth daily.   potassium chloride SA (KLOR-CON M) 20 MEQ tablet TAKE (1) TABLET BY MOUTH EVERY DAY   [DISCONTINUED] cephALEXin (KEFLEX) 500 MG capsule Take 1 capsule (500 mg total) by mouth 2 (two) times daily.       10/10/2022    9:21 AM 10/10/2022    9:19 AM 08/22/2022     4:24 PM 04/01/2022    2:35 PM  GAD 7 : Generalized Anxiety Score  Nervous, Anxious, on Edge 0 0 0 2  Control/stop worrying 0 0 0 2  Worry too much - different things 0 0 0 2  Trouble relaxing 0 0 0 0  Restless 0 0 0 0  Easily annoyed or irritable 0 0 0 1  Afraid - awful might happen 0 0 0 1  Total GAD 7 Score 0 0 0 8  Anxiety Difficulty Not difficult at all Not difficult at all Not difficult at all Somewhat difficult       10/10/2022    9:21 AM 10/10/2022    9:19 AM 08/22/2022    4:24 PM  Depression screen PHQ 2/9  Decreased Interest 0 0 0  Down, Depressed, Hopeless 0 0 0  PHQ - 2 Score 0 0 0  Altered sleeping 0 0 0  Tired, decreased energy 0 0 0  Change in appetite 0 0 0  Feeling bad or failure about yourself  0 0 0  Trouble concentrating 0 0 0  Moving slowly or fidgety/restless 0 0 0  Suicidal thoughts 0 0 0  PHQ-9 Score 0 0 0  Difficult doing work/chores Not difficult at all Not difficult at all Not difficult at all    BP Readings from Last 3 Encounters:  10/10/22 110/70  08/22/22 120/76  07/21/22 120/70    Physical Exam Vitals and nursing note reviewed. Exam conducted with a chaperone present.  Constitutional:      General: She is not in acute distress.    Appearance: She is not diaphoretic.  HENT:     Head: Normocephalic and atraumatic.     Right Ear: Tympanic membrane, ear canal and external ear normal.     Left Ear: Tympanic membrane, ear canal and external ear normal.     Nose: Nose normal.     Mouth/Throat:     Mouth: Mucous membranes are moist.  Eyes:     General:        Right eye: No discharge.        Left eye: No discharge.     Conjunctiva/sclera: Conjunctivae normal.     Pupils: Pupils are equal, round, and reactive to light.  Neck:     Thyroid: No thyromegaly.     Vascular: No JVD.  Cardiovascular:     Rate and Rhythm: Normal rate and regular rhythm.     Heart sounds: Normal heart sounds. No murmur heard.    No friction rub. No gallop.   Pulmonary:     Effort: Pulmonary effort is normal.     Breath sounds: Normal breath sounds. No wheezing, rhonchi or rales.  Chest:     Chest wall: No tenderness.  Abdominal:     General: Bowel sounds are normal.     Palpations: Abdomen is  soft. There is no mass.     Tenderness: There is no abdominal tenderness. There is no guarding.  Musculoskeletal:        General: Normal range of motion.     Cervical back: Normal range of motion and neck supple.  Lymphadenopathy:     Cervical: No cervical adenopathy.  Skin:    General: Skin is warm and dry.     Findings: No bruising or erythema.  Neurological:     Mental Status: She is alert.     Deep Tendon Reflexes:     Reflex Scores:      Patellar reflexes are 1+ on the right side and 1+ on the left side.    Wt Readings from Last 3 Encounters:  10/10/22 104 lb (47.2 kg)  08/22/22 106 lb (48.1 kg)  07/21/22 107 lb (48.5 kg)    BP 110/70   Pulse 72   Ht 5\' 5"  (1.651 m)   Wt 104 lb (47.2 kg)   SpO2 96%   BMI 17.31 kg/m   Assessment and Plan: 1. Familial multiple lipoprotein-type hyperlipidemia Chronic.  Controlled.  Stable.  Continue atorvastatin 80 mg once a day.  Will check lipid panel for current level of control.  Dietary discretion reemphasized.  Will recheck in 6 months. - atorvastatin (LIPITOR) 80 MG tablet; TAKE (1) TABLET BY MOUTH EVERY DAY  Dispense: 90 tablet; Refill: 1 - Lipid Panel With LDL/HDL Ratio  2. Microcytic anemia Chronic.  Controlled.  Stable.  Patient will continue with iron supplementation 325 mg daily.  Will recheck in 6 months. - ferrous sulfate 325 (65 FE) MG tablet; Take 1 tablet (325 mg total) by mouth daily with breakfast.  Dispense: 90 tablet; Refill: 1  3. Bronchitis, allergic, unspecified asthma severity, with acute exacerbation Chronic.  Controlled.  Stable.  Will continue with Singulair 10 mg daily. - montelukast (SINGULAIR) 10 MG tablet; TAKE (1) TABLET BY MOUTH DAILY AT BEDTIME  Dispense: 90  tablet; Refill: 1  4. B12 deficiency Patient with a history of B12 deficiency will continue with B12 supplementation daily. - cyanocobalamin (VITAMIN B12) 1000 MCG tablet; Take 1 tablet (1,000 mcg total) by mouth daily.  Dispense: 90 tablet; Refill: 1  5. Hypokalemia Chronic.  Controlled.  Stable.  Recently has developed low potassium and weight are covering with potassium chloride 20 mill equivalents daily.  - potassium chloride SA (KLOR-CON M) 20 MEQ tablet; TAKE (1) TABLET BY MOUTH EVERY DAY  Dispense: 90 tablet; Refill: 1  6. Elevated transaminase level Elevated transaminases and bilirubin on previous CMP's.  Will check hepatic function panel for current status and proceed accordingly if remains elevated. - Hepatic Function Panel (6)     Elizabeth Sauer, MD

## 2022-10-11 ENCOUNTER — Other Ambulatory Visit: Payer: Self-pay | Admitting: *Deleted

## 2022-10-11 ENCOUNTER — Telehealth: Payer: Self-pay | Admitting: *Deleted

## 2022-10-11 ENCOUNTER — Telehealth: Payer: Self-pay

## 2022-10-11 DIAGNOSIS — E538 Deficiency of other specified B group vitamins: Secondary | ICD-10-CM

## 2022-10-11 DIAGNOSIS — Z08 Encounter for follow-up examination after completed treatment for malignant neoplasm: Secondary | ICD-10-CM

## 2022-10-11 LAB — HEPATIC FUNCTION PANEL (6)
ALT: 82 IU/L — ABNORMAL HIGH (ref 0–32)
AST: 51 IU/L — ABNORMAL HIGH (ref 0–40)
Albumin: 4.3 g/dL (ref 3.7–4.7)
Alkaline Phosphatase: 72 IU/L (ref 44–121)
Bilirubin Total: 2.1 mg/dL — ABNORMAL HIGH (ref 0.0–1.2)
Bilirubin, Direct: 0.44 mg/dL — ABNORMAL HIGH (ref 0.00–0.40)

## 2022-10-11 LAB — LIPID PANEL WITH LDL/HDL RATIO
Cholesterol, Total: 125 mg/dL (ref 100–199)
HDL: 70 mg/dL (ref >39)
LDL Chol Calc (NIH): 37 mg/dL (ref 0–99)
LDL/HDL Ratio: 0.5 ratio (ref 0.0–3.2)
Triglycerides: 94 mg/dL (ref 0–149)
VLDL Cholesterol Cal: 18 mg/dL (ref 5–40)

## 2022-10-11 NOTE — Telephone Encounter (Signed)
Reached out to Dr Smith Robert concerning LFTs by chat- she is ordering a CT as precaution and wants to see the pt 1 week after CT. Megan/ nurse of Smith Robert is getting the CT scheduled and will follow up with pt. In the meantime, I have called pt and told her the plan for CT and to see her in office 1 week later per Smith Robert

## 2022-10-11 NOTE — Telephone Encounter (Signed)
I called Jan E to let him know that the patient has a CT scan chest abdomen and pelvis and that is going to be 716 and at 1 PM but she has to come at 11 AM in order to do the contrast and she cannot eat or drink 4 hours prior to that if you have any questions you can let me know and I also did leave a telephone number that he can call if he has any questions

## 2022-10-13 ENCOUNTER — Encounter: Payer: Self-pay | Admitting: Oncology

## 2022-10-14 ENCOUNTER — Ambulatory Visit: Payer: Self-pay

## 2022-10-14 ENCOUNTER — Encounter: Payer: Medicare PPO | Admitting: Family Medicine

## 2022-10-14 ENCOUNTER — Encounter: Payer: Self-pay | Admitting: Family Medicine

## 2022-10-14 NOTE — Telephone Encounter (Signed)
Chief Complaint: Diarrhea Symptoms: Pt reports watery stools Frequency: onset yesterday and at least 10 episode within 24 hours Pertinent Negatives: Patient denies abdominal pain, blood in stool and other symptoms Disposition: [] ED /[] Urgent Care (no appt availability in office) / [x] Appointment(In office/virtual)/ []  [place] [facility]/ [] Home Care/ [] Refused Recommended Disposition /[] [place] [facility]/ []  Follow-up with PCP Additional Notes: Patient stated she had a sandwich for dinner last night an shortly after she began to have diarrhea episodes. Patient reports at least 10 episodes of diarrhea have occurred since the onset. Advised patient that she would need to be evaluated. Patient is agreeable. Patient has been scheduled with provider today at 1620.   Summary: Diarrhea   Pt has been experiencing diarrhea and has been unable to eat enough. Please advise     Reason for Disposition  [1] SEVERE diarrhea (e.g., 7 or more times / day more than normal) AND [2] age > 60 years  Answer Assessment - Initial Assessment Questions 1. DIARRHEA SEVERITY: "How bad is the diarrhea?" "How many more stools have you had in the past 24 hours than normal?"    - NO DIARRHEA (SCALE 0)   - MILD (SCALE 1-3): Few loose or mushy BMs; increase of 1-3 stools over normal daily number of stools; mild increase in ostomy output.   -  MODERATE (SCALE 4-7): Increase of 4-6 stools daily over normal; moderate increase in ostomy output.   -  SEVERE (SCALE 8-10; OR "WORST POSSIBLE"): Increase of 7 or more stools daily over normal; moderate increase in ostomy output; incontinence.     Maybe 10  2. ONSET: "When did the diarrhea begin?"      Yesterday 3. BM CONSISTENCY: "How loose or watery is the diarrhea?"      Watery 4. VOMITING: "Are you also vomiting?" If Yes, ask: "How many times in the past 24 hours?"      No 5. ABDOMEN PAIN: "Are you having any abdomen pain?" If Yes, ask: "What does it feel like?" (e.g.,  crampy, dull, intermittent, constant)      No 6. ABDOMEN PAIN SEVERITY: If present, ask: "How bad is the pain?"  (e.g., Scale 1-10; mild, moderate, or severe)   - MILD (1-3): doesn't interfere with normal activities, abdomen soft and not tender to touch    - MODERATE (4-7): interferes with normal activities or awakens from sleep, abdomen tender to touch    - SEVERE (8-10): excruciating pain, doubled over, unable to do any normal activities       No 7. ORAL INTAKE: If vomiting, "Have you been able to drink liquids?" "How much liquids have you had in the past 24 hours?"     N/A 8. HYDRATION: "Any signs of dehydration?" (e.g., dry mouth [not just dry lips], too weak to stand, dizziness, new weight loss) "When did you last urinate?"     No I feel fine just having diarrhea 9. EXPOSURE: "Have you traveled to a foreign country recently?" "Have you been exposed to anyone with diarrhea?" "Could you have eaten any food that was spoiled?"     No 10. ANTIBIOTIC USE: "Are you taking antibiotics now or have you taken antibiotics in the past 2 months?"       No 11. OTHER SYMPTOMS: "Do you have any other symptoms?" (e.g., fever, blood in stool)       No  Protocols used: Diarrhea-A-AH

## 2022-10-18 ENCOUNTER — Ambulatory Visit
Admission: RE | Admit: 2022-10-18 | Discharge: 2022-10-18 | Disposition: A | Discharge status: Home / Self Care | Payer: Medicare PPO | Source: Ambulatory Visit | Attending: Oncology | Admitting: Oncology

## 2022-10-18 DIAGNOSIS — Z08 Encounter for follow-up examination after completed treatment for malignant neoplasm: Secondary | ICD-10-CM | POA: Diagnosis not present

## 2022-10-18 DIAGNOSIS — Z85038 Personal history of other malignant neoplasm of large intestine: Secondary | ICD-10-CM | POA: Diagnosis not present

## 2022-10-18 DIAGNOSIS — I728 Aneurysm of other specified arteries: Secondary | ICD-10-CM | POA: Diagnosis not present

## 2022-10-18 DIAGNOSIS — J984 Other disorders of lung: Secondary | ICD-10-CM | POA: Diagnosis not present

## 2022-10-18 DIAGNOSIS — N200 Calculus of kidney: Secondary | ICD-10-CM | POA: Diagnosis not present

## 2022-10-18 MED ORDER — BARIUM SULFATE 2 % PO SUSP
900.0000 mL | Freq: Once | ORAL | Status: AC
  Administered 2022-10-18: 900 mL via ORAL

## 2022-10-18 MED ORDER — IOHEXOL 300 MG/ML  SOLN
75.0000 mL | Freq: Once | INTRAMUSCULAR | Status: AC | PRN
  Administered 2022-10-18: 75 mL via INTRAVENOUS

## 2022-10-18 NOTE — Progress Notes (Signed)
Pt was not seen by MD

## 2022-10-19 DIAGNOSIS — E7800 Pure hypercholesterolemia, unspecified: Secondary | ICD-10-CM | POA: Diagnosis not present

## 2022-10-19 DIAGNOSIS — H353132 Nonexudative age-related macular degeneration, bilateral, intermediate dry stage: Secondary | ICD-10-CM | POA: Diagnosis not present

## 2022-10-19 DIAGNOSIS — H01024 Squamous blepharitis left upper eyelid: Secondary | ICD-10-CM | POA: Diagnosis not present

## 2022-10-19 DIAGNOSIS — H35033 Hypertensive retinopathy, bilateral: Secondary | ICD-10-CM | POA: Diagnosis not present

## 2022-10-19 DIAGNOSIS — Z961 Presence of intraocular lens: Secondary | ICD-10-CM | POA: Diagnosis not present

## 2022-10-19 DIAGNOSIS — H524 Presbyopia: Secondary | ICD-10-CM | POA: Diagnosis not present

## 2022-10-19 DIAGNOSIS — D485 Neoplasm of uncertain behavior of skin: Secondary | ICD-10-CM | POA: Diagnosis not present

## 2022-10-19 DIAGNOSIS — I10 Essential (primary) hypertension: Secondary | ICD-10-CM | POA: Diagnosis not present

## 2022-10-19 DIAGNOSIS — H01021 Squamous blepharitis right upper eyelid: Secondary | ICD-10-CM | POA: Diagnosis not present

## 2022-10-25 ENCOUNTER — Inpatient Hospital Stay: Payer: Medicare PPO | Attending: Oncology | Admitting: Oncology

## 2022-10-25 ENCOUNTER — Encounter: Payer: Self-pay | Admitting: Oncology

## 2022-10-25 ENCOUNTER — Ambulatory Visit: Payer: Medicare PPO | Admitting: Oncology

## 2022-10-25 VITALS — Temp 97.3°F | Wt 103.0 lb

## 2022-10-25 DIAGNOSIS — R7989 Other specified abnormal findings of blood chemistry: Secondary | ICD-10-CM | POA: Insufficient documentation

## 2022-10-25 DIAGNOSIS — Z08 Encounter for follow-up examination after completed treatment for malignant neoplasm: Secondary | ICD-10-CM | POA: Diagnosis not present

## 2022-10-25 DIAGNOSIS — D561 Beta thalassemia: Secondary | ICD-10-CM | POA: Insufficient documentation

## 2022-10-25 DIAGNOSIS — D509 Iron deficiency anemia, unspecified: Secondary | ICD-10-CM | POA: Diagnosis not present

## 2022-10-25 DIAGNOSIS — Z85038 Personal history of other malignant neoplasm of large intestine: Secondary | ICD-10-CM | POA: Insufficient documentation

## 2022-10-25 NOTE — Progress Notes (Signed)
Hematology/Oncology Consult note [facility]  Telephone:([phone] [phone]  Patient Care Team: Duanne Limerick, MD as PCP - General (Family Medicine) Lamar Blinks, MD as Consulting Physician (Cardiology) Benita Gutter, RN as Oncology Nurse Navigator Campbell Lerner, MD as Consulting Physician (General Surgery) Creig Hines, MD as Consulting Physician (Oncology) Kemper Durie, RN as [facility] Care Management   Name of the patient: Phyllis Ross  [id number]  Aug 01, 1935   Date of visit: 10/25/22  Diagnosis- stage III cecal adenocarcinoma currently in remission   Chief complaint/ Reason for visit-discuss CT scan results and further management  Heme/Onc history: Patient is a 87 year old female who was 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  She is s/p laparoscopic right hemicolectomy.  Pathology showed 5.2 x 4.3 x 2.7 cm grade 2 moderately differentiated adenocarcinoma with invasion through muscularis propria into pericolorectal tissue.  7 of 27 lymph nodes positive.  Lymphovascular invasion present but no perineural invasion.  MMR negative.  T3N2BMX.  Right hemicolectomy complicated by anastomotic breakdown/leak which was treated with antibiotics.  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  Also has a history of baseline microcytic anemia secondary to beta thalassemia and baseline hemoglobin is between 10-11.   Patient has met with Dr. Merlene Pulling in the past management chemotherapy options including oral Xeloda has been offered to the patient   Patient completed 8 cycles of adjuvant Xeloda in November 2022.  Patient also has a history of B12 deficiency for which she is on monthly B12 injections    Interval history-patient has baseline fatigue but is overall doing well for her  age.No recent falls or hospitalization.  ECOG PS- 2 Pain scale- 0   Review of systems- Review of Systems  Constitutional:  Positive for malaise/fatigue. Negative for chills, fever and weight loss.  HENT:  Negative for congestion, ear discharge and nosebleeds.   Eyes:  Negative for blurred vision.  Respiratory:  Negative for cough, hemoptysis, sputum production, shortness of breath and wheezing.   Cardiovascular:  Negative for chest pain, palpitations, orthopnea and claudication.  Gastrointestinal:  Negative for abdominal pain, blood in stool, constipation, diarrhea, heartburn, melena, nausea and vomiting.  Genitourinary:  Negative for dysuria, flank pain, frequency, hematuria and urgency.  Musculoskeletal:  Negative for back pain, joint pain and myalgias.  Skin:  Negative for rash.  Neurological:  Negative for dizziness, tingling, focal weakness, seizures, weakness and headaches.  Endo/Heme/Allergies:  Does not bruise/bleed easily.  Psychiatric/Behavioral:  Negative for depression and suicidal ideas. The patient does not have insomnia.       Allergies  Allergen Reactions   Trazodone And Nefazodone Other (See Comments)    Made her pass out     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   a.) stage IIIc (G2, (+) LVI, (-) PNI, T3N3BMX) --> s/p RIGHT hemicolectomy + oral capecitabine   Anemia    Anxiety    a.) on BZO (alprazolam) PRN   Aortic atherosclerosis (HCC)    Arthritis    Beta thalassemia (HCC)    Bradycardia    CAD (coronary artery disease) 08/14/2006   a.) STEMI 08/14/2006 --> LHC [facility] --> EF 45%, 95% mLAD, 95% OM1, and 75% LV groove artery --> transfer to [facility] for CVTS consult; b.) 3v CABG 08/14/2006  Chronic kidney disease    History of kidney stones    Hyperlipidemia    Hypertension    Osteoporosis    Ovarian abscess    Ovarian cancer (HCC) 2005   a.) stage IIIb adenocarcinoma --> s/p CRS 2005 + 6 cycles of  carboplatin/paclitaxel   Pneumonia    as a baby   Rheumatic fever    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   a.) LIMA-LAD, SVG-RI, SVG-OM1   Skin cancer    STEMI (ST elevation myocardial infarction) (HCC) 08/14/2006   a.) LHC [facility] --> transferred to [facility] for emergent CABG     Past Surgical History:  Procedure Laterality Date   COLONOSCOPY  04/04/2009   normal- Dr Mechele Collin   COLONOSCOPY WITH PROPOFOL N/A 06/04/2020   Procedure: COLONOSCOPY WITH PROPOFOL;  Surgeon: Midge Minium, MD;  Location: [facility];  Service: Endoscopy;  Laterality: N/A;   COLONOSCOPY WITH PROPOFOL N/A 01/31/2022   Procedure: COLONOSCOPY WITH PROPOFOL;  Surgeon: Midge Minium, MD;  Location: [facility];  Service: Endoscopy;  Laterality: N/A;   CORONARY ARTERY BYPASS GRAFT N/A 08/14/2006   Procedure: CORONARY ARTERY BYPASS GRAFT; Location: [facility]; Surgeon: Tilford Pillar, MD   CYSTOSCOPY WITH RETROGRADE PYELOGRAM, URETEROSCOPY AND STENT PLACEMENT Right 11/12/2021   Procedure: CYSTOSCOPY WITH RETROGRADE PYELOGRAM / RIGHT URETEROSCOPY/HOLMIUM LASER/RIGHT STENT EXCHANGE;  Surgeon: Sondra Come, MD;  Location: [facility] OR[facility];  Service: Urology;  Laterality: Right;   CYSTOSCOPY/URETEROSCOPY/HOLMIUM LASER/STENT PLACEMENT Right 10/29/2021   Procedure: CYSTOSCOPY/ RIGHT STENT PLACEMENT;  Surgeon: Sondra Come, MD;  Location: [facility] OR[facility];  Service: Urology;  Laterality: Right;   ESOPHAGOGASTRODUODENOSCOPY (EGD) WITH PROPOFOL N/A 06/04/2020   Procedure: ESOPHAGOGASTRODUODENOSCOPY (EGD) WITH PROPOFOL;  Surgeon: Midge Minium, MD;  Location: [facility];  Service: Endoscopy;  Laterality: N/A;   FRACTURE SURGERY     ankle left   LEFT HEART CATH AND CORONARY ANGIOGRAPHY Left 08/14/2006   Procedure: LEFT HEART CATHETERIZATION AND CORONARY ANGIOGRAPHY; Location: [facility]: Surgeon: Rudean Hitt, MD   VAGINAL HYSTERECTOMY     DUB    Social History   Socioeconomic History   Marital status: Married    Spouse name: Caro Laroche    Number of children: 2   Years of education: Not on file   Highest education level: Some college, no degree  Occupational History    Employer: RETIRED  Tobacco Use   Smoking status: Never    Passive exposure: Never   Smokeless tobacco: Never  Vaping Use   Vaping status: Never Used  Substance and Sexual Activity   Alcohol use: No   Drug use: No   Sexual activity: Yes  Other Topics Concern   Not on file  Social History Narrative   Lives at home with husband.   Social Determinants of Health   Financial Resource Strain: Low Risk  (02/03/2021)   Overall Financial Resource Strain (CARDIA)    Difficulty of Paying Living Expenses: Not hard at all  Food Insecurity: No Food Insecurity (06/22/2022)   Hunger Vital Sign    Worried About Running Out of Food in the Last Year: Never true    Ran Out of Food in the Last Year: Never true  Transportation Needs: No Transportation Needs (06/22/2022)   PRAPARE - Administrator, Civil Service (Medical): No    Lack of Transportation (Non-Medical): No  Physical Activity: Sufficiently Active (02/09/2022)   Exercise Vital Sign    Days of Exercise per Week: 3 days    Minutes of Exercise per  Session: 50 min  Stress: No Stress Concern Present (02/09/2022)   [employer] of Occupational Health - Occupational Stress Questionnaire    Feeling of Stress : Not at all  Social Connections: Moderately Integrated (02/09/2022)   Social Connection and Isolation Panel [NHANES]    Frequency of Communication with Friends and Family: More than three times a week    Frequency of Social Gatherings with Friends and Family: Three times a week    Attends Religious Services: Never    Active Member of Clubs or Organizations: Yes    Attends Banker Meetings: More than 4 times per year    Marital Status: Married  Catering manager Violence: Not At Risk (02/03/2021)   Humiliation, Afraid, Rape, and Kick questionnaire    Fear of Current or Ex-Partner: No     Emotionally Abused: No    Physically Abused: No    Sexually Abused: No    Family History  Problem Relation Age of Onset   Heart attack Mother    Heart attack Maternal Uncle    Heart attack Maternal Uncle      Current Outpatient Medications:    ALPRAZolam (XANAX) 0.25 MG tablet, Take 1 tablet (0.25 mg total) by mouth daily as needed for anxiety., Disp: 20 tablet, Rfl: 0   atorvastatin (LIPITOR) 80 MG tablet, TAKE (1) TABLET BY MOUTH EVERY DAY, Disp: 90 tablet, Rfl: 1   Cyanocobalamin (VITAMIN B-12 IJ), Inject 1,000 vials as directed every 30 (thirty) days. Patient Not sure of dosage, Disp: , Rfl:    cyanocobalamin (VITAMIN B12) 1000 MCG tablet, Take 1 tablet (1,000 mcg total) by mouth daily., Disp: 90 tablet, Rfl: 1   ferrous sulfate 325 (65 FE) MG tablet, Take 1 tablet (325 mg total) by mouth daily with breakfast., Disp: 90 tablet, Rfl: 1   meloxicam (MOBIC) 7.5 MG tablet, Take 1 tablet (7.5 mg total) by mouth daily., Disp: 30 tablet, Rfl: 2   montelukast (SINGULAIR) 10 MG tablet, TAKE (1) TABLET BY MOUTH DAILY AT BEDTIME, Disp: 90 tablet, Rfl: 1   mupirocin ointment (BACTROBAN) 2 %, Apply 1 Application topically 2 (two) times daily., Disp: 22 g, Rfl: 0   Nutritional Supplements (ENSURE COMPLETE PO), Take 1 Can by mouth daily., Disp: , Rfl:    potassium chloride SA (KLOR-CON M) 20 MEQ tablet, TAKE (1) TABLET BY MOUTH EVERY DAY, Disp: 90 tablet, Rfl: 1   fluorouracil (EFUDEX) 5 % cream, Apply topically. (Patient not taking: Reported on 10/25/2022), Disp: , Rfl:   Physical exam:  Vitals:   10/25/22 1006  Temp: (!) 97.3 F (36.3 C)  TempSrc: Tympanic  SpO2: 99%  Weight: 103 lb (46.7 kg)   Physical Exam Constitutional:      Comments: Sitting in a wheelchair.  Appears in no acute distress  Cardiovascular:     Rate and Rhythm: Normal rate and regular rhythm.     Heart sounds: Normal heart sounds.  Pulmonary:     Effort: Pulmonary effort is normal.     Breath sounds: Normal  breath sounds.  Abdominal:     General: Bowel sounds are normal.     Palpations: Abdomen is soft.  Skin:    General: Skin is warm and dry.  Neurological:     Mental Status: She is alert and oriented to person, place, and time.         Latest Ref Rng & Units 10/10/2022    9:57 AM  CMP  Total Bilirubin 0.0 - 1.2 mg/dL 2.1  Alkaline Phos 44 - 121 IU/L 72   AST 0 - 40 IU/L 51   ALT 0 - 32 IU/L 82       Latest Ref Rng & Units 07/01/2022   11:00 AM  CBC  WBC 4.0 - 10.5 K/uL 6.3   Hemoglobin 12.0 - 15.0 g/dL 16.1   Hematocrit 09.6 - 46.0 % 37.7   Platelets 150 - 400 K/uL 234     No images are attached to the encounter.  CT CHEST ABDOMEN PELVIS W CONTRAST  Result Date: 10/18/2022 CLINICAL DATA:  Colon cancer diagnosed in 2022. Colon resection and chemotherapy. Asymptomatic. * Tracking Code: BO * EXAM: CT CHEST, ABDOMEN, AND PELVIS WITH CONTRAST TECHNIQUE: Multidetector CT imaging of the chest, abdomen and pelvis was performed following the standard protocol during bolus administration of intravenous contrast. RADIATION DOSE REDUCTION: This exam was performed according to the departmental dose-optimization program which includes automated exposure control, adjustment of the mA and/or kV according to patient size and/or use of iterative reconstruction technique. CONTRAST:  75mL OMNIPAQUE IOHEXOL 300 MG/ML  SOLN COMPARISON:  Clinic note of 07/03/2022. Most recent CTs of 02/10/2022 FINDINGS: CT CHEST FINDINGS Cardiovascular: Aortic atherosclerosis. Bovine arch. Normal heart size, without pericardial effusion. Median sternotomy for CABG. No central pulmonary embolism, on this non-dedicated study. Mediastinum/Nodes: No supraclavicular adenopathy. No mediastinal or hilar adenopathy. Lungs/Pleura: No pleural fluid. Biapical pleuroparenchymal scarring. Presumably postinfectious/inflammatory scarring also identified within the apices of both lower lobes, similar. Branching soft tissue density within  the right lower lobe including at 9 mm on 140/3 similar back to 03/08/2021. New mucoid impaction within the posteromedial right lower lobe on 77/3. Similar mucoid impaction within the inferolateral lingula including on 103/3. No findings of pulmonary metastasis. Musculoskeletal: No acute osseous abnormality. CT ABDOMEN PELVIS FINDINGS Hepatobiliary: Normal liver. Normal gallbladder, without biliary ductal dilatation. Pancreas: Pancreatic tail 6 mm hypoattenuating lesion similar on 66/2. No duct dilatation or acute inflammation. Spleen: Normal in size, without focal abnormality. Adrenals/Urinary Tract: Normal adrenal glands. Left larger than right well-circumscribed low-density renal lesions are likely cysts and minimally complex cysts. Dominant interpolar left renal 6.4 cm cyst . In the absence of clinically indicated signs/symptoms require(s) no independent follow-up. No hydronephrosis. Punctate bilateral renal collecting system calculi. Normal urinary bladder. Stomach/Bowel: Normal stomach, without wall thickening. Right hemicolectomy. Normal small bowel. Vascular/Lymphatic: Aortic atherosclerosis. 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 Abdominopelvic node dissection. No abdominopelvic adenopathy. Reproductive: Hysterectomy.  No adnexal mass. Other: No significant free fluid. Mild pelvic floor laxity. No evidence of omental or peritoneal disease. Musculoskeletal: Degenerative changes of the right greater than left hips. Lumbosacral spondylosis. IMPRESSION: 1. Status post right hemicolectomy, without recurrent or metastatic disease. 2. Right lower lobe branching density is similar back to 2022, consistent with a benign etiology. Favor mucoid impaction or prior contrast aspiration. Other areas of pulmonary scarring and mucoid impaction are primarily similar. 3. Similar 6 mm pancreatic tail cystic lesion. Indolent cystic neoplasm versus pseudocyst. Recommend attention on follow-up. Patient is not planned for  follow-up imaging, pre and post contrast abdominal MRI or pancreatic protocol CT at 2 years is recommended. 4. Similar splenic artery aneurysm of 1.3 cm. 5. Bilateral nephrolithiasis. Electronically Signed   By: Jeronimo Greaves M.D.   On: 10/18/2022 15:53     Assessment and plan- Patient is a 87 y.o. female with history of stage IIIc adenocarcinoma of the colon s/p 6 cycles of adjuvant Xeloda.  She is here for a routine f/u visit  I have  reviewed CT chest abdomen and pelvis images independently and discussed findings with the patient which does not show any evidence of recurrent or progressive disease.Her liver appeared normal on CT scan.  Patient does have abnormal LFTs and her bilirubin has been fluctuating between 1.5-2 over the last 2 years since March 2022 even before she started Xeloda.  There is no further increase  her bilirubin.  Over the last 1 year patient has mild elevation in her AST and ALT which has again remained stable.  I am referring her to [facility] for further evaluation of LFTs.  I have reassured the patient and her daughter that this is not an urgent or life threatening issue and the synthetic functions of the liver are still normal.  I will see her back in 6 months with labs   Visit Diagnosis 1. Encounter for follow-up surveillance of colon cancer   2. Elevated LFTs      Dr. Owens Shark, MD, MPH [facility] [phone] 10/25/2022 2:25 PM

## 2022-10-26 ENCOUNTER — Ambulatory Visit (INDEPENDENT_AMBULATORY_CARE_PROVIDER_SITE_OTHER): Payer: Medicare PPO

## 2022-10-26 DIAGNOSIS — E538 Deficiency of other specified B group vitamins: Secondary | ICD-10-CM

## 2022-10-26 MED ORDER — CYANOCOBALAMIN 1000 MCG/ML IJ SOLN
1000.0000 ug | Freq: Once | INTRAMUSCULAR | Status: AC
Start: 2022-10-26 — End: 2022-10-26
  Administered 2022-10-26: 1000 ug via INTRAMUSCULAR

## 2022-10-31 ENCOUNTER — Other Ambulatory Visit: Payer: Self-pay | Admitting: Family Medicine

## 2022-10-31 DIAGNOSIS — M25552 Pain in left hip: Secondary | ICD-10-CM

## 2022-11-02 ENCOUNTER — Telehealth: Payer: Self-pay

## 2022-11-02 DIAGNOSIS — Z7189 Other specified counseling: Secondary | ICD-10-CM | POA: Diagnosis not present

## 2022-11-02 DIAGNOSIS — L570 Actinic keratosis: Secondary | ICD-10-CM | POA: Diagnosis not present

## 2022-11-02 DIAGNOSIS — C44619 Basal cell carcinoma of skin of left upper limb, including shoulder: Secondary | ICD-10-CM | POA: Diagnosis not present

## 2022-11-02 DIAGNOSIS — C44719 Basal cell carcinoma of skin of left lower limb, including hip: Secondary | ICD-10-CM | POA: Diagnosis not present

## 2022-11-02 DIAGNOSIS — X32XXXA Exposure to sunlight, initial encounter: Secondary | ICD-10-CM | POA: Diagnosis not present

## 2022-11-02 DIAGNOSIS — L821 Other seborrheic keratosis: Secondary | ICD-10-CM | POA: Diagnosis not present

## 2022-11-02 DIAGNOSIS — D485 Neoplasm of uncertain behavior of skin: Secondary | ICD-10-CM | POA: Diagnosis not present

## 2022-11-02 NOTE — Telephone Encounter (Signed)
Ok with me 

## 2022-11-02 NOTE — Telephone Encounter (Signed)
Received a referral From Dr. Smith Robert for Elevated LFTs and colonoscopy screening. Called patient and patient is wanting to see Dr. Tobi Bastos and not Dr. Servando Snare her reason is because she wants to stay in the same family since her cancer doctor is Dr. Smith Robert and Dr. Tobi Bastos is her husband. She states she feels she will get better care if she in the same family

## 2022-11-03 NOTE — Telephone Encounter (Signed)
Made appointment for 12/26/2022

## 2022-11-17 DIAGNOSIS — C44719 Basal cell carcinoma of skin of left lower limb, including hip: Secondary | ICD-10-CM | POA: Diagnosis not present

## 2022-11-17 DIAGNOSIS — D485 Neoplasm of uncertain behavior of skin: Secondary | ICD-10-CM | POA: Diagnosis not present

## 2022-11-17 DIAGNOSIS — C44619 Basal cell carcinoma of skin of left upper limb, including shoulder: Secondary | ICD-10-CM | POA: Diagnosis not present

## 2022-11-17 DIAGNOSIS — Z7189 Other specified counseling: Secondary | ICD-10-CM | POA: Diagnosis not present

## 2022-11-17 DIAGNOSIS — D0439 Carcinoma in situ of skin of other parts of face: Secondary | ICD-10-CM | POA: Diagnosis not present

## 2022-11-29 ENCOUNTER — Other Ambulatory Visit: Payer: Self-pay | Admitting: Family Medicine

## 2022-11-29 DIAGNOSIS — M25551 Pain in right hip: Secondary | ICD-10-CM

## 2022-11-30 ENCOUNTER — Ambulatory Visit (INDEPENDENT_AMBULATORY_CARE_PROVIDER_SITE_OTHER): Payer: Medicare PPO

## 2022-11-30 DIAGNOSIS — E538 Deficiency of other specified B group vitamins: Secondary | ICD-10-CM

## 2022-11-30 MED ORDER — CYANOCOBALAMIN 1000 MCG/ML IJ SOLN
1000.0000 ug | Freq: Once | INTRAMUSCULAR | Status: AC
Start: 2022-11-30 — End: 2022-11-30
  Administered 2022-11-30: 1000 ug via INTRAMUSCULAR

## 2022-11-30 NOTE — Telephone Encounter (Signed)
Labs in date  Requested Prescriptions  Pending Prescriptions Disp Refills   meloxicam (MOBIC) 7.5 MG tablet [Pharmacy Med Name: MELOXICAM TAB 7.5MG ] 30 tablet 1    Sig: TAKE (1) TABLET BY MOUTH EVERY DAY     Analgesics:  COX2 Inhibitors Failed - 11/29/2022  3:43 PM      Failed - Manual Review: Labs are only required if the patient has taken medication for more than 8 weeks.      Failed - HGB in normal range and within 360 days    Hemoglobin  Date Value Ref Range Status  07/01/2022 11.6 (L) 12.0 - 15.0 g/dL Final  95/62/1308 65.7 (L) 11.1 - 15.9 g/dL Final         Failed - AST in normal range and within 360 days    AST  Date Value Ref Range Status  10/10/2022 51 (H) 0 - 40 IU/L Final   SGOT(AST)  Date Value Ref Range Status  06/22/2012 22 15 - 37 Unit/L Final         Failed - ALT in normal range and within 360 days    ALT  Date Value Ref Range Status  10/10/2022 82 (H) 0 - 32 IU/L Final   SGPT (ALT)  Date Value Ref Range Status  06/22/2012 26 12 - 78 U/L Final         Passed - Cr in normal range and within 360 days    Creatinine  Date Value Ref Range Status  06/22/2012 1.19 0.60 - 1.30 mg/dL Final   Creatinine, Ser  Date Value Ref Range Status  07/01/2022 0.85 0.44 - 1.00 mg/dL Final         Passed - HCT in normal range and within 360 days    HCT  Date Value Ref Range Status  07/01/2022 37.7 36.0 - 46.0 % Final   Hematocrit  Date Value Ref Range Status  04/07/2020 37.3 34.0 - 46.6 % Final         Passed - eGFR is 30 or above and within 360 days    EGFR (African American)  Date Value Ref Range Status  06/22/2012 52 (L)  Final   GFR calc Af Amer  Date Value Ref Range Status  04/07/2020 60 >59 mL/min/1.73 Final    Comment:    **In accordance with recommendations from the NKF-ASN Task force,**   [facility] is in the process of updating its eGFR calculation to the   2021 CKD-EPI creatinine equation that estimates kidney function   without a race variable.     EGFR (Non-African Amer.)  Date Value Ref Range Status  06/22/2012 45 (L)  Final    Comment:    eGFR values <16mL/min/1.73 m2 may be an indication of chronic kidney disease (CKD). Calculated eGFR is useful in patients with stable renal function. The eGFR calculation will not be reliable in acutely ill patients when serum creatinine is changing rapidly. It is not useful in  patients on dialysis. The eGFR calculation may not be applicable to patients at the low and high extremes of body sizes, pregnant women, and vegetarians.    GFR, Estimated  Date Value Ref Range Status  07/01/2022 >60 >60 mL/min Final    Comment:    (NOTE) Calculated using the CKD-EPI Creatinine Equation (2021)          Passed - Patient is not pregnant      Passed - Valid encounter within last 12 months    Recent Outpatient Visits  1 month ago Familial multiple lipoprotein-type hyperlipidemia   [facility], MD   3 months ago Cellulitis of right lower extremity   [facility] In[facility], MD   4 months ago Primary osteoarthritis of right hip   [facility]on, Ocie Bob, MD   8 months ago Familial multiple lipoprotein-type hyperlipidemia   [facility] In[facility], MD   10 months ago Painful urination   [facility] Inches, MD       Future Appointments             In 3 weeks Wyline Mood, MD [facility]   In 4 months Duanne Limerick, MD [facility]   In 6 months Richardo Hanks, Laurette Schimke, MD [facility] [place]

## 2022-12-07 DIAGNOSIS — C44329 Squamous cell carcinoma of skin of other parts of face: Secondary | ICD-10-CM | POA: Diagnosis not present

## 2022-12-07 DIAGNOSIS — C44319 Basal cell carcinoma of skin of other parts of face: Secondary | ICD-10-CM | POA: Diagnosis not present

## 2022-12-07 DIAGNOSIS — D0439 Carcinoma in situ of skin of other parts of face: Secondary | ICD-10-CM | POA: Diagnosis not present

## 2022-12-07 DIAGNOSIS — C4442 Squamous cell carcinoma of skin of scalp and neck: Secondary | ICD-10-CM | POA: Diagnosis not present

## 2022-12-09 DIAGNOSIS — C44329 Squamous cell carcinoma of skin of other parts of face: Secondary | ICD-10-CM | POA: Diagnosis not present

## 2022-12-14 DIAGNOSIS — X32XXXA Exposure to sunlight, initial encounter: Secondary | ICD-10-CM | POA: Diagnosis not present

## 2022-12-14 DIAGNOSIS — L570 Actinic keratosis: Secondary | ICD-10-CM | POA: Diagnosis not present

## 2022-12-22 ENCOUNTER — Ambulatory Visit (INDEPENDENT_AMBULATORY_CARE_PROVIDER_SITE_OTHER): Payer: Medicare PPO

## 2022-12-22 DIAGNOSIS — Z23 Encounter for immunization: Secondary | ICD-10-CM | POA: Diagnosis not present

## 2022-12-23 ENCOUNTER — Telehealth: Payer: Self-pay | Admitting: Family Medicine

## 2022-12-23 NOTE — Telephone Encounter (Signed)
Copied from CRM [id number]. Topic: General - Other >> Dec 23, 2022  1:30 PM Clide Dales wrote: Patient is requesting a callback from [place].

## 2022-12-26 ENCOUNTER — Encounter: Payer: Self-pay | Admitting: Gastroenterology

## 2022-12-26 ENCOUNTER — Ambulatory Visit: Payer: Medicare PPO | Admitting: Gastroenterology

## 2022-12-26 VITALS — BP 139/81 | HR 58 | Temp 98.3°F | Ht 65.0 in | Wt 102.1 lb

## 2022-12-26 DIAGNOSIS — R7989 Other specified abnormal findings of blood chemistry: Secondary | ICD-10-CM

## 2022-12-26 DIAGNOSIS — R7401 Elevation of levels of liver transaminase levels: Secondary | ICD-10-CM | POA: Diagnosis not present

## 2022-12-26 NOTE — Addendum Note (Signed)
Addended by: Tawnya Crook on: 12/26/2022 02:56 PM   Modules accepted: Orders

## 2022-12-26 NOTE — Progress Notes (Signed)
Phyllis Mood MD, MRCP(U.K) [address]  [address]  [place], [place] [address]  Main: [phone]  Fax: [phone]   Primary Care Physician: Duanne Limerick, MD  Primary Gastroenterologist:  Dr. Wyline Ross   Chief Complaint  Patient presents with   Establish Care    HPI: Phyllis Ross is a 87 y.o. female is a patient who has been seen by Dr. Servando Snare in the past for blood in stool back in February 2022.  At that time she had a hemoglobin of 10.9 g.  She stated that she had thalassemia.  She had an upper and diascopy in February 2022 for iron deficiency anemia showed inflammation in the gastric antrum.  A colonoscopy was performed at the same time that showed nonbleeding internal hemorrhoids and malignant partially obstructing tumor in the cecum.  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  7 out of 27 lymph nodes were positive.  She follows with Dr. Smith Robert in oncology status post right hemicolectomy the surgery was complicated by anastomotic breakdown leak which was treated with antibiotics history of ovarian cancer as well she has stage III adenocarcinoma of the colon.  She has been presentlyFor for abnormal LFTs.  LFTs in July 2024 showed AST of 51 ALT of 82 which has been abnormal for at least 1 year prior.  CEA was 5.95 months back.  CT scan of the chest abdomen and pelvis in July 2024 showed cystic lesion in the tail of the pancreas measuring 6 mm recommended an MRI in 2 years.   She is doing well no complaints.  Here to see me with her son.  Denies any new medications recently.  No over-the-counter medications.  No herbal supplements.  No excess alcohol consumption.  No tattoos.  No blood transfusions.      Latest Ref Rng & Units 10/10/2022    9:57 AM 07/01/2022   11:00 AM 02/16/2022    1:01 PM  Hepatic Function  Total Protein 6.5 - 8.1 g/dL  7.4  7.0   Albumin 3.7 - 4.7 g/dL 4.3  3.8  3.7   AST 0 - 40 IU/L 51  49   51   ALT 0 - 32 IU/L 82  70  80   Alk Phosphatase 44 - 121 IU/L 72  67  65   Total Bilirubin 0.0 - 1.2 mg/dL 2.1  1.8  1.4   Bilirubin, Direct 0.00 - 0.40 mg/dL 5.36       Current Outpatient Medications  Medication Sig Dispense Refill   ALPRAZolam (XANAX) 0.25 MG tablet Take 1 tablet (0.25 mg total) by mouth daily as needed for anxiety. 20 tablet 0   atorvastatin (LIPITOR) 80 MG tablet TAKE (1) TABLET BY MOUTH EVERY DAY 90 tablet 1   Cyanocobalamin (VITAMIN B-12 IJ) Inject 1,000 vials as directed every 30 (thirty) days. Patient Not sure of dosage     cyanocobalamin (VITAMIN B12) 1000 MCG tablet Take 1 tablet (1,000 mcg total) by mouth daily. 90 tablet 1   ferrous sulfate 325 (65 FE) MG tablet Take 1 tablet (325 mg total) by mouth daily with breakfast. 90 tablet 1   fluorouracil (EFUDEX) 5 % cream Apply topically.     meloxicam (MOBIC) 7.5 MG tablet TAKE (1) TABLET BY MOUTH EVERY DAY 30 tablet 1   montelukast (SINGULAIR) 10 MG tablet TAKE (1) TABLET BY MOUTH DAILY AT BEDTIME 90 tablet 1   mupirocin ointment (BACTROBAN) 2 % Apply 1  Application topically 2 (two) times daily. 22 g 0   Nutritional Supplements (ENSURE COMPLETE PO) Take 1 Can by mouth daily.     potassium chloride SA (KLOR-CON M) 20 MEQ tablet TAKE (1) TABLET BY MOUTH EVERY DAY 90 tablet 1   No current facility-administered medications for this visit.    Allergies as of 12/26/2022 - Review Complete 12/26/2022  Allergen Reaction Noted   Trazodone and nefazodone Other (See Comments) 10/06/2020      ROS:  General: Negative for anorexia, weight loss, fever, chills, fatigue, weakness. ENT: Negative for hoarseness, difficulty swallowing , nasal congestion. CV: Negative for chest pain, angina, palpitations, dyspnea on exertion, peripheral edema.  Respiratory: Negative for dyspnea at rest, dyspnea on exertion, cough, sputum, wheezing.  GI: See history of present illness. GU:  Negative for dysuria, hematuria, urinary  incontinence, urinary frequency, nocturnal urination.  Endo: Negative for unusual weight change.    Physical Examination:   BP 139/81   Pulse (!) 58   Temp 98.3 F (36.8 C) (Oral)   Ht 5\' 5"  (1.651 m)   Wt 102 lb 2 oz (46.3 kg)   BMI 16.99 kg/m   General: Well-nourished, well-developed in no acute distress.  Eyes: No icterus. Conjunctivae pink. Mouth: Oropharyngeal mucosa moist and pink , no lesions erythema or exudate. Neuro: Alert and oriented x 3.  Grossly intact. Skin: Warm and dry, no jaundice.   Psych: Alert and cooperative, normal Ross and affect.   Imaging Studies: No results found.  Assessment and Plan:   Phyllis Ross is a 87 y.o. y/o female here to see me to transfer care for abnormal LFTs.  Diagnosed 2 years back with cecal adenocarcinoma with 7 out of 27 lymph nodes that were positive status postchemotherapy.  Mild elevation in transaminases for the past 1 year.  No contributing factors based on history.  Plan 1.  Full autoimmune and viral hepatitis panel 2.  Right upper quadrant ultrasound 3.  If above are negative we will closely monitor LFTs as long as they are stable we will not perform any further tests if they were to rise then we will need a liver biopsy at that point    Dr Phyllis Mood  MD,MRCP [facility]) Follow up in 4 weeks video visit

## 2022-12-26 NOTE — Patient Instructions (Addendum)
Please call central scheduling at [phone] for your ultrasound   Please schedule MyChart Video Visit in 4 weeks

## 2022-12-28 ENCOUNTER — Telehealth: Payer: Self-pay

## 2022-12-28 LAB — ALPHA-1-ANTITRYPSIN: A-1 Antitrypsin: 184 mg/dL (ref 101–187)

## 2022-12-28 LAB — HEPATITIS B E ANTIGEN: Hep B E Ag: NEGATIVE

## 2022-12-28 LAB — CK: Total CK: 66 U/L (ref 26–161)

## 2022-12-28 LAB — MITOCHONDRIAL/SMOOTH MUSCLE AB PNL
Mitochondrial Ab: 22.1 Units — ABNORMAL HIGH (ref 0.0–20.0)
Smooth Muscle Ab: 23 Units — ABNORMAL HIGH (ref 0–19)

## 2022-12-28 LAB — HEPATITIS B CORE ANTIBODY, TOTAL: Hep B Core Total Ab: NEGATIVE

## 2022-12-28 LAB — IRON,TIBC AND FERRITIN PANEL
Ferritin: 322 ng/mL — ABNORMAL HIGH (ref 15–150)
Iron Saturation: 23 % (ref 15–55)
Iron: 84 ug/dL (ref 27–139)
Total Iron Binding Capacity: 361 ug/dL (ref 250–450)
UIBC: 277 ug/dL (ref 118–369)

## 2022-12-28 LAB — IMMUNOGLOBULINS A/E/G/M, SERUM
IgA/Immunoglobulin A, Serum: 340 mg/dL (ref 64–422)
IgE (Immunoglobulin E), Serum: 73 [IU]/mL (ref 6–495)
IgG (Immunoglobin G), Serum: 1292 mg/dL (ref 586–1602)
IgM (Immunoglobulin M), Srm: 45 mg/dL (ref 26–217)

## 2022-12-28 LAB — HEPATITIS B SURFACE ANTIBODY,QUALITATIVE: Hep B Surface Ab, Qual: NONREACTIVE

## 2022-12-28 LAB — CELIAC DISEASE AB SCREEN W/RFX
Antigliadin Abs, IgA: 4 units (ref 0–19)
Transglutaminase IgA: <2 U/mL (ref 0–3)

## 2022-12-28 LAB — HEPATITIS B E ANTIBODY: Hep B E Ab: NONREACTIVE

## 2022-12-28 LAB — HIV ANTIBODY (ROUTINE TESTING W REFLEX): HIV Screen 4th Generation wRfx: NONREACTIVE

## 2022-12-28 LAB — CERULOPLASMIN: Ceruloplasmin: 23.6 mg/dL (ref 19.0–39.0)

## 2022-12-28 LAB — ANTI-MICROSOMAL ANTIBODY LIVER / KIDNEY: LKM1 Ab: 0.6 Units (ref 0.0–20.0)

## 2022-12-28 LAB — HEPATITIS C ANTIBODY: Hep C Virus Ab: NONREACTIVE

## 2022-12-28 LAB — HEPATITIS A ANTIBODY, TOTAL: hep A Total Ab: NEGATIVE

## 2022-12-28 LAB — GAMMA GT: GGT: 12 IU/L (ref 0–60)

## 2022-12-28 LAB — HEPATITIS B SURFACE ANTIGEN: Hepatitis B Surface Ag: NEGATIVE

## 2022-12-28 LAB — ANA: Anti Nuclear Antibody (ANA): POSITIVE — AB

## 2022-12-28 NOTE — Telephone Encounter (Signed)
Called [facility] and let them know that Dr. Tobi Bastos wanted to add a CMP to an existing lab order. I spoke with Reita Cliche and she stated that she would add it. They will also fax [place] a form to complete.

## 2022-12-28 NOTE — Telephone Encounter (Signed)
-----   Message from Wyline Mood sent at 12/27/2022  1:38 PM EDT ----- Can we add cmp to labs from yesterday please

## 2022-12-28 NOTE — Addendum Note (Signed)
Addended by: Adela Ports on: 12/28/2022 08:24 AM   Modules accepted: Orders

## 2023-01-03 ENCOUNTER — Ambulatory Visit (INDEPENDENT_AMBULATORY_CARE_PROVIDER_SITE_OTHER): Payer: Medicare PPO

## 2023-01-03 DIAGNOSIS — E538 Deficiency of other specified B group vitamins: Secondary | ICD-10-CM | POA: Diagnosis not present

## 2023-01-03 MED ORDER — CYANOCOBALAMIN 1000 MCG/ML IJ SOLN
1000.0000 ug | Freq: Once | INTRAMUSCULAR | Status: AC
Start: 2023-01-03 — End: 2023-01-03
  Administered 2023-01-03: 1000 ug via INTRAMUSCULAR

## 2023-01-04 ENCOUNTER — Other Ambulatory Visit: Payer: Medicare PPO

## 2023-01-04 ENCOUNTER — Ambulatory Visit: Payer: Medicare PPO | Admitting: Oncology

## 2023-01-09 ENCOUNTER — Ambulatory Visit
Admission: RE | Admit: 2023-01-09 | Discharge: 2023-01-09 | Disposition: A | Discharge status: Home / Self Care | Payer: Medicare PPO | Source: Ambulatory Visit | Attending: Gastroenterology | Admitting: Gastroenterology

## 2023-01-09 DIAGNOSIS — R7989 Other specified abnormal findings of blood chemistry: Secondary | ICD-10-CM | POA: Diagnosis not present

## 2023-01-12 ENCOUNTER — Other Ambulatory Visit: Payer: Self-pay | Admitting: Family Medicine

## 2023-01-12 DIAGNOSIS — M25551 Pain in right hip: Secondary | ICD-10-CM

## 2023-01-12 NOTE — Telephone Encounter (Signed)
Requested Prescriptions  Pending Prescriptions Disp Refills   meloxicam (MOBIC) 7.5 MG tablet [Pharmacy Med Name: MELOXICAM TAB 7.5MG ] 90 tablet 0    Sig: TAKE (1) TABLET BY MOUTH EVERY DAY     Analgesics:  COX2 Inhibitors Failed - 01/12/2023 10:31 AM      Failed - Manual Review: Labs are only required if the patient has taken medication for more than 8 weeks.      Failed - HGB in normal range and within 360 days    Hemoglobin  Date Value Ref Range Status  07/01/2022 11.6 (L) 12.0 - 15.0 g/dL Final  41/32/4401 02.7 (L) 11.1 - 15.9 g/dL Final         Failed - ALT in normal range and within 360 days    ALT  Date Value Ref Range Status  12/26/2022 41 (H) 0 - 32 IU/L Final   SGPT (ALT)  Date Value Ref Range Status  06/22/2012 26 12 - 78 U/L Final         Passed - Cr in normal range and within 360 days    Creatinine  Date Value Ref Range Status  06/22/2012 1.19 0.60 - 1.30 mg/dL Final   Creatinine, Ser  Date Value Ref Range Status  12/26/2022 0.95 0.57 - 1.00 mg/dL Final         Passed - HCT in normal range and within 360 days    HCT  Date Value Ref Range Status  07/01/2022 37.7 36.0 - 46.0 % Final   Hematocrit  Date Value Ref Range Status  04/07/2020 37.3 34.0 - 46.6 % Final         Passed - AST in normal range and within 360 days    AST  Date Value Ref Range Status  12/26/2022 35 0 - 40 IU/L Final   SGOT(AST)  Date Value Ref Range Status  06/22/2012 22 15 - 37 Unit/L Final         Passed - eGFR is 30 or above and within 360 days    EGFR (African American)  Date Value Ref Range Status  06/22/2012 52 (L)  Final   GFR calc Af Amer  Date Value Ref Range Status  04/07/2020 60 >59 mL/min/1.73 Final    Comment:    **In accordance with recommendations from the NKF-ASN Task force,**   [facility] is in the process of updating its eGFR calculation to the   2021 CKD-EPI creatinine equation that estimates kidney function   without a race variable.    EGFR  (Non-African Amer.)  Date Value Ref Range Status  06/22/2012 45 (L)  Final    Comment:    eGFR values <75mL/min/1.73 m2 may be an indication of chronic kidney disease (CKD). Calculated eGFR is useful in patients with stable renal function. The eGFR calculation will not be reliable in acutely ill patients when serum creatinine is changing rapidly. It is not useful in  patients on dialysis. The eGFR calculation may not be applicable to patients at the low and high extremes of body sizes, pregnant women, and vegetarians.    GFR, Estimated  Date Value Ref Range Status  07/01/2022 >60 >60 mL/min Final    Comment:    (NOTE) Calculated using the CKD-EPI Creatinine Equation (2021)    eGFR  Date Value Ref Range Status  12/26/2022 59 (L) >59 mL/min/1.73 Final         Passed - Patient is not pregnant      Passed - Valid encounter within  last 12 months    Recent Outpatient Visits           3 months ago Familial multiple lipoprotein-type hyperlipidemia   [facility] In[facility], MD   4 months ago Cellulitis of right lower extremity   [place] [facility] Inches, MD   5 months ago Primary osteoarthritis of right hip   [facility] Loron, Ocie Bob, MD   9 months ago Familial multiple lipoprotein-type hyperlipidemia   [facility] Inches, MD   12 months ago Painful urination   [facility] Inches, MD       Future Appointments             In 2 months Duanne Limerick, MD [facility] at [facility], [facility]   In 4 months Richardo Hanks, Laurette Schimke, MD [facility] [place]

## 2023-01-20 ENCOUNTER — Telehealth: Payer: Self-pay

## 2023-01-20 NOTE — Telephone Encounter (Signed)
Called patient to let her know the below information and she agreed on coming back in 3 months to have her LFT's rechecked. I told her that I had put her in our recall list to remind her in 3 months to come in the end of December to have the labs drawn. Patient understood and had no further questions.

## 2023-01-20 NOTE — Telephone Encounter (Signed)
-----   Message from Wyline Mood sent at 01/19/2023 11:47 AM EDT ----- LFTs almost back to normal recheck in 3 months

## 2023-02-06 ENCOUNTER — Ambulatory Visit (INDEPENDENT_AMBULATORY_CARE_PROVIDER_SITE_OTHER): Payer: Medicare PPO

## 2023-02-06 DIAGNOSIS — E538 Deficiency of other specified B group vitamins: Secondary | ICD-10-CM | POA: Diagnosis not present

## 2023-02-06 MED ORDER — CYANOCOBALAMIN 1000 MCG/ML IJ SOLN
1000.0000 ug | Freq: Once | INTRAMUSCULAR | Status: AC
Start: 2023-02-06 — End: 2023-02-06
  Administered 2023-02-06: 1000 ug via INTRAMUSCULAR

## 2023-02-20 ENCOUNTER — Ambulatory Visit
Admission: RE | Admit: 2023-02-20 | Discharge: 2023-02-20 | Disposition: A | Discharge status: Home / Self Care | Payer: Medicare PPO | Attending: Family Medicine | Admitting: Family Medicine

## 2023-02-20 ENCOUNTER — Other Ambulatory Visit
Admission: RE | Admit: 2023-02-20 | Discharge: 2023-02-20 | Disposition: A | Discharge status: Home / Self Care | Payer: Medicare PPO | Source: Home / Self Care | Attending: Family Medicine | Admitting: Family Medicine

## 2023-02-20 ENCOUNTER — Ambulatory Visit: Payer: Medicare PPO | Admitting: Family Medicine

## 2023-02-20 ENCOUNTER — Ambulatory Visit
Admission: RE | Admit: 2023-02-20 | Discharge: 2023-02-20 | Disposition: A | Discharge status: Home / Self Care | Payer: Medicare PPO | Source: Ambulatory Visit | Attending: Family Medicine | Admitting: Family Medicine

## 2023-02-20 ENCOUNTER — Encounter: Payer: Self-pay | Admitting: Family Medicine

## 2023-02-20 VITALS — BP 102/68 | HR 68 | Temp 98.7°F | Resp 16 | Ht 65.0 in | Wt 102.0 lb

## 2023-02-20 DIAGNOSIS — R059 Cough, unspecified: Secondary | ICD-10-CM | POA: Diagnosis not present

## 2023-02-20 DIAGNOSIS — U071 COVID-19: Secondary | ICD-10-CM

## 2023-02-20 DIAGNOSIS — R051 Acute cough: Secondary | ICD-10-CM | POA: Insufficient documentation

## 2023-02-20 DIAGNOSIS — R918 Other nonspecific abnormal finding of lung field: Secondary | ICD-10-CM | POA: Diagnosis not present

## 2023-02-20 DIAGNOSIS — F5101 Primary insomnia: Secondary | ICD-10-CM

## 2023-02-20 LAB — RENAL FUNCTION PANEL
Albumin: 3.4 g/dL — ABNORMAL LOW (ref 3.5–5.0)
Anion gap: 11 (ref 5–15)
BUN: 21 mg/dL (ref 8–23)
CO2: 29 mmol/L (ref 22–32)
Calcium: 8.7 mg/dL — ABNORMAL LOW (ref 8.9–10.3)
Chloride: 94 mmol/L — ABNORMAL LOW (ref 98–111)
Creatinine, Ser: 0.78 mg/dL (ref 0.44–1.00)
GFR, Estimated: >60 mL/min (ref >60)
Glucose, Bld: 119 mg/dL — ABNORMAL HIGH (ref 70–99)
Phosphorus: 3.8 mg/dL (ref 2.5–4.6)
Potassium: 3.3 mmol/L — ABNORMAL LOW (ref 3.5–5.1)
Sodium: 134 mmol/L — ABNORMAL LOW (ref 135–145)

## 2023-02-20 LAB — POC COVID19/FLU A&B COMBO
Covid Antigen, POC: POSITIVE — AB
Influenza A Antigen, POC: NEGATIVE
Influenza B Antigen, POC: NEGATIVE

## 2023-02-20 MED ORDER — HYDROXYZINE HCL 10 MG PO TABS: 10.0000 mg | ORAL_TABLET | Freq: Three times a day (TID) | ORAL | 0 refills | Status: DC | PRN

## 2023-02-20 MED ORDER — NIRMATRELVIR/RITONAVIR (PAXLOVID)TABLET
3.0000 | ORAL_TABLET | Freq: Two times a day (BID) | ORAL | 0 refills | Status: DC
Start: 2023-02-20 — End: 2023-02-26

## 2023-02-20 MED ORDER — HYDROXYZINE HCL 10 MG PO TABS
10.0000 mg | ORAL_TABLET | Freq: Every evening | ORAL | 0 refills | Status: AC | PRN
Start: 2023-02-20 — End: ?

## 2023-02-20 NOTE — Progress Notes (Signed)
Date:  02/20/2023   Name:  Phyllis Ross   DOB:  02/23/1936   MRN:  [id number]   Chief Complaint: Cough (Started Friday-productive clear sputum, very weak and LOA. )  Cough This is a new problem. The current episode started in the past 7 days. The problem has been gradually worsening. The cough is Productive of sputum. Associated symptoms include nasal congestion, postnasal drip, a sore throat and shortness of breath. Pertinent negatives include no chest pain, chills, ear congestion, fever, headaches, hemoptysis, myalgias, rash, rhinorrhea, sweats or wheezing. Nothing aggravates the symptoms. She has tried OTC cough suppressant for the symptoms.    Lab Results  Component Value Date   NA 140 12/26/2022   K 4.6 12/26/2022   CO2 24 12/26/2022   GLUCOSE 83 12/26/2022   BUN 18 12/26/2022   CREATININE 0.95 12/26/2022   CALCIUM 9.4 12/26/2022   EGFR 59 (L) 12/26/2022   GFRNONAA >60 07/01/2022   Lab Results  Component Value Date   CHOL 125 10/10/2022   HDL 70 10/10/2022   LDLCALC 37 10/10/2022   TRIG 94 10/10/2022   CHOLHDL 2.7 10/07/2020   No results found for: "TSH" Lab Results  Component Value Date   HGBA1C 5.5 04/07/2020   Lab Results  Component Value Date   WBC 6.3 07/01/2022   HGB 11.6 (L) 07/01/2022   HCT 37.7 07/01/2022   MCV 70.1 (L) 07/01/2022   PLT 234 07/01/2022   Lab Results  Component Value Date   ALT 41 (H) 12/26/2022   AST 35 12/26/2022   GGT 12 12/26/2022   ALKPHOS 79 12/26/2022   BILITOT 1.1 12/26/2022   No results found for: "25OHVITD2", "25OHVITD3", "VD25OH"   Review of Systems  Constitutional:  Negative for chills and fever.  HENT:  Positive for postnasal drip and sore throat. Negative for rhinorrhea.   Respiratory:  Positive for cough and shortness of breath. Negative for hemoptysis and wheezing.   Cardiovascular:  Negative for chest pain.  Musculoskeletal:  Negative for myalgias.  Skin:  Negative for rash.  Neurological:  Negative  for headaches.    Patient Active Problem List   Diagnosis Date Noted   Primary osteoarthritis of right hip 07/24/2022   History of colon cancer    Polyp of transverse colon    B12 deficiency 09/16/2020   Goals of care, counseling/discussion 08/07/2020   Microcytic anemia 07/23/2020   Status post partial colectomy 07/02/2020   Protein-calorie malnutrition, severe 06/24/2020   Cecal cancer (HCC) 06/15/2020   Iron deficiency anemia due to chronic blood loss    Neoplasm of lower gastrointestinal tract    Acute gastritis with hemorrhage    Personal history of other malignant neoplasm of skin 01/21/2019   Atherosclerosis of abdominal aorta (HCC) 12/26/2017   Age-related osteoporosis without current pathological fracture 06/16/2016   Encounter for follow-up surveillance of ovarian cancer 05/20/2015   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   Wedging of vertebra (HCC) 10/13/2014   Polypharmacy 10/13/2014   Encounter for general adult medical examination without abnormal findings 10/13/2014   Anxiety 10/13/2014   Essential hypertension 08/27/2014   Bradycardia 08/27/2014   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) 08/27/2014   MI (mitral incompetence) 08/27/2014   TI (tricuspid incompetence) 08/27/2014   Combined fat and carbohydrate induced hyperlipemia 08/13/2014   HTN (hypertension), malignant 08/11/2014   Chest pain 08/11/2014   CAD (coronary artery disease) of artery bypass graft 08/11/2014    Allergies  Allergen Reactions   Trazodone And Nefazodone Other (See Comments)    Made her pass out    Past Surgical History:  Procedure Laterality Date   COLONOSCOPY  04/04/2009   normal- Dr Mechele Collin   COLONOSCOPY WITH PROPOFOL N/A 06/04/2020   Procedure: COLONOSCOPY WITH PROPOFOL;  Surgeon: Midge Minium, MD;  [facility];  Service: Endoscopy;  Laterality: N/A;   COLONOSCOPY WITH PROPOFOL N/A 01/31/2022    Procedure: COLONOSCOPY WITH PROPOFOL;  Surgeon: Midge Minium, MD;  Location: [facility];  Service: Endoscopy;  Laterality: N/A;   CORONARY ARTERY BYPASS GRAFT N/A 08/14/2006   Procedure: CORONARY ARTERY BYPASS GRAFT; Location: [facility]; Surgeon: Tilford Pillar, MD   CYSTOSCOPY WITH RETROGRADE PYELOGRAM, URETEROSCOPY AND STENT PLACEMENT Right 11/12/2021   Procedure: CYSTOSCOPY WITH RETROGRADE PYELOGRAM / RIGHT URETEROSCOPY/HOLMIUM LASER/RIGHT STENT EXCHANGE;  Surgeon: Sondra Come, MD;  Location: [facility];  Service: Urology;  Laterality: Right;   CYSTOSCOPY/URETEROSCOPY/HOLMIUM LASER/STENT PLACEMENT Right 10/29/2021   Procedure: CYSTOSCOPY/ RIGHT STENT PLACEMENT;  Surgeon: Sondra Come, MD;  Location: [facility];  Service: Urology;  Laterality: Right;   ESOPHAGOGASTRODUODENOSCOPY (EGD) WITH PROPOFOL N/A 06/04/2020   Procedure: ESOPHAGOGASTRODUODENOSCOPY (EGD) WITH PROPOFOL;  Surgeon: Midge Minium, MD;  [facility];  Service: Endoscopy;  Laterality: N/A;   FRACTURE SURGERY     ankle left   LEFT HEART CATH AND CORONARY ANGIOGRAPHY Left 08/14/2006   Procedure: LEFT HEART CATHETERIZATION AND CORONARY ANGIOGRAPHY; Location: [facility]: Surgeon: Rudean Hitt, MD   VAGINAL HYSTERECTOMY     DUB    Social History   Tobacco Use   Smoking status: Never    Passive exposure: Never   Smokeless tobacco: Never  Vaping Use   Vaping status: Never Used  Substance Use Topics   Alcohol use: No   Drug use: No     Medication list has been reviewed and updated.  Current Meds  Medication Sig   ALPRAZolam (XANAX) 0.25 MG tablet Take 1 tablet (0.25 mg total) by mouth daily as needed for anxiety.   atorvastatin (LIPITOR) 80 MG tablet TAKE (1) TABLET BY MOUTH EVERY DAY   Cyanocobalamin (VITAMIN B-12 IJ) Inject 1,000 vials as directed every 30 (thirty) days. Patient Not sure of dosage   cyanocobalamin (VITAMIN B12) 1000 MCG tablet Take 1 tablet (1,000 mcg total) by mouth daily.    dextromethorphan (DELSYM) 30 MG/5ML liquid Take by mouth as needed for cough.   ferrous sulfate 325 (65 FE) MG tablet Take 1 tablet (325 mg total) by mouth daily with breakfast.   fluorouracil (EFUDEX) 5 % cream Apply topically.   meloxicam (MOBIC) 7.5 MG tablet TAKE (1) TABLET BY MOUTH EVERY DAY   montelukast (SINGULAIR) 10 MG tablet TAKE (1) TABLET BY MOUTH DAILY AT BEDTIME   mupirocin ointment (BACTROBAN) 2 % Apply 1 Application topically 2 (two) times daily.   Nutritional Supplements (ENSURE COMPLETE PO) Take 1 Can by mouth daily.   potassium chloride SA (KLOR-CON M) 20 MEQ tablet TAKE (1) TABLET BY MOUTH EVERY DAY       10/10/2022    9:21 AM 10/10/2022    9:19 AM 08/22/2022    4:24 PM 04/01/2022    2:35 PM  GAD 7 : Generalized Anxiety Score  Nervous, Anxious, on Edge 0 0 0 2  Control/stop worrying 0 0 0 2  Worry too much - different things 0 0 0 2  Trouble relaxing 0 0 0 0  Restless 0 0 0 0  Easily annoyed or irritable 0  0 0 1  Afraid - awful might happen 0 0 0 1  Total GAD 7 Score 0 0 0 8  Anxiety Difficulty Not difficult at all Not difficult at all Not difficult at all Somewhat difficult       02/20/2023    1:44 PM 10/10/2022    9:21 AM 10/10/2022    9:19 AM  Depression screen PHQ 2/9  Decreased Interest 0 0 0  Down, Depressed, Hopeless 0 0 0  PHQ - 2 Score 0 0 0  Altered sleeping  0 0  Tired, decreased energy  0 0  Change in appetite  0 0  Feeling bad or failure about yourself   0 0  Trouble concentrating  0 0  Moving slowly or fidgety/restless  0 0  Suicidal thoughts  0 0  PHQ-9 Score  0 0  Difficult doing work/chores  Not difficult at all Not difficult at all    BP Readings from Last 3 Encounters:  02/20/23 102/68  12/26/22 139/81  10/14/22 128/72    Physical Exam Vitals and nursing note reviewed. Exam conducted with a chaperone present.  Constitutional:      General: She is not in acute distress.    Appearance: She is not diaphoretic.  HENT:     Head:  Normocephalic and atraumatic.     Right Ear: Tympanic membrane and external ear normal.     Left Ear: Tympanic membrane and external ear normal.     Nose: Nose normal.  Eyes:     General:        Right eye: No discharge.        Left eye: No discharge.     Conjunctiva/sclera: Conjunctivae normal.     Pupils: Pupils are equal, round, and reactive to light.  Neck:     Thyroid: No thyromegaly.     Vascular: No JVD.  Cardiovascular:     Rate and Rhythm: Normal rate and regular rhythm.     Heart sounds: Normal heart sounds. No murmur heard.    No friction rub. No gallop.  Pulmonary:     Effort: Pulmonary effort is normal.     Breath sounds: Normal breath sounds. No wheezing, rhonchi or rales.  Abdominal:     General: Bowel sounds are normal.     Palpations: Abdomen is soft. There is no mass.     Tenderness: There is no abdominal tenderness. There is no guarding.  Musculoskeletal:     Cervical back: Normal range of motion and neck supple.  Lymphadenopathy:     Cervical: No cervical adenopathy.  Skin:    General: Skin is warm.  Neurological:     Mental Status: She is alert.     Deep Tendon Reflexes: Reflexes are normal and symmetric.     Wt Readings from Last 3 Encounters:  02/20/23 102 lb (46.3 kg)  12/26/22 102 lb 2 oz (46.3 kg)  10/25/22 103 lb (46.7 kg)    BP 102/68   Pulse 68   Temp 98.7 F (37.1 C) (Oral)   Resp 16   Ht 5\' 5"  (1.651 m)   Wt 102 lb (46.3 kg)   SpO2 96%   BMI 16.97 kg/m   Assessment and Plan: 1. Acute cough Patient with nonproductive cough since Thursday has progressively decrease in stamina.  Cough is not controlled with DayQuil.  We would obtain COVID test and influenza test. - DG Chest 2 View - Renal Function Panel - POC Covid19/Flu A&B Antigen  2.  COVID COVID is positive with the influenza negative we will proceed with initiating Paxlovid 3 tablets twice a day for 5 days and that her GFR is greater than 60. - nirmatrelvir/ritonavir  (PAXLOVID) 20 x 150 MG & 10 x 100MG  TABS; Take 3 tablets by mouth 2 (two) times daily for 5 days. (Take nirmatrelvir 150 mg two tablets twice daily for 5 days and ritonavir 100 mg one tablet twice daily for 5 days) Patient GFR is 60  Dispense: 30 tablet; Refill: 0  3. Primary insomnia Patient's had increasing difficulty sleeping and has not been able to tolerate trazodone in the past we will initiate 10 mg hydroxyzine to be taken at night.     Elizabeth Sauer, MD

## 2023-02-21 ENCOUNTER — Other Ambulatory Visit: Payer: Self-pay | Admitting: Family Medicine

## 2023-02-21 ENCOUNTER — Encounter: Payer: Self-pay | Admitting: Family Medicine

## 2023-02-21 MED ORDER — AZITHROMYCIN 250 MG PO TABS: ORAL_TABLET | ORAL | 0 refills | Status: DC

## 2023-02-23 ENCOUNTER — Encounter: Payer: Self-pay | Admitting: Emergency Medicine

## 2023-02-23 ENCOUNTER — Emergency Department: Payer: Medicare PPO

## 2023-02-23 ENCOUNTER — Observation Stay
Admission: EM | Admit: 2023-02-23 | Discharge: 2023-02-26 | Disposition: A | Discharge status: Home Health | Payer: Medicare PPO | Attending: Student | Admitting: Student

## 2023-02-23 ENCOUNTER — Other Ambulatory Visit: Payer: Self-pay

## 2023-02-23 DIAGNOSIS — Y92000 Kitchen of unspecified non-institutional (private) residence as  the place of occurrence of the external cause: Secondary | ICD-10-CM | POA: Insufficient documentation

## 2023-02-23 DIAGNOSIS — Z85038 Personal history of other malignant neoplasm of large intestine: Secondary | ICD-10-CM | POA: Insufficient documentation

## 2023-02-23 DIAGNOSIS — R262 Difficulty in walking, not elsewhere classified: Secondary | ICD-10-CM | POA: Insufficient documentation

## 2023-02-23 DIAGNOSIS — R531 Weakness: Secondary | ICD-10-CM

## 2023-02-23 DIAGNOSIS — W19XXXA Unspecified fall, initial encounter: Secondary | ICD-10-CM | POA: Diagnosis not present

## 2023-02-23 DIAGNOSIS — Z79899 Other long term (current) drug therapy: Secondary | ICD-10-CM | POA: Diagnosis not present

## 2023-02-23 DIAGNOSIS — Z951 Presence of aortocoronary bypass graft: Secondary | ICD-10-CM | POA: Diagnosis not present

## 2023-02-23 DIAGNOSIS — U071 COVID-19: Secondary | ICD-10-CM | POA: Diagnosis not present

## 2023-02-23 DIAGNOSIS — Z85828 Personal history of other malignant neoplasm of skin: Secondary | ICD-10-CM | POA: Diagnosis not present

## 2023-02-23 DIAGNOSIS — W01198A Fall on same level from slipping, tripping and stumbling with subsequent striking against other object, initial encounter: Secondary | ICD-10-CM | POA: Insufficient documentation

## 2023-02-23 DIAGNOSIS — E559 Vitamin D deficiency, unspecified: Secondary | ICD-10-CM | POA: Diagnosis not present

## 2023-02-23 DIAGNOSIS — E785 Hyperlipidemia, unspecified: Secondary | ICD-10-CM | POA: Diagnosis not present

## 2023-02-23 DIAGNOSIS — E876 Hypokalemia: Secondary | ICD-10-CM | POA: Diagnosis not present

## 2023-02-23 DIAGNOSIS — I129 Hypertensive chronic kidney disease with stage 1 through stage 4 chronic kidney disease, or unspecified chronic kidney disease: Secondary | ICD-10-CM | POA: Diagnosis not present

## 2023-02-23 DIAGNOSIS — I10 Essential (primary) hypertension: Secondary | ICD-10-CM | POA: Diagnosis not present

## 2023-02-23 DIAGNOSIS — R001 Bradycardia, unspecified: Secondary | ICD-10-CM | POA: Diagnosis not present

## 2023-02-23 DIAGNOSIS — I2510 Atherosclerotic heart disease of native coronary artery without angina pectoris: Secondary | ICD-10-CM | POA: Insufficient documentation

## 2023-02-23 DIAGNOSIS — E440 Moderate protein-calorie malnutrition: Secondary | ICD-10-CM | POA: Diagnosis not present

## 2023-02-23 DIAGNOSIS — N189 Chronic kidney disease, unspecified: Secondary | ICD-10-CM | POA: Insufficient documentation

## 2023-02-23 DIAGNOSIS — S0990XA Unspecified injury of head, initial encounter: Secondary | ICD-10-CM | POA: Diagnosis not present

## 2023-02-23 DIAGNOSIS — G238 Other specified degenerative diseases of basal ganglia: Secondary | ICD-10-CM | POA: Diagnosis not present

## 2023-02-23 DIAGNOSIS — Z8543 Personal history of malignant neoplasm of ovary: Secondary | ICD-10-CM | POA: Diagnosis not present

## 2023-02-23 DIAGNOSIS — R5383 Other fatigue: Secondary | ICD-10-CM | POA: Diagnosis present

## 2023-02-23 LAB — URINALYSIS, W/ REFLEX TO CULTURE (INFECTION SUSPECTED)
Bilirubin Urine: NEGATIVE
Glucose, UA: NEGATIVE mg/dL
Hgb urine dipstick: NEGATIVE
Ketones, ur: NEGATIVE mg/dL
Leukocytes,Ua: NEGATIVE
Nitrite: NEGATIVE
Protein, ur: NEGATIVE mg/dL
Specific Gravity, Urine: 1.008 (ref 1.005–1.030)
pH: 6 (ref 5.0–8.0)

## 2023-02-23 LAB — CBC WITH DIFFERENTIAL/PLATELET
Abs Immature Granulocytes: 0.04 10*3/uL (ref 0.00–0.07)
Basophils Absolute: 0 10*3/uL (ref 0.0–0.1)
Basophils Relative: 0 %
Eosinophils Absolute: 0 10*3/uL (ref 0.0–0.5)
Eosinophils Relative: 0 %
HCT: 34.3 % — ABNORMAL LOW (ref 36.0–46.0)
Hemoglobin: 11.2 g/dL — ABNORMAL LOW (ref 12.0–15.0)
Immature Granulocytes: 0 %
Lymphocytes Relative: 10 %
Lymphs Abs: 0.9 10*3/uL (ref 0.7–4.0)
MCH: 21.7 pg — ABNORMAL LOW (ref 26.0–34.0)
MCHC: 32.7 g/dL (ref 30.0–36.0)
MCV: 66.5 fL — ABNORMAL LOW (ref 80.0–100.0)
Monocytes Absolute: 0.7 10*3/uL (ref 0.1–1.0)
Monocytes Relative: 8 %
Neutro Abs: 7.2 10*3/uL (ref 1.7–7.7)
Neutrophils Relative %: 82 %
Platelets: 167 10*3/uL (ref 150–400)
RBC: 5.16 MIL/uL — ABNORMAL HIGH (ref 3.87–5.11)
RDW: 14.6 % (ref 11.5–15.5)
Smear Review: NORMAL
WBC: 8.9 10*3/uL (ref 4.0–10.5)
nRBC: 0 % (ref 0.0–0.2)

## 2023-02-23 LAB — BASIC METABOLIC PANEL
Anion gap: 12 (ref 5–15)
BUN: 16 mg/dL (ref 8–23)
CO2: 27 mmol/L (ref 22–32)
Calcium: 8.6 mg/dL — ABNORMAL LOW (ref 8.9–10.3)
Chloride: 93 mmol/L — ABNORMAL LOW (ref 98–111)
Creatinine, Ser: 0.78 mg/dL (ref 0.44–1.00)
GFR, Estimated: >60 mL/min (ref >60)
Glucose, Bld: 107 mg/dL — ABNORMAL HIGH (ref 70–99)
Potassium: 2.8 mmol/L — ABNORMAL LOW (ref 3.5–5.1)
Sodium: 132 mmol/L — ABNORMAL LOW (ref 135–145)

## 2023-02-23 LAB — MAGNESIUM: Magnesium: 1.3 mg/dL — ABNORMAL LOW (ref 1.7–2.4)

## 2023-02-23 LAB — POTASSIUM: Potassium: 3.8 mmol/L (ref 3.5–5.1)

## 2023-02-23 LAB — TSH: TSH: 2.114 u[IU]/mL (ref 0.350–4.500)

## 2023-02-23 MED ORDER — MUPIROCIN 2 % EX OINT: 1.0000 | TOPICAL_OINTMENT | Freq: Two times a day (BID) | CUTANEOUS | Status: DC

## 2023-02-23 MED ORDER — ENSURE COMPLETE PO LIQD
237.0000 mL | Freq: Three times a day (TID) | ORAL | Status: DC
  Filled 2023-02-23: qty 237

## 2023-02-23 MED ORDER — FERROUS SULFATE 325 (65 FE) MG PO TABS
325.0000 mg | ORAL_TABLET | Freq: Every day | ORAL | Status: DC
  Administered 2023-02-24 – 2023-02-26 (×3): 325 mg via ORAL
  Filled 2023-02-23 (×3): qty 1

## 2023-02-23 MED ORDER — FLUOROURACIL 5 % EX CREA: TOPICAL_CREAM | Freq: Two times a day (BID) | CUTANEOUS | Status: DC

## 2023-02-23 MED ORDER — LOPERAMIDE HCL 2 MG PO CAPS: 2.0000 mg | ORAL_CAPSULE | ORAL | Status: DC | PRN

## 2023-02-23 MED ORDER — ONDANSETRON HCL 4 MG/2ML IJ SOLN
4.0000 mg | Freq: Once | INTRAMUSCULAR | Status: AC
  Administered 2023-02-23: 4 mg via INTRAVENOUS
  Filled 2023-02-23: qty 2

## 2023-02-23 MED ORDER — GUAIFENESIN ER 600 MG PO TB12
600.0000 mg | ORAL_TABLET | Freq: Two times a day (BID) | ORAL | Status: DC
  Administered 2023-02-23 – 2023-02-26 (×6): 600 mg via ORAL
  Filled 2023-02-23 (×6): qty 1

## 2023-02-23 MED ORDER — IPRATROPIUM-ALBUTEROL 0.5-2.5 (3) MG/3ML IN SOLN
3.0000 mL | Freq: Four times a day (QID) | RESPIRATORY_TRACT | Status: DC
  Administered 2023-02-23 (×2): 3 mL via RESPIRATORY_TRACT
  Filled 2023-02-23 (×2): qty 3

## 2023-02-23 MED ORDER — SODIUM CHLORIDE 0.9 % IV BOLUS
1000.0000 mL | Freq: Once | INTRAVENOUS | Status: AC
  Administered 2023-02-23: 1000 mL via INTRAVENOUS

## 2023-02-23 MED ORDER — POTASSIUM CHLORIDE CRYS ER 20 MEQ PO TBCR
40.0000 meq | EXTENDED_RELEASE_TABLET | Freq: Once | ORAL | Status: AC
  Administered 2023-02-23: 40 meq via ORAL
  Filled 2023-02-23: qty 2

## 2023-02-23 MED ORDER — POTASSIUM CHLORIDE 20 MEQ PO PACK
60.0000 meq | PACK | ORAL | Status: AC
  Administered 2023-02-23: 60 meq via ORAL
  Filled 2023-02-23: qty 3

## 2023-02-23 MED ORDER — MONTELUKAST SODIUM 10 MG PO TABS
10.0000 mg | ORAL_TABLET | Freq: Every day | ORAL | Status: DC
  Administered 2023-02-23 – 2023-02-25 (×3): 10 mg via ORAL
  Filled 2023-02-23 (×3): qty 1

## 2023-02-23 MED ORDER — ATORVASTATIN CALCIUM 20 MG PO TABS
80.0000 mg | ORAL_TABLET | Freq: Every day | ORAL | Status: DC
  Administered 2023-02-23: 80 mg via ORAL
  Filled 2023-02-23: qty 4

## 2023-02-23 MED ORDER — ENOXAPARIN SODIUM 40 MG/0.4ML IJ SOSY
40.0000 mg | PREFILLED_SYRINGE | INTRAMUSCULAR | Status: DC
  Administered 2023-02-23 – 2023-02-24 (×2): 40 mg via SUBCUTANEOUS
  Filled 2023-02-23 (×2): qty 0.4

## 2023-02-23 MED ORDER — BENZONATATE 100 MG PO CAPS
100.0000 mg | ORAL_CAPSULE | Freq: Three times a day (TID) | ORAL | Status: DC | PRN
  Filled 2023-02-23: qty 1

## 2023-02-23 MED ORDER — ALPRAZOLAM 0.25 MG PO TABS: 0.2500 mg | ORAL_TABLET | Freq: Every day | ORAL | Status: DC | PRN

## 2023-02-23 MED ORDER — ONDANSETRON HCL 4 MG PO TABS: 4.0000 mg | ORAL_TABLET | Freq: Four times a day (QID) | ORAL | Status: DC | PRN

## 2023-02-23 MED ORDER — IPRATROPIUM-ALBUTEROL 20-100 MCG/ACT IN AERS
1.0000 | INHALATION_SPRAY | Freq: Four times a day (QID) | RESPIRATORY_TRACT | Status: DC
  Administered 2023-02-24 – 2023-02-26 (×8): 1 via RESPIRATORY_TRACT
  Filled 2023-02-23: qty 4

## 2023-02-23 MED ORDER — AZITHROMYCIN 500 MG PO TABS
250.0000 mg | ORAL_TABLET | Freq: Every day | ORAL | Status: AC
  Administered 2023-02-24 – 2023-02-25 (×2): 250 mg via ORAL
  Filled 2023-02-23 (×2): qty 1

## 2023-02-23 MED ORDER — ACETAMINOPHEN 650 MG RE SUPP
650.0000 mg | Freq: Four times a day (QID) | RECTAL | Status: DC | PRN
Start: 2023-02-23 — End: 2023-02-26

## 2023-02-23 MED ORDER — NIRMATRELVIR/RITONAVIR (PAXLOVID) TABLET (RENAL DOSING)
2.0000 | ORAL_TABLET | Freq: Two times a day (BID) | ORAL | Status: AC
  Administered 2023-02-23 – 2023-02-25 (×4): 2 via ORAL
  Filled 2023-02-23: qty 20

## 2023-02-23 MED ORDER — MELOXICAM 7.5 MG PO TABS
7.5000 mg | ORAL_TABLET | Freq: Every day | ORAL | Status: DC
  Administered 2023-02-24 – 2023-02-26 (×3): 7.5 mg via ORAL
  Filled 2023-02-23 (×3): qty 1

## 2023-02-23 MED ORDER — ONDANSETRON HCL 4 MG/2ML IJ SOLN
4.0000 mg | Freq: Four times a day (QID) | INTRAMUSCULAR | Status: DC | PRN
  Administered 2023-02-24: 4 mg via INTRAVENOUS
  Filled 2023-02-23: qty 2

## 2023-02-23 MED ORDER — ACETAMINOPHEN 325 MG PO TABS
650.0000 mg | ORAL_TABLET | Freq: Four times a day (QID) | ORAL | Status: DC | PRN
  Filled 2023-02-23: qty 2

## 2023-02-23 NOTE — ED Notes (Signed)
Up to bathroom. Family at bedside

## 2023-02-23 NOTE — ED Notes (Signed)
Called [facility] to notify of patient transfer

## 2023-02-23 NOTE — H&P (Signed)
History and Physical    Phyllis Ross YNW:[id number] DOB: [birth year] DOA: 02/23/2023  PCP: Duanne Limerick, MD (Confirm with patient/family/NH records and if not entered, this has to be entered at [facility] point of entry) Patient coming from: Home  I have personally briefly reviewed patient's old medical records in [facility]  Chief Complaint: Feeling weak  HPI: Phyllis Ross is a 87 y.o. female with medical history significant of cecum CA status post right hemicolectomy in 2022, HLD, CAD CABG in 2008, presented with persistent diarrhea and generalized weakness and frequent falls.  Symptoms started last Sunday, patient started develop productive cough with whitish phlegm production, no wheezing no fever chills.  Went to see PCP on Monday, who did a office COVID study which turned positive.  Patient was started on Paxlovid and azithromycin.  Monday evening, patient started to have loose bowel movement 4-5 times a day every day for the last 3+ days, no abdominal pain, no nauseous vomiting.  She also has loss of taste during the time.  Last few days, patient developed worsening of generalized weakness and sustained several falls at home, but denied any LOC no head or neck injuries.  ED Course: Afebrile, nontachycardic, not hypotensive.  O2 saturation 93% on room air.  Chest x-ray showed no acute infiltrates.  Blood work showed K2.8, WBC 8.9, hemoglobin 11.2, creatinine 0.7 bicarb 27.  Review of Systems: As per HPI otherwise 14 point review of systems negative.    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   a.) stage IIIc (G2, (+) LVI, (-) PNI, T3N3BMX) --> s/p RIGHT hemicolectomy + oral capecitabine   Anemia    Anxiety    a.) on BZO (alprazolam) PRN   Aortic atherosclerosis (HCC)    Arthritis    Beta thalassemia (HCC)    Bradycardia    CAD (coronary artery disease) 08/14/2006   a.) STEMI 08/14/2006 --> LHC at [facility] --> EF 45%, 95% mLAD, 95%  OM1, and 75% LV groove artery --> transfer to [facility] for CVTS consult; b.) 3v CABG 08/14/2006   Chronic kidney disease    History of kidney stones    Hyperlipidemia    Hypertension    Osteoporosis    Ovarian abscess    Ovarian cancer (HCC) 2005   a.) stage IIIb adenocarcinoma --> s/p CRS 2005 + 6 cycles of carboplatin/paclitaxel   Pneumonia    as a baby   Rheumatic fever    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   a.) LIMA-LAD, SVG-RI, SVG-OM1   Skin cancer    STEMI (ST elevation myocardial infarction) (HCC) 08/14/2006   a.) LHC at [facility] --> transferred to [facility] for emergent CABG    Past Surgical History:  Procedure Laterality Date   COLONOSCOPY  04/04/2009   normal- Dr Mechele Collin   COLONOSCOPY WITH PROPOFOL N/A 06/04/2020   Procedure: COLONOSCOPY WITH PROPOFOL;  Surgeon: Midge Minium, MD;  Location: [facility];  Service: Endoscopy;  Laterality: N/A;   COLONOSCOPY WITH PROPOFOL N/A 01/31/2022   Procedure: COLONOSCOPY WITH PROPOFOL;  Surgeon: Midge Minium, MD;  Location: [facility];  Service: Endoscopy;  Laterality: N/A;   CORONARY ARTERY BYPASS GRAFT N/A 08/14/2006   Procedure: CORONARY ARTERY BYPASS GRAFT; Location: [facility]; Surgeon: Tilford Pillar, MD   CYSTOSCOPY WITH RETROGRADE PYELOGRAM, URETEROSCOPY AND STENT PLACEMENT Right 11/12/2021   Procedure: CYSTOSCOPY WITH RETROGRADE PYELOGRAM / RIGHT URETEROSCOPY/HOLMIUM LASER/RIGHT STENT EXCHANGE;  Surgeon: Sondra Come, MD;  Location: [facility] ORS;  Service: Urology;  Laterality: Right;   CYSTOSCOPY/URETEROSCOPY/HOLMIUM LASER/STENT PLACEMENT Right 10/29/2021   Procedure: CYSTOSCOPY/ RIGHT STENT PLACEMENT;  Surgeon: Sondra Come, MD;  Location: [facility] OR[facility];  Service: Urology;  Laterality: Right;   ESOPHAGOGASTRODUODENOSCOPY (EGD) WITH PROPOFOL N/A 06/04/2020   Procedure: ESOPHAGOGASTRODUODENOSCOPY (EGD) WITH PROPOFOL;  Surgeon: Midge Minium, MD;  Location: [facility];  Service: Endoscopy;  Laterality: N/A;   FRACTURE SURGERY      ankle left   LEFT HEART CATH AND CORONARY ANGIOGRAPHY Left 08/14/2006   Procedure: LEFT HEART CATHETERIZATION AND CORONARY ANGIOGRAPHY; Location: [facility]: Surgeon: Rudean Hitt, MD   VAGINAL HYSTERECTOMY     DUB     reports that she has never smoked. She has never been exposed to tobacco smoke. She has never used smokeless tobacco. She reports that she does not drink alcohol and does not use drugs.  Allergies  Allergen Reactions   Trazodone And Nefazodone Other (See Comments)    Made her pass out    Family History  Problem Relation Age of Onset   Heart attack Mother    Heart attack Maternal Uncle    Heart attack Maternal Uncle      Prior to Admission medications   Medication Sig Start Date End Date Taking? Authorizing Provider  ALPRAZolam (XANAX) 0.25 MG tablet Take 1 tablet (0.25 mg total) by mouth daily as needed for anxiety. 04/01/22   Duanne Limerick, MD  atorvastatin (LIPITOR) 80 MG tablet TAKE (1) TABLET BY MOUTH EVERY DAY 10/10/22   Duanne Limerick, MD  azithromycin (ZITHROMAX) 250 MG tablet Take 2 tablets on day 1, then 1 tablet daily on days 2 through 5 02/21/23 02/26/23  Duanne Limerick, MD  Cyanocobalamin (VITAMIN B-12 IJ) Inject 1,000 vials as directed every 30 (thirty) days. Patient Not sure of dosage    [provider]  cyanocobalamin (VITAMIN B12) 1000 MCG tablet Take 1 tablet (1,000 mcg total) by mouth daily. 10/10/22   Duanne Limerick, MD  dextromethorphan (DELSYM) 30 MG/5ML liquid Take by mouth as needed for cough.    [provider]  ferrous sulfate 325 (65 FE) MG tablet Take 1 tablet (325 mg total) by mouth daily with breakfast. 10/10/22   Duanne Limerick, MD  fluorouracil (EFUDEX) 5 % cream Apply topically. 01/20/22   [provider]  hydrOXYzine (ATARAX) 10 MG tablet Take 1 tablet (10 mg total) by mouth at bedtime as needed. 02/20/23   Duanne Limerick, MD  meloxicam (MOBIC) 7.5 MG tablet TAKE (1) TABLET BY MOUTH EVERY DAY 01/12/23   Duanne Limerick, MD  montelukast (SINGULAIR) 10 MG tablet TAKE (1) TABLET BY MOUTH DAILY AT BEDTIME 10/10/22   Duanne Limerick, MD  mupirocin ointment (BACTROBAN) 2 % Apply 1 Application topically 2 (two) times daily. 08/22/22   Duanne Limerick, MD  nirmatrelvir/ritonavir (PAXLOVID) 20 x 150 MG & 10 x 100MG  TABS Take 3 tablets by mouth 2 (two) times daily for 5 days. (Take nirmatrelvir 150 mg two tablets twice daily for 5 days and ritonavir 100 mg one tablet twice daily for 5 days) Patient GFR is 60 02/20/23 02/25/23  Duanne Limerick, MD  Nutritional Supplements (ENSURE COMPLETE PO) Take 1 Can by mouth daily.    [provider]  potassium chloride SA (KLOR-CON M) 20 MEQ tablet TAKE (1) TABLET BY MOUTH EVERY DAY 10/10/22   Duanne Limerick, MD    Physical Exam: Vitals:   02/23/23 1304 02/23/23 1312  BP: Marland Kitchen)  166/66   Pulse: 60   Resp: 16   Temp: 98.7 F (37.1 C)   TempSrc: Oral   SpO2: 93%   Weight:  46.2 kg  Height:  5\' 5"  (1.651 m)    Constitutional: NAD, calm, comfortable Vitals:   02/23/23 1304 02/23/23 1312  BP: (!) 166/66   Pulse: 60   Resp: 16   Temp: 98.7 F (37.1 C)   TempSrc: Oral   SpO2: 93%   Weight:  46.2 kg  Height:  5\' 5"  (1.651 m)   Eyes: PERRL, lids and conjunctivae normal ENMT: Mucous membranes are moist. Posterior pharynx clear of any exudate or lesions.Normal dentition.  Neck: normal, supple, no masses, no thyromegaly Respiratory: clear to auscultation bilaterally, no wheezing, no crackles. Normal respiratory effort. No accessory muscle use.  Cardiovascular: Regular rate and rhythm, no murmurs / rubs / gallops. No extremity edema. 2+ pedal pulses. No carotid bruits.  Abdomen: no tenderness, no masses palpated. No hepatosplenomegaly. Bowel sounds positive.  Musculoskeletal: no clubbing / cyanosis. No joint deformity upper and lower extremities. Good ROM, no contractures. Normal muscle tone.  Skin: no rashes, lesions, ulcers. No induration Neurologic: CN 2-12  grossly intact. Sensation intact, DTR normal. Strength 5/5 in all 4.  Psychiatric: Normal judgment and insight. Alert and oriented x 3. Normal mood.     Labs on Admission: I have personally reviewed following labs and imaging studies  CBC: Recent Labs  Lab 02/23/23 1345  WBC 8.9  NEUTROABS 7.2  HGB 11.2*  HCT 34.3*  MCV 66.5*  PLT 167   Basic Metabolic Panel: Recent Labs  Lab 02/20/23 1514 02/23/23 1345  NA 134* 132*  K 3.3* 2.8*  CL 94* 93*  CO2 29 27  GLUCOSE 119* 107*  BUN 21 16  CREATININE 0.78 0.78  CALCIUM 8.7* 8.6*  PHOS 3.8  --    GFR: Estimated Creatinine Clearance: 37.5 mL/min (by C-G formula based on SCr of 0.78 mg/dL). Liver Function Tests: Recent Labs  Lab 02/20/23 1514  ALBUMIN 3.4*   No results for input(s): "LIPASE", "AMYLASE" in the last 168 hours. No results for input(s): "AMMONIA" in the last 168 hours. Coagulation Profile: No results for input(s): "INR", "PROTIME" in the last 168 hours. Cardiac Enzymes: No results for input(s): "CKTOTAL", "CKMB", "CKMBINDEX", "TROPONINI" in the last 168 hours. BNP (last 3 results) No results for input(s): "PROBNP" in the last 8760 hours. HbA1C: No results for input(s): "HGBA1C" in the last 72 hours. CBG: No results for input(s): "GLUCAP" in the last 168 hours. Lipid Profile: No results for input(s): "CHOL", "HDL", "LDLCALC", "TRIG", "CHOLHDL", "LDLDIRECT" in the last 72 hours. Thyroid Function Tests: No results for input(s): "TSH", "T4TOTAL", "FREET4", "T3FREE", "THYROIDAB" in the last 72 hours. Anemia Panel: No results for input(s): "VITAMINB12", "FOLATE", "FERRITIN", "TIBC", "IRON", "RETICCTPCT" in the last 72 hours. Urine analysis:    Component Value Date/Time   COLORURINE YELLOW (A) 02/23/2023 1454   APPEARANCEUR CLEAR (A) 02/23/2023 1454   APPEARANCEUR Cloudy (A) 10/28/2021 1306   LABSPEC 1.008 02/23/2023 1454   PHURINE 6.0 02/23/2023 1454   GLUCOSEU NEGATIVE 02/23/2023 1454   HGBUR NEGATIVE  02/23/2023 1454   BILIRUBINUR NEGATIVE 02/23/2023 1454   BILIRUBINUR negative 01/17/2022 1558   BILIRUBINUR Negative 10/28/2021 1306   KETONESUR NEGATIVE 02/23/2023 1454   PROTEINUR NEGATIVE 02/23/2023 1454   UROBILINOGEN 0.2 01/17/2022 1558   NITRITE NEGATIVE 02/23/2023 1454   LEUKOCYTESUR NEGATIVE 02/23/2023 1454    Radiological Exams on Admission: CT Head Wo Contrast  Result Date: 02/23/2023 CLINICAL DATA:  Head trauma, minor (Age >= 65y) EXAM: CT HEAD WITHOUT CONTRAST TECHNIQUE: Contiguous axial images were obtained from the base of the skull through the vertex without intravenous contrast. RADIATION DOSE REDUCTION: This exam was performed according to the departmental dose-optimization program which includes automated exposure control, adjustment of the mA and/or kV according to patient size and/or use of iterative reconstruction technique. COMPARISON:  None Available. FINDINGS: Brain: No hemorrhage. No hydrocephalus. No extra-axial fluid collection. No CT evidence of an acute cortical infarct. No mass effect. No mass lesion. There is mineralization of the basal ganglia bilaterally. Vascular: No hyperdense vessel or unexpected calcification. Skull: Normal. Negative for fracture or focal lesion. Sinuses/Orbits: No middle ear or mastoid effusion. Paranasal sinuses are clear. Orbits are notable bilateral lens replacement, otherwise unremarkable. Other: None. IMPRESSION: No CT evidence of intracranial injury. Electronically Signed   By: Lorenza Cambridge M.D.   On: 02/23/2023 13:53    EKG: None  Assessment/Plan Principal Problem:   Hypokalemia Active Problems:   COVID-19 virus infection  (please populate well all problems here in Problem List. (For example, if patient is on BP meds at home and you resume or decide to hold them, it is a problem that needs to be her. Same for CAD, COPD, HLD and so on)  Severe hypokalemia -Secondary to acute GI loss from persistent diarrhea likely secondary to  COVID -P.o. replacement x 2, recheck level tonight.  Magnesium level sent  COVID infection Loss of taste Loose diarrhea -Continue Paxlovid x 2 days to complete a 5 days session. -Trial of Imodium for diarrhea  Moderate protein caloric malnutrition -Lost about 20 pounds after colon surgery 2022 and never came back. -Continue Ensure supplement -Consult dietitian  HTN CAD HLD -No acute concern, continue statin.  DVT prophylaxis: Lovenox Code Status: Full code Family Communication: Son and daughter at bedside Disposition Plan: Expect less than 2 midnight hospital stay Consults called: none Admission status: Tele obs   Emeline General MD [facility] Pager [phone]  02/23/2023, 4:28 PM

## 2023-02-23 NOTE — Progress Notes (Addendum)
PHARMACIST - PHYSICIAN COMMUNICATION  CONCERNING:  Enoxaparin (Lovenox) for DVT Prophylaxis   ASSESSMENT: Patient was prescribed enoxaparin 30 mg subcutaneously every 24 hours for VTE prophylaxis.   Body mass index is 16.95 kg/m.  Estimated Creatinine Clearance: 37.5 mL/min (by C-G formula based on SCr of 0.78 mg/dL).  Based on [facility] policy, patient qualifies for enoxaparin dosing of 40 mg every 24 hours because their creatinine clearance is >30 mL/min. and TBW is >45 kg  PLAN: Pharmacy has adjusted enoxaparin dose per [facility] policy.  Description: Patient is now receiving enoxaparin 40 mg subcutaneously every 24 hours.  Will M. Dareen Piano, PharmD Clinical Pharmacist 02/23/2023 6:29 PM

## 2023-02-23 NOTE — ED Provider Notes (Signed)
[facility] Provider Note    Event Date/Time   First MD Initiated Contact with Patient 02/23/23 1306     (approximate)   History   Chief Complaint: Fall   HPI  Phyllis Ross is a 87 y.o. female with a history of hypertension, CKD who comes to the ED due to a fall at home.  Reports that she has felt fatigued for the past week with loss of appetite, was diagnosed with COVID 3 days ago.  Review of outside records shows that she was started on Paxlovid.  Patient reports poor oral intake and worsening fatigue.  Today while in her kitchen she lost her balance and fell, hitting her head on a cabinet door and landing on her right hip.  Denies any specific pain right now, no chest pain or shortness of breath or cough or fever.  Denies lightheadedness or syncope. No vomiting or diarrhea  Children at bedside report that they called her PCP Dr. Yetta Barre and were instructed to take her to ED due to severity of symptoms.         Physical Exam   Triage Vital Signs: ED Triage Vitals  Encounter Vitals Group     BP 02/23/23 1304 (!) 166/66     Systolic BP Percentile --      Diastolic BP Percentile --      Pulse Rate 02/23/23 1304 60     Resp 02/23/23 1304 16     Temp 02/23/23 1304 98.7 F (37.1 C)     Temp Source 02/23/23 1304 Oral     SpO2 02/23/23 1304 93 %     Weight 02/23/23 1312 101 lb 13.6 oz (46.2 kg)     Height 02/23/23 1312 5\' 5"  (1.651 m)     Head Circumference --      Peak Flow --      Pain Score 02/23/23 1311 2     Pain Loc --      Pain Education --      Exclude from Growth Chart --     Most recent vital signs: Vitals:   02/23/23 1304  BP: (!) 166/66  Pulse: 60  Resp: 16  Temp: 98.7 F (37.1 C)  SpO2: 93%    General: Awake, no distress.  CV:  Good peripheral perfusion.  Regular rate and rhythm Resp:  Normal effort.  Clear to auscultation bilaterally Abd:  No distention.  Soft nontender Other:  Full range of motion all extremities.   No hip tenderness.  Pelvis stable.  No limb shortening. Dry oral mucosa.   ED Results / Procedures / Treatments   Labs (all labs ordered are listed, but only abnormal results are displayed) Labs Reviewed  BASIC METABOLIC PANEL - Abnormal; Notable for the following components:      Result Value   Sodium 132 (*)    Potassium 2.8 (*)    Chloride 93 (*)    Glucose, Bld 107 (*)    Calcium 8.6 (*)    All other components within normal limits  CBC WITH DIFFERENTIAL/PLATELET - Abnormal; Notable for the following components:   RBC 5.16 (*)    Hemoglobin 11.2 (*)    HCT 34.3 (*)    MCV 66.5 (*)    MCH 21.7 (*)    All other components within normal limits  URINALYSIS, W/ REFLEX TO CULTURE (INFECTION SUSPECTED) - Abnormal; Notable for the following components:   Color, Urine YELLOW (*)    APPearance CLEAR (*)  Bacteria, UA RARE (*)    All other components within normal limits     EKG    RADIOLOGY CT head interpreted by me, negative for intracranial hemorrhage.  Radiology report reviewed   PROCEDURES:  Procedures   MEDICATIONS ORDERED IN ED: Medications  sodium chloride 0.9 % bolus 1,000 mL (0 mLs Intravenous Stopped 02/23/23 1458)  ondansetron (ZOFRAN) injection 4 mg (4 mg Intravenous Given 02/23/23 1353)  potassium chloride (KLOR-CON) packet 60 mEq (60 mEq Oral Given 02/23/23 1458)     IMPRESSION / MDM / ASSESSMENT AND PLAN / ED COURSE  I reviewed the triage vital signs and the nursing notes.  DDx: Intracranial hemorrhage, dehydration, AKI, electrolyte abnormality, viral syndrome  Patient's presentation is most consistent with acute presentation with potential threat to life or bodily function.  Patient presents with fatigue, loss of appetite in the setting of COVID infection.  Breathing comfortably, vital signs unremarkable.  Will obtain CT head and check labs.  Give IV fluids and Zofran for hydration and symptom relief.   Clinical Course as of 02/23/23 1509   Thu Feb 23, 2023  1452 Orthostatics negative, however, pt symptomatically not improving after IVF. Still feels very weak, unable to ambulate without assistance. Her spouse is currently in rehab due to recent illness and pt is alone and unable to maintain her ADLs currently in the setting of this acute illness.  [PS]    Clinical Course User Index [PS] Sharman Cheek, MD     ----------------------------------------- 3:09 PM on 02/23/2023 ----------------------------------------- Case discussed with hospitalist  FINAL CLINICAL IMPRESSION(S) / ED DIAGNOSES   Final diagnoses:  COVID-19 virus infection  Generalized weakness     Rx / DC Orders   ED Discharge Orders     None        Note:  This document was prepared using Dragon voice recognition software and may include unintentional dictation errors.   Sharman Cheek, MD 02/23/23 [phone]

## 2023-02-23 NOTE — ED Triage Notes (Signed)
Presents via EMS from home s/p fall  States she fell in [employer] she has been weak  Nurse, adult on cabinet and landed on right hip  Was able to get up from floor

## 2023-02-24 ENCOUNTER — Telehealth: Payer: Self-pay | Admitting: Family Medicine

## 2023-02-24 DIAGNOSIS — E876 Hypokalemia: Secondary | ICD-10-CM | POA: Diagnosis not present

## 2023-02-24 LAB — BASIC METABOLIC PANEL
Anion gap: 9 (ref 5–15)
BUN: 10 mg/dL (ref 8–23)
CO2: 30 mmol/L (ref 22–32)
Calcium: 8.4 mg/dL — ABNORMAL LOW (ref 8.9–10.3)
Chloride: 96 mmol/L — ABNORMAL LOW (ref 98–111)
Creatinine, Ser: 0.65 mg/dL (ref 0.44–1.00)
GFR, Estimated: >60 mL/min (ref >60)
Glucose, Bld: 90 mg/dL (ref 70–99)
Potassium: 3.6 mmol/L (ref 3.5–5.1)
Sodium: 135 mmol/L (ref 135–145)

## 2023-02-24 LAB — CBC
HCT: 33.5 % — ABNORMAL LOW (ref 36.0–46.0)
Hemoglobin: 10.9 g/dL — ABNORMAL LOW (ref 12.0–15.0)
MCH: 21.5 pg — ABNORMAL LOW (ref 26.0–34.0)
MCHC: 32.5 g/dL (ref 30.0–36.0)
MCV: 66.2 fL — ABNORMAL LOW (ref 80.0–100.0)
Platelets: 166 10*3/uL (ref 150–400)
RBC: 5.06 MIL/uL (ref 3.87–5.11)
RDW: 14.6 % (ref 11.5–15.5)
WBC: 7.7 10*3/uL (ref 4.0–10.5)
nRBC: 0 % (ref 0.0–0.2)

## 2023-02-24 LAB — CK: Total CK: 46 U/L (ref 38–234)

## 2023-02-24 LAB — FOLATE: Folate: 12 ng/mL (ref >5.9)

## 2023-02-24 LAB — VITAMIN B12: Vitamin B-12: 1827 pg/mL — ABNORMAL HIGH (ref 180–914)

## 2023-02-24 LAB — VITAMIN D 25 HYDROXY (VIT D DEFICIENCY, FRACTURES): Vit D, 25-Hydroxy: 15.84 ng/mL — ABNORMAL LOW (ref 30–100)

## 2023-02-24 LAB — GLUCOSE, CAPILLARY: Glucose-Capillary: 91 mg/dL (ref 70–99)

## 2023-02-24 LAB — PHOSPHORUS: Phosphorus: 2.8 mg/dL (ref 2.5–4.6)

## 2023-02-24 MED ORDER — AMLODIPINE BESYLATE 5 MG PO TABS: 5.0000 mg | ORAL_TABLET | Freq: Every day | ORAL | Status: DC | PRN

## 2023-02-24 MED ORDER — PANTOPRAZOLE SODIUM 40 MG PO TBEC
40.0000 mg | DELAYED_RELEASE_TABLET | Freq: Every day | ORAL | Status: DC
Start: 2023-02-24 — End: 2023-02-25
  Administered 2023-02-24: 40 mg via ORAL
  Filled 2023-02-24: qty 1

## 2023-02-24 MED ORDER — ADULT MULTIVITAMIN W/MINERALS CH
1.0000 | ORAL_TABLET | Freq: Every day | ORAL | Status: DC
  Administered 2023-02-24 – 2023-02-26 (×3): 1 via ORAL
  Filled 2023-02-24 (×3): qty 1

## 2023-02-24 MED ORDER — MAGNESIUM SULFATE 4 GM/100ML IV SOLN
4.0000 g | Freq: Once | INTRAVENOUS | Status: AC
  Administered 2023-02-24: 4 g via INTRAVENOUS
  Filled 2023-02-24: qty 100

## 2023-02-24 MED ORDER — HYDRALAZINE HCL 10 MG PO TABS: 10.0000 mg | ORAL_TABLET | Freq: Four times a day (QID) | ORAL | Status: DC | PRN

## 2023-02-24 MED ORDER — SACCHAROMYCES BOULARDII 250 MG PO CAPS
250.0000 mg | ORAL_CAPSULE | Freq: Two times a day (BID) | ORAL | Status: DC
  Administered 2023-02-24 – 2023-02-26 (×5): 250 mg via ORAL
  Filled 2023-02-24 (×5): qty 1

## 2023-02-24 MED ORDER — ENSURE ENLIVE PO LIQD
237.0000 mL | Freq: Three times a day (TID) | ORAL | Status: DC
  Administered 2023-02-24 – 2023-02-25 (×4): 237 mL via ORAL

## 2023-02-24 MED ORDER — HYDRALAZINE HCL 50 MG PO TABS
50.0000 mg | ORAL_TABLET | Freq: Four times a day (QID) | ORAL | Status: DC | PRN
  Administered 2023-02-24: 50 mg via ORAL
  Filled 2023-02-24: qty 1

## 2023-02-24 NOTE — Progress Notes (Signed)
[facility] Progress Note  Patient: Phyllis Ross    ZOX:[id number]  DOA: 02/23/2023     Date of Service: the patient was seen and examined on 02/24/2023  Chief Complaint  Patient presents with   Fall   Brief hospital course: Phyllis Ross is a 87 y.o. female with medical history significant of cecum CA status post right hemicolectomy in 2022, HLD, CAD CABG in 2008, presented with persistent diarrhea and generalized weakness and frequent falls.   Symptoms started last Sunday, patient started develop productive cough with whitish phlegm production, no wheezing no fever chills.  Went to see PCP on Monday, who did a office COVID study which turned positive.  Patient was started on Paxlovid and azithromycin.  Monday evening, patient started to have loose bowel movement 4-5 times a day every day for the last 3+ days, no abdominal pain, no nauseous vomiting.  She also has loss of taste during the time.  Last few days, patient developed worsening of generalized weakness and sustained several falls at home, but denied any LOC no head or neck injuries.   ED Course: Afebrile, nontachycardic, not hypotensive.  O2 saturation 93% on room air.  Chest x-ray showed no acute infiltrates.  Blood work showed K2.8, WBC 8.9, hemoglobin 11.2, creatinine 0.7 bicarb 27.   Assessment and Plan:  # Severe hypokalemia -Secondary to acute GI loss from persistent diarrhea likely secondary to COVID Potassium repleted  # Hypomagnesemia, mag repleted. Monitor electrolytes and replete as needed.    # COVID infection Loss of taste and Loose diarrhea -Continue Paxlovid x 2 days to complete a 5 days session. -Trial of Imodium for diarrhea Started probiotics twice daily  # Moderate protein caloric malnutrition -Lost about 20 pounds after colon surgery 2022 and never came back. -Continue Ensure supplement -Consult dietitian   # HTN and CAD Patient is not on antiplatelet therapy, not on antihypertensive  medications Blood pressure slightly elevated Started hydralazine 50 mg p.o. every 6 hourly as needed Monitor BP and titrate medications accordingly  # HLD: held statin for now due to generalized weakness.   Body mass index is 16.95 kg/m.  Interventions:  Diet: Regular diet DVT Prophylaxis: Subcutaneous Lovenox   Advance goals of care discussion: Full code  Family Communication: family was present at bedside, at the time of interview.  The pt provided permission to discuss medical plan with the family. Opportunity was given to ask question and all questions were answered satisfactorily.  11/22 Discussed with patient's son over the phone  Disposition:  Pt is from Home, admitted with hypokalemia secondary to diarrhea, due to COVID infection, still has diarrhea and electrolyte imbalance, which precludes a safe discharge. Discharge to home with home health TBD after PT and OT eval, when stable, may need 1-2 more days to improve.  Subjective: No significant events overnight, patient still has generalized weakness, head loose stools 3 episodes today and 4 episodes yesterday.  Denies any nausea vomiting, no abdominal pain.  No chest pain or palpitation, no shortness of breath.  Physical Exam: General: NAD, lying comfortably Appear in no distress, affect appropriate Eyes: PERRLA ENT: Oral Mucosa Clear, moist  Neck: no JVD,  Cardiovascular: S1 and S2 Present, no Murmur,  Respiratory: good respiratory effort, Bilateral Air entry equal and Decreased, no Crackles, no wheezes Abdomen: Bowel Sound present, Soft and no tenderness,  Skin: no rashes Extremities: no Pedal edema, no calf tenderness Neurologic: without any new focal findings Gait not checked due to patient safety  concerns  Vitals:   02/24/23 0045 02/24/23 0430 02/24/23 0755 02/24/23 1206  BP: (!) 162/81 (!) 145/79 (!) 159/66 (!) 123/56  Pulse: 74 65 62 (!) 53  Resp: 20 18 14 16   Temp: 98.5 F (36.9 C) 98.1 F (36.7 C) 98.9  F (37.2 C)   TempSrc: Oral Tympanic    SpO2: 98% 97% 94% 95%  Weight:      Height:       No intake or output data in the 24 hours ending 02/24/23 1244 Filed Weights   02/23/23 1312  Weight: 46.2 kg    Data Reviewed: I have personally reviewed and interpreted daily labs, tele strips, imagings as discussed above. I reviewed all nursing notes, pharmacy notes, vitals, pertinent old records I have discussed plan of care as described above with RN and patient/family.  CBC: Recent Labs  Lab 02/23/23 1345 02/24/23 0601  WBC 8.9 7.7  NEUTROABS 7.2  --   HGB 11.2* 10.9*  HCT 34.3* 33.5*  MCV 66.5* 66.2*  PLT 167 166   Basic Metabolic Panel: Recent Labs  Lab 02/20/23 1514 02/23/23 1345 02/23/23 2042 02/24/23 0601  NA 134* 132*  --  135  K 3.3* 2.8* 3.8 3.6  CL 94* 93*  --  96*  CO2 29 27  --  30  GLUCOSE 119* 107*  --  90  BUN 21 16  --  10  CREATININE 0.78 0.78  --  0.65  CALCIUM 8.7* 8.6*  --  8.4*  MG  --   --  1.3*  --   PHOS 3.8  --   --  2.8    Studies: CT Head Wo Contrast  Result Date: 02/23/2023 CLINICAL DATA:  Head trauma, minor (Age >= 65y) EXAM: CT HEAD WITHOUT CONTRAST TECHNIQUE: Contiguous axial images were obtained from the base of the skull through the vertex without intravenous contrast. RADIATION DOSE REDUCTION: This exam was performed according to the departmental dose-optimization program which includes automated exposure control, adjustment of the mA and/or kV according to patient size and/or use of iterative reconstruction technique. COMPARISON:  None Available. FINDINGS: Brain: No hemorrhage. No hydrocephalus. No extra-axial fluid collection. No CT evidence of an acute cortical infarct. No mass effect. No mass lesion. There is mineralization of the basal ganglia bilaterally. Vascular: No hyperdense vessel or unexpected calcification. Skull: Normal. Negative for fracture or focal lesion. Sinuses/Orbits: No middle ear or mastoid effusion. Paranasal  sinuses are clear. Orbits are notable bilateral lens replacement, otherwise unremarkable. Other: None. IMPRESSION: No CT evidence of intracranial injury. Electronically Signed   By: Lorenza Cambridge M.D.   On: 02/23/2023 13:53    Scheduled Meds:  azithromycin  250 mg Oral Daily   enoxaparin (LOVENOX) injection  40 mg Subcutaneous Q24H   feeding supplement  237 mL Oral TID BM   ferrous sulfate  325 mg Oral Q breakfast   guaiFENesin  600 mg Oral BID   Ipratropium-Albuterol  1 puff Inhalation Q6H   meloxicam  7.5 mg Oral Daily   montelukast  10 mg Oral QHS   multivitamin with minerals  1 tablet Oral Daily   nirmatrelvir/ritonavir (renal dosing)  2 tablet Oral BID   saccharomyces boulardii  250 mg Oral BID   Continuous Infusions: PRN Meds: acetaminophen **OR** acetaminophen, benzonatate, hydrALAZINE, loperamide, ondansetron **OR** ondansetron (ZOFRAN) IV  Time spent: 35 minutes  Author: Gillis Santa. MD [facility] 02/24/2023 12:44 PM  To reach On-call, see care teams to locate the attending and reach  out to them via [email or web address]. If 7PM-7AM, please contact night-coverage If you still have difficulty reaching the attending provider, please page the [facility] (Director on Call) for [facility] on amion for assistance.

## 2023-02-24 NOTE — Progress Notes (Addendum)
Initial Nutrition Assessment  DOCUMENTATION CODES:   Underweight, Severe malnutrition in context of chronic illness  INTERVENTION:   -Ensure Enlive po TID, each supplement provides 350 kcal and 20 grams of protein -Continue regular diet for widest variety of meal selections -MVI with minerals daily -Magic cup TID with meals, each supplement provides 290 kcal and 9 grams of protein   NUTRITION DIAGNOSIS:   Severe Malnutrition related to chronic illness (cecum cancer s/p rt hemicolectomy) as evidenced by moderate fat depletion, severe fat depletion, moderate muscle depletion, severe muscle depletion.  GOAL:   Patient will meet greater than or equal to 90% of their needs  MONITOR:   PO intake, Supplement acceptance  REASON FOR ASSESSMENT:   Consult Assessment of nutrition requirement/status  ASSESSMENT:   Pt with medical history significant of cecum CA status post right hemicolectomy in 2022, HLD, CAD CABG in 2008, presented with persistent diarrhea and generalized weakness and frequent falls.  Pt admitted with severe hypokalemia, hypomagnesemia, and COVID-19.   Reviewed I/O's: +400 ml x 24 hours   Spoke with pt at bedside, who reports feeling "no better, no less" today. Pt shares that she has has experienced a general decline in health over the past few months. She initially reports weight loss was related to stress from caring for her husband, which she is the primary caregiver. Over the past few weeks, she reports she developed COVID and side effects (lack of taste and smell). Pt shares that she does not have the desire to eat due to this. Pt consumed a few sips of juice and a few bites of oatmeal- RD provided sugar packets as pt feels that she can eat better with additional seasoning packets.   Pt reports increased weakness and falls, which is what brought her to the hospital initially.   Pt reports her UBW is around 122#, which she last weighed prior to cancer treatments  and diagnosis. Pt lost 25# during cancer treatments and has since been unable to regain lost weight. Per pt. Her UBW is around 102# and suspect she has "lost a few pounds" over the past few weeks due to declining oral intake. Reviewed wt hx; pt has experienced a 4% wt loss over the past 6 months, which is not significant for time frame.   Discussed importance of good meal and supplement intake to promote healing. Pt amenable to supplements, stating she drinks Ensure 2 times daily at home.   Medications reviewed and include lovenox, ferrous sulfate, and florastor.   Labs reviewed: CBGS: 91.   NUTRITION - FOCUSED PHYSICAL EXAM:  Flowsheet Row Most Recent Value  Orbital Region Severe depletion  Upper Arm Region Severe depletion  Thoracic and Lumbar Region Moderate depletion  Buccal Region Severe depletion  Temple Region Severe depletion  Clavicle Bone Region Severe depletion  Clavicle and Acromion Bone Region Severe depletion  Scapular Bone Region Severe depletion  Dorsal Hand Moderate depletion  Patellar Region Moderate depletion  Anterior Thigh Region Moderate depletion  Posterior Calf Region Moderate depletion  Edema (RD Assessment) None  Hair Reviewed  Eyes Reviewed  Mouth Reviewed  Skin Reviewed  Nails Reviewed       Diet Order:   Diet Order             Diet regular Room service appropriate? Yes; Fluid consistency: Thin  Diet effective now                   EDUCATION NEEDS:   Education needs have  been addressed  Skin:  Skin Assessment: Reviewed RN Assessment  Last BM:  02/23/23  Height:   Ht Readings from Last 1 Encounters:  02/23/23 5\' 5"  (1.651 m)    Weight:   Wt Readings from Last 1 Encounters:  02/23/23 46.2 kg    Ideal Body Weight:  56.8 kg  BMI:  Body mass index is 16.95 kg/m.  Estimated Nutritional Needs:   Kcal:  1400-1600  Protein:  75-90 grams  Fluid:  > 1.4 L    Levada Schilling, RD, LDN, CDCES Registered Dietitian III Certified  Diabetes Care and Education Specialist Please refer to [facility] for RD and/or RD on-call/weekend/after hours pager

## 2023-02-24 NOTE — Evaluation (Signed)
Physical Therapy Evaluation Patient Details Name: Phyllis Ross MRN: [id number] DOB: 08/25/1935 Today's Date: 02/24/2023  History of Present Illness  Phyllis Ross is an 87yoF who comes to [facility]/23/23 c diarrhea, weakness, fall x1.   Sx onset Sunday 11/17, saw PCP, (+) COVID19.  PMH: cecum CA s/p hemicolectomy 2022, HLD, CABG 2008.  Clinical Impression  Pt asleep on entry, awaken to verbal greeting, is agreeable to session. Pt has had opportunities to be mobile with staff today, continues to feel grossly fatigued, hospital sleep deficits are no making matters better. Pt has brief unsteadiness upon rising, seems to improve as we walk more. Pt tolerates AMB in room to 274ft with several turns, no device used, no frank LOB, is obviously slower than her typical walking speed. Pt denies any pain in head or hip related to her kitchen/toast fall. Pt will benefit from future out of room walks with mask and catered balance program. Will continue to follow.       If plan is discharge home, recommend the following: A little help with bathing/dressing/bathroom;Assist for transportation;Assistance with cooking/housework   Can travel by private vehicle        Equipment Recommendations None recommended by PT  Recommendations for Other Services       Functional Status Assessment Patient has had a recent decline in their functional status and demonstrates the ability to make significant improvements in function in a reasonable and predictable amount of time.     Precautions / Restrictions Precautions Precautions: Fall Restrictions Weight Bearing Restrictions: No      Mobility  Bed Mobility Overal bed mobility: Modified Independent                  Transfers Overall transfer level: Modified independent Equipment used: None Transfers: Sit to/from Stand Sit to Stand: Supervision           General transfer comment: no    Ambulation/Gait Ambulation/Gait assistance:  Modified independent (Device/Increase time) Gait Distance (Feet): 200 Feet Assistive device: None Gait Pattern/deviations: WFL(Within Functional Limits)          Stairs            Wheelchair Mobility     Tilt Bed    Modified Rankin (Stroke Patients Only)       Balance                                             Pertinent Vitals/Pain Pain Assessment Pain Assessment: No/denies pain    Home Living Family/patient expects to be discharged to:: Private residence Living Arrangements: Spouse/significant other (currently at [facility] since 11/10) Available Help at Discharge: Family Type of Home: House Home Access: Stairs to enter Entrance Stairs-Rails: None Entrance Stairs-Number of Steps: 2   Home Layout: One level Home Equipment: Agricultural consultant (2 wheels);Wheelchair - manual;BSC/3in1 Additional Comments: pt reports spouse is at Armed forces operational officer for [employer], plans to have her DTR/Son assist post DC    Prior Function Prior Level of Function : Independent/Modified Independent             Mobility Comments: no AD use typically, endorses 1 fall while making toast (lost her footing); was playing softball until she was 87yo (first base)       Extremity/Trunk Assessment                Communication  Cognition                                                General Comments      Exercises     Assessment/Plan    PT Assessment Patient needs continued PT services  PT Problem List Decreased strength;Decreased range of motion;Decreased activity tolerance;Decreased balance;Decreased mobility;Decreased coordination;Decreased cognition;Decreased safety awareness       PT Treatment Interventions Gait training;Stair training;Functional mobility training;Therapeutic activities;Therapeutic exercise;Balance training;Neuromuscular re-education;Patient/family education;DME instruction    PT Goals (Current goals can be found in the  Care Plan section)  Acute Rehab PT Goals Patient Stated Goal: regain baseline energy levels PT Goal Formulation: With patient Time For Goal Achievement: 03/10/23 Potential to Achieve Goals: Good    Frequency Min 1X/week     Co-evaluation               AM-PAC PT "6 Clicks" Mobility  Outcome Measure Help needed turning from your back to your side while in a flat bed without using bedrails?: None Help needed moving from lying on your back to sitting on the side of a flat bed without using bedrails?: None Help needed moving to and from a bed to a chair (including a wheelchair)?: A Little Help needed standing up from a chair using your arms (e.g., wheelchair or bedside chair)?: A Little Help needed to walk in hospital room?: A Little Help needed climbing 3-5 steps with a railing? : A Little 6 Click Score: 20    End of Session   Activity Tolerance: Patient tolerated treatment well;No increased pain Patient left: in bed;with call bell/phone within reach Nurse Communication: Mobility status PT Visit Diagnosis: Unsteadiness on feet (R26.81);Other abnormalities of gait and mobility (R26.89);Muscle weakness (generalized) (M62.81);Difficulty in walking, not elsewhere classified (R26.2)    Time: 1191-4782 PT Time Calculation (min) (ACUTE ONLY): 20 min   Charges:   PT Evaluation $PT Eval Moderate Complexity: 1 Mod   PT General Charges $$ ACUTE PT VISIT: 1 Visit        2:09 PM, 02/24/23 Rosamaria Lints, PT, DPT Physical Therapist - [facility]  [phone] (ASCOM)    Sister Carbone C 02/24/2023, 2:06 PM

## 2023-02-24 NOTE — Plan of Care (Signed)
  Problem: Education: Goal: Knowledge of risk factors and measures for prevention of condition will improve Outcome: Progressing   Problem: Coping: Goal: Psychosocial and spiritual needs will be supported Outcome: Progressing   Problem: Clinical Measurements: Goal: Ability to maintain clinical measurements within normal limits will improve Outcome: Progressing   Problem: Nutrition: Goal: Adequate nutrition will be maintained Outcome: Progressing

## 2023-02-24 NOTE — Evaluation (Signed)
Occupational Therapy Evaluation Patient Details Name: Phyllis Ross MRN: [id number] DOB: [birth year] Today's Date: 02/24/2023   History of Present Illness DIAMONDS ABOUD is a 87 y.o. female with medical history significant of cecum CA status post right hemicolectomy in 2022, HLD, CAD CABG in 2008, presented with persistent diarrhea and generalized weakness and falls.   Clinical Impression   Ms Dilullo was seen for OT evaluation this date. Prior to hospital admission, pt was IND. Pt lives with spouse who is currently at [facility] for STR, plans to have her children come stay with her on d/c. Pt presents to acute OT demonstrating impaired functional mobility 2/2 decreased activity tolerance. Pt currently requires SUPERVISION for toilet t/f and standing grooming tasks, IND pericare sitting and don/doff B socks. Reports 9/10 fatigue, recommend use of RW to improve activity tolerance. Pt would benefit from skilled OT to address noted impairments and functional limitations (see below for any additional details). Upon hospital discharge, recommend no OT follow up.    If plan is discharge home, recommend the following: Help with stairs or ramp for entrance;Assistance with cooking/housework    Functional Status Assessment  Patient has had a recent decline in their functional status and demonstrates the ability to make significant improvements in function in a reasonable and predictable amount of time.  Equipment Recommendations  None recommended by OT    Recommendations for Other Services       Precautions / Restrictions Precautions Precautions: Fall Restrictions Weight Bearing Restrictions: No      Mobility Bed Mobility Overal bed mobility: Modified Independent             General bed mobility comments: HOB    Transfers Overall transfer level: Needs assistance Equipment used: None Transfers: Sit to/from Stand Sit to Stand: Supervision                   Balance Overall balance assessment: Needs assistance Sitting-balance support: No upper extremity supported, Feet supported Sitting balance-Leahy Scale: Normal     Standing balance support: No upper extremity supported, During functional activity Standing balance-Leahy Scale: Fair                             ADL either performed or assessed with clinical judgement   ADL Overall ADL's : Needs assistance/impaired                                       General ADL Comments: SUPERVISION for toilet t/f and standing grooming tasks, IND pericare sitting and don/doff B socks      Pertinent Vitals/Pain Pain Assessment Pain Assessment: Faces Faces Pain Scale: Hurts little more Pain Location: generalized malaise Pain Descriptors / Indicators: Discomfort Pain Intervention(s): Limited activity within patient's tolerance, Repositioned     Extremity/Trunk Assessment Upper Extremity Assessment Upper Extremity Assessment: Overall WFL for tasks assessed   Lower Extremity Assessment Lower Extremity Assessment: Generalized weakness       Communication Communication Communication: No apparent difficulties   Cognition Arousal: Alert Behavior During Therapy: WFL for tasks assessed/performed Overall Cognitive Status: Within Functional Limits for tasks assessed                                       General Comments  SpO2 94%  on RA            Home Living Family/patient expects to be discharged to:: Private residence Living Arrangements: Spouse/significant other Available Help at Discharge: Family Type of Home: House Home Access: Stairs to enter Secretary/administrator of Steps: 2 Entrance Stairs-Rails: None Home Layout: One level         Bathroom Toilet: Handicapped height     Home Equipment: Agricultural consultant (2 wheels);Wheelchair - manual;BSC/3in1   Additional Comments: pt reports spouse is at Armed forces operational officer for [employer], plans to have  her children come stay with her      Prior Functioning/Environment Prior Level of Function : Independent/Modified Independent             Mobility Comments: no AD use, endorses 1 fall          OT Problem List: Impaired balance (sitting and/or standing);Decreased activity tolerance      OT Treatment/Interventions: Self-care/ADL training;Therapeutic exercise;Energy conservation;DME and/or AE instruction;Therapeutic activities    OT Goals(Current goals can be found in the care plan section) Acute Rehab OT Goals Patient Stated Goal: to go home OT Goal Formulation: With patient Time For Goal Achievement: 03/10/23 Potential to Achieve Goals: Good ADL Goals Pt Will Perform Grooming: Independently;standing Pt Will Perform Lower Body Dressing: Independently;sit to/from stand Pt Will Transfer to Toilet: Independently;ambulating;regular height toilet  OT Frequency: Min 1X/week    Co-evaluation              AM-PAC OT "6 Clicks" Daily Activity     Outcome Measure Help from another person eating meals?: None Help from another person taking care of personal grooming?: None Help from another person toileting, which includes using toliet, bedpan, or urinal?: A Little Help from another person bathing (including washing, rinsing, drying)?: A Little Help from another person to put on and taking off regular upper body clothing?: None Help from another person to put on and taking off regular lower body clothing?: None 6 Click Score: 22   End of Session    Activity Tolerance: Patient tolerated treatment well Patient left: in bed;with call bell/phone within reach;with bed alarm set  OT Visit Diagnosis: Unsteadiness on feet (R26.81)                Time: 1610-9604 OT Time Calculation (min): 20 min Charges:  OT General Charges $OT Visit: 1 Visit OT Evaluation $OT Eval Low Complexity: 1 Low  Kathie Dike, M.S. OTR/L  02/24/23, 9:38 AM  ascom [phone]

## 2023-02-24 NOTE — Telephone Encounter (Signed)
Copied from CRM [id number]. Topic: General - Inquiry >> Feb 24, 2023 11:19 AM Lennox Pippins wrote: Patient's son Zackery Barefoot, has called, requesting to speak to patient's PCP in regards to patient being admitted at [facility] and patient's son wants to make Dr Yetta Barre aware and also make sure that [facility] [place] keeps patient until atleast Monday 02/27/2023 until she regains some of her strength.    Please advise and contact patients son Zackery Barefoot back at phone  # [phone].

## 2023-02-25 DIAGNOSIS — E876 Hypokalemia: Secondary | ICD-10-CM | POA: Diagnosis not present

## 2023-02-25 LAB — BASIC METABOLIC PANEL
Anion gap: 9 (ref 5–15)
BUN: 11 mg/dL (ref 8–23)
CO2: 34 mmol/L — ABNORMAL HIGH (ref 22–32)
Calcium: 8.1 mg/dL — ABNORMAL LOW (ref 8.9–10.3)
Chloride: 91 mmol/L — ABNORMAL LOW (ref 98–111)
Creatinine, Ser: 0.91 mg/dL (ref 0.44–1.00)
GFR, Estimated: >60 mL/min (ref >60)
Glucose, Bld: 96 mg/dL (ref 70–99)
Potassium: 3.9 mmol/L (ref 3.5–5.1)
Sodium: 134 mmol/L — ABNORMAL LOW (ref 135–145)

## 2023-02-25 LAB — GLUCOSE, CAPILLARY: Glucose-Capillary: 98 mg/dL (ref 70–99)

## 2023-02-25 LAB — CBC
HCT: 35.5 % — ABNORMAL LOW (ref 36.0–46.0)
Hemoglobin: 11.5 g/dL — ABNORMAL LOW (ref 12.0–15.0)
MCH: 21.8 pg — ABNORMAL LOW (ref 26.0–34.0)
MCHC: 32.4 g/dL (ref 30.0–36.0)
MCV: 67.4 fL — ABNORMAL LOW (ref 80.0–100.0)
Platelets: 190 10*3/uL (ref 150–400)
RBC: 5.27 MIL/uL — ABNORMAL HIGH (ref 3.87–5.11)
RDW: 14.6 % (ref 11.5–15.5)
WBC: 9.1 10*3/uL (ref 4.0–10.5)
nRBC: 0 % (ref 0.0–0.2)

## 2023-02-25 LAB — MAGNESIUM: Magnesium: 1.9 mg/dL (ref 1.7–2.4)

## 2023-02-25 LAB — PHOSPHORUS: Phosphorus: 2.9 mg/dL (ref 2.5–4.6)

## 2023-02-25 LAB — CK: Total CK: 74 U/L (ref 38–234)

## 2023-02-25 MED ORDER — PANTOPRAZOLE SODIUM 40 MG PO TBEC
40.0000 mg | DELAYED_RELEASE_TABLET | Freq: Every day | ORAL | Status: DC
Start: 2023-02-25 — End: 2023-02-26
  Administered 2023-02-25 – 2023-02-26 (×2): 40 mg via ORAL
  Filled 2023-02-25 (×2): qty 1

## 2023-02-25 MED ORDER — VITAMIN D (ERGOCALCIFEROL) 1.25 MG (50000 UNIT) PO CAPS
50000.0000 [IU] | ORAL_CAPSULE | ORAL | Status: DC
  Administered 2023-02-25: 50000 [IU] via ORAL
  Filled 2023-02-25: qty 1

## 2023-02-25 NOTE — Plan of Care (Signed)
  Problem: Education: Goal: Knowledge of risk factors and measures for prevention of condition will improve Outcome: Progressing   Problem: Coping: Goal: Psychosocial and spiritual needs will be supported Outcome: Progressing   Problem: Health Behavior/Discharge Planning: Goal: Ability to manage health-related needs will improve Outcome: Progressing   Problem: Activity: Goal: Risk for activity intolerance will decrease Outcome: Progressing

## 2023-02-25 NOTE — TOC Initial Note (Signed)
Transition of Care [facility]) - Initial/Assessment Note    Patient Details  Name: Phyllis Ross MRN: [id number] Date of Birth: [birth year]  [facility]) CM/SW Contact:    Rodney Langton, RN Phone Number: 02/25/2023, 3:06 PM  Clinical Narrative:                  Patient admitted from home, usually lives with husband but he is currently at [facility] for rehab.  Dr. Yetta Barre is PCP, medications are from [facility].  Spoke with son and daughter, they are wanting patient to remain in the hospital until Monday, which will provide time to get the house disinfected and cleaned as well as plan to have 24/7 care provided by home agency (this has been set up through [facility]).  Patient agrees to have HHPT as recommended, prefers [facility], referral accepted by [facility].  Will plan for discharge home on Monday.   Expected Discharge Plan: Home w Home Health Services Barriers to Discharge: Continued Medical Work up   Patient Goals and CMS Choice Patient states their goals for this hospitalization and ongoing recovery are:: Home with home health          Expected Discharge Plan and Services     Post Acute Care Choice: Home Health Living arrangements for the past 2 months: Single Family Home                           HH Arranged: PT, OT HH Agency: [facility] Date HH Agency Contacted: 02/25/23 Time HH Agency Contacted: 1506 Representative spoke with at [facility] Agency: Bjorn Loser  Prior Living Arrangements/Services Living arrangements for the past 2 months: Single Family Home Lives with:: Spouse   Do you feel safe going back to the place where you live?: Yes          Current home services: DME    Activities of Daily Living   ADL Screening (condition at time of admission) Independently performs ADLs?: Yes (appropriate for developmental age) Is the patient deaf or have difficulty hearing?: No Does the patient have difficulty seeing, even when wearing  glasses/contacts?: No Does the patient have difficulty concentrating, remembering, or making decisions?: No  Permission Sought/Granted   Permission granted to share information with : Yes, Verbal Permission Granted              Emotional Assessment              Admission diagnosis:  Hypokalemia [E87.6] Generalized weakness [R53.1] COVID-19 virus infection [U07.1] Patient Active Problem List   Diagnosis Date Noted   Hypokalemia 02/23/2023   COVID-19 virus infection 02/23/2023   Primary osteoarthritis of right hip 07/24/2022   History of colon cancer    Polyp of transverse colon    B12 deficiency 09/16/2020   Goals of care, counseling/discussion 08/07/2020   Microcytic anemia 07/23/2020   Status post partial colectomy 07/02/2020   Protein-calorie malnutrition, severe 06/24/2020   Cecal cancer (HCC) 06/15/2020   Iron deficiency anemia due to chronic blood loss    Neoplasm of lower gastrointestinal tract    Acute gastritis with hemorrhage    Personal history of other malignant neoplasm of skin 01/21/2019   Atherosclerosis of abdominal aorta (HCC) 12/26/2017   Age-related osteoporosis without current pathological fracture 06/16/2016   Encounter for follow-up surveillance of ovarian cancer 05/20/2015   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   Wedging of vertebra (HCC) 10/13/2014   Polypharmacy  10/13/2014   Encounter for general adult medical examination without abnormal findings 10/13/2014   Anxiety 10/13/2014   Essential hypertension 08/27/2014   Bradycardia 08/27/2014   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 [place] [address] Phone: [phone] Fax: [phone]  [facility] - [place], [place] - [address] [place] [address] Phone: [phone] Fax: [phone]     Social Determinants of Health (SDOH) Social History: SDOH Screenings   Food Insecurity: No Food Insecurity (02/23/2023)  Housing: Low Risk  (02/23/2023)  Transportation Needs: No Transportation Needs (02/23/2023)  Utilities: Not At Risk (02/23/2023)  Alcohol Screen: Low Risk  (02/03/2021)  Depression (PHQ2-9): Low Risk  (02/20/2023)  Financial Resource Strain: Low Risk  (02/03/2021)  Physical Activity: Sufficiently Active (02/09/2022)  Social Connections: Moderately Integrated (02/09/2022)  Stress: No Stress Concern Present (02/09/2022)  Tobacco Use: Low Risk  (02/23/2023)   SDOH Interventions:     Readmission Risk Interventions    06/18/2020    4:21 PM  Readmission Risk Prevention Plan  Medication Screening Complete  Transportation Screening Complete

## 2023-02-25 NOTE — Progress Notes (Signed)
[facility] Progress Note  Patient: Phyllis Ross    VOZ:[id number]  DOA: 02/23/2023     Date of Service: the patient was seen and examined on 02/25/2023  Chief Complaint  Patient presents with   Fall   Brief hospital course: JENTRY KRISTOFF is a 87 y.o. female with medical history significant of cecum CA status post right hemicolectomy in 2022, HLD, CAD CABG in 2008, presented with persistent diarrhea and generalized weakness and frequent falls.   Symptoms started last Sunday, patient started develop productive cough with whitish phlegm production, no wheezing no fever chills.  Went to see PCP on Monday, who did a office COVID study which turned positive.  Patient was started on Paxlovid and azithromycin.  Monday evening, patient started to have loose bowel movement 4-5 times a day every day for the last 3+ days, no abdominal pain, no nauseous vomiting.  She also has loss of taste during the time.  Last few days, patient developed worsening of generalized weakness and sustained several falls at home, but denied any LOC no head or neck injuries.   ED Course: Afebrile, nontachycardic, not hypotensive.  O2 saturation 93% on room air.  Chest x-ray showed no acute infiltrates.  Blood work showed K2.8, WBC 8.9, hemoglobin 11.2, creatinine 0.7 bicarb 27.   Assessment and Plan:  # Severe hypokalemia.  Resolved -Secondary to acute GI loss from persistent diarrhea likely secondary to COVID Potassium repleted   # Hypomagnesemia, mag repleted.  Resolved Monitor electrolytes and replete as needed.    # COVID infection diagnosed on 10/20/2022 Loss of taste and Loose diarrhea -Continue Paxlovid x 2 days to complete a 5 days session. -Trial of Imodium for diarrhea Started probiotics twice daily  # Moderate protein caloric malnutrition -Lost about 20 pounds after colon surgery 2022 and never came back. -Continue Ensure supplement -Consult dietitian   # HTN and CAD Patient is not on  antiplatelet therapy, not on antihypertensive medications Blood pressure slightly elevated Started hydralazine 50 mg p.o. every 6 hourly as needed Monitor BP and titrate medications accordingly  # HLD: held statin for now due to generalized weakness.  # Vitamin D deficiency: started vitamin D 50,000 units p.o. weekly, follow with PCP to repeat vitamin D level after 3 to 6 months.   Body mass index is 16.95 kg/m.  Interventions:  Diet: Regular diet DVT Prophylaxis: Subcutaneous Lovenox   Advance goals of care discussion: Full code  Family Communication: family was present at bedside, at the time of interview.  The pt provided permission to discuss medical plan with the family. Opportunity was given to ask question and all questions were answered satisfactorily.  11/23 Discussed with patient's son over the phone, he is trying to clean her mother's home, disinfect the house and arrange home care for her.  Most likely it will be done by Monday.  Due to safety concern he does not want her to be discharged home without any arrangement.   Disposition:  Pt is from Home, admitted with hypokalemia secondary to diarrhea, due to COVID infection, still has diarrhea and electrolyte imbalance, which precludes a safe discharge. Discharge to home with home health TBD after PT and OT eval, when stable, may need 1-2 more days to improve. 11/23 Discussed with patient's son over the phone, he is trying to clean her mother's home, disinfect the house and arrange home care for her.  Most likely it will be done by Monday.  Due to safety concern he does  not want her to be discharged home without any arrangement. Follow TOC for disposition plan.  Subjective: No significant events overnight, patient still feels generalized weakness and denied any other complaints.  Patient is able to walk to the bathroom.   Physical Exam: General: NAD, lying comfortably Appear in no distress, affect appropriate Eyes:  PERRLA ENT: Oral Mucosa Clear, moist  Neck: no JVD,  Cardiovascular: S1 and S2 Present, no Murmur,  Respiratory: good respiratory effort, Bilateral Air entry equal and Decreased, no Crackles, no wheezes Abdomen: Bowel Sound present, Soft and no tenderness,  Skin: no rashes Extremities: no Pedal edema, no calf tenderness Neurologic: without any new focal findings Gait not checked due to patient safety concerns  Vitals:   02/24/23 1206 02/24/23 1344 02/24/23 1616 02/25/23 0822  BP: (!) 123/56  (!) 181/77 (!) 143/74  Pulse: (!) 53 (!) 55 (!) 57 62  Resp: 16  19 20   Temp:   97.8 F (36.6 C) 98.1 F (36.7 C)  TempSrc:   Axillary   SpO2: 95%  97% 97%  Weight:      Height:       No intake or output data in the 24 hours ending 02/25/23 1429 Filed Weights   02/23/23 1312  Weight: 46.2 kg    Data Reviewed: I have personally reviewed and interpreted daily labs, tele strips, imagings as discussed above. I reviewed all nursing notes, pharmacy notes, vitals, pertinent old records I have discussed plan of care as described above with RN and patient/family.  CBC: Recent Labs  Lab 02/23/23 1345 02/24/23 0601 02/25/23 0444  WBC 8.9 7.7 9.1  NEUTROABS 7.2  --   --   HGB 11.2* 10.9* 11.5*  HCT 34.3* 33.5* 35.5*  MCV 66.5* 66.2* 67.4*  PLT 167 166 190   Basic Metabolic Panel: Recent Labs  Lab 02/20/23 1514 02/23/23 1345 02/23/23 2042 02/24/23 0601 02/25/23 0444  NA 134* 132*  --  135 134*  K 3.3* 2.8* 3.8 3.6 3.9  CL 94* 93*  --  96* 91*  CO2 29 27  --  30 34*  GLUCOSE 119* 107*  --  90 96  BUN 21 16  --  10 11  CREATININE 0.78 0.78  --  0.65 0.91  CALCIUM 8.7* 8.6*  --  8.4* 8.1*  MG  --   --  1.3*  --  1.9  PHOS 3.8  --   --  2.8 2.9    Studies: No results found.  Scheduled Meds:  feeding supplement  237 mL Oral TID BM   ferrous sulfate  325 mg Oral Q breakfast   guaiFENesin  600 mg Oral BID   Ipratropium-Albuterol  1 puff Inhalation Q6H   meloxicam  7.5 mg  Oral Daily   montelukast  10 mg Oral QHS   multivitamin with minerals  1 tablet Oral Daily   pantoprazole  40 mg Oral Daily   saccharomyces boulardii  250 mg Oral BID   Vitamin D (Ergocalciferol)  50,000 Units Oral Q7 days   Continuous Infusions: PRN Meds: acetaminophen **OR** acetaminophen, benzonatate, hydrALAZINE, loperamide, ondansetron **OR** ondansetron (ZOFRAN) IV  Time spent: 35 minutes  Author: Gillis Santa. MD [facility] 02/25/2023 2:29 PM  To reach On-call, see care teams to locate the attending and reach out to them via [email or web address]. If 7PM-7AM, please contact night-coverage If you still have difficulty reaching the attending provider, please page the [facility] (Director on Call) for [facility] on amion for assistance.

## 2023-02-26 DIAGNOSIS — E876 Hypokalemia: Secondary | ICD-10-CM | POA: Diagnosis not present

## 2023-02-26 MED ORDER — VITAMIN D (ERGOCALCIFEROL) 1.25 MG (50000 UNIT) PO CAPS: 50000.0000 [IU] | ORAL_CAPSULE | ORAL | 0 refills | Status: AC

## 2023-02-26 MED ORDER — AMLODIPINE BESYLATE 5 MG PO TABS: 5.0000 mg | ORAL_TABLET | Freq: Every day | ORAL | 2 refills | Status: DC

## 2023-02-26 MED ORDER — AMLODIPINE BESYLATE 5 MG PO TABS
5.0000 mg | ORAL_TABLET | Freq: Every day | ORAL | Status: DC
  Administered 2023-02-26: 5 mg via ORAL
  Filled 2023-02-26: qty 1

## 2023-02-26 NOTE — Progress Notes (Signed)
Mobility Specialist - Progress Note    02/26/23 0941  Mobility  Activity Stood at bedside;Ambulated with assistance in room;Dangled on edge of bed  Level of Assistance Modified independent, requires aide device or extra time  Assistive Device Front wheel walker  Distance Ambulated (ft) 70 ft  Range of Motion/Exercises Active  Activity Response Tolerated well  Mobility Referral Yes  $Mobility charge 1 Mobility  Mobility Specialist Start Time (ACUTE ONLY) [id number]  Mobility Specialist Stop Time (ACUTE ONLY) [id number]  Mobility Specialist Time Calculation (min) (ACUTE ONLY) 16 min   Pt resting in bed on RA upon entry. Pt STS and ambulates in room (Preferred) ModI with RW for 70 ft. Pt endorses feeling weak but, session went well. Pt returned to bed and left with needs in reach.   Johnathan Hausen Mobility Specialist 02/26/23, 9:45 AM

## 2023-02-26 NOTE — TOC Transition Note (Signed)
Transition of Care [facility]) - CM/SW Discharge Note   Patient Details  Name: Phyllis Ross MRN: [id number] Date of Birth: [birth year]  Transition [facility]) CM/SW Contact:  Rodney Langton, RN Phone Number: 02/26/2023, 10:41 AM   Clinical Narrative:     Spoke with daughter Judeth Cornfield, she report the family has been able to clean the home and make preparations to provide care for patient until 24/7 aide services can start.  Patient is ready to go home today, MD and [facility] made aware, home health orders requested.   Final next level of care: Home w Home Health Services Barriers to Discharge: Barriers Resolved   Patient Goals and CMS Choice      Discharge Placement                         Discharge Plan and Services Additional resources added to the After Visit Summary for       Post Acute Care Choice: Home Health                    HH Arranged: PT, OT [facility] Agency: [facility] Date [facility] Agency Contacted: 02/25/23 Time HH Agency Contacted: [phone] Representative spoke with at [facility] Agency: Bjorn Loser  Social Determinants of Health (SDOH) Interventions SDOH Screenings   Food Insecurity: No Food Insecurity (02/23/2023)  Housing: Low Risk  (02/23/2023)  Transportation Needs: No Transportation Needs (02/23/2023)  Utilities: Not At Risk (02/23/2023)  Alcohol Screen: Low Risk  (02/03/2021)  Depression (PHQ2-9): Low Risk  (02/20/2023)  Financial Resource Strain: Low Risk  (02/03/2021)  Physical Activity: Sufficiently Active (02/09/2022)  Social Connections: Moderately Integrated (02/09/2022)  Stress: No Stress Concern Present (02/09/2022)  Tobacco Use: Low Risk  (02/23/2023)     Readmission Risk Interventions    06/18/2020    4:21 PM  Readmission Risk Prevention Plan  Medication Screening Complete  Transportation Screening Complete

## 2023-02-26 NOTE — Plan of Care (Signed)
  Problem: Education: Goal: Knowledge of risk factors and measures for prevention of condition will improve Outcome: Progressing   Problem: Coping: Goal: Psychosocial and spiritual needs will be supported Outcome: Progressing   Problem: Respiratory: Goal: Will maintain a patent airway Outcome: Progressing   Problem: Activity: Goal: Risk for activity intolerance will decrease Outcome: Progressing

## 2023-02-26 NOTE — Discharge Summary (Signed)
[facility] Discharge Summary   Patient: Phyllis Ross WUJ:[id number]  PCP: Duanne Limerick, MD  Date of admission: 02/23/2023   Date of discharge:  02/26/2023     Discharge Diagnoses:  Principal Problem:   Hypokalemia Active Problems:   COVID-19 virus infection   Admitted From: Home Disposition:  Home with [facility]  Recommendations for Outpatient Follow-up:  PCP: in 1 wk Follow up LABS/TEST:     Follow-up Information     Duanne Limerick, MD Follow up.   Specialty: Family Medicine Why: Hospital follow up Contact information: [address] [place] [address] [phone]                Diet recommendation: Cardiac diet  Activity: The patient is advised to gradually reintroduce usual activities, as tolerated  Discharge Condition: stable  Code Status: Full code   History of present illness: As per the H and P dictated on admission Hospital Course:  Phyllis Ross is a 87 y.o. female with medical history significant of cecum CA status post right hemicolectomy in 2022, HLD, CAD CABG in 2008, presented with persistent diarrhea and generalized weakness and frequent falls.   Symptoms started last Sunday, patient started develop productive cough with whitish phlegm production, no wheezing no fever chills.  Went to see PCP on Monday, who did a office COVID study which turned positive.  Patient was started on Paxlovid and azithromycin.  Monday evening, patient started to have loose bowel movement 4-5 times a day every day for the last 3+ days, no abdominal pain, no nauseous vomiting.  She also has loss of taste during the time.  Last few days, patient developed worsening of generalized weakness and sustained several falls at home, but denied any LOC no head or neck injuries.   ED Course: Afebrile, nontachycardic, not hypotensive.  O2 saturation 93% on room air.  Chest x-ray showed no acute infiltrates.  Blood work showed K2.8, WBC 8.9, hemoglobin 11.2,  creatinine 0.7 bicarb 27.   Assessment and Plan:   # Severe hypokalemia.  Resolved -Secondary to acute GI loss from persistent diarrhea likely secondary to COVID Potassium repleted # Hypomagnesemia, mag repleted.  Resolved # COVID infection diagnosed on 10/20/2022 Loss of taste and Loose diarrhea. S/p Paxlovid x 2 days to complete a 5 days course. S/p Imodium for diarrhea and probiotics twice daily.  Diarrhea improved. # Moderate protein caloric malnutrition: Lost about 20 pounds after colon surgery 2022 and never came back. Continue Ensure supplement. Consulted dietitian # HTN and CAD: Patient is not on antiplatelet therapy, not on antihypertensive medications Blood pressure slightly elevated.  Started amlodipine 5 mg p.o. daily, patient was advised to monitor BP at home and skip the dose if systolic BP less than 140 mmHg.  Follow with PCP for further management as an outpatient. # HLD: held statin for now due to generalized weakness and Paxlovid.  Resumed on discharge. # Vitamin D deficiency: started vitamin D 50,000 units p.o. weekly, follow with PCP to repeat vitamin D level after 3 to 6 months.    Body mass index is 16.95 kg/m.  Nutrition Problem: Severe Malnutrition Etiology: chronic illness (cecum cancer s/p rt hemicolectomy) Nutrition Interventions: Interventions: Ensure Enlive (each supplement provides 350kcal and 20 grams of protein), MVI, Liberalize Diet  Patient was seen by physical therapy, who recommended Home health, which was arranged. On the day of the discharge the patient's vitals were stable, and no other acute medical condition were reported by patient.  the patient was felt safe to be discharge at Home with Home health.  Consultants: None Procedures: None  Discharge Exam: General: Appear in no distress, no Rash; Oral Mucosa Clear, moist. Cardiovascular: S1 and S2 Present, no Murmur, Respiratory: normal respiratory effort, Bilateral Air entry present and no  Crackles, no wheezes Abdomen: Bowel Sound present, Soft and no tenderness, no hernia Extremities: no Pedal edema, no calf tenderness Neurology: alert and oriented to time, place, and person affect appropriate.  Filed Weights   02/23/23 1312  Weight: 46.2 kg   Vitals:   02/26/23 0445 02/26/23 0829  BP: (!) 179/96 (!) 155/101  Pulse: 70 63  Resp: 19 18  Temp: 97.9 F (36.6 C) 98 F (36.7 C)  SpO2: 96% 96%    DISCHARGE MEDICATION: Allergies as of 02/26/2023       Reactions   Trazodone And Nefazodone Other (See Comments)   Made her pass out        Medication List     STOP taking these medications    ALPRAZolam 0.25 MG tablet Commonly known as: XANAX   azithromycin 250 MG tablet Commonly known as: ZITHROMAX   cyanocobalamin 1000 MCG tablet Commonly known as: VITAMIN B12   fluorouracil 5 % cream Commonly known as: EFUDEX   mupirocin ointment 2 % Commonly known as: BACTROBAN   nirmatrelvir/ritonavir 20 x 150 MG & 10 x 100MG  Tabs Commonly known as: PAXLOVID   VITAMIN B-12 IJ       TAKE these medications    amLODipine 5 MG tablet Commonly known as: NORVASC Take 1 tablet (5 mg total) by mouth daily. Skip the dose if systolic BP less than 140 mmHg Start taking on: February 27, 2023   atorvastatin 80 MG tablet Commonly known as: LIPITOR TAKE (1) TABLET BY MOUTH EVERY DAY   dextromethorphan 30 MG/5ML liquid Commonly known as: DELSYM Take by mouth as needed for cough.   ENSURE COMPLETE PO Take 1 Can by mouth daily.   ferrous sulfate 325 (65 FE) MG tablet Take 1 tablet (325 mg total) by mouth daily with breakfast.   hydrOXYzine 10 MG tablet Commonly known as: ATARAX Take 1 tablet (10 mg total) by mouth at bedtime as needed.   meloxicam 7.5 MG tablet Commonly known as: MOBIC TAKE (1) TABLET BY MOUTH EVERY DAY   montelukast 10 MG tablet Commonly known as: SINGULAIR TAKE (1) TABLET BY MOUTH DAILY AT BEDTIME   potassium chloride SA 20 MEQ  tablet Commonly known as: KLOR-CON M TAKE (1) TABLET BY MOUTH EVERY DAY   Vitamin D (Ergocalciferol) 1.25 MG (50000 UNIT) Caps capsule Commonly known as: DRISDOL Take 1 capsule (50,000 Units total) by mouth every 7 (seven) days. Start taking on: March 04, 2023       Allergies  Allergen Reactions   Trazodone And Nefazodone Other (See Comments)    Made her pass out   Discharge Instructions     Call MD for:  difficulty breathing, headache or visual disturbances   Complete by: As directed    Call MD for:  extreme fatigue   Complete by: As directed    Call MD for:  persistant dizziness or light-headedness   Complete by: As directed    Call MD for:  persistant nausea and vomiting   Complete by: As directed    Call MD for:  severe uncontrolled pain   Complete by: As directed    Call MD for:  temperature >100.4   Complete by: As directed  Diet - low sodium heart healthy   Complete by: As directed    Discharge instructions   Complete by: As directed    Follow-up with PCP in 1 week   Increase activity slowly   Complete by: As directed        The results of significant diagnostics from this hospitalization (including imaging, microbiology, ancillary and laboratory) are listed below for reference.    Significant Diagnostic Studies: CT Head Wo Contrast  Result Date: 02/23/2023 CLINICAL DATA:  Head trauma, minor (Age >= 65y) EXAM: CT HEAD WITHOUT CONTRAST TECHNIQUE: Contiguous axial images were obtained from the base of the skull through the vertex without intravenous contrast. RADIATION DOSE REDUCTION: This exam was performed according to the departmental dose-optimization program which includes automated exposure control, adjustment of the mA and/or kV according to patient size and/or use of iterative reconstruction technique. COMPARISON:  None Available. FINDINGS: Brain: No hemorrhage. No hydrocephalus. No extra-axial fluid collection. No CT evidence of an acute cortical  infarct. No mass effect. No mass lesion. There is mineralization of the basal ganglia bilaterally. Vascular: No hyperdense vessel or unexpected calcification. Skull: Normal. Negative for fracture or focal lesion. Sinuses/Orbits: No middle ear or mastoid effusion. Paranasal sinuses are clear. Orbits are notable bilateral lens replacement, otherwise unremarkable. Other: None. IMPRESSION: No CT evidence of intracranial injury. Electronically Signed   By: Lorenza Cambridge M.D.   On: 02/23/2023 13:53   DG Chest 2 View  Result Date: 02/20/2023 CLINICAL DATA:  Cough EXAM: CHEST - 2 VIEW COMPARISON:  10/18/2022 FINDINGS: The heart size and mediastinal contours are within normal limits. Prior sternotomy and CABG. Chronic biapical pleuroparenchymal scarring. Small area of airspace opacity in the right upper lobe may be related to scarring versus developing infection. No pleural effusion or pneumothorax. The visualized skeletal structures are unremarkable. IMPRESSION: Small area of airspace opacity in the right upper lobe may be related to scarring versus developing infection. Electronically Signed   By: Duanne Guess D.O.   On: 02/20/2023 18:34    Microbiology: No results found for this or any previous visit (from the past 240 hour(s)).   Labs: CBC: Recent Labs  Lab 02/23/23 1345 02/24/23 0601 02/25/23 0444  WBC 8.9 7.7 9.1  NEUTROABS 7.2  --   --   HGB 11.2* 10.9* 11.5*  HCT 34.3* 33.5* 35.5*  MCV 66.5* 66.2* 67.4*  PLT 167 166 190   Basic Metabolic Panel: Recent Labs  Lab 02/20/23 1514 02/23/23 1345 02/23/23 2042 02/24/23 0601 02/25/23 0444  NA 134* 132*  --  135 134*  K 3.3* 2.8* 3.8 3.6 3.9  CL 94* 93*  --  96* 91*  CO2 29 27  --  30 34*  GLUCOSE 119* 107*  --  90 96  BUN 21 16  --  10 11  CREATININE 0.78 0.78  --  0.65 0.91  CALCIUM 8.7* 8.6*  --  8.4* 8.1*  MG  --   --  1.3*  --  1.9  PHOS 3.8  --   --  2.8 2.9   Liver Function Tests: Recent Labs  Lab 02/20/23 1514  ALBUMIN  3.4*   No results for input(s): "LIPASE", "AMYLASE" in the last 168 hours. No results for input(s): "AMMONIA" in the last 168 hours. Cardiac Enzymes: Recent Labs  Lab 02/24/23 0747 02/25/23 0444  CKTOTAL 46 74   BNP (last 3 results) No results for input(s): "BNP" in the last 8760 hours. CBG: Recent Labs  Lab 02/24/23 0750  02/25/23 0821  GLUCAP 91 98    Time spent: 35 minutes  Signed:  Gillis Santa  [facility] 02/26/2023 11:06 AM

## 2023-02-26 NOTE — Progress Notes (Signed)
Patient stated that she did not want the flu vaccine or PNA vaccine.

## 2023-02-27 ENCOUNTER — Telehealth: Payer: Self-pay

## 2023-02-27 NOTE — Transitions of Care (Post Inpatient/ED Visit) (Signed)
   02/27/2023  Name: Phyllis Ross MRN: [id number] DOB: Jan 18, 1936  Today's TOC FU Call Status: Today's TOC FU Call Status:: Successful TOC FU Call Completed TOC FU Call Complete Date: 02/27/23 Patient's Name and Date of Birth confirmed.  Transition Care Management Follow-up Telephone Call Date of Discharge: 02/26/23 Discharge Facility: Other [facility]) Name of Other (Non[facility]) Discharge Facility: [facility] Type of Discharge: Inpatient Admission Primary Inpatient Discharge Diagnosis:: COVId How have you been since you were released from the hospital?: Better Any questions or concerns?: No  Items Reviewed: Did you receive and understand the discharge instructions provided?: Yes Medications obtained,verified, and reconciled?: Yes (Medications Reviewed) Any new allergies since your discharge?: No Dietary orders reviewed?: Yes Do you have support at home?: Yes People in Home: child(ren), adult  Medications Reviewed Today: Medications Reviewed Today     Reviewed by Karena Addison, LPN (Licensed Practical Nurse) on 02/27/23 at 1159  Med List Status: <None>   Medication Order Taking? Sig Documenting Provider Last Dose Status Informant  amLODipine (NORVASC) 5 MG tablet [id number]  Take 1 tablet (5 mg total) by mouth daily. Skip the dose if systolic BP less than 140 mmHg Gillis Santa, MD  Active   atorvastatin (LIPITOR) 80 MG tablet [id number] No TAKE (1) TABLET BY MOUTH EVERY DAY Duanne Limerick, MD 02/22/2023 Active Self  dextromethorphan (DELSYM) 30 MG/5ML liquid [id number] No Take by mouth as needed for cough. [provider] prn unknown Active Self  ferrous sulfate 325 (65 FE) MG tablet [id number] No Take 1 tablet (325 mg total) by mouth daily with breakfast. Duanne Limerick, MD 02/23/2023 Active Self  hydrOXYzine (ATARAX) 10 MG tablet [id number] No Take 1 tablet (10 mg total) by mouth at bedtime as needed. Duanne Limerick, MD 02/22/2023 Active Self  meloxicam  (MOBIC) 7.5 MG tablet [id number] No TAKE (1) TABLET BY MOUTH EVERY DAY Duanne Limerick, MD 02/23/2023 Active Self  montelukast (SINGULAIR) 10 MG tablet [id number] No TAKE (1) TABLET BY MOUTH DAILY AT BEDTIME Duanne Limerick, MD 02/22/2023 Active Self  Nutritional Supplements (ENSURE COMPLETE PO) [id number] No Take 1 Can by mouth daily. [provider] Taking Active Self  potassium chloride SA (KLOR-CON M) 20 MEQ tablet [id number] No TAKE (1) TABLET BY MOUTH EVERY DAY Elizabeth Sauer C, MD prn unknown Active Self  Vitamin D, Ergocalciferol, (DRISDOL) 1.25 MG (50000 UNIT) CAPS capsule [id number]  Take 1 capsule (50,000 Units total) by mouth every 7 (seven) days. Gillis Santa, MD  Active   Med List Note Remi Haggard, RPH-CPP 09/18/20 1505): Xeloda filled at [facility]            Home Care and Equipment/Supplies: Were Home Health Services Ordered?: NA Any new equipment or medical supplies ordered?: NA  Functional Questionnaire: Do you need assistance with bathing/showering or dressing?: No Do you need assistance with meal preparation?: No Do you need assistance with eating?: No Do you have difficulty maintaining continence: No Do you need assistance with getting out of bed/getting out of a chair/moving?: No Do you have difficulty managing or taking your medications?: No  Follow up appointments reviewed: PCP Follow-up appointment confirmed?: Yes Date of PCP follow-up appointment?: 02/28/23 Follow-up Provider: [facility] Follow-up appointment confirmed?: NA Do you need transportation to your follow-up appointment?: No Do you understand care options if your condition(s) worsen?: Yes-patient verbalized understanding    SIGNATURE Karena Addison, LPN [facility] Nurse Health Advisor Direct Dial [phone]

## 2023-02-28 ENCOUNTER — Encounter: Payer: Self-pay | Admitting: Family Medicine

## 2023-02-28 ENCOUNTER — Ambulatory Visit: Payer: Medicare PPO | Admitting: Family Medicine

## 2023-02-28 ENCOUNTER — Telehealth: Payer: Self-pay | Admitting: *Deleted

## 2023-02-28 VITALS — BP 112/58 | HR 71 | Ht 65.0 in | Wt 97.0 lb

## 2023-02-28 DIAGNOSIS — E860 Dehydration: Secondary | ICD-10-CM

## 2023-02-28 DIAGNOSIS — U099 Post covid-19 condition, unspecified: Secondary | ICD-10-CM | POA: Diagnosis not present

## 2023-02-28 DIAGNOSIS — I10 Essential (primary) hypertension: Secondary | ICD-10-CM

## 2023-02-28 DIAGNOSIS — R053 Chronic cough: Secondary | ICD-10-CM | POA: Diagnosis not present

## 2023-02-28 DIAGNOSIS — E612 Magnesium deficiency: Secondary | ICD-10-CM | POA: Diagnosis not present

## 2023-02-28 DIAGNOSIS — E876 Hypokalemia: Secondary | ICD-10-CM

## 2023-02-28 NOTE — Patient Outreach (Signed)
  Care Coordination   02/28/2023 Name: Phyllis Ross MRN: [id number] DOB: March 06, 1936   Care Coordination Outreach Attempts:  An unsuccessful telephone outreach was attempted today to offer the patient information about available care coordination services.  Follow Up Plan:  Additional outreach attempts will be made to offer the patient care coordination information and services.   Encounter Outcome:  No Answer   Care Coordination Interventions:  No, not indicated    Rodney Langton, RN, MSN, CCM [place]  [facility], [facility] RN Care Coordinator Direct Dial: [phone] / Main [phone] Fax [phone] Email: [email or web address]

## 2023-02-28 NOTE — Patient Outreach (Signed)
  Care Coordination   Follow Up Visit Note   02/28/2023 Name: Phyllis Ross MRN: [id number] DOB: 11-25-1935  Phyllis Ross is a 87 y.o. year old female who sees Duanne Limerick, MD for primary care. I spoke with Judeth Cornfield, daughter of Phyllis Ross by phone today.  What matters to the patients health and wellness today?  Discharged home from hospital on 11/24 after admitted with Covid, daughter was provided care, now she has Covid as well.  Son helping to care for patient.  Denies any urgent concerns, encouraged to contact this care manager with questions.      Goals Addressed             This Visit's Progress    Effective management of health conditions       Interventions Today    Flowsheet Row Most Recent Value  Chronic Disease   Chronic disease during today's visit Other  [Recently admitted to hospital for Covid]  General Interventions   General Interventions Discussed/Reviewed General Interventions Reviewed, Doctor Visits, Level of Care  Doctor Visits Discussed/Reviewed Doctor Visits Reviewed, PCP  [follow up with PCP 12/10]  PCP/Specialist Visits Compliance with follow-up visit  [hospital follow up with PCP today]  Level of Care Personal Care Services  [Working with [facility] to secure in home aide services]  Exercise Interventions   Exercise Discussed/Reviewed Physical Activity  Physical Activity Discussed/Reviewed Physical Activity Reviewed  [discussed orders for [facility]PT, [facility] to start on Monday]  Education Interventions   Education Provided Provided Education  Provided Verbal Education On Nutrition, Medication, When to see the doctor  [Daughter aware of signs/symptoms of worsening condition, will seek medical attention if needed]  Nutrition Interventions   Nutrition Discussed/Reviewed Nutrition Reviewed, Supplemental nutrition  [daughter report decreased appetite, encouraged to drink Ensure]              SDOH assessments and interventions  completed:  No     Care Coordination Interventions:  Yes, provided   Follow up plan: Follow up call scheduled for 12/3    Encounter Outcome:  Patient Visit Completed   Rodney Langton, RN, MSN, CCM [place]  [facility], [facility] RN Care Coordinator Direct Dial: [phone] / Main [phone] Fax [phone] Email: [email or web address]: [email or web address]

## 2023-02-28 NOTE — Progress Notes (Signed)
Date:  02/28/2023   Name:  Phyllis Ross   DOB:  [birth year]   MRN:  [id number]   Chief Complaint: Hospitalization Follow-up  Follow up Hospitalization  Patient was admitted to [facility] on 11/21 and discharged on 11/24. She was treated for covid-19 and hypokalemia. Treatment for this included paxlovid. Telephone follow up was done on 11/25 She reports good compliance with treatment. She reports this condition is improved.  ----------------------------------------------------------------------------------------- -     Cough This is a chronic problem. The current episode started more than 1 year ago. The problem has been gradually improving. The problem occurs hourly. The cough is Productive of sputum. Pertinent negatives include no chest pain, chills, fever, headaches, hemoptysis, myalgias, nasal congestion, postnasal drip, rhinorrhea, sore throat, shortness of breath or wheezing. Nothing aggravates the symptoms. The treatment provided mild relief.    Lab Results  Component Value Date   NA 134 (L) 02/25/2023   K 3.9 02/25/2023   CO2 34 (H) 02/25/2023   GLUCOSE 96 02/25/2023   BUN 11 02/25/2023   CREATININE 0.91 02/25/2023   CALCIUM 8.1 (L) 02/25/2023   EGFR 59 (L) 12/26/2022   GFRNONAA >60 02/25/2023   Lab Results  Component Value Date   CHOL 125 10/10/2022   HDL 70 10/10/2022   LDLCALC 37 10/10/2022   TRIG 94 10/10/2022   CHOLHDL 2.7 10/07/2020   Lab Results  Component Value Date   TSH 2.114 02/23/2023   Lab Results  Component Value Date   HGBA1C 5.5 04/07/2020   Lab Results  Component Value Date   WBC 9.1 02/25/2023   HGB 11.5 (L) 02/25/2023   HCT 35.5 (L) 02/25/2023   MCV 67.4 (L) 02/25/2023   PLT 190 02/25/2023   Lab Results  Component Value Date   ALT 41 (H) 12/26/2022   AST 35 12/26/2022   GGT 12 12/26/2022   ALKPHOS 79 12/26/2022   BILITOT 1.1 12/26/2022   Lab Results  Component Value Date   VD25OH 15.84 (L) 02/24/2023      Review of Systems  Constitutional:  Positive for fatigue. Negative for chills and fever.  HENT:  Negative for nosebleeds, postnasal drip, rhinorrhea and sore throat.   Respiratory:  Positive for cough. Negative for hemoptysis, chest tightness, shortness of breath and wheezing.   Cardiovascular:  Negative for chest pain, palpitations and leg swelling.  Gastrointestinal:  Negative for blood in stool.  Musculoskeletal:  Negative for myalgias.  Neurological:  Negative for headaches.    Patient Active Problem List   Diagnosis Date Noted   Hypokalemia 02/23/2023   COVID-19 virus infection 02/23/2023   Primary osteoarthritis of right hip 07/24/2022   History of colon cancer    Polyp of transverse colon    B12 deficiency 09/16/2020   Goals of care, counseling/discussion 08/07/2020   Microcytic anemia 07/23/2020   Status post partial colectomy 07/02/2020   Protein-calorie malnutrition, severe 06/24/2020   Cecal cancer (HCC) 06/15/2020   Iron deficiency anemia due to chronic blood loss    Neoplasm of lower gastrointestinal tract    Acute gastritis with hemorrhage    Personal history of other malignant neoplasm of skin 01/21/2019   Atherosclerosis of abdominal aorta (HCC) 12/26/2017   Age-related osteoporosis without current pathological fracture 06/16/2016   Encounter for follow-up surveillance of ovarian cancer 05/20/2015   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   Wedging of vertebra (HCC) 10/13/2014   Polypharmacy 10/13/2014   Encounter for general adult medical examination without abnormal findings  10/13/2014   Anxiety 10/13/2014   Essential hypertension 08/27/2014   Bradycardia 08/27/2014   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) 08/27/2014   MI (mitral incompetence) 08/27/2014   TI (tricuspid incompetence) 08/27/2014   Combined fat and carbohydrate induced hyperlipemia 08/13/2014   HTN (hypertension), malignant 08/11/2014    Chest pain 08/11/2014   CAD (coronary artery disease) of artery bypass graft 08/11/2014    Allergies  Allergen Reactions   Trazodone And Nefazodone Other (See Comments)    Made her pass out    Past Surgical History:  Procedure Laterality Date   COLONOSCOPY  04/04/2009   normal- Dr Mechele Collin   COLONOSCOPY WITH PROPOFOL N/A 06/04/2020   Procedure: COLONOSCOPY WITH PROPOFOL;  Surgeon: Midge Minium, MD;  Location: [facility];  Service: Endoscopy;  Laterality: N/A;   COLONOSCOPY WITH PROPOFOL N/A 01/31/2022   Procedure: COLONOSCOPY WITH PROPOFOL;  Surgeon: Midge Minium, MD;  Location: [facility];  Service: Endoscopy;  Laterality: N/A;   CORONARY ARTERY BYPASS GRAFT N/A 08/14/2006   Procedure: CORONARY ARTERY BYPASS GRAFT; Location: [facility]; Surgeon: Tilford Pillar, MD   CYSTOSCOPY WITH RETROGRADE PYELOGRAM, URETEROSCOPY AND STENT PLACEMENT Right 11/12/2021   Procedure: CYSTOSCOPY WITH RETROGRADE PYELOGRAM / RIGHT URETEROSCOPY/HOLMIUM LASER/RIGHT STENT EXCHANGE;  Surgeon: Sondra Come, MD;  Location: [facility];  Service: Urology;  Laterality: Right;   CYSTOSCOPY/URETEROSCOPY/HOLMIUM LASER/STENT PLACEMENT Right 10/29/2021   Procedure: CYSTOSCOPY/ RIGHT STENT PLACEMENT;  Surgeon: Sondra Come, MD;  Location: [facility];  Service: Urology;  Laterality: Right;   ESOPHAGOGASTRODUODENOSCOPY (EGD) WITH PROPOFOL N/A 06/04/2020   Procedure: ESOPHAGOGASTRODUODENOSCOPY (EGD) WITH PROPOFOL;  Surgeon: Midge Minium, MD;  Location: [facility];  Service: Endoscopy;  Laterality: N/A;   FRACTURE SURGERY     ankle left   LEFT HEART CATH AND CORONARY ANGIOGRAPHY Left 08/14/2006   Procedure: LEFT HEART CATHETERIZATION AND CORONARY ANGIOGRAPHY; Location: [facility]: Surgeon: Rudean Hitt, MD   VAGINAL HYSTERECTOMY     DUB    Social History   Tobacco Use   Smoking status: Never    Passive exposure: Never   Smokeless tobacco: Never  Vaping Use   Vaping status: Never Used  Substance  Use Topics   Alcohol use: No   Drug use: No     Medication list has been reviewed and updated.  Current Meds  Medication Sig   amLODipine (NORVASC) 5 MG tablet Take 1 tablet (5 mg total) by mouth daily. Skip the dose if systolic BP less than 140 mmHg   atorvastatin (LIPITOR) 80 MG tablet TAKE (1) TABLET BY MOUTH EVERY DAY   ferrous sulfate 325 (65 FE) MG tablet Take 1 tablet (325 mg total) by mouth daily with breakfast.   hydrOXYzine (ATARAX) 10 MG tablet Take 1 tablet (10 mg total) by mouth at bedtime as needed.   meloxicam (MOBIC) 7.5 MG tablet TAKE (1) TABLET BY MOUTH EVERY DAY   montelukast (SINGULAIR) 10 MG tablet TAKE (1) TABLET BY MOUTH DAILY AT BEDTIME   Nutritional Supplements (ENSURE COMPLETE PO) Take 1 Can by mouth daily.   potassium chloride SA (KLOR-CON M) 20 MEQ tablet TAKE (1) TABLET BY MOUTH EVERY DAY   [START ON 03/04/2023] Vitamin D, Ergocalciferol, (DRISDOL) 1.25 MG (50000 UNIT) CAPS capsule Take 1 capsule (50,000 Units total) by mouth every 7 (seven) days.       02/28/2023    1:27 PM 10/10/2022    9:21 AM 10/10/2022    9:19 AM 08/22/2022    4:24 PM  GAD 7 : Generalized Anxiety Score  Nervous, Anxious, on Edge 0 0 0 0  Control/stop worrying 0 0 0 0  Worry too much - different things 0 0 0 0  Trouble relaxing 0 0 0 0  Restless 0 0 0 0  Easily annoyed or irritable 0 0 0 0  Afraid - awful might happen 0 0 0 0  Total GAD 7 Score 0 0 0 0  Anxiety Difficulty Not difficult at all Not difficult at all Not difficult at all Not difficult at all       02/28/2023    1:27 PM 02/20/2023    1:44 PM 10/10/2022    9:21 AM  Depression screen PHQ 2/9  Decreased Interest 0 0 0  Down, Depressed, Hopeless 0 0 0  PHQ - 2 Score 0 0 0  Altered sleeping 0  0  Tired, decreased energy 3  0  Change in appetite 2  0  Feeling bad or failure about yourself  0  0  Trouble concentrating 0  0  Moving slowly or fidgety/restless 0  0  Suicidal thoughts 0  0  PHQ-9 Score 5  0  Difficult  doing work/chores Not difficult at all  Not difficult at all    BP Readings from Last 3 Encounters:  02/28/23 (!) 112/58  02/26/23 (!) 155/101  02/20/23 102/68    Physical Exam Vitals and nursing note reviewed.  HENT:     Head: Normocephalic.     Right Ear: Tympanic membrane, ear canal and external ear normal. There is no impacted cerumen.     Left Ear: Tympanic membrane, ear canal and external ear normal. There is no impacted cerumen.     Nose: No congestion or rhinorrhea.     Mouth/Throat:     Mouth: Mucous membranes are dry.  Cardiovascular:     Rate and Rhythm: Normal rate and regular rhythm.  Musculoskeletal:     Cervical back: Neck supple.  Neurological:     Mental Status: She is alert.     Wt Readings from Last 3 Encounters:  02/28/23 97 lb (44 kg)  02/23/23 101 lb 13.6 oz (46.2 kg)  02/20/23 102 lb (46.3 kg)    BP (!) 112/58   Pulse 71   Ht 5\' 5"  (1.651 m)   Wt 97 lb (44 kg)   SpO2 98%   BMI 16.14 kg/m   [facility] PRIMARY CARE & SPORTS MEDICINE AT [facility]                                   Transitional [facility] Clinic   [facility] Discharge Acute Issues Care Follow Up                                                                        Patient Demographics  RICKISHA PRIMACK, is a 87 y.o. female  DOB [birth year]  MRN [id number].  Primary MD  Duanne Limerick, MD   Reason for TCC follow Up -follow up to monitor correction of dehydration and hypokalemia.  Patient is gradually improving but is still relatively weak.  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   a.) stage IIIc (G2, (+) LVI, (-) PNI, T3N3BMX) --> s/p RIGHT hemicolectomy + oral capecitabine   Anemia    Anxiety    a.) on BZO (alprazolam) PRN   Aortic atherosclerosis (HCC)    Arthritis    Beta thalassemia (HCC)    Bradycardia     CAD (coronary artery disease) 08/14/2006   a.) STEMI 08/14/2006 --> LHC at [facility] --> EF 45%, 95% mLAD, 95% OM1, and 75% LV groove artery --> transfer to [facility] for CVTS consult; b.) 3v CABG 08/14/2006   Chronic kidney disease    History of kidney stones    Hyperlipidemia    Hypertension    Osteoporosis    Ovarian abscess    Ovarian cancer (HCC) 2005   a.) stage IIIb adenocarcinoma --> s/p CRS 2005 + 6 cycles of carboplatin/paclitaxel   Pneumonia    as a baby   Rheumatic fever    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   a.) LIMA-LAD, SVG-RI, SVG-OM1   Skin cancer    STEMI (ST elevation myocardial infarction) (HCC) 08/14/2006   a.) LHC at [facility] --> transferred to [facility] for emergent CABG    Past Surgical History:  Procedure Laterality Date   COLONOSCOPY  04/04/2009   normal- Dr Mechele Collin   COLONOSCOPY WITH PROPOFOL N/A 06/04/2020   Procedure: COLONOSCOPY WITH PROPOFOL;  Surgeon: Midge Minium, MD;  Location: [facility];  Service: Endoscopy;  Laterality: N/A;   COLONOSCOPY WITH PROPOFOL N/A 01/31/2022   Procedure: COLONOSCOPY WITH PROPOFOL;  Surgeon: Midge Minium, MD;  Location: [facility];  Service: Endoscopy;  Laterality: N/A;   CORONARY ARTERY BYPASS GRAFT N/A 08/14/2006   Procedure: CORONARY ARTERY BYPASS GRAFT; Location: [facility]; Surgeon: Tilford Pillar, MD   CYSTOSCOPY WITH RETROGRADE PYELOGRAM, URETEROSCOPY AND STENT PLACEMENT Right 11/12/2021   Procedure: CYSTOSCOPY WITH RETROGRADE PYELOGRAM / RIGHT URETEROSCOPY/HOLMIUM LASER/RIGHT STENT EXCHANGE;  Surgeon: Sondra Come, MD;  Location: [facility];  Service: Urology;  Laterality: Right;   CYSTOSCOPY/URETEROSCOPY/HOLMIUM LASER/STENT PLACEMENT Right 10/29/2021   Procedure: CYSTOSCOPY/ RIGHT STENT PLACEMENT;  Surgeon: Sondra Come, MD;  Location: [facility];  Service: Urology;  Laterality: Right;   ESOPHAGOGASTRODUODENOSCOPY (EGD) WITH PROPOFOL N/A 06/04/2020   Procedure: ESOPHAGOGASTRODUODENOSCOPY (EGD) WITH PROPOFOL;  Surgeon: Midge Minium, MD;  Location: [facility];  Service: Endoscopy;  Laterality: N/A;   FRACTURE SURGERY     ankle left   LEFT HEART CATH AND CORONARY ANGIOGRAPHY Left 08/14/2006   Procedure: LEFT HEART CATHETERIZATION AND CORONARY ANGIOGRAPHY; Location: [facility]: Surgeon: Rudean Hitt, MD   VAGINAL HYSTERECTOMY     DUB   Recent HPI and Hospital course patient did sustain COVID infection and also was sent suggestion of a pneumonia that azithromycin was involved.  Patient developed some diarrhea likely from the COVID which contributed to hypokalemia and ultimately dehydration and hospitalization.  Patient is gradually been improving with home hydration and care.  Post Hospital acute care issue to be followed in clinic patient did have elevated blood pressure at which her amlodipine was resumed and blood pressure has returned to normal patient also is getting correction of her potassium and we will further look at her magnesium as well to see if that continues to improve as well.    Subjective:   Shareda Preacher today has, No headache, No chest pain, No abdominal  pain - No Nausea, No new weakness tingling or numbness, No Cough - SOB..  Patient does have a nonproductive cough but no fever or chills.  She is relatively weak but has cautionary improvement with hydration and family management.  Assessment & Plan    1. Post-COVID chronic cough Status post COVID after treatment with Paxlovid she is doing well and continues to improve on a day by day basis.  Continue to monitor weights and home health will be involved soon to help with her overall care.  2. Dehydration Patient continues to improve with hydration and encouragement of food intake.  Caloric content is gradually increased and we will check renal function for GFR and electrolyte concerns. - Renal Function Panel  3. Hypokalemia Patient had a significantly decreased potassium but this was when she was having uncontrolled diarrhea and also  not taking current hydration that she is having now.  We will continue to monitor electrolytes with renal function today. - Renal Function Panel  4. Magnesium deficiency Gradually improvement of magnesium deficiency with supplementation and will recheck patient in 2 weeks    Reason for frequent admissions/ER visits patient was having falls but now she is gradually regaining her strength with hydration and food intake we will continue to encourage a gradual return to her daily activities.      Objective:   Vitals:   02/28/23 1319  BP: (!) 112/58  Pulse: 71  SpO2: 98%  Weight: 97 lb (44 kg)  Height: 5\' 5"  (1.651 m)    Wt Readings from Last 3 Encounters:  02/28/23 97 lb (44 kg)  02/23/23 101 lb 13.6 oz (46.2 kg)  02/20/23 102 lb (46.3 kg)    Allergies as of 02/28/2023       Reactions   Trazodone And Nefazodone Other (See Comments)   Made her pass out        Medication List        Accurate as of February 28, 2023  1:55 PM. If you have any questions, ask your nurse or doctor.          STOP taking these medications    dextromethorphan 30 MG/5ML liquid Commonly known as: DELSYM Stopped by: Elizabeth Sauer       TAKE these medications    amLODipine 5 MG tablet Commonly known as: NORVASC Take 1 tablet (5 mg total) by mouth daily. Skip the dose if systolic BP less than 140 mmHg   atorvastatin 80 MG tablet Commonly known as: LIPITOR TAKE (1) TABLET BY MOUTH EVERY DAY   ENSURE COMPLETE PO Take 1 Can by mouth daily.   ferrous sulfate 325 (65 FE) MG tablet Take 1 tablet (325 mg total) by mouth daily with breakfast.   hydrOXYzine 10 MG tablet Commonly known as: ATARAX Take 1 tablet (10 mg total) by mouth at bedtime as needed.   meloxicam 7.5 MG tablet Commonly known as: MOBIC TAKE (1) TABLET BY MOUTH EVERY DAY   montelukast 10 MG tablet Commonly known as: SINGULAIR TAKE (1) TABLET BY MOUTH DAILY AT BEDTIME   potassium chloride SA 20 MEQ  tablet Commonly known as: KLOR-CON M TAKE (1) TABLET BY MOUTH EVERY DAY   Vitamin D (Ergocalciferol) 1.25 MG (50000 UNIT) Caps capsule Commonly known as: DRISDOL Take 1 capsule (50,000 Units total) by mouth every 7 (seven) days. Start taking on: March 04, 2023         Physical Exam: Constitutional: Patient appears well-developed and well-nourished. Not in obvious distress. HENT: Normocephalic,  atraumatic, External right and left ear normal. Oropharynx is clear and moist.  Eyes: Conjunctivae and EOM are normal. PERRLA, no scleral icterus. Neck: Normal ROM. Neck supple. No JVD. No tracheal deviation. No thyromegaly. CVS: RRR, S1/S2 +, no murmurs, no gallops, no carotid bruit.  Pulmonary: Effort and breath sounds normal, no stridor, rhonchi, wheezes, rales.  Abdominal: Soft. BS +, no distension, tenderness, rebound or guarding.  Musculoskeletal: Normal range of motion. No edema and no tenderness.  Lymphadenopathy: No lymphadenopathy noted, cervical, inguinal or axillary Neuro: Alert. Normal reflexes, muscle tone coordination. No cranial nerve deficit. Skin: Skin is warm and dry. No rash noted. Not diaphoretic. No erythema. No pallor. Psychiatric: Normal mood and affect. Behavior, judgment, thought content normal.   Data Review   Micro Results No results found for this or any previous visit (from the past 240 hour(s)).   CBC Recent Labs  Lab 02/23/23 1345 02/24/23 0601 02/25/23 0444  WBC 8.9 7.7 9.1  HGB 11.2* 10.9* 11.5*  HCT 34.3* 33.5* 35.5*  PLT 167 166 190  MCV 66.5* 66.2* 67.4*  MCH 21.7* 21.5* 21.8*  MCHC 32.7 32.5 32.4  RDW 14.6 14.6 14.6  LYMPHSABS 0.9  --   --   MONOABS 0.7  --   --   EOSABS 0.0  --   --   BASOSABS 0.0  --   --     Chemistries  Recent Labs  Lab 02/23/23 1345 02/23/23 2042 02/24/23 0601 02/25/23 0444  NA 132*  --  135 134*  K 2.8* 3.8 3.6 3.9  CL 93*  --  96* 91*  CO2 27  --  30 34*  GLUCOSE 107*  --  90 96  BUN 16  --  10 11   CREATININE 0.78  --  0.65 0.91  CALCIUM 8.6*  --  8.4* 8.1*  MG  --  1.3*  --  1.9   ------------------------------------------------------------------------------------------------------------------ estimated creatinine clearance is 31.4 mL/min (by C-G formula based on SCr of 0.91 mg/dL). ------------------------------------------------------------------------------------------------------------------ No results for input(s): "HGBA1C" in the last 72 hours. ------------------------------------------------------------------------------------------------------------------ No results for input(s): "CHOL", "HDL", "LDLCALC", "TRIG", "CHOLHDL", "LDLDIRECT" in the last 72 hours. ------------------------------------------------------------------------------------------------------------------ No results for input(s): "TSH", "T4TOTAL", "T3FREE", "THYROIDAB" in the last 72 hours.  Invalid input(s): "FREET3" ------------------------------------------------------------------------------------------------------------------ No results for input(s): "VITAMINB12", "FOLATE", "FERRITIN", "TIBC", "IRON", "RETICCTPCT" in the last 72 hours.  Coagulation profile No results for input(s): "INR", "PROTIME" in the last 168 hours.  No results for input(s): "DDIMER" in the last 72 hours.  Cardiac Enzymes No results for input(s): "CKMB", "TROPONINI", "MYOGLOBIN" in the last 168 hours.  Invalid input(s): "CK" ------------------------------------------------------------------------------------------------------------------ Invalid input(s): "POCBNP" Time spent in minutes 30     Elizabeth Sauer M.D on 02/28/2023 at 1:55 PM   Disclaimer: This note may have been dictated with voice recognition software. Similar sounding words can inadvertently be transcribed and this note may contain transcription errors which may not have been corrected upon publication of note.      Elizabeth Sauer, MD

## 2023-03-01 LAB — RENAL FUNCTION PANEL
Albumin: 3.9 g/dL (ref 3.7–4.7)
BUN/Creatinine Ratio: 32 — ABNORMAL HIGH (ref 12–28)
BUN: 27 mg/dL (ref 8–27)
CO2: 30 mmol/L — ABNORMAL HIGH (ref 20–29)
Calcium: 9.4 mg/dL (ref 8.7–10.3)
Chloride: 88 mmol/L — ABNORMAL LOW (ref 96–106)
Creatinine, Ser: 0.84 mg/dL (ref 0.57–1.00)
Glucose: 110 mg/dL — ABNORMAL HIGH (ref 70–99)
Phosphorus: 3.5 mg/dL (ref 3.0–4.3)
Potassium: 3.4 mmol/L — ABNORMAL LOW (ref 3.5–5.2)
Sodium: 133 mmol/L — ABNORMAL LOW (ref 134–144)
eGFR: 68 mL/min/{1.73_m2} (ref >59)

## 2023-03-01 LAB — MAGNESIUM: Magnesium: 1.6 mg/dL (ref 1.6–2.3)

## 2023-03-07 ENCOUNTER — Ambulatory Visit: Payer: Self-pay | Admitting: *Deleted

## 2023-03-07 DIAGNOSIS — I2510 Atherosclerotic heart disease of native coronary artery without angina pectoris: Secondary | ICD-10-CM | POA: Diagnosis not present

## 2023-03-07 DIAGNOSIS — I700 Atherosclerosis of aorta: Secondary | ICD-10-CM | POA: Diagnosis not present

## 2023-03-07 DIAGNOSIS — U071 COVID-19: Secondary | ICD-10-CM | POA: Diagnosis not present

## 2023-03-07 DIAGNOSIS — E440 Moderate protein-calorie malnutrition: Secondary | ICD-10-CM | POA: Diagnosis not present

## 2023-03-07 DIAGNOSIS — D561 Beta thalassemia: Secondary | ICD-10-CM | POA: Diagnosis not present

## 2023-03-07 DIAGNOSIS — D631 Anemia in chronic kidney disease: Secondary | ICD-10-CM | POA: Diagnosis not present

## 2023-03-07 DIAGNOSIS — N189 Chronic kidney disease, unspecified: Secondary | ICD-10-CM | POA: Diagnosis not present

## 2023-03-07 DIAGNOSIS — I129 Hypertensive chronic kidney disease with stage 1 through stage 4 chronic kidney disease, or unspecified chronic kidney disease: Secondary | ICD-10-CM | POA: Diagnosis not present

## 2023-03-07 DIAGNOSIS — F419 Anxiety disorder, unspecified: Secondary | ICD-10-CM | POA: Diagnosis not present

## 2023-03-07 NOTE — Patient Outreach (Signed)
  Care Coordination   Follow Up Visit Note   03/07/2023 Name: Phyllis Ross MRN: [id number] DOB: May 20, 1935  Phyllis Ross is a 87 y.o. year old female who sees Duanne Limerick, MD for primary care. I spoke with Judeth Cornfield, daughter of Phyllis Ross by phone today.  What matters to the patients health and wellness today?  Per daughter, working to continue improvement since hospital discharge.      Goals Addressed             This Visit's Progress    Effective management of health conditions   On track    Interventions Today    Flowsheet Row Most Recent Value  Chronic Disease   Chronic disease during today's visit Other  [HTN, CAD, covid recovery]  General Interventions   General Interventions Discussed/Reviewed General Interventions Reviewed, Doctor Visits, Level of Care, Durable Medical Equipment (DME), Communication with  Doctor Visits Discussed/Reviewed Doctor Visits Reviewed, PCP  [PCP on 12/10]  Durable Medical Equipment (DME) Wheelchair  Wheelchair Standard  [Daughter feels patient would benefit from having a rollator to use when she is out]  PCP/Specialist Visits Compliance with follow-up visit  Communication with PCP/Specialists  [facility] sent to PCP office regarding request for rollator]  Level of Care Personal Care Services  [Patient will now have 24/7 aide coverage in the home for the next several weeks]  Education Interventions   Education Provided Provided Education  Provided Verbal Education On Nutrition, Medication, When to see the doctor  [Daughter report increased appetite, now active with [facility] for PT]              SDOH assessments and interventions completed:  No     Care Coordination Interventions:  Yes, provided   Follow up plan: Follow up call scheduled for 12/17    Encounter Outcome:  Patient Visit Completed   Rodney Langton, RN, MSN, CCM [place]  [facility], [facility] RN Care  Coordinator Direct Dial: [phone] / Main [phone] Fax [phone] Email: [email or web address]: [email or web address]

## 2023-03-10 DIAGNOSIS — D631 Anemia in chronic kidney disease: Secondary | ICD-10-CM | POA: Diagnosis not present

## 2023-03-10 DIAGNOSIS — I2510 Atherosclerotic heart disease of native coronary artery without angina pectoris: Secondary | ICD-10-CM | POA: Diagnosis not present

## 2023-03-10 DIAGNOSIS — F419 Anxiety disorder, unspecified: Secondary | ICD-10-CM | POA: Diagnosis not present

## 2023-03-10 DIAGNOSIS — I129 Hypertensive chronic kidney disease with stage 1 through stage 4 chronic kidney disease, or unspecified chronic kidney disease: Secondary | ICD-10-CM | POA: Diagnosis not present

## 2023-03-10 DIAGNOSIS — N189 Chronic kidney disease, unspecified: Secondary | ICD-10-CM | POA: Diagnosis not present

## 2023-03-10 DIAGNOSIS — E440 Moderate protein-calorie malnutrition: Secondary | ICD-10-CM | POA: Diagnosis not present

## 2023-03-10 DIAGNOSIS — D561 Beta thalassemia: Secondary | ICD-10-CM | POA: Diagnosis not present

## 2023-03-10 DIAGNOSIS — U071 COVID-19: Secondary | ICD-10-CM | POA: Diagnosis not present

## 2023-03-10 DIAGNOSIS — I700 Atherosclerosis of aorta: Secondary | ICD-10-CM | POA: Diagnosis not present

## 2023-03-13 ENCOUNTER — Ambulatory Visit: Payer: Medicare PPO

## 2023-03-14 ENCOUNTER — Encounter: Payer: Self-pay | Admitting: Family Medicine

## 2023-03-14 ENCOUNTER — Telehealth: Payer: Self-pay

## 2023-03-14 ENCOUNTER — Ambulatory Visit: Payer: Medicare PPO | Admitting: Family Medicine

## 2023-03-14 VITALS — BP 120/74 | HR 68 | Ht 65.0 in | Wt 96.4 lb

## 2023-03-14 DIAGNOSIS — E876 Hypokalemia: Secondary | ICD-10-CM

## 2023-03-14 DIAGNOSIS — E538 Deficiency of other specified B group vitamins: Secondary | ICD-10-CM | POA: Diagnosis not present

## 2023-03-14 NOTE — Progress Notes (Signed)
Date:  03/14/2023   Name:  Phyllis Ross   DOB:  1935/08/26   MRN:  [id number]   Chief Complaint: Follow-up (Patient is here today but not really sure what she is following up on. Since her last visit she is doing much better, she is eating well, energy level is a little low. )  Patient is a 87 year old female who presents for a low B12 evaluation exam. The patient reports the following problems: F/U B12 deficieciency. Health maintenance has been reviewed up to date.      Lab Results  Component Value Date   NA 133 (L) 02/28/2023   K 3.4 (L) 02/28/2023   CO2 30 (H) 02/28/2023   GLUCOSE 110 (H) 02/28/2023   BUN 27 02/28/2023   CREATININE 0.84 02/28/2023   CALCIUM 9.4 02/28/2023   EGFR 68 02/28/2023   GFRNONAA >60 02/25/2023   Lab Results  Component Value Date   CHOL 125 10/10/2022   HDL 70 10/10/2022   LDLCALC 37 10/10/2022   TRIG 94 10/10/2022   CHOLHDL 2.7 10/07/2020   Lab Results  Component Value Date   TSH 2.114 02/23/2023   Lab Results  Component Value Date   HGBA1C 5.5 04/07/2020   Lab Results  Component Value Date   WBC 9.1 02/25/2023   HGB 11.5 (L) 02/25/2023   HCT 35.5 (L) 02/25/2023   MCV 67.4 (L) 02/25/2023   PLT 190 02/25/2023   Lab Results  Component Value Date   ALT 41 (H) 12/26/2022   AST 35 12/26/2022   GGT 12 12/26/2022   ALKPHOS 79 12/26/2022   BILITOT 1.1 12/26/2022   Lab Results  Component Value Date   VD25OH 15.84 (L) 02/24/2023     Review of Systems  Constitutional: Negative.  Negative for chills, fatigue, fever and unexpected weight change.  HENT:  Negative for congestion, sinus pressure, sneezing and sore throat.   Respiratory:  Negative for cough, shortness of breath, wheezing and stridor.   Cardiovascular:  Negative for chest pain and palpitations.  Gastrointestinal:  Negative for abdominal pain, blood in stool, diarrhea and nausea.  Endocrine: Negative for polydipsia and polyuria.  Genitourinary:  Negative for  dysuria, flank pain, frequency, hematuria, urgency and vaginal discharge.  Musculoskeletal:  Negative for arthralgias, back pain and myalgias.  Skin:  Negative for rash.  Neurological:  Negative for dizziness, weakness and headaches.  Hematological:  Negative for adenopathy. Does not bruise/bleed easily.  Psychiatric/Behavioral:  Negative for dysphoric mood. The patient is not nervous/anxious.     Patient Active Problem List   Diagnosis Date Noted   Hypokalemia 02/23/2023   COVID-19 virus infection 02/23/2023   Primary osteoarthritis of right hip 07/24/2022   History of colon cancer    Polyp of transverse colon    B12 deficiency 09/16/2020   Goals of care, counseling/discussion 08/07/2020   Microcytic anemia 07/23/2020   Status post partial colectomy 07/02/2020   Protein-calorie malnutrition, severe 06/24/2020   Cecal cancer (HCC) 06/15/2020   Iron deficiency anemia due to chronic blood loss    Neoplasm of lower gastrointestinal tract    Acute gastritis with hemorrhage    Personal history of other malignant neoplasm of skin 01/21/2019   Atherosclerosis of abdominal aorta (HCC) 12/26/2017   Age-related osteoporosis without current pathological fracture 06/16/2016   Encounter for follow-up surveillance of ovarian cancer 05/20/2015   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   Wedging of vertebra (HCC) 10/13/2014   Polypharmacy 10/13/2014   Encounter  for general adult medical examination without abnormal findings 10/13/2014   Anxiety 10/13/2014   Essential hypertension 08/27/2014   Bradycardia 08/27/2014   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) 08/27/2014   MI (mitral incompetence) 08/27/2014   TI (tricuspid incompetence) 08/27/2014   Combined fat and carbohydrate induced hyperlipemia 08/13/2014   HTN (hypertension), malignant 08/11/2014   Chest pain 08/11/2014   CAD (coronary artery disease) of artery bypass graft 08/11/2014     Allergies  Allergen Reactions   Trazodone And Nefazodone Other (See Comments)    Made her pass out    Past Surgical History:  Procedure Laterality Date   COLONOSCOPY  04/04/2009   normal- Dr Mechele Collin   COLONOSCOPY WITH PROPOFOL N/A 06/04/2020   Procedure: COLONOSCOPY WITH PROPOFOL;  Surgeon: Midge Minium, MD;  Location: [facility];  Service: Endoscopy;  Laterality: N/A;   COLONOSCOPY WITH PROPOFOL N/A 01/31/2022   Procedure: COLONOSCOPY WITH PROPOFOL;  Surgeon: Midge Minium, MD;  Location: [facility];  Service: Endoscopy;  Laterality: N/A;   CORONARY ARTERY BYPASS GRAFT N/A 08/14/2006   Procedure: CORONARY ARTERY BYPASS GRAFT; Location: [facility]; Surgeon: Tilford Pillar, MD   CYSTOSCOPY WITH RETROGRADE PYELOGRAM, URETEROSCOPY AND STENT PLACEMENT Right 11/12/2021   Procedure: CYSTOSCOPY WITH RETROGRADE PYELOGRAM / RIGHT URETEROSCOPY/HOLMIUM LASER/RIGHT STENT EXCHANGE;  Surgeon: Sondra Come, MD;  Location: [facility];  Service: Urology;  Laterality: Right;   CYSTOSCOPY/URETEROSCOPY/HOLMIUM LASER/STENT PLACEMENT Right 10/29/2021   Procedure: CYSTOSCOPY/ RIGHT STENT PLACEMENT;  Surgeon: Sondra Come, MD;  Location: [facility];  Service: Urology;  Laterality: Right;   ESOPHAGOGASTRODUODENOSCOPY (EGD) WITH PROPOFOL N/A 06/04/2020   Procedure: ESOPHAGOGASTRODUODENOSCOPY (EGD) WITH PROPOFOL;  Surgeon: Midge Minium, MD;  Location: [facility];  Service: Endoscopy;  Laterality: N/A;   FRACTURE SURGERY     ankle left   LEFT HEART CATH AND CORONARY ANGIOGRAPHY Left 08/14/2006   Procedure: LEFT HEART CATHETERIZATION AND CORONARY ANGIOGRAPHY; Location: [facility]: Surgeon: Rudean Hitt, MD   VAGINAL HYSTERECTOMY     DUB    Social History   Tobacco Use   Smoking status: Never    Passive exposure: Never   Smokeless tobacco: Never  Vaping Use   Vaping status: Never Used  Substance Use Topics   Alcohol use: No   Drug use: No     Medication list has been reviewed and  updated.  Current Meds  Medication Sig   amLODipine (NORVASC) 5 MG tablet Take 1 tablet (5 mg total) by mouth daily. Skip the dose if systolic BP less than 140 mmHg   atorvastatin (LIPITOR) 80 MG tablet TAKE (1) TABLET BY MOUTH EVERY DAY   ferrous sulfate 325 (65 FE) MG tablet Take 1 tablet (325 mg total) by mouth daily with breakfast.   hydrOXYzine (ATARAX) 10 MG tablet Take 1 tablet (10 mg total) by mouth at bedtime as needed.   meloxicam (MOBIC) 7.5 MG tablet TAKE (1) TABLET BY MOUTH EVERY DAY   montelukast (SINGULAIR) 10 MG tablet TAKE (1) TABLET BY MOUTH DAILY AT BEDTIME   Nutritional Supplements (ENSURE COMPLETE PO) Take 1 Can by mouth daily.   potassium chloride SA (KLOR-CON M) 20 MEQ tablet TAKE (1) TABLET BY MOUTH EVERY DAY   Vitamin D, Ergocalciferol, (DRISDOL) 1.25 MG (50000 UNIT) CAPS capsule Take 1 capsule (50,000 Units total) by mouth every 7 (seven) days.       03/14/2023    3:12 PM 02/28/2023    1:27 PM 10/10/2022    9:21 AM 10/10/2022  9:19 AM  GAD 7 : Generalized Anxiety Score  Nervous, Anxious, on Edge 1 0 0 0  Control/stop worrying 0 0 0 0  Worry too much - different things 1 0 0 0  Trouble relaxing 0 0 0 0  Restless 0 0 0 0  Easily annoyed or irritable 2 0 0 0  Afraid - awful might happen 0 0 0 0  Total GAD 7 Score 4 0 0 0  Anxiety Difficulty Not difficult at all Not difficult at all Not difficult at all Not difficult at all       03/14/2023    3:12 PM 02/28/2023    1:27 PM 02/20/2023    1:44 PM  Depression screen PHQ 2/9  Decreased Interest 0 0 0  Down, Depressed, Hopeless 0 0 0  PHQ - 2 Score 0 0 0  Altered sleeping 3 0   Tired, decreased energy 3 3   Change in appetite 0 2   Feeling bad or failure about yourself  0 0   Trouble concentrating 0 0   Moving slowly or fidgety/restless 0 0   Suicidal thoughts 0 0   PHQ-9 Score 6 5   Difficult doing work/chores Not difficult at all Not difficult at all     BP Readings from Last 3 Encounters:   03/14/23 120/74  02/28/23 (!) 112/58  02/26/23 (!) 155/101    Physical Exam Vitals and nursing note reviewed. Exam conducted with a chaperone present.  Constitutional:      General: She is not in acute distress.    Appearance: She is not diaphoretic.  HENT:     Head: Normocephalic and atraumatic.     Right Ear: Tympanic membrane, ear canal and external ear normal.     Left Ear: Tympanic membrane, ear canal and external ear normal.     Nose: Nose normal. No congestion or rhinorrhea.     Mouth/Throat:     Mouth: Mucous membranes are moist.  Eyes:     General:        Right eye: No discharge.        Left eye: No discharge.     Conjunctiva/sclera: Conjunctivae normal.     Pupils: Pupils are equal, round, and reactive to light.  Neck:     Thyroid: No thyromegaly.     Vascular: No JVD.  Cardiovascular:     Rate and Rhythm: Normal rate and regular rhythm.     Heart sounds: Normal heart sounds. No murmur heard.    No friction rub. No gallop.  Pulmonary:     Effort: Pulmonary effort is normal.     Breath sounds: Normal breath sounds. No wheezing, rhonchi or rales.  Abdominal:     General: Bowel sounds are normal.     Palpations: Abdomen is soft. There is no mass.     Tenderness: There is no abdominal tenderness. There is no guarding.  Musculoskeletal:        General: Normal range of motion.     Cervical back: Normal range of motion and neck supple.  Lymphadenopathy:     Cervical: No cervical adenopathy.  Skin:    General: Skin is warm and dry.  Neurological:     Mental Status: She is alert.     Wt Readings from Last 3 Encounters:  03/14/23 96 lb 6.4 oz (43.7 kg)  02/28/23 97 lb (44 kg)  02/23/23 101 lb 13.6 oz (46.2 kg)    BP 120/74   Pulse 68  Ht 5\' 5"  (1.651 m)   Wt 96 lb 6.4 oz (43.7 kg)   SpO2 96%   BMI 16.04 kg/m   Assessment and Plan: 1. Hypokalemia Chronic.  Correcting.  Stable.  Patient currently receiving potassium KCl at 20 mill equivalents per day.   Supplementation.  We will continue at current dosing of 20 mEq.  We will check renal function panel for current stabilization. - Renal Function Panel  2. B12 deficiency Patient is followed by Dr. Smith Robert for B12 deficiency.  We are administering the B12 and we will check her B12 level and will forward this to Dr. Smith Robert. - CBC with Differential/Platelet - Vitamin B12     Elizabeth Sauer, MD

## 2023-03-15 ENCOUNTER — Encounter: Payer: Self-pay | Admitting: Oncology

## 2023-03-15 ENCOUNTER — Encounter: Payer: Self-pay | Admitting: Family Medicine

## 2023-03-15 DIAGNOSIS — F419 Anxiety disorder, unspecified: Secondary | ICD-10-CM | POA: Diagnosis not present

## 2023-03-15 DIAGNOSIS — E440 Moderate protein-calorie malnutrition: Secondary | ICD-10-CM | POA: Diagnosis not present

## 2023-03-15 DIAGNOSIS — N189 Chronic kidney disease, unspecified: Secondary | ICD-10-CM | POA: Diagnosis not present

## 2023-03-15 DIAGNOSIS — D561 Beta thalassemia: Secondary | ICD-10-CM | POA: Diagnosis not present

## 2023-03-15 DIAGNOSIS — U071 COVID-19: Secondary | ICD-10-CM | POA: Diagnosis not present

## 2023-03-15 DIAGNOSIS — I700 Atherosclerosis of aorta: Secondary | ICD-10-CM | POA: Diagnosis not present

## 2023-03-15 DIAGNOSIS — I129 Hypertensive chronic kidney disease with stage 1 through stage 4 chronic kidney disease, or unspecified chronic kidney disease: Secondary | ICD-10-CM | POA: Diagnosis not present

## 2023-03-15 DIAGNOSIS — I2510 Atherosclerotic heart disease of native coronary artery without angina pectoris: Secondary | ICD-10-CM | POA: Diagnosis not present

## 2023-03-15 DIAGNOSIS — D631 Anemia in chronic kidney disease: Secondary | ICD-10-CM | POA: Diagnosis not present

## 2023-03-15 LAB — CBC WITH DIFFERENTIAL/PLATELET
Basophils Absolute: 0 10*3/uL (ref 0.0–0.2)
Basos: 1 %
EOS (ABSOLUTE): 0.1 10*3/uL (ref 0.0–0.4)
Eos: 1 %
Hematocrit: 34.6 % (ref 34.0–46.6)
Hemoglobin: 10.6 g/dL — ABNORMAL LOW (ref 11.1–15.9)
Immature Grans (Abs): 0 10*3/uL (ref 0.0–0.1)
Immature Granulocytes: 0 %
Lymphocytes Absolute: 2 10*3/uL (ref 0.7–3.1)
Lymphs: 26 %
MCH: 21.9 pg — ABNORMAL LOW (ref 26.6–33.0)
MCHC: 30.6 g/dL — ABNORMAL LOW (ref 31.5–35.7)
MCV: 72 fL — ABNORMAL LOW (ref 79–97)
Monocytes Absolute: 0.7 10*3/uL (ref 0.1–0.9)
Monocytes: 9 %
Neutrophils Absolute: 4.8 10*3/uL (ref 1.4–7.0)
Neutrophils: 63 %
Platelets: 251 10*3/uL (ref 150–450)
RBC: 4.83 x10E6/uL (ref 3.77–5.28)
RDW: 15.3 % (ref 11.7–15.4)
WBC: 7.6 10*3/uL (ref 3.4–10.8)

## 2023-03-15 LAB — RENAL FUNCTION PANEL
Albumin: 4 g/dL (ref 3.7–4.7)
BUN/Creatinine Ratio: 28 (ref 12–28)
BUN: 24 mg/dL (ref 8–27)
CO2: 26 mmol/L (ref 20–29)
Calcium: 9.4 mg/dL (ref 8.7–10.3)
Chloride: 99 mmol/L (ref 96–106)
Creatinine, Ser: 0.86 mg/dL (ref 0.57–1.00)
Glucose: 81 mg/dL (ref 70–99)
Phosphorus: 4.2 mg/dL (ref 3.0–4.3)
Potassium: 4.9 mmol/L (ref 3.5–5.2)
Sodium: 140 mmol/L (ref 134–144)
eGFR: 66 mL/min/{1.73_m2} (ref >59)

## 2023-03-15 LAB — VITAMIN B12: Vitamin B-12: >2000 pg/mL — ABNORMAL HIGH (ref 232–1245)

## 2023-03-16 LAB — COMPREHENSIVE METABOLIC PANEL
ALT: 41 [IU]/L — ABNORMAL HIGH (ref 0–32)
AST: 35 [IU]/L (ref 0–40)
Albumin: 4.1 g/dL (ref 3.7–4.7)
Alkaline Phosphatase: 79 [IU]/L (ref 44–121)
BUN/Creatinine Ratio: 19 (ref 12–28)
BUN: 18 mg/dL (ref 8–27)
Bilirubin Total: 1.1 mg/dL (ref 0.0–1.2)
CO2: 24 mmol/L (ref 20–29)
Calcium: 9.4 mg/dL (ref 8.7–10.3)
Chloride: 99 mmol/L (ref 96–106)
Creatinine, Ser: 0.95 mg/dL (ref 0.57–1.00)
Globulin, Total: 2.7 g/dL (ref 1.5–4.5)
Glucose: 83 mg/dL (ref 70–99)
Potassium: 4.6 mmol/L (ref 3.5–5.2)
Sodium: 140 mmol/L (ref 134–144)
Total Protein: 6.8 g/dL (ref 6.0–8.5)
eGFR: 59 mL/min/{1.73_m2} — ABNORMAL LOW (ref >59)

## 2023-03-16 LAB — SPECIMEN STATUS REPORT

## 2023-03-21 ENCOUNTER — Encounter: Payer: Medicare PPO | Admitting: *Deleted

## 2023-03-30 ENCOUNTER — Encounter: Payer: Self-pay | Admitting: Oncology

## 2023-03-30 DIAGNOSIS — U071 COVID-19: Secondary | ICD-10-CM | POA: Diagnosis not present

## 2023-03-30 DIAGNOSIS — E440 Moderate protein-calorie malnutrition: Secondary | ICD-10-CM | POA: Diagnosis not present

## 2023-03-30 DIAGNOSIS — I700 Atherosclerosis of aorta: Secondary | ICD-10-CM | POA: Diagnosis not present

## 2023-03-30 DIAGNOSIS — F419 Anxiety disorder, unspecified: Secondary | ICD-10-CM | POA: Diagnosis not present

## 2023-03-30 DIAGNOSIS — D631 Anemia in chronic kidney disease: Secondary | ICD-10-CM | POA: Diagnosis not present

## 2023-03-30 DIAGNOSIS — I129 Hypertensive chronic kidney disease with stage 1 through stage 4 chronic kidney disease, or unspecified chronic kidney disease: Secondary | ICD-10-CM | POA: Diagnosis not present

## 2023-03-30 DIAGNOSIS — N189 Chronic kidney disease, unspecified: Secondary | ICD-10-CM | POA: Diagnosis not present

## 2023-03-30 DIAGNOSIS — D561 Beta thalassemia: Secondary | ICD-10-CM | POA: Diagnosis not present

## 2023-03-30 DIAGNOSIS — I2510 Atherosclerotic heart disease of native coronary artery without angina pectoris: Secondary | ICD-10-CM | POA: Diagnosis not present

## 2023-03-31 ENCOUNTER — Ambulatory Visit: Payer: Self-pay | Admitting: *Deleted

## 2023-03-31 NOTE — Patient Outreach (Signed)
  Care Coordination   Follow Up Visit Note   03/31/2023 Name: Phyllis Ross MRN: [id number] DOB: 21-Apr-1935  Phyllis Ross is a 87 y.o. year old female who sees Duanne Limerick, MD for primary care. I spoke with Judeth Cornfield, daughter of [name] by phone today.  What matters to the patients health and wellness today?  Daughter report patient's energy level has increased, continues to recover well from hospital admission for Covid     Goals Addressed             This Visit's Progress    Effective management of health conditions   On track    Interventions Today    Flowsheet Row Most Recent Value  Chronic Disease   Chronic disease during today's visit Other  [HTN, CAD, covid recovery]  General Interventions   General Interventions Discussed/Reviewed General Interventions Reviewed, Doctor Visits, Level of Care, Durable Medical Equipment (DME), Communication with  Doctor Visits Discussed/Reviewed Doctor Visits Reviewed, PCP  [PCP on 12/10]  Durable Medical Equipment (DME) Wheelchair  Wheelchair Standard  [Daughter feels patient would benefit from having a rollator to use when she is out]  PCP/Specialist Visits Compliance with follow-up visit  Communication with PCP/Specialists  [facility] sent to PCP office regarding request for rollator]  Level of Care Personal Care Services  [Patient will now have 24/7 aide coverage in the home for the next several weeks]  Education Interventions   Education Provided Provided Education  Provided Verbal Education On Nutrition, Medication, When to see the doctor  [Daughter report increased appetite, now active with [facility] for PT]              SDOH assessments and interventions completed:  No     Care Coordination Interventions:  Yes, provided   Follow up plan: Follow up call scheduled for 1/27    Encounter Outcome:  Patient Visit Completed   Rodney Langton, RN, MSN, CCM [place]  [facility],  [facility] RN Care Coordinator Direct Dial: [phone] / Main [phone] Fax [phone] Email: [email or web address]: [email or web address]

## 2023-04-04 ENCOUNTER — Ambulatory Visit: Payer: Medicare PPO | Admitting: Family Medicine

## 2023-04-06 DIAGNOSIS — E782 Mixed hyperlipidemia: Secondary | ICD-10-CM | POA: Diagnosis not present

## 2023-04-06 DIAGNOSIS — R630 Anorexia: Secondary | ICD-10-CM | POA: Diagnosis not present

## 2023-04-06 DIAGNOSIS — I25810 Atherosclerosis of coronary artery bypass graft(s) without angina pectoris: Secondary | ICD-10-CM | POA: Diagnosis not present

## 2023-04-06 DIAGNOSIS — Z0000 Encounter for general adult medical examination without abnormal findings: Secondary | ICD-10-CM | POA: Diagnosis not present

## 2023-04-12 DIAGNOSIS — C44329 Squamous cell carcinoma of skin of other parts of face: Secondary | ICD-10-CM | POA: Diagnosis not present

## 2023-04-19 ENCOUNTER — Other Ambulatory Visit: Payer: Self-pay | Admitting: Family Medicine

## 2023-04-19 DIAGNOSIS — J45901 Unspecified asthma with (acute) exacerbation: Secondary | ICD-10-CM

## 2023-04-20 NOTE — Telephone Encounter (Signed)
Requested Prescriptions  Pending Prescriptions Disp Refills   montelukast (SINGULAIR) 10 MG tablet [Pharmacy Med Name: MONTELUKAST SODIUM 10MG  TABLET] 90 tablet 0    Sig: TAKE (1) TABLET BY MOUTH DAILY AT BEDTIME     Pulmonology:  Leukotriene Inhibitors Passed - 04/20/2023  9:11 AM      Passed - Valid encounter within last 12 months    Recent Outpatient Visits           1 month ago Hypokalemia   [facility], [facility]   1 month ago Post-COVID chronic cough   [place] [facility], [facility]   1 month ago Acute cough   [place] [facility], MD   6 months ago Familial multiple lipoprotein-type hyperlipidemia   [place] [facility], MD   8 months ago Cellulitis of right lower extremity   [place] [facility], MD       Future Appointments             In 1 month Richardo Hanks, Laurette Schimke, MD [facility] [place]

## 2023-04-24 DIAGNOSIS — H353132 Nonexudative age-related macular degeneration, bilateral, intermediate dry stage: Secondary | ICD-10-CM | POA: Diagnosis not present

## 2023-04-24 DIAGNOSIS — D485 Neoplasm of uncertain behavior of skin: Secondary | ICD-10-CM | POA: Diagnosis not present

## 2023-04-26 ENCOUNTER — Encounter: Payer: Self-pay | Admitting: Emergency Medicine

## 2023-04-26 ENCOUNTER — Ambulatory Visit
Admission: EM | Admit: 2023-04-26 | Discharge: 2023-04-26 | Disposition: A | Discharge status: Home / Self Care | Payer: Medicare PPO

## 2023-04-26 ENCOUNTER — Ambulatory Visit (INDEPENDENT_AMBULATORY_CARE_PROVIDER_SITE_OTHER): Payer: Medicare PPO

## 2023-04-26 ENCOUNTER — Other Ambulatory Visit: Payer: Self-pay

## 2023-04-26 DIAGNOSIS — M79604 Pain in right leg: Secondary | ICD-10-CM

## 2023-04-26 DIAGNOSIS — Z85038 Personal history of other malignant neoplasm of large intestine: Secondary | ICD-10-CM

## 2023-04-26 DIAGNOSIS — S99911A Unspecified injury of right ankle, initial encounter: Secondary | ICD-10-CM | POA: Diagnosis not present

## 2023-04-26 DIAGNOSIS — S93601A Unspecified sprain of right foot, initial encounter: Secondary | ICD-10-CM

## 2023-04-26 DIAGNOSIS — S99921A Unspecified injury of right foot, initial encounter: Secondary | ICD-10-CM | POA: Diagnosis not present

## 2023-04-26 NOTE — ED Provider Notes (Signed)
[facility]    CSN: [id number] Arrival date & time: 04/26/23  1020      History   Chief Complaint Chief Complaint  Patient presents with   Foot Pain    HPI Phyllis Ross is a 88 y.o. female presenting for pain of right foot and ankle since last night.  She reports twisting her foot and ankle after she was standing up last night and has had pain ever since.  States she did not fall onto the ground but did fall into the couch.  Reports having to walk on her heel due to the pain in her foot.  Has applied ice and taken Tylenol.  Denies numbness, weakness, falls.  No previous fracture of the affected foot/ankle.  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   a.) stage IIIc (G2, (+) LVI, (-) PNI, T3N3BMX) --> s/p RIGHT hemicolectomy + oral capecitabine   Anemia    Anxiety    a.) on BZO (alprazolam) PRN   Aortic atherosclerosis (HCC)    Arthritis    Beta thalassemia (HCC)    Bradycardia    CAD (coronary artery disease) 08/14/2006   a.) STEMI 08/14/2006 --> LHC at [facility] --> EF 45%, 95% mLAD, 95% OM1, and 75% LV groove artery --> transfer to [facility] for CVTS consult; b.) 3v CABG 08/14/2006   Chronic kidney disease    History of kidney stones    Hyperlipidemia    Hypertension    Osteoporosis    Ovarian abscess    Ovarian cancer (HCC) 2005   a.) stage IIIb adenocarcinoma --> s/p CRS 2005 + 6 cycles of carboplatin/paclitaxel   Pneumonia    as a baby   Rheumatic fever    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   a.) LIMA-LAD, SVG-RI, SVG-OM1   Skin cancer    STEMI (ST elevation myocardial infarction) (HCC) 08/14/2006   a.) LHC at [facility] --> transferred to [facility] for emergent CABG    Patient Active Problem List   Diagnosis Date Noted   Hypokalemia 02/23/2023   COVID-19 virus infection 02/23/2023   Primary osteoarthritis of right hip 07/24/2022   History of colon cancer    Polyp of transverse colon    B12 deficiency 09/16/2020   Goals of  care, counseling/discussion 08/07/2020   Microcytic anemia 07/23/2020   Status post partial colectomy 07/02/2020   Protein-calorie malnutrition, severe 06/24/2020   Cecal cancer (HCC) 06/15/2020   Iron deficiency anemia due to chronic blood loss    Neoplasm of lower gastrointestinal tract    Acute gastritis with hemorrhage    Personal history of other malignant neoplasm of skin 01/21/2019   Atherosclerosis of abdominal aorta (HCC) 12/26/2017   Age-related osteoporosis without current pathological fracture 06/16/2016   Encounter for follow-up surveillance of ovarian cancer 05/20/2015   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   Wedging of vertebra (HCC) 10/13/2014   Polypharmacy 10/13/2014   Encounter for general adult medical examination without abnormal findings 10/13/2014   Anxiety 10/13/2014   Essential hypertension 08/27/2014   Bradycardia 08/27/2014   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) 08/27/2014   MI (mitral incompetence) 08/27/2014   TI (tricuspid incompetence) 08/27/2014   Combined fat and carbohydrate induced hyperlipemia 08/13/2014   HTN (hypertension), malignant 08/11/2014   Chest pain 08/11/2014   CAD (coronary artery disease) of artery bypass graft 08/11/2014    Past Surgical History:  Procedure Laterality Date   COLONOSCOPY  04/04/2009   normal- Dr Mechele Collin   COLONOSCOPY WITH PROPOFOL N/A 06/04/2020   Procedure: COLONOSCOPY WITH PROPOFOL;  Surgeon: Midge Minium, MD;  Location: [facility];  Service: Endoscopy;  Laterality: N/A;   COLONOSCOPY WITH PROPOFOL N/A 01/31/2022   Procedure: COLONOSCOPY WITH PROPOFOL;  Surgeon: Midge Minium, MD;  Location: [facility];  Service: Endoscopy;  Laterality: N/A;   CORONARY ARTERY BYPASS GRAFT N/A 08/14/2006   Procedure: CORONARY ARTERY BYPASS GRAFT; Location: [facility]; Surgeon: Tilford Pillar, MD   CYSTOSCOPY WITH RETROGRADE PYELOGRAM, URETEROSCOPY AND STENT  PLACEMENT Right 11/12/2021   Procedure: CYSTOSCOPY WITH RETROGRADE PYELOGRAM / RIGHT URETEROSCOPY/HOLMIUM LASER/RIGHT STENT EXCHANGE;  Surgeon: Sondra Come, MD;  Location: [facility];  Service: Urology;  Laterality: Right;   CYSTOSCOPY/URETEROSCOPY/HOLMIUM LASER/STENT PLACEMENT Right 10/29/2021   Procedure: CYSTOSCOPY/ RIGHT STENT PLACEMENT;  Surgeon: Sondra Come, MD;  Location: [facility];  Service: Urology;  Laterality: Right;   ESOPHAGOGASTRODUODENOSCOPY (EGD) WITH PROPOFOL N/A 06/04/2020   Procedure: ESOPHAGOGASTRODUODENOSCOPY (EGD) WITH PROPOFOL;  Surgeon: Midge Minium, MD;  Location: [facility];  Service: Endoscopy;  Laterality: N/A;   FRACTURE SURGERY     ankle left   LEFT HEART CATH AND CORONARY ANGIOGRAPHY Left 08/14/2006   Procedure: LEFT HEART CATHETERIZATION AND CORONARY ANGIOGRAPHY; Location: [facility]: Surgeon: Rudean Hitt, MD   VAGINAL HYSTERECTOMY     DUB    OB History   No obstetric history on file.      Home Medications    Prior to Admission medications   Medication Sig Start Date End Date Taking? Authorizing Provider  ALPRAZolam Prudy Feeler) 0.25 MG tablet Take by mouth. 04/06/23  Yes [provider]  mirtazapine (REMERON) 15 MG tablet Take 1 tablet by mouth at bedtime. 04/06/23 04/05/24 Yes [provider]  amLODipine (NORVASC) 5 MG tablet Take 1 tablet (5 mg total) by mouth daily. Skip the dose if systolic BP less than 140 mmHg 02/27/23 05/28/23  Gillis Santa, MD  atorvastatin (LIPITOR) 80 MG tablet TAKE (1) TABLET BY MOUTH EVERY DAY 10/10/22   Duanne Limerick, MD  ferrous sulfate 325 (65 FE) MG tablet Take 1 tablet (325 mg total) by mouth daily with breakfast. 10/10/22   Duanne Limerick, MD  hydrOXYzine (ATARAX) 10 MG tablet Take 1 tablet (10 mg total) by mouth at bedtime as needed. 02/20/23   Duanne Limerick, MD  meloxicam (MOBIC) 7.5 MG tablet TAKE (1) TABLET BY MOUTH EVERY DAY 01/12/23   Duanne Limerick, MD  montelukast (SINGULAIR) 10 MG tablet  TAKE (1) TABLET BY MOUTH DAILY AT BEDTIME 04/20/23   Duanne Limerick, MD  Nutritional Supplements (ENSURE COMPLETE PO) Take 1 Can by mouth daily.    [provider]  potassium chloride SA (KLOR-CON M) 20 MEQ tablet TAKE (1) TABLET BY MOUTH EVERY DAY 10/10/22   Duanne Limerick, MD  Vitamin D, Ergocalciferol, (DRISDOL) 1.25 MG (50000 UNIT) CAPS capsule Take 1 capsule (50,000 Units total) by mouth every 7 (seven) days. 03/04/23 06/02/23  Gillis Santa, MD    Family History Family History  Problem Relation Age of Onset   Heart attack Mother    Heart attack Maternal Uncle    Heart attack Maternal Uncle     Social History Social History   Tobacco Use   Smoking status: Never    Passive exposure: Never   Smokeless tobacco: Never  Vaping Use   Vaping status: Never Used  Substance Use Topics   Alcohol use: No   Drug use: No  Allergies   Trazodone and nefazodone   Review of Systems Review of Systems  Musculoskeletal:  Positive for arthralgias and gait problem. Negative for joint swelling.  Skin:  Negative for color change and wound.  Neurological:  Negative for weakness and numbness.     Physical Exam Triage Vital Signs ED Triage Vitals  Encounter Vitals Group     BP 04/26/23 1040 100/64     Systolic BP Percentile --      Diastolic BP Percentile --      Pulse Rate 04/26/23 1040 72     Resp 04/26/23 1040 16     Temp 04/26/23 1040 98 F (36.7 C)     Temp Source 04/26/23 1040 Oral     SpO2 04/26/23 1040 100 %     Weight --      Height --      Head Circumference --      Peak Flow --      Pain Score 04/26/23 1038 7     Pain Loc --      Pain Education --      Exclude from Growth Chart --    No data found.  Updated Vital Signs BP 100/64 (BP Location: Right Arm)   Pulse 72   Temp 98 F (36.7 C) (Oral)   Resp 16   SpO2 100%    Physical Exam Vitals and nursing note reviewed.  Constitutional:      General: She is not in acute distress.    Appearance:  Normal appearance. She is not ill-appearing or toxic-appearing.  HENT:     Head: Normocephalic and atraumatic.     Nose: Nose normal.     Mouth/Throat:     Mouth: Mucous membranes are moist.     Pharynx: Oropharynx is clear.  Eyes:     General: No scleral icterus.       Right eye: No discharge.        Left eye: No discharge.     Conjunctiva/sclera: Conjunctivae normal.  Cardiovascular:     Rate and Rhythm: Normal rate and regular rhythm.     Pulses: Normal pulses.  Pulmonary:     Effort: Pulmonary effort is normal. No respiratory distress.  Musculoskeletal:     Cervical back: Neck supple.     Comments: Right foot/ankle: No swelling, contusions, abrasions or wounds.  Mild tenderness to the lateral malleolus and throughout the lateral dorsal foot.  Good pulses and strength.  Full range of motion.  Skin:    General: Skin is dry.  Neurological:     General: No focal deficit present.     Mental Status: She is alert. Mental status is at baseline.     Motor: No weakness.     Gait: Gait normal.  Psychiatric:        Mood and Affect: Mood normal.        Behavior: Behavior normal.      UC Treatments / Results  Labs (all labs ordered are listed, but only abnormal results are displayed) Labs Reviewed - No data to display  EKG   Radiology DG Ankle Complete Right Result Date: 04/26/2023 CLINICAL DATA:  Foot and ankle injury. EXAM: RIGHT ANKLE - COMPLETE 3+ VIEW COMPARISON:  None Available. FINDINGS: There is no evidence of fracture, dislocation, or joint effusion. There is no evidence of arthropathy or other focal bone abnormality. Soft tissues are unremarkable. IMPRESSION: Negative. Electronically Signed   By: Kennith Center M.D.   On: 04/26/2023 11:17  DG Foot Complete Right Result Date: 04/26/2023 CLINICAL DATA:  Foot and ankle injury. EXAM: RIGHT FOOT COMPLETE - 3+ VIEW COMPARISON:  None Available. FINDINGS: There is no evidence of fracture or dislocation. There is no evidence of  arthropathy or other focal bone abnormality. Soft tissues are unremarkable. IMPRESSION: Negative. Electronically Signed   By: Kennith Center M.D.   On: 04/26/2023 11:16    Procedures Procedures (including critical care time)  Medications Ordered in UC Medications - No data to display  Initial Impression / Assessment and Plan / UC Course  I have reviewed the triage vital signs and the nursing notes.  Pertinent labs & imaging results that were available during my care of the patient were reviewed by me and considered in my medical decision making (see chart for details).   88 year old female presents for right foot and ankle pain after twisting her foot and ankle last night when she stood up.  X-ray of right foot ankle obtained today.  Imaging all negative.  Reviewed results patient.  Likely foot/ankle sprain.  Advised Tylenol, continuing RICE guidelines.  Patient wanted to wear a boot due to pain whenever she is weightbearing.  Patient placed in cam boot.  Reviewed return precautions.   Final Clinical Impressions(s) / UC Diagnoses   Final diagnoses:  Acute pain of right lower extremity  Sprain of right foot, initial encounter     Discharge Instructions      X-ray were normal.  SPRAIN: Stressed avoiding painful activities . Reviewed RICE guidelines. Use medications as directed, including NSAIDs. If no NSAIDs have been prescribed for you today, you may take Aleve or Motrin over the counter unless your doctor has advised you not to. May use Tylenol in between doses of NSAIDs.  If no improvement in the next 1-2 weeks, f/u with PCP or return to our office for reexamination, and please feel free to call or return at any time for any questions or concerns you may have and we will be happy to help you!         ED Prescriptions   None    PDMP not reviewed this encounter.   Shirlee Latch, PA-C 04/26/23 1139

## 2023-04-26 NOTE — Discharge Instructions (Signed)
X-ray were normal.  SPRAIN: Stressed avoiding painful activities . Reviewed RICE guidelines. Use medications as directed, including NSAIDs. If no NSAIDs have been prescribed for you today, you may take Aleve or Motrin over the counter unless your doctor has advised you not to. May use Tylenol in between doses of NSAIDs.  If no improvement in the next 1-2 weeks, f/u with PCP or return to our office for reexamination, and please feel free to call or return at any time for any questions or concerns you may have and we will be happy to help you!

## 2023-04-26 NOTE — ED Triage Notes (Signed)
Pt went to stand up last night and twist her right ankle.

## 2023-04-28 ENCOUNTER — Inpatient Hospital Stay: Payer: Medicare PPO | Attending: Oncology

## 2023-04-28 ENCOUNTER — Other Ambulatory Visit: Payer: Self-pay | Admitting: Oncology

## 2023-04-28 ENCOUNTER — Inpatient Hospital Stay (HOSPITAL_BASED_OUTPATIENT_CLINIC_OR_DEPARTMENT_OTHER): Payer: Medicare PPO | Admitting: Oncology

## 2023-04-28 ENCOUNTER — Inpatient Hospital Stay: Payer: Medicare PPO

## 2023-04-28 ENCOUNTER — Encounter: Payer: Self-pay | Admitting: Oncology

## 2023-04-28 VITALS — BP 122/64 | HR 70 | Temp 97.5°F | Resp 18 | Wt 95.0 lb

## 2023-04-28 DIAGNOSIS — E538 Deficiency of other specified B group vitamins: Secondary | ICD-10-CM | POA: Insufficient documentation

## 2023-04-28 DIAGNOSIS — Z08 Encounter for follow-up examination after completed treatment for malignant neoplasm: Secondary | ICD-10-CM | POA: Insufficient documentation

## 2023-04-28 DIAGNOSIS — Z8543 Personal history of malignant neoplasm of ovary: Secondary | ICD-10-CM | POA: Diagnosis not present

## 2023-04-28 DIAGNOSIS — Z85038 Personal history of other malignant neoplasm of large intestine: Secondary | ICD-10-CM | POA: Insufficient documentation

## 2023-04-28 LAB — CBC WITH DIFFERENTIAL (CANCER CENTER ONLY)
Abs Immature Granulocytes: 0.03 10*3/uL (ref 0.00–0.07)
Basophils Absolute: 0.1 10*3/uL (ref 0.0–0.1)
Basophils Relative: 1 %
Eosinophils Absolute: 0.1 10*3/uL (ref 0.0–0.5)
Eosinophils Relative: 2 %
HCT: 35.1 % — ABNORMAL LOW (ref 36.0–46.0)
Hemoglobin: 10.9 g/dL — ABNORMAL LOW (ref 12.0–15.0)
Immature Granulocytes: 0 %
Lymphocytes Relative: 17 %
Lymphs Abs: 1.3 10*3/uL (ref 0.7–4.0)
MCH: 21.7 pg — ABNORMAL LOW (ref 26.0–34.0)
MCHC: 31.1 g/dL (ref 30.0–36.0)
MCV: 69.8 fL — ABNORMAL LOW (ref 80.0–100.0)
Monocytes Absolute: 0.5 10*3/uL (ref 0.1–1.0)
Monocytes Relative: 7 %
Neutro Abs: 5.3 10*3/uL (ref 1.7–7.7)
Neutrophils Relative %: 73 %
Platelet Count: 200 10*3/uL (ref 150–400)
RBC: 5.03 MIL/uL (ref 3.87–5.11)
RDW: 16.5 % — ABNORMAL HIGH (ref 11.5–15.5)
WBC Count: 7.3 10*3/uL (ref 4.0–10.5)
nRBC: 0 % (ref 0.0–0.2)

## 2023-04-28 LAB — CMP (CANCER CENTER ONLY)
ALT: 67 U/L — ABNORMAL HIGH (ref 0–44)
AST: 42 U/L — ABNORMAL HIGH (ref 15–41)
Albumin: 4.1 g/dL (ref 3.5–5.0)
Alkaline Phosphatase: 55 U/L (ref 38–126)
Anion gap: 10 (ref 5–15)
BUN: 27 mg/dL — ABNORMAL HIGH (ref 8–23)
CO2: 28 mmol/L (ref 22–32)
Calcium: 9.2 mg/dL (ref 8.9–10.3)
Chloride: 102 mmol/L (ref 98–111)
Creatinine: 0.79 mg/dL (ref 0.44–1.00)
GFR, Estimated: >60 mL/min (ref >60)
Glucose, Bld: 96 mg/dL (ref 70–99)
Potassium: 4.5 mmol/L (ref 3.5–5.1)
Sodium: 140 mmol/L (ref 135–145)
Total Bilirubin: 2.1 mg/dL — ABNORMAL HIGH (ref 0.0–1.2)
Total Protein: 7 g/dL (ref 6.5–8.1)

## 2023-04-28 MED ORDER — CYANOCOBALAMIN 1000 MCG/ML IJ SOLN
1000.0000 ug | Freq: Once | INTRAMUSCULAR | Status: AC
  Administered 2023-04-28: 1000 ug via INTRAMUSCULAR
  Filled 2023-04-28: qty 1

## 2023-04-28 NOTE — Progress Notes (Signed)
Hematology/Oncology Consult note [facility]  [phone] [phone]  Patient Care Team: Duanne Limerick, MD as PCP - General (Family Medicine) Lamar Blinks, MD as Consulting Physician (Cardiology) Benita Gutter, RN as Oncology Nurse Navigator Campbell Lerner, MD as Consulting Physician (General Surgery) Creig Hines, MD as Consulting Physician (Oncology) Rodney Langton, RN as [facility] Care Management   Name of the patient: Phyllis Ross  [id number]  1935/09/19   Date of visit: 04/28/23  Diagnosis- stage III cecal adenocarcinoma currently in remission   Chief complaint/ Reason for visit-routine follow-up of colon cancer  Heme/Onc history: Patient is a 88 year old female who was 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  She is s/p laparoscopic right hemicolectomy.  Pathology showed 5.2 x 4.3 x 2.7 cm grade 2 moderately differentiated adenocarcinoma with invasion through muscularis propria into pericolorectal tissue.  7 of 27 lymph nodes positive.  Lymphovascular invasion present but no perineural invasion.  MMR negative.  T3N2BMX.  Right hemicolectomy complicated by anastomotic breakdown/leak which was treated with antibiotics.  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  Also has a history of baseline microcytic anemia secondary to beta thalassemia and baseline hemoglobin is between 10-11.   Patient has met with Dr. Merlene Pulling in the past management chemotherapy options including oral Xeloda has been offered to the patient   Patient completed 8 cycles of adjuvant Xeloda in November 2022.  Patient also has a history of B12 deficiency for which she is on monthly B12 injections  Interval history-patient was admitted to the hospital in November 2024 for diarrhea and  generalized weakness and also was diagnosed with COVID at the same time.  Since then her appetite has gone down and she last 5 to 6 pounds as compared to her baseline.  She still reports ongoing fatigue  ECOG PS- 2 Pain scale- 0  Review of systems- Review of Systems  Constitutional:  Positive for malaise/fatigue. Negative for chills, fever and weight loss.  HENT:  Negative for congestion, ear discharge and nosebleeds.   Eyes:  Negative for blurred vision.  Respiratory:  Negative for cough, hemoptysis, sputum production, shortness of breath and wheezing.   Cardiovascular:  Negative for chest pain, palpitations, orthopnea and claudication.  Gastrointestinal:  Negative for abdominal pain, blood in stool, constipation, diarrhea, heartburn, melena, nausea and vomiting.  Genitourinary:  Negative for dysuria, flank pain, frequency, hematuria and urgency.  Musculoskeletal:  Negative for back pain, joint pain and myalgias.  Skin:  Negative for rash.  Neurological:  Negative for dizziness, tingling, focal weakness, seizures, weakness and headaches.  Endo/Heme/Allergies:  Does not bruise/bleed easily.  Psychiatric/Behavioral:  Negative for depression and suicidal ideas. The patient does not have insomnia.       Allergies  Allergen Reactions   Trazodone And Nefazodone Other (See Comments)    Made her pass out     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   a.) stage IIIc (G2, (+) LVI, (-) PNI, T3N3BMX) --> s/p RIGHT hemicolectomy + oral capecitabine   Anemia    Anxiety    a.) on BZO (alprazolam) PRN   Aortic atherosclerosis (HCC)    Arthritis    Beta thalassemia (HCC)    Bradycardia    CAD (coronary artery disease) 08/14/2006   a.) STEMI 08/14/2006 -->  LHC at [facility] --> EF 45%, 95% mLAD, 95% OM1, and 75% LV groove artery --> transfer to [facility] for CVTS consult; b.) 3v CABG 08/14/2006   Chronic kidney disease    History of kidney stones    Hyperlipidemia     Hypertension    Osteoporosis    Ovarian abscess    Ovarian cancer (HCC) 2005   a.) stage IIIb adenocarcinoma --> s/p CRS 2005 + 6 cycles of carboplatin/paclitaxel   Pneumonia    as a baby   Rheumatic fever    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   a.) LIMA-LAD, SVG-RI, SVG-OM1   Skin cancer    STEMI (ST elevation myocardial infarction) (HCC) 08/14/2006   a.) LHC at [facility] --> transferred to [facility] for emergent CABG     Past Surgical History:  Procedure Laterality Date   COLONOSCOPY  04/04/2009   normal- Dr Mechele Collin   COLONOSCOPY WITH PROPOFOL N/A 06/04/2020   Procedure: COLONOSCOPY WITH PROPOFOL;  Surgeon: Midge Minium, MD;  Location: [facility];  Service: Endoscopy;  Laterality: N/A;   COLONOSCOPY WITH PROPOFOL N/A 01/31/2022   Procedure: COLONOSCOPY WITH PROPOFOL;  Surgeon: Midge Minium, MD;  Location: [facility];  Service: Endoscopy;  Laterality: N/A;   CORONARY ARTERY BYPASS GRAFT N/A 08/14/2006   Procedure: CORONARY ARTERY BYPASS GRAFT; Location: [facility]; Surgeon: Tilford Pillar, MD   CYSTOSCOPY WITH RETROGRADE PYELOGRAM, URETEROSCOPY AND STENT PLACEMENT Right 11/12/2021   Procedure: CYSTOSCOPY WITH RETROGRADE PYELOGRAM / RIGHT URETEROSCOPY/HOLMIUM LASER/RIGHT STENT EXCHANGE;  Surgeon: Sondra Come, MD;  Location: [facility] OR[facility];  Service: Urology;  Laterality: Right;   CYSTOSCOPY/URETEROSCOPY/HOLMIUM LASER/STENT PLACEMENT Right 10/29/2021   Procedure: CYSTOSCOPY/ RIGHT STENT PLACEMENT;  Surgeon: Sondra Come, MD;  Location: [facility];  Service: Urology;  Laterality: Right;   ESOPHAGOGASTRODUODENOSCOPY (EGD) WITH PROPOFOL N/A 06/04/2020   Procedure: ESOPHAGOGASTRODUODENOSCOPY (EGD) WITH PROPOFOL;  Surgeon: Midge Minium, MD;  Location: [facility];  Service: Endoscopy;  Laterality: N/A;   FRACTURE SURGERY     ankle left   LEFT HEART CATH AND CORONARY ANGIOGRAPHY Left 08/14/2006   Procedure: LEFT HEART CATHETERIZATION AND CORONARY ANGIOGRAPHY; Location: [facility]: Surgeon: Rudean Hitt, MD   VAGINAL HYSTERECTOMY     DUB    Social History   Socioeconomic History   Marital status: Married    Spouse name: Caro Laroche   Number of children: 2   Years of education: Not on file   Highest education level: Some college, no degree  Occupational History    Employer: RETIRED  Tobacco Use   Smoking status: Never    Passive exposure: Never   Smokeless tobacco: Never  Vaping Use   Vaping status: Never Used  Substance and Sexual Activity   Alcohol use: No   Drug use: No   Sexual activity: Yes  Other Topics Concern   Not on file  Social History Narrative   Lives at home with husband.   Social Drivers of Corporate investment banker Strain: Low Risk  (04/06/2023)   Received from [facility]   Overall Financial Resource Strain (CARDIA)    Difficulty of Paying Living Expenses: Not hard at all  Food Insecurity: No Food Insecurity (04/06/2023)   Received from [facility]   Hunger Vital Sign    Worried About Running Out of Food in the Last Year: Never true    Ran Out of Food in the Last Year: Never true  Transportation Needs: No Transportation Needs (04/06/2023)   [facility]   PRAPARE - Transportation    In the past 12 months, has lack of transportation kept you from medical appointments or from getting medications?: No    Lack of Transportation (Non-Medical): No  Physical Activity: Sufficiently Active (02/09/2022)   Exercise Vital Sign    Days of Exercise per Week: 3 days    Minutes of Exercise per Session: 50 min  Stress: No Stress Concern Present (02/09/2022)   [employer] of Occupational Health - Occupational Stress Questionnaire    Feeling of Stress : Not at all  Social Connections: Moderately Integrated (02/09/2022)   Social Connection and Isolation Panel [NHANES]    Frequency of Communication with Friends and Family: More than three times a week    Frequency of Social Gatherings with  Friends and Family: Three times a week    Attends Religious Services: Never    Active Member of Clubs or Organizations: Yes    Attends Banker Meetings: More than 4 times per year    Marital Status: Married  Catering manager Violence: Not At Risk (02/23/2023)   Humiliation, Afraid, Rape, and Kick questionnaire    Fear of Current or Ex-Partner: No    Emotionally Abused: No    Physically Abused: No    Sexually Abused: No    Family History  Problem Relation Age of Onset   Heart attack Mother    Heart attack Maternal Uncle    Heart attack Maternal Uncle      Current Outpatient Medications:    ALPRAZolam (XANAX) 0.25 MG tablet, Take by mouth., Disp: , Rfl:    amLODipine (NORVASC) 5 MG tablet, Take 1 tablet (5 mg total) by mouth daily. Skip the dose if systolic BP less than 140 mmHg, Disp: 30 tablet, Rfl: 2   atorvastatin (LIPITOR) 80 MG tablet, TAKE (1) TABLET BY MOUTH EVERY DAY, Disp: 90 tablet, Rfl: 1   ferrous sulfate 325 (65 FE) MG tablet, Take 1 tablet (325 mg total) by mouth daily with breakfast., Disp: 90 tablet, Rfl: 1   hydrOXYzine (ATARAX) 10 MG tablet, Take 1 tablet (10 mg total) by mouth at bedtime as needed., Disp: 30 tablet, Rfl: 0   meloxicam (MOBIC) 7.5 MG tablet, TAKE (1) TABLET BY MOUTH EVERY DAY, Disp: 90 tablet, Rfl: 0   mirtazapine (REMERON) 15 MG tablet, Take 1 tablet by mouth at bedtime., Disp: , Rfl:    montelukast (SINGULAIR) 10 MG tablet, TAKE (1) TABLET BY MOUTH DAILY AT BEDTIME, Disp: 90 tablet, Rfl: 0   Nutritional Supplements (ENSURE COMPLETE PO), Take 1 Can by mouth daily., Disp: , Rfl:    potassium chloride SA (KLOR-CON M) 20 MEQ tablet, TAKE (1) TABLET BY MOUTH EVERY DAY, Disp: 90 tablet, Rfl: 1   Vitamin D, Ergocalciferol, (DRISDOL) 1.25 MG (50000 UNIT) CAPS capsule, Take 1 capsule (50,000 Units total) by mouth every 7 (seven) days., Disp: 12 capsule, Rfl: 0  Physical exam:  Vitals:   04/28/23 1044  BP: 122/64  Pulse: 70  Resp: 18   Temp: (!) 97.5 F (36.4 C)  TempSrc: Tympanic  SpO2: 100%  Weight: 95 lb (43.1 kg)   Physical Exam Constitutional:      Comments: Sitting in a wheelchair.  She is frail-appearing  Cardiovascular:     Rate and Rhythm: Normal rate and regular rhythm.     Heart sounds: Normal heart sounds.  Pulmonary:     Effort: Pulmonary effort is normal.     Breath sounds: Normal breath sounds.  Abdominal:     General: Bowel sounds are normal.     Palpations: Abdomen is soft.  Skin:    General: Skin is warm and dry.  Neurological:     Mental Status: She is alert and oriented to person, place, and time.         Latest Ref Rng & Units 04/28/2023   10:12 AM  CMP  Glucose 70 - 99 mg/dL 96   BUN 8 - 23 mg/dL 27   Creatinine 1.61 - 1.00 mg/dL 0.96   Sodium 045 - 409 mmol/L 140   Potassium 3.5 - 5.1 mmol/L 4.5   Chloride 98 - 111 mmol/L 102   CO2 22 - 32 mmol/L 28   Calcium 8.9 - 10.3 mg/dL 9.2   Total Protein 6.5 - 8.1 g/dL 7.0   Total Bilirubin 0.0 - 1.2 mg/dL 2.1   Alkaline Phos 38 - 126 U/L 55   AST 15 - 41 U/L 42   ALT 0 - 44 U/L 67       Latest Ref Rng & Units 04/28/2023   10:12 AM  CBC  WBC 4.0 - 10.5 K/uL 7.3   Hemoglobin 12.0 - 15.0 g/dL 81.1   Hematocrit 91.4 - 46.0 % 35.1   Platelets 150 - 400 K/uL 200     No images are attached to the encounter.  DG Ankle Complete Right Result Date: 04/26/2023 CLINICAL DATA:  Foot and ankle injury. EXAM: RIGHT ANKLE - COMPLETE 3+ VIEW COMPARISON:  None Available. FINDINGS: There is no evidence of fracture, dislocation, or joint effusion. There is no evidence of arthropathy or other focal bone abnormality. Soft tissues are unremarkable. IMPRESSION: Negative. Electronically Signed   By: Kennith Center M.D.   On: 04/26/2023 11:17   DG Foot Complete Right Result Date: 04/26/2023 CLINICAL DATA:  Foot and ankle injury. EXAM: RIGHT FOOT COMPLETE - 3+ VIEW COMPARISON:  None Available. FINDINGS: There is no evidence of fracture or dislocation.  There is no evidence of arthropathy or other focal bone abnormality. Soft tissues are unremarkable. IMPRESSION: Negative. Electronically Signed   By: Kennith Center M.D.   On: 04/26/2023 11:16     Assessment and plan- Patient is a 88 y.o. female with history of stage IIIc adenocarcinoma of the colon s/p 6 cycles of adjuvant Xeloda ending in December 2022 here for a routine follow-up appointment  Clinically patient is doing well with no concerning signs and symptoms of recurrence based on today's exam.  She has mildly abnormal AST and ALT which has remained stable over the last 1 year and she was seen by GI in the past as well and underwent ultrasound abdomen which was unremarkable.  Plan is to monitor her LFTs conservatively for now.  I will see her back in 6 months with CBC with differential CMP CEA with CT chest abdomen pelvis with contrast prior  History of B12 deficiency: Continue monthly B12 injections   Visit Diagnosis 1. Encounter for follow-up surveillance of colon cancer   2. B12 deficiency      Dr. Owens Shark, MD, MPH [facility] [phone] 04/28/2023 12:59 PM

## 2023-04-30 LAB — CEA: CEA: 7.1 ng/mL — ABNORMAL HIGH (ref 0.0–4.7)

## 2023-05-01 ENCOUNTER — Ambulatory Visit: Payer: Self-pay | Admitting: *Deleted

## 2023-05-01 NOTE — Patient Outreach (Signed)
  Care Coordination   Follow Up Visit Note   05/01/2023 Name: Phyllis Ross MRN: [id number] DOB: 03/02/1936  Phyllis Ross is a 88 y.o. year old female who sees Duanne Limerick, MD for primary care. I spoke with Judeth Cornfield, daughter of Phyllis Ross by phone today.  What matters to the patients health and wellness today?  Patient was seen in the ED last week due to ankle injury, but has been fine since.  Daughter report patient has been doing well over the past several weeks.  Denies any urgent concerns, encouraged to contact this care manager with questions.      Goals Addressed             This Visit's Progress    Effective management of health conditions   On track    Interventions Today    Flowsheet Row Most Recent Value  Chronic Disease   Chronic disease during today's visit Hypertension (HTN), Other  [CAD]  General Interventions   General Interventions Discussed/Reviewed General Interventions Reviewed, Doctor Visits, Level of Care  Doctor Visits Discussed/Reviewed Doctor Visits Reviewed, PCP, Specialist  [Reviewed upcoming: urology 3/5, cardiology 3/26, and PCP 4/2]  PCP/Specialist Visits Compliance with follow-up visit  [Seen by oncology and new PCP since last outreach]  Level of Care Personal Care Services  [Has 24/7 caregiver assistance in the home]  Exercise Interventions   Exercise Discussed/Reviewed Physical Activity  Physical Activity Discussed/Reviewed Physical Activity Reviewed  Education Interventions   Education Provided Provided Education  Provided Verbal Education On Medication, When to see the doctor  [facility] reviewed, report taking as instructed. Reminded of regular BP monitoring]              SDOH assessments and interventions completed:  No     Care Coordination Interventions:  Yes, provided   Follow up plan: Follow up call scheduled for 4/21    Encounter Outcome:  Patient Visit Completed   Rodney Langton, RN, MSN, CCM [place]   [facility], [facility] RN Care Coordinator Direct Dial: [phone] / Main [phone] Fax [phone] Email: [email or web address]

## 2023-05-01 NOTE — Patient Instructions (Signed)
Visit Information  Thank you for taking time to visit with me today. Please don't hesitate to contact me if I can be of assistance to you before our next scheduled telephone appointment.  Following are the goals we discussed today:  Continue attending appointments as scheduled.  Monitor blood pressure regularly.   Our next appointment is by telephone on 4/21  Please call the care guide team at [phone] if you need to cancel or reschedule your appointment.   Please call the Suicide and Crisis Lifeline: 988 call the [place] National Suicide Prevention Lifeline: 316-445-3482 or TTY: 709-466-9784 TTY 404-698-4400) to talk to a trained counselor call 1-800-273-TALK (toll free, 24 hour hotline) call 911 if you are experiencing a Mental Health or Behavioral Health Crisis or need someone to talk to.  Patient verbalizes understanding of instructions and care plan provided today and agrees to view in MyChart. Active MyChart status and patient understanding of how to access instructions and care plan via MyChart confirmed with patient.     The patient has been provided with contact information for the care management team and has been advised to call with any health related questions or concerns.   Rodney Langton, RN, MSN, CCM [facility], [facility] RN Care Coordinator Direct Dial: [phone] / Main [phone] Fax [phone] Email: [email or web address]

## 2023-05-10 DIAGNOSIS — D2271 Melanocytic nevi of right lower limb, including hip: Secondary | ICD-10-CM | POA: Diagnosis not present

## 2023-05-10 DIAGNOSIS — Z85828 Personal history of other malignant neoplasm of skin: Secondary | ICD-10-CM | POA: Diagnosis not present

## 2023-05-10 DIAGNOSIS — L538 Other specified erythematous conditions: Secondary | ICD-10-CM | POA: Diagnosis not present

## 2023-05-10 DIAGNOSIS — Z7189 Other specified counseling: Secondary | ICD-10-CM | POA: Diagnosis not present

## 2023-05-10 DIAGNOSIS — L821 Other seborrheic keratosis: Secondary | ICD-10-CM | POA: Diagnosis not present

## 2023-05-10 DIAGNOSIS — D225 Melanocytic nevi of trunk: Secondary | ICD-10-CM | POA: Diagnosis not present

## 2023-05-10 DIAGNOSIS — Z872 Personal history of diseases of the skin and subcutaneous tissue: Secondary | ICD-10-CM | POA: Diagnosis not present

## 2023-05-10 DIAGNOSIS — D485 Neoplasm of uncertain behavior of skin: Secondary | ICD-10-CM | POA: Diagnosis not present

## 2023-05-10 DIAGNOSIS — Z08 Encounter for follow-up examination after completed treatment for malignant neoplasm: Secondary | ICD-10-CM | POA: Diagnosis not present

## 2023-05-17 ENCOUNTER — Other Ambulatory Visit: Payer: Self-pay | Admitting: Family Medicine

## 2023-05-17 ENCOUNTER — Encounter: Payer: Self-pay | Admitting: Oncology

## 2023-05-17 DIAGNOSIS — M25552 Pain in left hip: Secondary | ICD-10-CM

## 2023-05-18 NOTE — Telephone Encounter (Signed)
I am only comfortable giving 30 days into your new primary care physician is able to review to see if this will be appropriate to continue.

## 2023-05-24 NOTE — Telephone Encounter (Signed)
 Cancel b12 injection appts please

## 2023-05-29 ENCOUNTER — Inpatient Hospital Stay: Payer: Medicare PPO

## 2023-06-02 ENCOUNTER — Other Ambulatory Visit: Payer: Self-pay

## 2023-06-02 DIAGNOSIS — N200 Calculus of kidney: Secondary | ICD-10-CM

## 2023-06-07 ENCOUNTER — Ambulatory Visit: Payer: Medicare PPO | Admitting: Urology

## 2023-06-07 ENCOUNTER — Ambulatory Visit
Admission: RE | Admit: 2023-06-07 | Discharge: 2023-06-07 | Disposition: A | Discharge status: Home / Self Care | Attending: Urology | Admitting: Urology

## 2023-06-07 ENCOUNTER — Encounter: Payer: Self-pay | Admitting: Urology

## 2023-06-07 ENCOUNTER — Ambulatory Visit
Admission: RE | Admit: 2023-06-07 | Discharge: 2023-06-07 | Disposition: A | Discharge status: Home / Self Care | Source: Ambulatory Visit | Attending: Urology

## 2023-06-07 VITALS — BP 94/62 | Ht 65.0 in | Wt 95.0 lb

## 2023-06-07 DIAGNOSIS — N200 Calculus of kidney: Secondary | ICD-10-CM | POA: Diagnosis not present

## 2023-06-07 NOTE — Progress Notes (Signed)
   06/07/2023 12:54 PM   Phyllis Ross 03-May-1935 [id number]   Reason for visit: Follow up nephrolithiasis  HPI: 88 year old female who underwent right-sided ureteroscopy and laser lithotripsy for a large 2 cm UPJ stone on 11/12/2021, and stent was removed 2 weeks later.  She has not had any problems since that time.  She denies any flank pain, gross hematuria, or UTIs.  I personally viewed and interpreted the CT from July 2024 that shows punctate right upper pole stones <64mm, as well as the KUB today with similar findings.  We discussed general stone prevention strategies including adequate hydration with goal of producing 2.5 L of urine daily, increasing citric acid intake, increasing calcium intake during high oxalate meals, minimizing animal protein, and decreasing salt intake. Information about dietary recommendations given today.   Follow-up with urology as needed   Sondra Come, MD  [facility] [address], [address] [place], [place] [address] [phone]

## 2023-06-15 ENCOUNTER — Other Ambulatory Visit: Payer: Self-pay | Admitting: Family Medicine

## 2023-06-15 DIAGNOSIS — E7849 Other hyperlipidemia: Secondary | ICD-10-CM

## 2023-06-15 DIAGNOSIS — D509 Iron deficiency anemia, unspecified: Secondary | ICD-10-CM

## 2023-06-26 ENCOUNTER — Inpatient Hospital Stay: Payer: Medicare PPO

## 2023-06-27 ENCOUNTER — Other Ambulatory Visit: Payer: Self-pay | Admitting: Family Medicine

## 2023-06-27 DIAGNOSIS — J45901 Unspecified asthma with (acute) exacerbation: Secondary | ICD-10-CM

## 2023-06-27 DIAGNOSIS — M25551 Pain in right hip: Secondary | ICD-10-CM

## 2023-06-28 ENCOUNTER — Other Ambulatory Visit: Payer: Self-pay | Admitting: Family Medicine

## 2023-06-28 DIAGNOSIS — M25551 Pain in right hip: Secondary | ICD-10-CM

## 2023-06-28 DIAGNOSIS — J45901 Unspecified asthma with (acute) exacerbation: Secondary | ICD-10-CM

## 2023-06-29 ENCOUNTER — Other Ambulatory Visit: Payer: Self-pay | Admitting: Family Medicine

## 2023-06-29 DIAGNOSIS — M25551 Pain in right hip: Secondary | ICD-10-CM

## 2023-06-30 NOTE — Telephone Encounter (Signed)
 Pt has new PCP  [facility]  [address]  [place], [place] [address]  [phone]  Wardell Heath, MD    Requested Prescriptions  Pending Prescriptions Disp Refills   meloxicam (MOBIC) 7.5 MG tablet [Pharmacy Med Name: MELOXICAM 7.5MG  TABLET] 30 tablet 0    Sig: TAKE (1) TABLET BY MOUTH EVERY DAY     Analgesics:  COX2 Inhibitors Failed - 06/30/2023  1:24 PM      Failed - Manual Review: Labs are only required if the patient has taken medication for more than 8 weeks.      Failed - HGB in normal range and within 360 days    Hemoglobin  Date Value Ref Range Status  04/28/2023 10.9 (L) 12.0 - 15.0 g/dL Final    Comment:    Reticulocyte Hemoglobin testing may be clinically indicated, consider ordering this additional test [id number]   03/14/2023 10.6 (L) 11.1 - 15.9 g/dL Final         Failed - HCT in normal range and within 360 days    HCT  Date Value Ref Range Status  04/28/2023 35.1 (L) 36.0 - 46.0 % Final   Hematocrit  Date Value Ref Range Status  03/14/2023 34.6 34.0 - 46.6 % Final         Failed - AST in normal range and within 360 days    AST  Date Value Ref Range Status  04/28/2023 42 (H) 15 - 41 U/L Final         Failed - ALT in normal range and within 360 days    ALT  Date Value Ref Range Status  04/28/2023 67 (H) 0 - 44 U/L Final   SGPT (ALT)  Date Value Ref Range Status  06/22/2012 26 12 - 78 U/L Final         Failed - Valid encounter within last 12 months    Recent Outpatient Visits   None            Passed - Cr in normal range and within 360 days    Creatinine  Date Value Ref Range Status  04/28/2023 0.79 0.44 - 1.00 mg/dL Final  08/65/7846 9.62 0.60 - 1.30 mg/dL Final         Passed - eGFR is 30 or above and within 360 days    EGFR (African American)  Date Value Ref Range Status  06/22/2012 52 (L)  Final   GFR calc Af Amer  Date Value Ref Range Status  04/07/2020 60 >59 mL/min/1.73 Final    Comment:    **In  accordance with recommendations from the NKF-ASN Task force,**   [facility] is in the process of updating its eGFR calculation to the   2021 CKD-EPI creatinine equation that estimates kidney function   without a race variable.    EGFR (Non-African Amer.)  Date Value Ref Range Status  06/22/2012 45 (L)  Final    Comment:    eGFR values <78mL/min/1.73 m2 may be an indication of chronic kidney disease (CKD). Calculated eGFR is useful in patients with stable renal function. The eGFR calculation will not be reliable in acutely ill patients when serum creatinine is changing rapidly. It is not useful in  patients on dialysis. The eGFR calculation may not be applicable to patients at the low and high extremes of body sizes, pregnant women, and vegetarians.    GFR, Estimated  Date Value Ref Range Status  04/28/2023 >60 >60 mL/min Final    Comment:    (  NOTE) Calculated using the CKD-EPI Creatinine Equation (2021)    eGFR  Date Value Ref Range Status  03/14/2023 66 >59 mL/min/1.73 Final         Passed - Patient is not pregnant

## 2023-07-05 ENCOUNTER — Telehealth: Payer: Self-pay | Admitting: *Deleted

## 2023-07-05 NOTE — Patient Outreach (Signed)
 Care Coordination   Follow Up Visit Note   07/05/2023 Name: RASA DEGRAZIA MRN: [id number] DOB: [birth year]  LANETT LASORSA is a 88 y.o. year old female who sees Carmelina Dane, MD for primary care. I spoke with  Judeth Cornfield, daughter of BUNNY KLEIST by phone today.  What matters to the patients health and wellness today?  Daughter report patient is doing well, received good report from PCP today.  Denies any urgent concerns, encouraged to contact this care manager with questions.      Goals Addressed             This Visit's Progress    COMPLETED: Effective management of health conditions   On track    Interventions Today    Flowsheet Row Most Recent Value  Chronic Disease   Chronic disease during today's visit Hypertension (HTN), Other  [CAD]  General Interventions   General Interventions Discussed/Reviewed General Interventions Reviewed, Doctor Visits  Doctor Visits Discussed/Reviewed Doctor Visits Reviewed, PCP  PCP/Specialist Visits Compliance with follow-up visit  [Seen by PCP today]  Education Interventions   Education Provided Provided Education  Provided Verbal Education On Medication, When to see the doctor  [meds reviewed, no changes, daughter report compliance]              SDOH assessments and interventions completed:  No     Care Coordination Interventions:  Yes, provided   Follow up plan: No further intervention required.   Encounter Outcome:  Patient Visit Completed   Rodney Langton, RN, MSN, CCM [place]  [address] [facility], [facility] RN Care Coordinator Direct Dial: [phone] / Main [phone] Fax [phone] Email: [email or web address]

## 2023-07-10 ENCOUNTER — Other Ambulatory Visit: Payer: Self-pay | Admitting: Family Medicine

## 2023-07-10 DIAGNOSIS — J45901 Unspecified asthma with (acute) exacerbation: Secondary | ICD-10-CM

## 2023-07-10 DIAGNOSIS — M25551 Pain in right hip: Secondary | ICD-10-CM

## 2023-07-11 ENCOUNTER — Other Ambulatory Visit: Payer: Self-pay | Admitting: Family Medicine

## 2023-07-11 DIAGNOSIS — J45901 Unspecified asthma with (acute) exacerbation: Secondary | ICD-10-CM

## 2023-07-11 DIAGNOSIS — M25551 Pain in right hip: Secondary | ICD-10-CM

## 2023-07-24 ENCOUNTER — Encounter: Payer: Medicare PPO | Admitting: *Deleted

## 2023-07-27 ENCOUNTER — Inpatient Hospital Stay: Payer: Medicare PPO

## 2023-08-25 ENCOUNTER — Inpatient Hospital Stay: Payer: Medicare PPO

## 2023-09-25 ENCOUNTER — Inpatient Hospital Stay: Payer: Medicare PPO

## 2023-10-09 ENCOUNTER — Ambulatory Visit
Admission: RE | Admit: 2023-10-09 | Discharge: 2023-10-09 | Disposition: A | Discharge status: Home / Self Care | Payer: Medicare PPO | Source: Ambulatory Visit | Attending: Oncology | Admitting: Oncology

## 2023-10-09 DIAGNOSIS — Z85038 Personal history of other malignant neoplasm of large intestine: Secondary | ICD-10-CM | POA: Diagnosis present

## 2023-10-09 DIAGNOSIS — Z08 Encounter for follow-up examination after completed treatment for malignant neoplasm: Secondary | ICD-10-CM | POA: Diagnosis present

## 2023-10-09 LAB — POCT I-STAT CREATININE: Creatinine, Ser: 0.9 mg/dL (ref 0.44–1.00)

## 2023-10-09 MED ORDER — IOHEXOL 300 MG/ML  SOLN
80.0000 mL | Freq: Once | INTRAMUSCULAR | Status: AC | PRN
  Administered 2023-10-09: 80 mL via INTRAVENOUS

## 2023-10-09 MED ORDER — BARIUM SULFATE 2 % PO SUSP
450.0000 mL | ORAL | Status: AC
  Administered 2023-10-09 (×2): 450 mL via ORAL

## 2023-10-25 ENCOUNTER — Other Ambulatory Visit: Payer: Medicare PPO

## 2023-10-25 ENCOUNTER — Ambulatory Visit: Payer: Medicare PPO | Admitting: Oncology

## 2023-10-25 ENCOUNTER — Inpatient Hospital Stay: Payer: Medicare PPO

## 2023-11-08 ENCOUNTER — Inpatient Hospital Stay: Attending: Oncology

## 2023-11-08 ENCOUNTER — Inpatient Hospital Stay

## 2023-11-08 ENCOUNTER — Inpatient Hospital Stay: Admitting: Oncology

## 2023-11-08 ENCOUNTER — Encounter: Payer: Self-pay | Admitting: Oncology

## 2023-11-08 DIAGNOSIS — D561 Beta thalassemia: Secondary | ICD-10-CM | POA: Diagnosis not present

## 2023-11-08 DIAGNOSIS — Z08 Encounter for follow-up examination after completed treatment for malignant neoplasm: Secondary | ICD-10-CM | POA: Diagnosis present

## 2023-11-08 DIAGNOSIS — D509 Iron deficiency anemia, unspecified: Secondary | ICD-10-CM | POA: Diagnosis not present

## 2023-11-08 DIAGNOSIS — Z85038 Personal history of other malignant neoplasm of large intestine: Secondary | ICD-10-CM | POA: Diagnosis not present

## 2023-11-08 LAB — CBC WITH DIFFERENTIAL (CANCER CENTER ONLY)
Abs Immature Granulocytes: 0.02 K/uL (ref 0.00–0.07)
Basophils Absolute: 0 K/uL (ref 0.0–0.1)
Basophils Relative: 1 %
Eosinophils Absolute: 0.1 K/uL (ref 0.0–0.5)
Eosinophils Relative: 1 %
HCT: 33.9 % — ABNORMAL LOW (ref 36.0–46.0)
Hemoglobin: 10.4 g/dL — ABNORMAL LOW (ref 12.0–15.0)
Immature Granulocytes: 0 %
Lymphocytes Relative: 21 %
Lymphs Abs: 1.5 K/uL (ref 0.7–4.0)
MCH: 21.4 pg — ABNORMAL LOW (ref 26.0–34.0)
MCHC: 30.7 g/dL (ref 30.0–36.0)
MCV: 69.8 fL — ABNORMAL LOW (ref 80.0–100.0)
Monocytes Absolute: 0.7 K/uL (ref 0.1–1.0)
Monocytes Relative: 9 %
Neutro Abs: 4.8 K/uL (ref 1.7–7.7)
Neutrophils Relative %: 68 %
Platelet Count: 185 K/uL (ref 150–400)
RBC: 4.86 MIL/uL (ref 3.87–5.11)
RDW: 15.2 % (ref 11.5–15.5)
WBC Count: 7.1 K/uL (ref 4.0–10.5)
nRBC: 0 % (ref 0.0–0.2)

## 2023-11-08 LAB — CMP (CANCER CENTER ONLY)
ALT: 36 U/L (ref 0–44)
AST: 27 U/L (ref 15–41)
Albumin: 3.8 g/dL (ref 3.5–5.0)
Alkaline Phosphatase: 69 U/L (ref 38–126)
Anion gap: 8 (ref 5–15)
BUN: 29 mg/dL — ABNORMAL HIGH (ref 8–23)
CO2: 30 mmol/L (ref 22–32)
Calcium: 9.3 mg/dL (ref 8.9–10.3)
Chloride: 101 mmol/L (ref 98–111)
Creatinine: 0.7 mg/dL (ref 0.44–1.00)
GFR, Estimated: >60 mL/min (ref >60)
Glucose, Bld: 109 mg/dL — ABNORMAL HIGH (ref 70–99)
Potassium: 4 mmol/L (ref 3.5–5.1)
Sodium: 139 mmol/L (ref 135–145)
Total Bilirubin: 1.7 mg/dL — ABNORMAL HIGH (ref 0.0–1.2)
Total Protein: 6.7 g/dL (ref 6.5–8.1)

## 2023-11-08 NOTE — Progress Notes (Signed)
 Patient is having a stressful situation with her husband health and new situation at home. She request a refill for her Xanax 

## 2023-11-09 LAB — CEA: CEA: 5.6 ng/mL — ABNORMAL HIGH (ref 0.0–4.7)

## 2023-11-10 NOTE — Progress Notes (Signed)
 Hematology/Oncology Consult note [facility]  Telephone:([phone] [phone]  Patient Care Team: Salli Amato, MD as PCP - General (Internal Medicine) Hester Wolm PARAS, MD as Consulting Physician (Cardiology) Maurie Rayfield BIRCH, RN as Oncology Nurse Navigator Lane Shope, MD as Consulting Physician (General Surgery) Melanee Annah BROCKS, MD as Consulting Physician (Oncology) Francisca Redell BROCKS, MD as Consulting Physician (Urology)   Name of the patient: Phyllis Ross  [id number]  1936-02-12   Date of visit: 11/10/23  Diagnosis- stage III cecal adenocarcinoma currently in remission     Chief complaint/ Reason for visit-routine follow-up of colon cancer  Heme/Onc history:  Patient is a 88 year old female who was 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  She is s/p laparoscopic right hemicolectomy.  Pathology showed 5.2 x 4.3 x 2.7 cm grade 2 moderately differentiated adenocarcinoma with invasion through muscularis propria into pericolorectal tissue.  7 of 27 lymph nodes positive.  Lymphovascular invasion present but no perineural invasion.  MMR negative.  T3N2BMX.  Right hemicolectomy complicated by anastomotic breakdown/leak which was treated with antibiotics.  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  Also has a history of baseline microcytic anemia secondary to beta thalassemia and baseline hemoglobin is between 10-11.   Patient has met with Dr. Rudell in the past management chemotherapy options including oral Xeloda  has been offered to the patient   Patient completed 8 cycles of adjuvant Xeloda  in November 2022.  Patient also has a history of B12 deficiency.  She is presently on oral B12  Interval history-patient is doing well for her age.  No recent hospitalizations. And appetite or weight.  Denies  any abdominal pain. Denies any changes in she is however having to take care of her husband who was recently hospitalized.  ECOG PS- 2 Pain scale- 0   Review of systems- Review of Systems  Constitutional:  Positive for malaise/fatigue. Negative for chills, fever and weight loss.  HENT:  Negative for congestion, ear discharge and nosebleeds.   Eyes:  Negative for blurred vision.  Respiratory:  Negative for cough, hemoptysis, sputum production, shortness of breath and wheezing.   Cardiovascular:  Negative for chest pain, palpitations, orthopnea and claudication.  Gastrointestinal:  Negative for abdominal pain, blood in stool, constipation, diarrhea, heartburn, melena, nausea and vomiting.  Genitourinary:  Negative for dysuria, flank pain, frequency, hematuria and urgency.  Musculoskeletal:  Negative for back pain, joint pain and myalgias.  Skin:  Negative for rash.  Neurological:  Negative for dizziness, tingling, focal weakness, seizures, weakness and headaches.  Endo/Heme/Allergies:  Does not bruise/bleed easily.  Psychiatric/Behavioral:  Negative for depression and suicidal ideas. The patient does not have insomnia.       Allergies  Allergen Reactions   Trazodone  And Nefazodone Other (See Comments)    Made her pass out     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   a.) stage IIIc (G2, (+) LVI, (-) PNI, T3N3BMX) --> s/p RIGHT hemicolectomy + oral capecitabine    Anemia    Anxiety    a.) on BZO (alprazolam ) PRN   Aortic atherosclerosis (HCC)    Arthritis    Beta thalassemia (HCC)    Bradycardia    CAD (coronary artery disease) 08/14/2006   a.) STEMI 08/14/2006 --> LHC at [facility] --> EF 45%, 95% mLAD, 95% OM1,  and 75% LV groove artery --> transfer to [facility] for CVTS consult; b.) 3v CABG 08/14/2006   Chronic kidney disease    History of kidney stones    Hyperlipidemia    Hypertension    Osteoporosis    Ovarian abscess    Ovarian cancer (HCC)  2005   a.) stage IIIb adenocarcinoma --> s/p CRS 2005 + 6 cycles of carboplatin/paclitaxel   Pneumonia    as a baby   Rheumatic fever    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   a.) LIMA-LAD, SVG-RI, SVG-OM1   Skin cancer    STEMI (ST elevation myocardial infarction) (HCC) 08/14/2006   a.) LHC at [facility] --> transferred to [facility] for emergent CABG     Past Surgical History:  Procedure Laterality Date   COLONOSCOPY  04/04/2009   normal- Dr Viktoria   COLONOSCOPY WITH PROPOFOL  N/A 06/04/2020   Procedure: COLONOSCOPY WITH PROPOFOL ;  Surgeon: Jinny Carmine, MD;  Location: [facility];  Service: Endoscopy;  Laterality: N/A;   COLONOSCOPY WITH PROPOFOL  N/A 01/31/2022   Procedure: COLONOSCOPY WITH PROPOFOL ;  Surgeon: Jinny Carmine, MD;  Location: [facility];  Service: Endoscopy;  Laterality: N/A;   CORONARY ARTERY BYPASS GRAFT N/A 08/14/2006   Procedure: CORONARY ARTERY BYPASS GRAFT; Location: [facility]; Surgeon: Lynwood Cook, MD   CYSTOSCOPY WITH RETROGRADE PYELOGRAM, URETEROSCOPY AND STENT PLACEMENT Right 11/12/2021   Procedure: CYSTOSCOPY WITH RETROGRADE PYELOGRAM / RIGHT URETEROSCOPY/HOLMIUM LASER/RIGHT STENT EXCHANGE;  Surgeon: Francisca Redell BROCKS, MD;  Location: [facility];  Service: Urology;  Laterality: Right;   CYSTOSCOPY/URETEROSCOPY/HOLMIUM LASER/STENT PLACEMENT Right 10/29/2021   Procedure: CYSTOSCOPY/ RIGHT STENT PLACEMENT;  Surgeon: Francisca Redell BROCKS, MD;  Location: [facility];  Service: Urology;  Laterality: Right;   ESOPHAGOGASTRODUODENOSCOPY (EGD) WITH PROPOFOL  N/A 06/04/2020   Procedure: ESOPHAGOGASTRODUODENOSCOPY (EGD) WITH PROPOFOL ;  Surgeon: Jinny Carmine, MD;  Location: [facility];  Service: Endoscopy;  Laterality: N/A;   FRACTURE SURGERY     ankle left   LEFT HEART CATH AND CORONARY ANGIOGRAPHY Left 08/14/2006   Procedure: LEFT HEART CATHETERIZATION AND CORONARY ANGIOGRAPHY; Location: [facility]: Surgeon: Margie Lovelace, MD   VAGINAL HYSTERECTOMY     DUB    Social History    Socioeconomic History   Marital status: Married    Spouse name: Grady   Number of children: 2   Years of education: Not on file   Highest education level: Some college, no degree  Occupational History    Employer: RETIRED  Tobacco Use   Smoking status: Never    Passive exposure: Never   Smokeless tobacco: Never  Vaping Use   Vaping status: Never Used  Substance and Sexual Activity   Alcohol use: No   Drug use: No   Sexual activity: Yes  Other Topics Concern   Not on file  Social History Narrative   Lives at home with husband.   Social Drivers of Corporate investment banker Strain: Low Risk  (04/06/2023)   Received from [facility]   Overall Financial Resource Strain (CARDIA)    Difficulty of Paying Living Expenses: Not hard at all  Food Insecurity: No Food Insecurity (04/06/2023)   Received from [facility]   Hunger Vital Sign    Within the past 12 months, you worried that your food would run out before you got the money to buy more.: Never true    Within the past 12 months, the food you bought just didn't last and you didn't have money to get more.: Never  true  Transportation Needs: No Transportation Needs (04/06/2023)   Received from [facility] - Transportation    In the past 12 months, has lack of transportation kept you from medical appointments or from getting medications?: No    Lack of Transportation (Non-Medical): No  Physical Activity: Sufficiently Active (02/09/2022)   Exercise Vital Sign    Days of Exercise per Week: 3 days    Minutes of Exercise per Session: 50 min  Stress: No Stress Concern Present (02/09/2022)   [employer] of Occupational Health - Occupational Stress Questionnaire    Feeling of Stress : Not at all  Social Connections: Moderately Integrated (02/09/2022)   Social Connection and Isolation Panel    Frequency of Communication with Friends and Family: More than three times a  week    Frequency of Social Gatherings with Friends and Family: Three times a week    Attends Religious Services: Never    Active Member of Clubs or Organizations: Yes    Attends Banker Meetings: More than 4 times per year    Marital Status: Married  Catering manager Violence: Not At Risk (02/23/2023)   Humiliation, Afraid, Rape, and Kick questionnaire    Fear of Current or Ex-Partner: No    Emotionally Abused: No    Physically Abused: No    Sexually Abused: No    Family History  Problem Relation Age of Onset   Heart attack Mother    Heart attack Maternal Uncle    Heart attack Maternal Uncle      Current Outpatient Medications:    ALPRAZolam  (XANAX ) 0.25 MG tablet, Take by mouth., Disp: , Rfl:    atorvastatin  (LIPITOR) 80 MG tablet, TAKE (1) TABLET BY MOUTH EVERY DAY, Disp: 90 tablet, Rfl: 1   FEROSUL 325 (65 Fe) MG tablet, TAKE ONE (1) TABLET (325 MG TOTAL) BY MOUTH DAILY WITH BREAKFAST., Disp: 90 tablet, Rfl: 1   hydrOXYzine  (ATARAX ) 10 MG tablet, Take 1 tablet (10 mg total) by mouth at bedtime as needed., Disp: 30 tablet, Rfl: 0   meloxicam  (MOBIC ) 7.5 MG tablet, TAKE (1) TABLET BY MOUTH EVERY DAY, Disp: 30 tablet, Rfl: 0   mirtazapine (REMERON) 15 MG tablet, Take 1 tablet by mouth at bedtime., Disp: , Rfl:    montelukast  (SINGULAIR ) 10 MG tablet, TAKE (1) TABLET BY MOUTH DAILY AT BEDTIME, Disp: 90 tablet, Rfl: 0   Nutritional Supplements (ENSURE COMPLETE PO), Take 1 Can by mouth daily., Disp: , Rfl:    potassium chloride  SA (KLOR-CON  M) 20 MEQ tablet, TAKE (1) TABLET BY MOUTH EVERY DAY, Disp: 90 tablet, Rfl: 1  Physical exam:  Vitals:   11/08/23 1406  BP: (!) 147/64  Pulse: 66  Resp: 16  Temp: (!) 96.5 F (35.8 C)  TempSrc: Tympanic  SpO2: 98%   Physical Exam Constitutional:      Comments: Thin elderly female sitting in a wheelchair.  Appears in no acute distress  Cardiovascular:     Rate and Rhythm: Normal rate and regular rhythm.     Heart sounds:  Normal heart sounds.  Pulmonary:     Effort: Pulmonary effort is normal.     Breath sounds: Normal breath sounds.  Abdominal:     General: Bowel sounds are normal.     Palpations: Abdomen is soft.  Skin:    General: Skin is warm and dry.  Neurological:     Mental Status: She is alert and oriented to person, place, and time.  I have personally reviewed labs listed below:    Latest Ref Rng & Units 11/08/2023    1:51 PM  CMP  Glucose 70 - 99 mg/dL 890   BUN 8 - 23 mg/dL 29   Creatinine 9.55 - 1.00 mg/dL 9.29   Sodium 864 - 854 mmol/L 139   Potassium 3.5 - 5.1 mmol/L 4.0   Chloride 98 - 111 mmol/L 101   CO2 22 - 32 mmol/L 30   Calcium  8.9 - 10.3 mg/dL 9.3   Total Protein 6.5 - 8.1 g/dL 6.7   Total Bilirubin 0.0 - 1.2 mg/dL 1.7   Alkaline Phos 38 - 126 U/L 69   AST 15 - 41 U/L 27   ALT 0 - 44 U/L 36       Latest Ref Rng & Units 11/08/2023    1:51 PM  CBC  WBC 4.0 - 10.5 K/uL 7.1   Hemoglobin 12.0 - 15.0 g/dL 89.5   Hematocrit 63.9 - 46.0 % 33.9   Platelets 150 - 400 K/uL 185     Assessment and plan- Patient is a 88 y.o. female here for a routine surveillance visit for colon cancer   I have reviewed CT chest abdomen and pelvis images from 10/09/2023 independently and discussed findings with the patient which does not show any evidence of recurrent or progressive disease.  We are now nearing 3 years from the time she has completed adjuvant chemotherapy.  I will plan to get repeat CT scans in 6 months and see her 2 weeks after scans.  Following that I will plan to do her scans on a yearly basis.  CEA is stable around 5-6.  Patient previously had abnormal LFTs which have now resolved.  She has baseline microcytic anemia secondary to beta thalassemia trait and her hemoglobin remains stable around 10.5.   Visit Diagnosis 1. Encounter for follow-up surveillance of colon cancer      Dr. Annah Skene, MD, MPH [facility] [phone] 11/10/2023 9:21  AM

## 2023-11-11 ENCOUNTER — Encounter: Payer: Self-pay | Admitting: Oncology

## 2023-11-23 ENCOUNTER — Encounter: Payer: Self-pay | Admitting: Oncology

## 2024-05-15 ENCOUNTER — Ambulatory Visit: Admitting: Oncology

## 2024-05-15 ENCOUNTER — Other Ambulatory Visit

## 2024-11-07 ENCOUNTER — Other Ambulatory Visit
